# Patient Record
Sex: Female | Born: 1955 | Race: Black or African American | Hispanic: No | Marital: Married | Smoking: Never smoker
Health system: Southern US, Community
[De-identification: ages and names within clinical notes are randomized; demographics above are authoritative.]

## PROBLEM LIST (undated history)

## (undated) DIAGNOSIS — K589 Irritable bowel syndrome without diarrhea: Secondary | ICD-10-CM

## (undated) DIAGNOSIS — M1990 Unspecified osteoarthritis, unspecified site: Secondary | ICD-10-CM

## (undated) DIAGNOSIS — E785 Hyperlipidemia, unspecified: Secondary | ICD-10-CM

## (undated) DIAGNOSIS — K8020 Calculus of gallbladder without cholecystitis without obstruction: Secondary | ICD-10-CM

## (undated) DIAGNOSIS — Z9889 Other specified postprocedural states: Secondary | ICD-10-CM

## (undated) DIAGNOSIS — R519 Headache, unspecified: Secondary | ICD-10-CM

## (undated) DIAGNOSIS — M8580 Other specified disorders of bone density and structure, unspecified site: Secondary | ICD-10-CM

## (undated) DIAGNOSIS — E039 Hypothyroidism, unspecified: Secondary | ICD-10-CM

## (undated) DIAGNOSIS — K635 Polyp of colon: Secondary | ICD-10-CM

## (undated) DIAGNOSIS — F329 Major depressive disorder, single episode, unspecified: Secondary | ICD-10-CM

## (undated) DIAGNOSIS — C50919 Malignant neoplasm of unspecified site of unspecified female breast: Secondary | ICD-10-CM

## (undated) DIAGNOSIS — E669 Obesity, unspecified: Secondary | ICD-10-CM

## (undated) DIAGNOSIS — R42 Dizziness and giddiness: Secondary | ICD-10-CM

## (undated) DIAGNOSIS — K224 Dyskinesia of esophagus: Secondary | ICD-10-CM

## (undated) DIAGNOSIS — M7070 Other bursitis of hip, unspecified hip: Secondary | ICD-10-CM

## (undated) DIAGNOSIS — G629 Polyneuropathy, unspecified: Secondary | ICD-10-CM

## (undated) DIAGNOSIS — R112 Nausea with vomiting, unspecified: Secondary | ICD-10-CM

## (undated) DIAGNOSIS — D649 Anemia, unspecified: Secondary | ICD-10-CM

## (undated) DIAGNOSIS — K219 Gastro-esophageal reflux disease without esophagitis: Secondary | ICD-10-CM

## (undated) DIAGNOSIS — F419 Anxiety disorder, unspecified: Secondary | ICD-10-CM

## (undated) DIAGNOSIS — F32A Depression, unspecified: Secondary | ICD-10-CM

## (undated) DIAGNOSIS — G8929 Other chronic pain: Secondary | ICD-10-CM

## (undated) DIAGNOSIS — I10 Essential (primary) hypertension: Secondary | ICD-10-CM

## (undated) DIAGNOSIS — K602 Anal fissure, unspecified: Secondary | ICD-10-CM

## (undated) DIAGNOSIS — R51 Headache: Secondary | ICD-10-CM

## (undated) DIAGNOSIS — M797 Fibromyalgia: Secondary | ICD-10-CM

## (undated) HISTORY — DX: Other bursitis of hip, unspecified hip: M70.70

## (undated) HISTORY — DX: Anemia, unspecified: D64.9

## (undated) HISTORY — DX: Anal fissure, unspecified: K60.2

## (undated) HISTORY — DX: Dyskinesia of esophagus: K22.4

## (undated) HISTORY — DX: Headache, unspecified: R51.9

## (undated) HISTORY — DX: Depression, unspecified: F32.A

## (undated) HISTORY — DX: Major depressive disorder, single episode, unspecified: F32.9

## (undated) HISTORY — DX: Calculus of gallbladder without cholecystitis without obstruction: K80.20

## (undated) HISTORY — DX: Unspecified osteoarthritis, unspecified site: M19.90

## (undated) HISTORY — PX: GASTRIC BYPASS: SHX52

## (undated) HISTORY — DX: Irritable bowel syndrome, unspecified: K58.9

## (undated) HISTORY — DX: Anxiety disorder, unspecified: F41.9

## (undated) HISTORY — PX: CHOLECYSTECTOMY: SHX55

## (undated) HISTORY — DX: Polyneuropathy, unspecified: G62.9

## (undated) HISTORY — DX: Dizziness and giddiness: R42

## (undated) HISTORY — DX: Gastro-esophageal reflux disease without esophagitis: K21.9

## (undated) HISTORY — DX: Headache: R51

## (undated) HISTORY — PX: OTHER SURGICAL HISTORY: SHX169

## (undated) HISTORY — DX: Hyperlipidemia, unspecified: E78.5

## (undated) HISTORY — PX: TUBAL LIGATION: SHX77

## (undated) HISTORY — DX: Other chronic pain: G89.29

## (undated) HISTORY — DX: Essential (primary) hypertension: I10

## (undated) HISTORY — DX: Obesity, unspecified: E66.9

## (undated) HISTORY — DX: Malignant neoplasm of unspecified site of unspecified female breast: C50.919

## (undated) HISTORY — DX: Other specified postprocedural states: Z98.890

## (undated) HISTORY — PX: APPENDECTOMY: SHX54

## (undated) HISTORY — DX: Polyp of colon: K63.5

## (undated) HISTORY — PX: CARPAL TUNNEL RELEASE: SHX101

## (undated) HISTORY — DX: Other specified disorders of bone density and structure, unspecified site: M85.80

---

## 2005-05-02 DIAGNOSIS — Z9889 Other specified postprocedural states: Secondary | ICD-10-CM

## 2005-05-02 HISTORY — DX: Other specified postprocedural states: Z98.890

## 2007-11-27 ENCOUNTER — Encounter: Payer: Self-pay | Admitting: Gastroenterology

## 2008-10-29 ENCOUNTER — Encounter: Admission: RE | Admit: 2008-10-29 | Discharge: 2008-10-29 | Payer: Self-pay

## 2008-10-29 ENCOUNTER — Ambulatory Visit: Payer: Self-pay | Admitting: Oncology

## 2008-11-25 LAB — CBC WITH DIFFERENTIAL (CANCER CENTER ONLY)
BASO%: 0.5 % (ref 0.0–2.0)
EOS%: 2 % (ref 0.0–7.0)
HCT: 36.5 % (ref 34.8–46.6)
LYMPH#: 1.1 10*3/uL (ref 0.9–3.3)
MCHC: 34.5 g/dL (ref 32.0–36.0)
NEUT#: 2.6 10*3/uL (ref 1.5–6.5)
NEUT%: 64.5 % (ref 39.6–80.0)
Platelets: 213 10*3/uL (ref 145–400)
RDW: 11.3 % (ref 10.5–14.6)
WBC: 4 10*3/uL (ref 3.9–10.0)

## 2008-11-25 LAB — MORPHOLOGY - CHCC SATELLITE: Platelet Morphology: NORMAL

## 2008-11-27 ENCOUNTER — Ambulatory Visit: Payer: Self-pay | Admitting: Cardiology

## 2008-11-27 ENCOUNTER — Observation Stay (HOSPITAL_COMMUNITY): Admission: EM | Admit: 2008-11-27 | Discharge: 2008-11-28 | Payer: Self-pay | Admitting: Emergency Medicine

## 2008-11-27 LAB — COMPREHENSIVE METABOLIC PANEL
Alkaline Phosphatase: 83 U/L (ref 39–117)
BUN: 12 mg/dL (ref 6–23)
CO2: 24 mEq/L (ref 19–32)
Creatinine, Ser: 0.76 mg/dL (ref 0.40–1.20)
Glucose, Bld: 155 mg/dL — ABNORMAL HIGH (ref 70–99)
Total Bilirubin: 0.5 mg/dL (ref 0.3–1.2)

## 2008-11-27 LAB — VITAMIN B12: Vitamin B-12: 203 pg/mL — ABNORMAL LOW (ref 211–911)

## 2008-11-27 LAB — HEMOGLOBINOPATHY EVALUATION
Hemoglobin Other: 0 % (ref 0.0–0.0)
Hgb A2 Quant: 2.8 % (ref 2.2–3.2)
Hgb A: 97.2 % (ref 96.8–97.8)
Hgb F Quant: 0 % (ref 0.0–2.0)
Hgb S Quant: 0 % (ref 0.0–0.0)

## 2008-11-27 LAB — IRON AND TIBC
%SAT: 45 % (ref 20–55)
Iron: 144 ug/dL (ref 42–145)
TIBC: 320 ug/dL (ref 250–470)
UIBC: 176 ug/dL

## 2008-11-27 LAB — FERRITIN: Ferritin: 72 ng/mL (ref 10–291)

## 2008-11-28 ENCOUNTER — Encounter (INDEPENDENT_AMBULATORY_CARE_PROVIDER_SITE_OTHER): Payer: Self-pay | Admitting: *Deleted

## 2008-11-28 ENCOUNTER — Encounter (INDEPENDENT_AMBULATORY_CARE_PROVIDER_SITE_OTHER): Payer: Self-pay | Admitting: Internal Medicine

## 2008-11-28 ENCOUNTER — Encounter: Payer: Self-pay | Admitting: Gastroenterology

## 2008-12-11 ENCOUNTER — Ambulatory Visit: Payer: Self-pay | Admitting: Oncology

## 2008-12-16 ENCOUNTER — Encounter: Payer: Self-pay | Admitting: Family Medicine

## 2009-01-07 ENCOUNTER — Ambulatory Visit: Payer: Self-pay | Admitting: Oncology

## 2009-01-21 ENCOUNTER — Ambulatory Visit: Payer: Self-pay | Admitting: Gastroenterology

## 2009-01-21 DIAGNOSIS — E119 Type 2 diabetes mellitus without complications: Secondary | ICD-10-CM | POA: Insufficient documentation

## 2009-01-21 DIAGNOSIS — R1013 Epigastric pain: Secondary | ICD-10-CM

## 2009-01-21 DIAGNOSIS — K5900 Constipation, unspecified: Secondary | ICD-10-CM | POA: Insufficient documentation

## 2009-01-21 DIAGNOSIS — R1310 Dysphagia, unspecified: Secondary | ICD-10-CM | POA: Insufficient documentation

## 2009-01-21 DIAGNOSIS — K3189 Other diseases of stomach and duodenum: Secondary | ICD-10-CM | POA: Insufficient documentation

## 2009-01-23 ENCOUNTER — Ambulatory Visit (HOSPITAL_COMMUNITY): Admission: RE | Admit: 2009-01-23 | Discharge: 2009-01-23 | Payer: Self-pay | Admitting: Gastroenterology

## 2009-01-23 ENCOUNTER — Ambulatory Visit: Payer: Self-pay | Admitting: Gastroenterology

## 2009-01-26 ENCOUNTER — Telehealth: Payer: Self-pay | Admitting: Gastroenterology

## 2009-02-04 ENCOUNTER — Ambulatory Visit (HOSPITAL_COMMUNITY): Admission: RE | Admit: 2009-02-04 | Discharge: 2009-02-04 | Payer: Self-pay | Admitting: Gastroenterology

## 2009-02-06 ENCOUNTER — Ambulatory Visit: Payer: Self-pay | Admitting: Oncology

## 2009-02-18 ENCOUNTER — Ambulatory Visit: Payer: Self-pay | Admitting: Gastroenterology

## 2009-02-18 ENCOUNTER — Telehealth: Payer: Self-pay | Admitting: Gastroenterology

## 2009-02-23 ENCOUNTER — Telehealth: Payer: Self-pay | Admitting: Gastroenterology

## 2009-02-23 ENCOUNTER — Ambulatory Visit (HOSPITAL_COMMUNITY): Admission: RE | Admit: 2009-02-23 | Discharge: 2009-02-23 | Payer: Self-pay | Admitting: Gastroenterology

## 2009-02-24 ENCOUNTER — Encounter: Payer: Self-pay | Admitting: Gastroenterology

## 2009-02-26 ENCOUNTER — Encounter: Admission: RE | Admit: 2009-02-26 | Discharge: 2009-03-24 | Payer: Self-pay | Admitting: Orthopedic Surgery

## 2009-03-02 ENCOUNTER — Telehealth: Payer: Self-pay | Admitting: Gastroenterology

## 2009-03-06 ENCOUNTER — Ambulatory Visit: Payer: Self-pay | Admitting: Oncology

## 2009-03-17 ENCOUNTER — Other Ambulatory Visit: Admission: RE | Admit: 2009-03-17 | Discharge: 2009-03-17 | Payer: Self-pay | Admitting: Gynecology

## 2009-03-17 ENCOUNTER — Ambulatory Visit: Payer: Self-pay | Admitting: Gynecology

## 2009-03-17 ENCOUNTER — Encounter: Payer: Self-pay | Admitting: Gynecology

## 2009-04-01 ENCOUNTER — Ambulatory Visit: Payer: Self-pay | Admitting: Oncology

## 2009-04-29 ENCOUNTER — Ambulatory Visit: Payer: Self-pay | Admitting: Oncology

## 2009-05-05 ENCOUNTER — Encounter: Payer: Self-pay | Admitting: Family Medicine

## 2009-05-05 LAB — CBC WITH DIFFERENTIAL (CANCER CENTER ONLY)
EOS%: 4.3 % (ref 0.0–7.0)
Eosinophils Absolute: 0.3 10*3/uL (ref 0.0–0.5)
LYMPH#: 2.3 10*3/uL (ref 0.9–3.3)
LYMPH%: 37 % (ref 14.0–48.0)
MCH: 30.7 pg (ref 26.0–34.0)
MCHC: 32.7 g/dL (ref 32.0–36.0)
MONO%: 5 % (ref 0.0–13.0)
NEUT%: 53 % (ref 39.6–80.0)
RBC: 4.01 10*6/uL (ref 3.70–5.32)
WBC: 6.3 10*3/uL (ref 3.9–10.0)

## 2009-05-05 LAB — IRON AND TIBC
Iron: 84 ug/dL (ref 42–145)
UIBC: 218 ug/dL

## 2009-05-05 LAB — VITAMIN B12: Vitamin B-12: 334 pg/mL (ref 211–911)

## 2009-05-05 LAB — RETICULOCYTES (CHCC)
ABS Retic: 49 10*3/uL (ref 19.0–186.0)
RBC.: 4.08 MIL/uL (ref 3.87–5.11)

## 2009-05-05 LAB — FERRITIN: Ferritin: 72 ng/mL (ref 10–291)

## 2009-05-07 ENCOUNTER — Ambulatory Visit: Payer: Self-pay | Admitting: Family Medicine

## 2009-05-07 DIAGNOSIS — I10 Essential (primary) hypertension: Secondary | ICD-10-CM | POA: Insufficient documentation

## 2009-05-07 DIAGNOSIS — D6489 Other specified anemias: Secondary | ICD-10-CM | POA: Insufficient documentation

## 2009-05-07 DIAGNOSIS — F331 Major depressive disorder, recurrent, moderate: Secondary | ICD-10-CM | POA: Insufficient documentation

## 2009-05-07 DIAGNOSIS — M129 Arthropathy, unspecified: Secondary | ICD-10-CM | POA: Insufficient documentation

## 2009-05-07 DIAGNOSIS — E785 Hyperlipidemia, unspecified: Secondary | ICD-10-CM | POA: Insufficient documentation

## 2009-05-08 LAB — CONVERTED CEMR LAB
ALT: 25 units/L (ref 0–35)
BUN: 13 mg/dL (ref 6–23)
Bilirubin, Direct: 0 mg/dL (ref 0.0–0.3)
CO2: 27 meq/L (ref 19–32)
Calcium: 8.9 mg/dL (ref 8.4–10.5)
Hgb A1c MFr Bld: 6.5 % (ref 4.6–6.5)
LDL Cholesterol: 106 mg/dL — ABNORMAL HIGH (ref 0–99)
Potassium: 4 meq/L (ref 3.5–5.1)
Total CHOL/HDL Ratio: 3
Total Protein: 7.2 g/dL (ref 6.0–8.3)

## 2009-05-21 ENCOUNTER — Ambulatory Visit: Payer: Self-pay | Admitting: Psychiatry

## 2009-05-21 ENCOUNTER — Encounter: Payer: Self-pay | Admitting: Family Medicine

## 2009-05-28 ENCOUNTER — Ambulatory Visit: Payer: Self-pay | Admitting: Psychiatry

## 2009-05-29 ENCOUNTER — Ambulatory Visit: Payer: Self-pay | Admitting: Oncology

## 2009-06-03 ENCOUNTER — Ambulatory Visit: Payer: Self-pay | Admitting: Family Medicine

## 2009-06-03 DIAGNOSIS — L851 Acquired keratosis [keratoderma] palmaris et plantaris: Secondary | ICD-10-CM | POA: Insufficient documentation

## 2009-06-08 ENCOUNTER — Telehealth: Payer: Self-pay | Admitting: Family Medicine

## 2009-06-09 ENCOUNTER — Ambulatory Visit: Payer: Self-pay | Admitting: Psychiatry

## 2009-06-09 ENCOUNTER — Telehealth: Payer: Self-pay | Admitting: Family Medicine

## 2009-06-18 ENCOUNTER — Telehealth: Payer: Self-pay | Admitting: Family Medicine

## 2009-06-19 ENCOUNTER — Encounter: Payer: Self-pay | Admitting: Family Medicine

## 2009-06-23 ENCOUNTER — Ambulatory Visit: Payer: Self-pay | Admitting: Psychiatry

## 2009-06-26 ENCOUNTER — Ambulatory Visit: Payer: Self-pay | Admitting: Oncology

## 2009-06-30 ENCOUNTER — Ambulatory Visit: Payer: Self-pay | Admitting: Gynecology

## 2009-07-08 ENCOUNTER — Ambulatory Visit: Payer: Self-pay | Admitting: Family Medicine

## 2009-07-29 ENCOUNTER — Ambulatory Visit: Payer: Self-pay | Admitting: Oncology

## 2009-08-06 ENCOUNTER — Ambulatory Visit: Payer: Self-pay | Admitting: Psychiatry

## 2009-08-10 ENCOUNTER — Ambulatory Visit: Payer: Self-pay | Admitting: Family Medicine

## 2009-08-24 ENCOUNTER — Ambulatory Visit: Payer: Self-pay | Admitting: Oncology

## 2009-09-03 ENCOUNTER — Telehealth: Payer: Self-pay | Admitting: Family Medicine

## 2009-09-17 ENCOUNTER — Telehealth: Payer: Self-pay | Admitting: Family Medicine

## 2009-09-17 ENCOUNTER — Ambulatory Visit: Payer: Self-pay | Admitting: Family Medicine

## 2009-09-17 DIAGNOSIS — K5289 Other specified noninfective gastroenteritis and colitis: Secondary | ICD-10-CM | POA: Insufficient documentation

## 2009-09-25 ENCOUNTER — Ambulatory Visit: Payer: Self-pay | Admitting: Oncology

## 2009-10-08 ENCOUNTER — Telehealth: Payer: Self-pay | Admitting: Family Medicine

## 2009-10-13 ENCOUNTER — Encounter: Admission: RE | Admit: 2009-10-13 | Discharge: 2009-11-27 | Payer: Self-pay | Admitting: Orthopedic Surgery

## 2009-10-19 ENCOUNTER — Ambulatory Visit: Payer: Self-pay | Admitting: Oncology

## 2009-11-03 LAB — CBC WITH DIFFERENTIAL (CANCER CENTER ONLY)
BASO#: 0 10*3/uL (ref 0.0–0.2)
BASO%: 0.3 % (ref 0.0–2.0)
Eosinophils Absolute: 0.1 10*3/uL (ref 0.0–0.5)
HCT: 38.5 % (ref 34.8–46.6)
HGB: 12.8 g/dL (ref 11.6–15.9)
LYMPH#: 1.8 10*3/uL (ref 0.9–3.3)
LYMPH%: 30 % (ref 14.0–48.0)
MONO#: 0.3 10*3/uL (ref 0.1–0.9)
MONO%: 5.6 % (ref 0.0–13.0)
NEUT#: 3.7 10*3/uL (ref 1.5–6.5)
Platelets: 287 10*3/uL (ref 145–400)

## 2009-11-03 LAB — FERRITIN: Ferritin: 70 ng/mL (ref 10–291)

## 2009-11-03 LAB — IRON AND TIBC
Iron: 116 ug/dL (ref 42–145)
TIBC: 315 ug/dL (ref 250–470)
UIBC: 199 ug/dL

## 2009-11-03 LAB — VITAMIN B12: Vitamin B-12: 811 pg/mL (ref 211–911)

## 2009-11-26 ENCOUNTER — Ambulatory Visit: Payer: Self-pay | Admitting: Oncology

## 2009-11-30 ENCOUNTER — Telehealth: Payer: Self-pay | Admitting: Family Medicine

## 2009-11-30 ENCOUNTER — Ambulatory Visit: Payer: Self-pay | Admitting: Family Medicine

## 2009-12-02 ENCOUNTER — Telehealth: Payer: Self-pay | Admitting: Family Medicine

## 2009-12-07 ENCOUNTER — Ambulatory Visit (HOSPITAL_BASED_OUTPATIENT_CLINIC_OR_DEPARTMENT_OTHER): Admission: RE | Admit: 2009-12-07 | Discharge: 2009-12-07 | Payer: Self-pay | Admitting: Orthopedic Surgery

## 2009-12-16 ENCOUNTER — Encounter: Payer: Self-pay | Admitting: Family Medicine

## 2009-12-23 ENCOUNTER — Encounter: Admission: RE | Admit: 2009-12-23 | Discharge: 2009-12-23 | Payer: Self-pay | Admitting: Family Medicine

## 2009-12-24 ENCOUNTER — Ambulatory Visit: Payer: Self-pay | Admitting: Oncology

## 2010-01-07 ENCOUNTER — Telehealth: Payer: Self-pay | Admitting: Family Medicine

## 2010-01-07 ENCOUNTER — Encounter (INDEPENDENT_AMBULATORY_CARE_PROVIDER_SITE_OTHER): Payer: Self-pay | Admitting: *Deleted

## 2010-01-19 ENCOUNTER — Encounter: Payer: Self-pay | Admitting: Family Medicine

## 2010-01-20 ENCOUNTER — Ambulatory Visit: Payer: Self-pay | Admitting: Oncology

## 2010-02-04 ENCOUNTER — Telehealth: Payer: Self-pay | Admitting: Family Medicine

## 2010-02-18 ENCOUNTER — Ambulatory Visit: Payer: Self-pay | Admitting: Oncology

## 2010-03-02 ENCOUNTER — Ambulatory Visit: Payer: Self-pay | Admitting: Family Medicine

## 2010-03-03 ENCOUNTER — Telehealth: Payer: Self-pay | Admitting: Family Medicine

## 2010-03-03 LAB — CONVERTED CEMR LAB
BUN: 13 mg/dL (ref 6–23)
CO2: 26 meq/L (ref 19–32)
Chloride: 105 meq/L (ref 96–112)
GFR calc non Af Amer: 101.95 mL/min (ref >60)

## 2010-03-15 ENCOUNTER — Telehealth: Payer: Self-pay | Admitting: Family Medicine

## 2010-03-19 ENCOUNTER — Emergency Department (HOSPITAL_COMMUNITY): Admission: EM | Admit: 2010-03-19 | Discharge: 2010-03-19 | Payer: Self-pay | Admitting: Emergency Medicine

## 2010-03-22 ENCOUNTER — Ambulatory Visit: Payer: Self-pay | Admitting: Gynecology

## 2010-03-22 ENCOUNTER — Ambulatory Visit: Payer: Self-pay | Admitting: Oncology

## 2010-03-22 ENCOUNTER — Other Ambulatory Visit
Admission: RE | Admit: 2010-03-22 | Discharge: 2010-03-22 | Payer: Self-pay | Source: Home / Self Care | Admitting: Gynecology

## 2010-04-12 ENCOUNTER — Telehealth: Payer: Self-pay | Admitting: Family Medicine

## 2010-05-12 ENCOUNTER — Ambulatory Visit
Admission: RE | Admit: 2010-05-12 | Discharge: 2010-05-12 | Payer: Self-pay | Source: Home / Self Care | Attending: Family Medicine | Admitting: Family Medicine

## 2010-05-12 ENCOUNTER — Telehealth: Payer: Self-pay | Admitting: Family Medicine

## 2010-05-12 ENCOUNTER — Encounter (INDEPENDENT_AMBULATORY_CARE_PROVIDER_SITE_OTHER): Payer: Self-pay | Admitting: *Deleted

## 2010-05-12 DIAGNOSIS — M25519 Pain in unspecified shoulder: Secondary | ICD-10-CM | POA: Insufficient documentation

## 2010-05-14 ENCOUNTER — Ambulatory Visit: Payer: Self-pay | Admitting: Oncology

## 2010-05-14 ENCOUNTER — Telehealth: Payer: Self-pay | Admitting: Family Medicine

## 2010-05-18 ENCOUNTER — Encounter: Payer: Self-pay | Admitting: Family Medicine

## 2010-05-18 ENCOUNTER — Telehealth: Payer: Self-pay | Admitting: Family Medicine

## 2010-05-18 LAB — CBC WITH DIFFERENTIAL/PLATELET
BASO%: 0.4 % (ref 0.0–2.0)
Basophils Absolute: 0 10*3/uL (ref 0.0–0.1)
EOS%: 0.9 % (ref 0.0–7.0)
Eosinophils Absolute: 0.1 10*3/uL (ref 0.0–0.5)
HCT: 40.4 % (ref 34.8–46.6)
HGB: 13.3 g/dL (ref 11.6–15.9)
LYMPH%: 38.4 % (ref 14.0–49.7)
MCH: 31.7 pg (ref 25.1–34.0)
MCHC: 32.9 g/dL (ref 31.5–36.0)
MCV: 96.5 fL (ref 79.5–101.0)
MONO#: 0.5 10*3/uL (ref 0.1–0.9)
MONO%: 6.6 % (ref 0.0–14.0)
NEUT#: 4.1 10*3/uL (ref 1.5–6.5)
NEUT%: 53.7 % (ref 38.4–76.8)
Platelets: 260 10*3/uL (ref 145–400)
RBC: 4.18 10*6/uL (ref 3.70–5.45)
RDW: 13.4 % (ref 11.2–14.5)
WBC: 7.7 10*3/uL (ref 3.9–10.3)
lymph#: 3 10*3/uL (ref 0.9–3.3)

## 2010-05-18 LAB — VITAMIN B12: Vitamin B-12: 549 pg/mL (ref 211–911)

## 2010-05-18 LAB — IRON AND TIBC
%SAT: 44 % (ref 20–55)
Iron: 145 ug/dL (ref 42–145)
TIBC: 329 ug/dL (ref 250–470)
UIBC: 184 ug/dL

## 2010-05-18 LAB — FERRITIN: Ferritin: 39 ng/mL (ref 10–291)

## 2010-05-19 ENCOUNTER — Encounter (INDEPENDENT_AMBULATORY_CARE_PROVIDER_SITE_OTHER): Payer: Self-pay | Admitting: *Deleted

## 2010-05-19 LAB — CONVERTED CEMR LAB
Alkaline Phosphatase: 85 units/L (ref 39–117)
BUN: 11 mg/dL (ref 6–23)
CO2: 28 meq/L (ref 19–32)
Creatinine, Ser: 0.83 mg/dL (ref 0.40–1.20)
Indirect Bilirubin: 0.6 mg/dL (ref 0.0–0.9)
Triglycerides: 115 mg/dL (ref <150)

## 2010-05-26 ENCOUNTER — Telehealth: Payer: Self-pay | Admitting: Family Medicine

## 2010-06-01 NOTE — Assessment & Plan Note (Signed)
Summary: STOMACH PAIN, DIARRHEA   Vital Signs:  Patient profile:   55 year old female Height:      66 inches Weight:      228.25 pounds BMI:     36.97 Temp:     99.0 degrees F oral Pulse rate:   68 / minute Pulse rhythm:   regular BP sitting:   140 / 88  (left arm) Cuff size:   large  Vitals Entered By: Linde Gillis CMA Duncan Dull) (Sep 17, 2009 2:30 PM) CC: stomach pain, diarrhea   History of Present Illness: 55 yo with diarrhea, nausea since Monday. Started out with body aches, low grade fever. Then had some nausea, no vomiting the next morning and has had watery diarrhea since. No blood in stool. Abdominal cramping relieved with defecation, no other abdominal pain. Immodium not helping.  Not sure if she ate anything unusual, [place] the beach [place] to this starting. Diarrhea is getting a little better.  Current Medications (verified): 1)  Trazodone Hcl 100 Mg Tabs (Trazodone Hcl) .Marland Kitchen.. 1 By Mouth Once Daily 2)  Enalapril Maleate 20 Mg Tabs (Enalapril Maleate) .Marland Kitchen.. 1 By Mouth Once Daily 3)  Verapamil Hcl 80 Mg Tabs (Verapamil Hcl) .... Take One By Mouth Once Daily 4)  Furosemide 20 Mg Tabs (Furosemide) .Marland Kitchen.. 1 By Mouth Two Times A Day 5)  Klor-Con 20 Meq Pack (Potassium Chloride) .Marland Kitchen.. 1 By Mouth Once Daily 6)  Xanax 0.5 Mg Tabs (Alprazolam) .Marland Kitchen.. 1 By Mouth Two Times A Day 7)  Aspirin 81 Mg Tbec (Aspirin) .Marland Kitchen.. 1 By Mouth Once Daily 8)  Fexofenadine Hcl 180 Mg Tabs (Fexofenadine Hcl) .... Take One By Mouth Once Daily 9)  Flonase 50 Mcg/act Susp (Fluticasone Propionate) .... One Spray Per Day 10)  Omeprazole 20 Mg Cpdr (Omeprazole) .... Take One By Mouth Two Times A Day 11)  Vitamin D (Ergocalciferol) 50000 Unit Caps (Ergocalciferol) .... Take One By Mouth Every Week 12)  Cyanocobalamin 1000 Mcg/ml Soln (Cyanocobalamin) .... Inject One Ml Im Once A Month 13)  Meclizine Hcl 25 Mg Tabs (Meclizine Hcl) .... Take One By Mouth Three Times A Day As Needed 14)  Amitiza 24 Mcg Caps  (Lubiprostone) .... Take One By Mouth Two Times A Day 15)  Miralax  Powd (Polyethylene Glycol 3350) .... Take One Scoop By Mouth Two Times A Day 16)  Folic Acid 400 Mcg Tabs (Folic Acid) .... Take 3 Tabs By Mouth Once Daily 17)  Proctosol Hc 2.5 % Crea (Hydrocortisone) .... As Needed 18)  Fluocinonide 0.05 % Soln (Fluocinonide) .... Use As Needed 19)  Simvastatin 40 Mg Tabs (Simvastatin) .... Take One By Mouth At Bedtime 20)  Prempro 0.625-2.5 Mg Tabs (Conj Estrog-Medroxyprogest Ace) .... Take One By Mouth At Bedtime 21)  Fish Oil   Oil (Fish Oil) .... Take 1 Capsule By Mouth Once A Day 22)  Calcium Carbonate-Vitamin D 600-400 Mg-Unit  Tabs (Calcium Carbonate-Vitamin D) .... Take 1 Tablet By Mouth Once A Day 23)  Ibuprofen 800 Mg Tabs (Ibuprofen) .... Take 1 Tablet By Mouth Three Times A Day As Needed 24)  Cyclobenzaprine Hcl 10 Mg Tabs (Cyclobenzaprine Hcl) .... Take 1 Tablet By Mouth Three Times A Day As Needed Muscle Spasms 25)  Promethazine Hcl 25 Mg  Tabs (Promethazine Hcl) .Marland Kitchen.. 1 Tab Every 6 Hours As Needed For Nausea.  Allergies: 1)  ! Codeine 2)  ! * Anesthetic 3)  ! * Chicken 4)  ! * Cola 5)  ! *  Corn 6)  ! * Peanuts  Review of Systems      See HPI General:  Complains of chills and fever. GI:  Complains of abdominal pain, diarrhea, and nausea; denies bloody stools and vomiting.  Physical Exam  General:  Well-developed,well-nourished,in no acute distress; alert,appropriate and cooperative throughout examination VS reviewed- normotensive Mouth:  MMM Abdomen:  obese, soft, NT Psych:  normal affect,  pleasant, good eye contact.   Impression & Recommendations:  Problem # 1:  GASTROENTERITIS WITHOUT DEHYDRATION (ICD-558.9) Assessment New Supportive care.  Given phenergan 25 mg IM in office. See pt instructions for details. Her updated medication list for this problem includes:    Meclizine Hcl 25 Mg Tabs (Meclizine hcl) .Marland Kitchen... Take one by mouth three times a day as  needed  Orders: Prescription Created Electronically [phone]) Promethazine up to 50mg  (J2550) Admin of Therapeutic Inj  intramuscular or subcutaneous (60454)  Complete Medication List: 1)  Trazodone Hcl 100 Mg Tabs (Trazodone hcl) .Marland Kitchen.. 1 by mouth once daily 2)  Enalapril Maleate 20 Mg Tabs (Enalapril maleate) .Marland Kitchen.. 1 by mouth once daily 3)  Verapamil Hcl 80 Mg Tabs (Verapamil hcl) .... Take one by mouth once daily 4)  Furosemide 20 Mg Tabs (Furosemide) .Marland Kitchen.. 1 by mouth two times a day 5)  Klor-con 20 Meq Pack (Potassium chloride) .Marland Kitchen.. 1 by mouth once daily 6)  Xanax 0.5 Mg Tabs (Alprazolam) .Marland Kitchen.. 1 by mouth two times a day 7)  Aspirin 81 Mg Tbec (Aspirin) .Marland Kitchen.. 1 by mouth once daily 8)  Fexofenadine Hcl 180 Mg Tabs (Fexofenadine hcl) .... Take one by mouth once daily 9)  Flonase 50 Mcg/act Susp (Fluticasone propionate) .... One spray per day 10)  Omeprazole 20 Mg Cpdr (Omeprazole) .... Take one by mouth two times a day 11)  Vitamin D (ergocalciferol) 50000 Unit Caps (Ergocalciferol) .... Take one by mouth every week 12)  Cyanocobalamin 1000 Mcg/ml Soln (Cyanocobalamin) .... Inject one ml im once a month 13)  Meclizine Hcl 25 Mg Tabs (Meclizine hcl) .... Take one by mouth three times a day as needed 14)  Amitiza 24 Mcg Caps (Lubiprostone) .... Take one by mouth two times a day 15)  Miralax Powd (Polyethylene glycol 3350) .... Take one scoop by mouth two times a day 16)  Folic Acid 400 Mcg Tabs (Folic acid) .... Take 3 tabs by mouth once daily 17)  Proctosol Hc 2.5 % Crea (Hydrocortisone) .... As needed 18)  Fluocinonide 0.05 % Soln (Fluocinonide) .... Use as needed 19)  Simvastatin 40 Mg Tabs (Simvastatin) .... Take one by mouth at bedtime 20)  Prempro 0.625-2.5 Mg Tabs (Conj estrog-medroxyprogest ace) .... Take one by mouth at bedtime 21)  Fish Oil Oil (Fish oil) .... Take 1 capsule by mouth once a day 22)  Calcium Carbonate-vitamin D 600-400 Mg-unit Tabs (Calcium carbonate-vitamin d) ....  Take 1 tablet by mouth once a day 23)  Ibuprofen 800 Mg Tabs (Ibuprofen) .... Take 1 tablet by mouth three times a day as needed 24)  Cyclobenzaprine Hcl 10 Mg Tabs (Cyclobenzaprine hcl) .... Take 1 tablet by mouth three times a day as needed muscle spasms 25)  Promethazine Hcl 25 Mg Tabs (Promethazine hcl) .Marland Kitchen.. 1 tab every 6 hours as needed for nausea.  Patient Instructions: 1)  The main problem with gastroentereritis is dehydration. Drink plenty of fluids and take solids as you feel better. If you are unable to keep anything down and/or you show signs of dehydration( dry cracked lips, lack of tears,  not urinating, very sleepy) , call our office.  2)  Phenergan 25 mg every 6 hours as needed for nausea. Prescriptions: PROMETHAZINE HCL 25 MG  TABS (PROMETHAZINE HCL) 1 tab every 6 hours as needed for nausea.  #30 x 0   Entered and Authorized by:   [name] MD   Signed by:   [name] MD on 09/17/2009   Method used:   [facility]ron[facility] to        [facility] [address] [phone]* (retail)       [address]       [place], [place]  [address]       Ph: [phone] or [phone]       Fax: [phone]   RxID:   [id number]   Current Allergies (reviewed today): ! CODEINE ! * ANESTHETIC ! * CHICKEN ! * COLA ! * CORN ! * PEANUTS   Medication Administration  Injection # 1:    Medication: Promethazine up to 50mg     Diagnosis: GASTROENTERITIS WITHOUT DEHYDRATION (ICD-558.9)    Route: IM    Site: LUOQ gluteus    Exp Date: 04/02/2011    Lot #: [id number]    Mfr: Novaplus    Comments: 25mg  given    Patient tolerated injection without complications    Given by: Linde Gillis CMA Duncan Dull) (Sep 17, 2009 2:53 PM)  Orders Added: 1)  Prescription Created Electronically [G8553] 2)  Est. Patient Level III [44315] 3)  Promethazine up to 50mg  [J2550] 4)  Admin of Therapeutic Inj  intramuscular or subcutaneous [40086]

## 2010-06-01 NOTE — Consult Note (Signed)
Summary: [facility]  [facility]   Imported By: Lanelle Bal 12/22/2009 08:55:55  _____________________________________________________________________  External Attachment:    Type:   Image     Comment:   External Document

## 2010-06-01 NOTE — Progress Notes (Signed)
Summary: Rx Proctosol-HC 2.5% cream  Phone Note Refill Request Call back at [phone] Message from:  [facility] [place] on October 08, 2009 10:29 AM  Refills Requested: Medication #1:  PROCTOSOL HC 2.5 % CREA as needed   Last Refilled: 09/03/2009 Received E-Scriprt request please advise.   Method Requested: Electronic Initial call taken by: Linde Gillis CMA Duncan Dull),  October 08, 2009 10:30 AM    Prescriptions: PROCTOSOL HC 2.5 % CREA (HYDROCORTISONE) as needed  #28.35 gm x 0   Entered and Authorized by:   [name] MD   Signed by:   [name] MD on 10/08/2009   Method used:   Electronically to        [facility] [address] [phone]* (retail)       [address]       [place], [place]  [address]       Ph: [phone] or [phone]       Fax: [phone]   RxID:   [id number]

## 2010-06-01 NOTE — Assessment & Plan Note (Signed)
Summary: NEW PT TO EST/CLE  R/S FROM 05/05/09   Vital Signs:  Patient profile:   55 year old female Height:      66 inches Weight:      227.25 pounds BMI:     36.81 Temp:     98.6 degrees F oral Pulse rate:   76 / minute Pulse rhythm:   regular BP sitting:   142 / 84  (left arm) Cuff size:   large  Vitals Entered By: [name]) (May 07, 2009 8:12 AM) CC: New Patient to Establish   History of Present Illness: 55 yo here to establish care.  DM- on her own sliding scale of Novolog.  Takes 10 unitis if CBG > 200, 15 Units if CBG >300.  Used to be on oral medication but stopped taking it after she lost 100 pounds s/p Gastric Bypass in 2006.  Last needed to give her self insulin two nights ago.  Checks her CBGs multiple times a day, ranges from 130s-300s.  She is unsure when she last had an a1c.  HTN- On Enalapril 20 mg daily, Verapamil 80 mg daily and Lasic 20 mg two times a day (with Klor con 20 meq daily).  BP elevated today because she was anxious about this appt.  She checks it at home, ranging in 120s-130s/70s-80s (was 130/90 at last GI visit).  LE edema- takes the Lasix daily for LE edema.  Went to ER in 10/2008 for CP.  Ruled out for MI, Cardiac cath was neg, EF was 55-60%.  She reports no further w/u for her edema.  Has been on Lasix for almost two years at this dosage.  (awaiting records from [facility]).  Anemia- she is unsure of type of anemia. ? if related to Gastric Bypass/IF/B12.  Sees Dr. Tanna Furry for injections. Awaiting records.  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  Profoundly affects her life, cant walk for more than 20 minutes.  Went to ortho [employer]) who did not feel she would benefit from knee replacement surgery.  Low back pain- s/p decompression/lumbar fusion 06/2008 at [facility].  Doing much, much better.  Depression/anxiety- constantly worried about her weight.  Gets very depressed that she did not loose  more than 100 pounds.  No SI or HI.  Feels she is too concerned about food.  Can't exercise b/c of back and knee pain.  Allergies: 1)  ! Codeine 2)  ! * Anesthetic 3)  ! * Chicken 4)  ! * Cola 5)  ! * Corn 6)  ! * Peanuts  Past History:  Past Medical History: Last updated: 01/21/2009 Anxiety Disorder Diabetes GERD Hyperlipidemia Hypertension anal fissure Arthritis Chronic Headaches colon polyps Depression Gallstones Irritable Bowel Syndrome Obesity  Past Surgical History: Last updated: 01/21/2009 status post cardiac catherization Appendectomy Cholecystectomy Gastric Bypass lower back surgery  Family History: Reviewed history from 01/21/2009 and no changes required. Family History of Diabetes: mother, brother, maternal grandmother, uncles, aunts No FH of Colon Cancer:  Social History: Patient has never smoked.  Alcohol Use - no Illicit Drug Use - no Disabled, moved here from [place] to be closer to her kids, both live in [place].  Review of Systems      See HPI General:  Denies weakness. Eyes:  Denies blurring. CV:  Denies chest pain or discomfort, difficulty breathing at night, and difficulty breathing while lying down. Resp:  Denies cough. GI:  Denies abdominal pain, bloody stools, and change in bowel habits. MS:  Denies joint pain, joint redness, and joint swelling. Derm:  Denies rash. Neuro:  Denies numbness. Psych:  Complains of anxiety, depression, and easily tearful; denies easily angered, panic attacks, sense of great danger, suicidal thoughts/plans, thoughts of violence, unusual visions or sounds, and thoughts /plans of harming others. Heme:  Denies abnormal bruising, bleeding, fevers, and skin discoloration.  Physical Exam  General:  Well-developed,well-nourished,in no acute distress; alert,appropriate and cooperative throughout examination Eyes:  No corneal or conjunctival inflammation noted. EOMI. Perrla. Funduscopic exam benign, without  hemorrhages, exudates or papilledema. Vision grossly normal. Ears:  External ear exam shows no significant lesions or deformities.  Otoscopic examination reveals clear canals, tympanic membranes are intact bilaterally without bulging, retraction, inflammation or discharge. Hearing is grossly normal bilaterally. Mouth:  Oral mucosa and oropharynx without lesions or exudates.  Teeth in good repair. Lungs:  Normal respiratory effort, chest expands symmetrically. Lungs are clear to auscultation, no crackles or wheezes. Heart:  Normal rate and regular rhythm. S1 and S2 normal without gallop, murmur, click, rub or other extra sounds. Abdomen:  obese, soft, NT Msk:  bilateral knees with effusions, no redness, no warmth, limited ranged of motion, walks with cane. Skin:  Intact without suspicious lesions or rashes Psych:  tearful when talks about weight, pleasant, good eye contact.   Impression & Recommendations:  Problem # 1:  ADJ DISORDER WITH MIXED ANXIETY & DEPRESSED MOOD (ICD-309.28) Assessment New Will refer to psych as she would benefit from talking about these issues.  She is very frustrated about not loosing more weight and gets extremely anxious and depresssed.   Psychiatric Referral (Psych)  Problem # 2:  ESSENTIAL HYPERTENSION, BENIGN (ICD-401.1) Assessment: Deteriorated Most likely elevated due to anxiety about this appointment.  I do not like that she is on high doses of Lasix daily for a long period of time.  Will check a BMET and await records from [facility].  Would like to hold lasix if possible. Her updated medication list for this problem includes:    Enalapril Maleate 20 Mg Tabs (Enalapril maleate) .Marland Kitchen... 1 by mouth once daily    Verapamil Hcl 80 Mg Tabs (Verapamil hcl) .Marland Kitchen... Take one by mouth once daily    Furosemide 20 Mg Tabs (Furosemide) .Marland Kitchen... 1 by mouth two times a day  Orders: Venipuncture (16109) TLB-BMP (Basic Metabolic Panel-BMET) (80048-METABOL)  Problem # 3:  DM  (ICD-250.00) Time spent with patient 60  minutes, more than 50% of this time was spent counseling patient on diabetes. Unsure why she is on a sliding scale insulin that she uses only sporadically and not on any oral medications like Metformin which may also have added benefit of weight loss.   Will check a1c today but I would like to stop the insulin mainly because she becomes very consumed with checking her blood sugars several times a day.  Continue Enalapril for kidney protection.  Her updated medication list for this problem includes:    Novolog 100 Unit/ml Soln (Insulin aspart) ..... Sliding scale    Enalapril Maleate 20 Mg Tabs (Enalapril maleate) .Marland Kitchen... 1 by mouth once daily    Aspirin 81 Mg Tbec (Aspirin) .Marland Kitchen... 1 by mouth once daily  Orders: Venipuncture (60454) TLB-A1C / Hgb A1C (Glycohemoglobin) (83036-A1C)  Problem # 4:  HYPERLIPIDEMIA (ICD-272.4) Assessment: Unchanged Check FLP, Hepatic panel today. Her updated medication list for this problem includes:    Simvastatin 40 Mg Tabs (Simvastatin) .Marland Kitchen... Take one  by mouth at bedtime  Orders: Venipuncture (45809) TLB-Hepatic/Liver Function Pnl (80076-HEPATIC) TLB-Lipid Panel (80061-LIPID)  Problem # 5:  OTHER SPECIFIED ANEMIAS (ICD-285.8) Assessment: Unchanged Unknown type. ? pernicious anemia or other type related to gastric bypass.  Awaiting records from Dr. Park Breed.   Her updated medication list for this problem includes:    Cyanocobalamin 1000 Mcg/ml Soln (Cyanocobalamin) ..... Inject one ml im once a month    Folic Acid 400 Mcg Tabs (Folic acid) .Marland Kitchen... 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  It is really impacting her QOL at this point.  Would like for her to have knee replacement if warranted. Orders: Orthopedic Surgeon Referral (Ortho Surgeon)  Problem # 7:  Preventive Health Care (ICD-V70.0) Reviewed  preventive care protocols, scheduled due services, and updated immunizations Discussed nutrition, exercise, diet, and healthy lifestyle.  UTD on all except FLP, BMET.    Complete Medication List: 1)  Novolog 100 Unit/ml Soln (Insulin aspart) .... Sliding scale 2)  Trazodone Hcl 100 Mg Tabs (Trazodone hcl) .Marland Kitchen.. 1 by mouth once daily 3)  Enalapril Maleate 20 Mg Tabs (Enalapril maleate) .Marland Kitchen.. 1 by mouth once daily 4)  Verapamil Hcl 80 Mg Tabs (Verapamil hcl) .... Take one by mouth once daily 5)  Furosemide 20 Mg Tabs (Furosemide) .Marland Kitchen.. 1 by mouth two times a day 6)  Klor-con 20 Meq Pack (Potassium chloride) .Marland Kitchen.. 1 by mouth once daily 7)  Daily Vitamins Tabs (Multiple vitamin) .Marland Kitchen.. 1 by mouth once daily 8)  Xanax 0.5 Mg Tabs (Alprazolam) .Marland Kitchen.. 1 by mouth two times a day 9)  Aspirin 81 Mg Tbec (Aspirin) .Marland Kitchen.. 1 by mouth once daily 10)  Fexofenadine Hcl 180 Mg Tabs (Fexofenadine hcl) .... Take one by mouth once daily 11)  Flonase 50 Mcg/act Susp (Fluticasone propionate) .... One spray per day 12)  Omeprazole 20 Mg Cpdr (Omeprazole) .... Take one by mouth two times a day 13)  Mobic 15 Mg Tabs (Meloxicam) .... Take one by mouth once daily 14)  Vitamin D (ergocalciferol) 50000 Unit Caps (Ergocalciferol) .... Take one by mouth every week 15)  Cyanocobalamin 1000 Mcg/ml Soln (Cyanocobalamin) .... Inject one ml im once a month 16)  Meclizine Hcl 25 Mg Tabs (Meclizine hcl) .... Take one by mouth three times a day as needed 17)  Amitiza 24 Mcg Caps (Lubiprostone) .... Take one by mouth two times a day 18)  Miralax Powd (Polyethylene glycol 3350) .... Take one scoop by mouth two times a day 19)  Folic Acid 400 Mcg Tabs (Folic acid) .... Take 3 tabs by mouth once daily 20)  Proctosol Hc 2.5 % Crea (Hydrocortisone) .... As needed 21)  Fluocinonide 0.05 % Soln (Fluocinonide) .... Use as needed 22)  Simvastatin 40 Mg Tabs (Simvastatin) .... Take one by mouth at bedtime 23)  Prempro 0.625-2.5 Mg Tabs (Conj  estrog-medroxyprogest ace) .... Take one by mouth at bedtime  Patient Instructions: 1)  Nice to meet you, Kayla Mayo. 2)  Please stop by to see Shirlee Limerick on your way out to set up your referrals. Prescriptions: XANAX 0.5 MG TABS (ALPRAZOLAM) 1 by mouth two times a day  #60 x 0   Entered and Authorized by:   [name] MD   Signed by:   [name] MD on 05/07/2009   Method used:   Print then Give to Patient   RxID:   [id number] FEXOFENADINE HCL 180  MG TABS (FEXOFENADINE HCL) take one by mouth once daily  #30 x 0   Entered and Authorized by:   [name] MD   Signed by:   [name] MD on 05/07/2009   Method used:   Electronically to        [facility] [address] [phone]* (retail)       [address]       [place], [place]  [address]       Ph: [phone] or [phone]       Fax: [phone]   RxID:   [id number] OMEPRAZOLE 20 MG CPDR (OMEPRAZOLE) take one by mouth two times a day  #60 x 3   Entered and Authorized by:   [name] MD   Signed by:   [name] MD on 05/07/2009   Method used:   Electronically to        [facility] [address] [phone]* (retail)       [address]       [place], [place]  [address]       Ph: [phone] or [phone]       Fax: [phone]   RxID:   [id number] VITAMIN D (ERGOCALCIFEROL) 50000 UNIT CAPS (ERGOCALCIFEROL) take one by mouth every week  #4 x 2   Entered and Authorized by:   [name] MD   Signed by:   [name] MD on 05/07/2009   Method used:   Electronically to        [facility] [address] [phone]* (retail)       [address]       [place], [place]  [address]       Ph: [phone] or [phone]       Fax: [phone]   RxID:   [id number] PROCTOSOL HC 2.5 % CREA (HYDROCORTISONE) as needed  #28.35 gm x 0   Entered and Authorized by:   [name] MD   Signed by:   [name] MD on 05/07/2009   Method used:   Electronically to        [facility]  [facility] [address] [phone]* (retail)       [address]       [place], [place]  [address]       Ph: [phone] or [phone]       Fax: [phone]   RxID:   [id number] MIRALAX  POWD (POLYETHYLENE GLYCOL 3350) take one scoop by mouth two times a day  #527 gm x 0   Entered and Authorized by:   [name] MD   Signed by:   [name] MD on 05/07/2009   Method used:   Electronically to        [facility]  [facility] [address] [phone]* (retail)       [address]       [place], [place]  [address]       Ph: [phone] or [phone]       Fax: [phone]   RxID:   [id number] OMEPRAZOLE 20 MG CPDR (OMEPRAZOLE) take one by mouth two times a day  #60 x 3   Entered and Authorized by:   [name] MD   Signed by:   [name] MD on 05/07/2009   Method used:  Electronically to        [facility]  [facility] [address] [phone]* (retail)       [address]       [place], [place]  [address]       Ph: [phone] or [phone]       Fax: [phone]   RxID:   [id number] FEXOFENADINE HCL 180 MG TABS (FEXOFENADINE HCL) take one by mouth once daily  #30 x 0   Entered and Authorized by:   [name] MD   Signed by:   [name] MD on 05/07/2009   Method used:   Electronically to        [facility]  [facility] [address] [phone]* (retail)       [address]       [place], [place]  [address]       Ph: [phone] or [phone]       Fax: [phone]   RxID:   [id number] VITAMIN D (ERGOCALCIFEROL) 50000 UNIT CAPS (ERGOCALCIFEROL) take one by mouth every week  #4 x 2   Entered and Authorized by:   [name] MD   Signed by:   [name] MD on 05/07/2009   Method used:   Electronically to        [facility]  [facility] [address] [phone]* (retail)       [address]       [place], [place]  [address]       Ph: [phone] or [phone]       Fax: [phone]   RxID:   [id number]   Current Allergies  (reviewed today): ! CODEINE ! * ANESTHETIC ! * CHICKEN ! * COLA ! * CORN ! * PEANUTS  TD Result Date:  07/03/2008 TD Result:  historical TD Next Due:  10 yr Flex Sig Next Due:  Not Indicated Colonoscopy Result Date:  10/17/2007 Colonoscopy Result:  polyps Hemoccult Next Due:  Not Indicated PAP Result Date:  03/06/2008 PAP Result:  normal PAP Next Due:  3 yr Mammogram Result Date:  10/16/2008 Mammogram Result:  normal Mammogram Next Due:  1 yr pneumovax 06/2008

## 2010-06-01 NOTE — Assessment & Plan Note (Signed)
Summary: ROA 1 MTHS CYD   Vital Signs:  Patient profile:   55 year old female Height:      66 inches Weight:      228.13 pounds BMI:     36.95 Temp:     99 degrees F oral Pulse rate:   88 / minute Pulse rhythm:   regular BP sitting:   118 / 76  (left arm) Cuff size:   large  Vitals Entered By: [name]) (July 08, 2009 11:57 AM) CC: 1 month follow up   History of Present Illness: 55 yo here for follow up depression/ anxiety.  Depression/anxiety- constantly worried about her weight.  Gets very depressed that she did not loose more than 100 pounds.  Has tearful episodes, lack of energy, diffculty concentrating.  No SI or HI.  Still seeing  Dr. Elyn Aquas once a week, feels she is really helping.  We tried Wellbutrin at last office visit.  Has been taking 150 mg daily.  Feels it has not helped at all and having a difficult time sleeping even though she is taking it in the morning.  Amaryah still has symptoms predominantly of anxiety, feels her depression is much better.  Gets anxious about everything.  Gives example today that she was nervous about coming here today for no reason, was nervous when her phone rang last night.  Has previously been on Prozac, Paxil, Zoloft and Cymbalta but felt they all made her feel funny or cause weight gain.   .  Current Medications (verified): 1)  Trazodone Hcl 100 Mg Tabs (Trazodone Hcl) .Marland Kitchen.. 1 By Mouth Once Daily 2)  Enalapril Maleate 20 Mg Tabs (Enalapril Maleate) .Marland Kitchen.. 1 By Mouth Once Daily 3)  Verapamil Hcl 80 Mg Tabs (Verapamil Hcl) .... Take One By Mouth Once Daily 4)  Furosemide 20 Mg Tabs (Furosemide) .Marland Kitchen.. 1 By Mouth Two Times A Day 5)  Klor-Con 20 Meq Pack (Potassium Chloride) .Marland Kitchen.. 1 By Mouth Once Daily 6)  Daily Vitamins  Tabs (Multiple Vitamin) .Marland Kitchen.. 1 By Mouth Once Daily 7)  Xanax 0.5 Mg Tabs (Alprazolam) .Marland Kitchen.. 1 By Mouth Two Times A Day 8)  Aspirin 81 Mg Tbec (Aspirin) .Marland Kitchen.. 1 By Mouth Once Daily 9)  Fexofenadine Hcl 180 Mg  Tabs (Fexofenadine Hcl) .... Take One By Mouth Once Daily 10)  Flonase 50 Mcg/act Susp (Fluticasone Propionate) .... One Spray Per Day 11)  Omeprazole 20 Mg Cpdr (Omeprazole) .... Take One By Mouth Two Times A Day 12)  Mobic 15 Mg Tabs (Meloxicam) .... Take One By Mouth Once Daily 13)  Vitamin D (Ergocalciferol) 50000 Unit Caps (Ergocalciferol) .... Take One By Mouth Every Week 14)  Cyanocobalamin 1000 Mcg/ml Soln (Cyanocobalamin) .... Inject One Ml Im Once A Month 15)  Meclizine Hcl 25 Mg Tabs (Meclizine Hcl) .... Take One By Mouth Three Times A Day As Needed 16)  Amitiza 24 Mcg Caps (Lubiprostone) .... Take One By Mouth Two Times A Day 17)  Miralax  Powd (Polyethylene Glycol 3350) .... Take One Scoop By Mouth Two Times A Day 18)  Folic Acid 400 Mcg Tabs (Folic Acid) .... Take 3 Tabs By Mouth Once Daily 19)  Proctosol Hc 2.5 % Crea (Hydrocortisone) .... As Needed 20)  Fluocinonide 0.05 % Soln (Fluocinonide) .... Use As Needed 21)  Simvastatin 40 Mg Tabs (Simvastatin) .... Take One By Mouth At Bedtime 22)  Prempro 0.625-2.5 Mg Tabs (Conj Estrog-Medroxyprogest Ace) .... Take One By Mouth At Bedtime 23)  Paroxetine Hcl 20 Mg  Tabs (Paroxetine Hcl) .... One By Mouth Every Morning.  Allergies: 1)  ! Codeine 2)  ! * Anesthetic 3)  ! * Chicken 4)  ! * Cola 5)  ! * Corn 6)  ! * Peanuts  Review of Systems      See HPI Psych:  Complains of anxiety and panic attacks; denies depression, easily angered, easily tearful, irritability, mental problems, sense of great danger, suicidal thoughts/plans, thoughts of violence, unusual visions or sounds, and thoughts /plans of harming others.  Physical Exam  General:  Well-developed,well-nourished,in no acute distress; alert,appropriate and cooperative throughout examination VS reviewed- normotensive Psych:  normal affect,  pleasant, good eye contact.   Impression & Recommendations:  Problem # 1:  ADJ DISORDER WITH MIXED ANXIETY & DEPRESSED MOOD  (ICD-309.28) Assessment Unchanged  Time spent with patient 25 minutes, more than 50% of this time was spent counseling patient on anxiety/depression. Primarily an anxiety issue at this point.  Discussed options, continue therapy. She wants to try Paxil again after we talked about its indictions for GAD. Given up to date handout about side effects, advised NOT to stop suddenly. She will follow up with me again in one month.  Orders: Prescription Created Electronically [phone])  Complete Medication List: 1)  Trazodone Hcl 100 Mg Tabs (Trazodone hcl) .Marland Kitchen.. 1 by mouth once daily 2)  Enalapril Maleate 20 Mg Tabs (Enalapril maleate) .Marland Kitchen.. 1 by mouth once daily 3)  Verapamil Hcl 80 Mg Tabs (Verapamil hcl) .... Take one by mouth once daily 4)  Furosemide 20 Mg Tabs (Furosemide) .Marland Kitchen.. 1 by mouth two times a day 5)  Klor-con 20 Meq Pack (Potassium chloride) .Marland Kitchen.. 1 by mouth once daily 6)  Daily Vitamins Tabs (Multiple vitamin) .Marland Kitchen.. 1 by mouth once daily 7)  Xanax 0.5 Mg Tabs (Alprazolam) .Marland Kitchen.. 1 by mouth two times a day 8)  Aspirin 81 Mg Tbec (Aspirin) .Marland Kitchen.. 1 by mouth once daily 9)  Fexofenadine Hcl 180 Mg Tabs (Fexofenadine hcl) .... Take one by mouth once daily 10)  Flonase 50 Mcg/act Susp (Fluticasone propionate) .... One spray per day 11)  Omeprazole 20 Mg Cpdr (Omeprazole) .... Take one by mouth two times a day 12)  Mobic 15 Mg Tabs (Meloxicam) .... Take one by mouth once daily 13)  Vitamin D (ergocalciferol) 50000 Unit Caps (Ergocalciferol) .... Take one by mouth every week 14)  Cyanocobalamin 1000 Mcg/ml Soln (Cyanocobalamin) .... Inject one ml im once a month 15)  Meclizine Hcl 25 Mg Tabs (Meclizine hcl) .... Take one by mouth three times a day as needed 16)  Amitiza 24 Mcg Caps (Lubiprostone) .... Take one by mouth two times a day 17)  Miralax Powd (Polyethylene glycol 3350) .... Take one scoop by mouth two times a day 18)  Folic Acid 400 Mcg Tabs (Folic acid) .... Take 3 tabs by mouth once  daily 19)  Proctosol Hc 2.5 % Crea (Hydrocortisone) .... As needed 20)  Fluocinonide 0.05 % Soln (Fluocinonide) .... Use as needed 21)  Simvastatin 40 Mg Tabs (Simvastatin) .... Take one by mouth at bedtime 22)  Prempro 0.625-2.5 Mg Tabs (Conj estrog-medroxyprogest ace) .... Take one by mouth at bedtime 23)  Paroxetine Hcl 20 Mg Tabs (Paroxetine hcl) .... One by mouth every morning. Prescriptions: MIRALAX  POWD (POLYETHYLENE GLYCOL 3350) take one scoop by mouth two times a day  #527 gm x 0   Entered and Authorized by:   [name] MD   Signed by:  [name] MD on 07/08/2009   Method used:   [facility]        [facility] [address] [phone]* (retail)       [address]       [place], [place]  [address]       Ph: [phone] or [phone]       Fax: [phone]   RxID:   [id number] Prudy Feeler 0.5 MG TABS (ALPRAZOLAM) 1 by mouth two times a day  #60 x 0   Entered and Authorized by:   [name] MD   Signed by:   [name] MD on 07/08/2009   Method used:   Print then Give to Patient   RxID:   [id number] PAROXETINE HCL 20 MG  TABS (PAROXETINE HCL) one by mouth every morning.  #30 x 0   Entered and Authorized by:   [name] MD   Signed by:   [name] MD on 07/08/2009   Method used:   Electronically to        [facility] [address] [phone]* (retail)       [address]       [place], [place]  [address]       Ph: [phone] or [phone]       Fax: [phone]   RxID:   [id number]   Current Allergies (reviewed today): ! CODEINE ! * ANESTHETIC ! * CHICKEN ! * COLA ! * CORN ! * PEANUTS

## 2010-06-01 NOTE — Assessment & Plan Note (Signed)
Summary: 1 MONTH FOLLOWU P/RBH   Vital Signs:  Patient profile:   55 year old female Height:      66 inches Weight:      228.25 pounds BMI:     36.97 Temp:     98.6 degrees F oral Pulse rate:   84 / minute Pulse rhythm:   regular BP sitting:   124 / 80  (left arm) Cuff size:   large  Vitals Entered By: [name]) (August 10, 2009 8:59 AM) CC: 1 month follow up   History of Present Illness: 55 yo here for follow up depression/ anxiety.  Depression/anxiety- started Paxil 20 mg last month, still seeing therapist, Dr. Noe Gens. Weaned herself off of the paxil after taking it for only 3 weeks because she felt like it was actually worsening her energy level.  Therapy has helped more than anything. She does not want to try another SSRI.  She has tools now to deal with her stressors.  For example, she knows her daughter in law is bipolar so has learned to not let her comments affect her at all.  No longer tearful. Now getting out and walking every day.  She is happy for the first time in a long time. Still no SI or HI. Marland Kitchen  Current Medications (verified): 1)  Trazodone Hcl 100 Mg Tabs (Trazodone Hcl) .Marland Kitchen.. 1 By Mouth Once Daily 2)  Enalapril Maleate 20 Mg Tabs (Enalapril Maleate) .Marland Kitchen.. 1 By Mouth Once Daily 3)  Verapamil Hcl 80 Mg Tabs (Verapamil Hcl) .... Take One By Mouth Once Daily 4)  Furosemide 20 Mg Tabs (Furosemide) .Marland Kitchen.. 1 By Mouth Two Times A Day 5)  Klor-Con 20 Meq Pack (Potassium Chloride) .Marland Kitchen.. 1 By Mouth Once Daily 6)  Xanax 0.5 Mg Tabs (Alprazolam) .Marland Kitchen.. 1 By Mouth Two Times A Day 7)  Aspirin 81 Mg Tbec (Aspirin) .Marland Kitchen.. 1 By Mouth Once Daily 8)  Fexofenadine Hcl 180 Mg Tabs (Fexofenadine Hcl) .... Take One By Mouth Once Daily 9)  Flonase 50 Mcg/act Susp (Fluticasone Propionate) .... One Spray Per Day 10)  Omeprazole 20 Mg Cpdr (Omeprazole) .... Take One By Mouth Two Times A Day 11)  Vitamin D (Ergocalciferol) 50000 Unit Caps (Ergocalciferol) .... Take One By Mouth  Every Week 12)  Cyanocobalamin 1000 Mcg/ml Soln (Cyanocobalamin) .... Inject One Ml Im Once A Month 13)  Meclizine Hcl 25 Mg Tabs (Meclizine Hcl) .... Take One By Mouth Three Times A Day As Needed 14)  Amitiza 24 Mcg Caps (Lubiprostone) .... Take One By Mouth Two Times A Day 15)  Miralax  Powd (Polyethylene Glycol 3350) .... Take One Scoop By Mouth Two Times A Day 16)  Folic Acid 400 Mcg Tabs (Folic Acid) .... Take 3 Tabs By Mouth Once Daily 17)  Proctosol Hc 2.5 % Crea (Hydrocortisone) .... As Needed 18)  Fluocinonide 0.05 % Soln (Fluocinonide) .... Use As Needed 19)  Simvastatin 40 Mg Tabs (Simvastatin) .... Take One By Mouth At Bedtime 20)  Prempro 0.625-2.5 Mg Tabs (Conj Estrog-Medroxyprogest Ace) .... Take One By Mouth At Bedtime 21)  Fish Oil   Oil (Fish Oil) .... Take 1 Capsule By Mouth Once A Day 22)  Calcium Carbonate-Vitamin D 600-400 Mg-Unit  Tabs (Calcium Carbonate-Vitamin D) .... Take 1 Tablet By Mouth Once A Day 23)  Ibuprofen 800 Mg Tabs (Ibuprofen) .... Take 1 Tablet By Mouth Three Times A Day As Needed 24)  Cyclobenzaprine Hcl 10 Mg Tabs (Cyclobenzaprine Hcl) .... Take 1 Tablet  By Mouth Three Times A Day As Needed Muscle Spasms  Allergies: 1)  ! Codeine 2)  ! * Anesthetic 3)  ! * Chicken 4)  ! * Cola 5)  ! * Corn 6)  ! * Peanuts  Review of Systems Psych:  Denies anxiety, depression, sense of great danger, suicidal thoughts/plans, thoughts of violence, unusual visions or sounds, and thoughts /plans of harming others.  Physical Exam  General:  Well-developed,well-nourished,in no acute distress; alert,appropriate and cooperative throughout examination VS reviewed- normotensive Psych:  normal affect,  pleasant, good eye contact.   Impression & Recommendations:  Problem # 1:  ADJ DISORDER WITH MIXED ANXIETY & DEPRESSED MOOD (ICD-309.28) Assessment Improved  Much improved. Time spent with patient 25 minutes, more than 50% of this time was spent counseling patient on  anxiety and depression. She truly is doing much better, not interested in starting medications. She will call me in 2 months to let me know how she is doing, sooner if depressive or anxiety symptoms return.  Orders: Prescription Created Electronically [phone])  Complete Medication List: 1)  Trazodone Hcl 100 Mg Tabs (Trazodone hcl) .Marland Kitchen.. 1 by mouth once daily 2)  Enalapril Maleate 20 Mg Tabs (Enalapril maleate) .Marland Kitchen.. 1 by mouth once daily 3)  Verapamil Hcl 80 Mg Tabs (Verapamil hcl) .... Take one by mouth once daily 4)  Furosemide 20 Mg Tabs (Furosemide) .Marland Kitchen.. 1 by mouth two times a day 5)  Klor-con 20 Meq Pack (Potassium chloride) .Marland Kitchen.. 1 by mouth once daily 6)  Xanax 0.5 Mg Tabs (Alprazolam) .Marland Kitchen.. 1 by mouth two times a day 7)  Aspirin 81 Mg Tbec (Aspirin) .Marland Kitchen.. 1 by mouth once daily 8)  Fexofenadine Hcl 180 Mg Tabs (Fexofenadine hcl) .... Take one by mouth once daily 9)  Flonase 50 Mcg/act Susp (Fluticasone propionate) .... One spray per day 10)  Omeprazole 20 Mg Cpdr (Omeprazole) .... Take one by mouth two times a day 11)  Vitamin D (ergocalciferol) 50000 Unit Caps (Ergocalciferol) .... Take one by mouth every week 12)  Cyanocobalamin 1000 Mcg/ml Soln (Cyanocobalamin) .... Inject one ml im once a month 13)  Meclizine Hcl 25 Mg Tabs (Meclizine hcl) .... Take one by mouth three times a day as needed 14)  Amitiza 24 Mcg Caps (Lubiprostone) .... Take one by mouth two times a day 15)  Miralax Powd (Polyethylene glycol 3350) .... Take one scoop by mouth two times a day 16)  Folic Acid 400 Mcg Tabs (Folic acid) .... Take 3 tabs by mouth once daily 17)  Proctosol Hc 2.5 % Crea (Hydrocortisone) .... As needed 18)  Fluocinonide 0.05 % Soln (Fluocinonide) .... Use as needed 19)  Simvastatin 40 Mg Tabs (Simvastatin) .... Take one by mouth at bedtime 20)  Prempro 0.625-2.5 Mg Tabs (Conj estrog-medroxyprogest ace) .... Take one by mouth at bedtime 21)  Fish Oil Oil (Fish oil) .... Take 1 capsule by mouth  once a day 22)  Calcium Carbonate-vitamin D 600-400 Mg-unit Tabs (Calcium carbonate-vitamin d) .... Take 1 tablet by mouth once a day 23)  Ibuprofen 800 Mg Tabs (Ibuprofen) .... Take 1 tablet by mouth three times a day as needed 24)  Cyclobenzaprine Hcl 10 Mg Tabs (Cyclobenzaprine hcl) .... Take 1 tablet by mouth three times a day as needed muscle spasms Prescriptions: IBUPROFEN 800 MG TABS (IBUPROFEN) Take 1 tablet by mouth three times a day as needed  #90 x 3   Entered and Authorized by:   [name] MD   Signed by:  [name] MD on 08/10/2009   Method used:   Electronically to        [facility] [address] [phone]* (retail)       [address]       [place], [place]  [address]       Ph: [phone] or [phone]       Fax: [phone]   RxID:   [id number] CYCLOBENZAPRINE HCL 10 MG TABS (CYCLOBENZAPRINE HCL) Take 1 tablet by mouth three times a day as needed muscle spasms  #90 x 3   Entered and Authorized by:   [name] MD   Signed by:   [name] MD on 08/10/2009   Method used:   Electronically to        [facility] [address] [phone]* (retail)       [address]       [place], [place]  [address]       Ph: [phone] or [phone]       Fax: [phone]   RxID:   [id number]   Current Allergies (reviewed today): ! CODEINE ! * ANESTHETIC ! * CHICKEN ! * COLA ! * CORN ! * PEANUTS

## 2010-06-01 NOTE — Progress Notes (Signed)
Summary: Proctosol Cream  Phone Note Refill Request Message from:  [employer] on Sep 03, 2009 2:34 PM  Refills Requested: Medication #1:  PROCTOSOL HC 2.5 % CREA as needed [facility] [address] [phone]*   Last Fill Date:  No date sent   Pharmacy Phone:  [phone]   Method Requested: Electronic Initial call taken by: [name]),  Sep 03, 2009 2:34 PM    Prescriptions: PROCTOSOL HC 2.5 % CREA (HYDROCORTISONE) as needed  #28.35 gm x 0   Entered and Authorized by:   [name] MD   Signed by:   [name] MD on 09/03/2009   Method used:   Electronically to        [facility] [address] [phone]* (retail)       [address]       [place], [place]  [address]       Ph: [phone] or [phone]       Fax: [phone]   RxID:   [id number]

## 2010-06-01 NOTE — Progress Notes (Signed)
Summary: regarding drug interaction, also clarification on klor con  Phone Note From Pharmacy   Caller: [facility] Summary of Call: Form regarding  interaction between zocor and verapamil is on your desk.  Also, form asking for clarification on klor con is on your desk. Initial call taken by: Lowella Petties CMA,  December 02, 2009 4:06 PM  Follow-up for Phone Call        Please call pt to find out if she is taking Kclor or pot chlor powder.  Also explain that we need to decrease the dose of her cholesterol medication to reduce interaction potential (we received form from her pharmacy).  Have her follow up with me in 3 months. [name] MD  December 03, 2009 7:52 AM  Patient notified via telephone as instructed.  She is ok with the new dose of Simvastatin and she takes Klor-Con daily.  She will call back to schedule 3 month f/u appt. Follow-up by: Linde Gillis CMA Duncan Dull),  December 03, 2009 8:04 AM    New/Updated Medications: SIMVASTATIN 10 MG TABS (SIMVASTATIN) 1 by mouth at bedtime Prescriptions: SIMVASTATIN 10 MG TABS (SIMVASTATIN) 1 by mouth at bedtime  #90 x 3   Entered and Authorized by:   [name] MD   Signed by:   [name] MD on 12/03/2009   Method used:   Electronically to        [facility] [address] [phone]* (retail)       [address]       [place], [place]  [address]       Ph: [phone] or [phone]       Fax: [phone]   RxID:   [id number]

## 2010-06-01 NOTE — Assessment & Plan Note (Signed)
Summary: weight gain/dlo   Vital Signs:  Patient profile:   55 year old female Height:      66 inches Weight:      231.25 pounds BMI:     37.46 Temp:     98.6 degrees F oral Pulse rate:   68 / minute Pulse rhythm:   regular BP sitting:   120 / 82  (left arm) Cuff size:   large  Vitals Entered By: Linde Gillis CMA Duncan Dull) (November 30, 2009 4:05 PM) CC: weight gain   History of Present Illness: 55 yo with known anxiety issues concerning her weight here to discuss weight gain.  She is s/p gastric bypass and has the ultimate fear of gaining all of her weight back.  She just had arthroscopic knee surgery and next week, is having surgery on her other knee.  This is preventing her from exercising regularly and she fears she going to gain more and more weight.  Pt has gained 3 pounds since May.  Admits to snacking on pretzels and chips at night which causes severe anxiety.     No longer seeing her therapist.  Allergies: 1)  ! Codeine 2)  ! * Anesthetic 3)  ! * Chicken 4)  ! * Cola 5)  ! * Corn 6)  ! * Peanuts  Review of Systems      See HPI Psych:  Complains of anxiety and depression; denies mental problems, panic attacks, sense of great danger, suicidal thoughts/plans, thoughts of violence, unusual visions or sounds, and thoughts /plans of harming others.  Physical Exam  General:  Well-developed,well-nourished,in no acute distress; alert,appropriate and cooperative throughout  Psych:  normal affect,  pleasant, good eye contact.   Impression & Recommendations:  Problem # 1:  ADJ DISORDER WITH MIXED ANXIETY & DEPRESSED MOOD (ICD-309.28) Assessment Deteriorated Time spent with patient 25 minutes, more than 50% of this time was spent counseling patient on her symptoms.   Anxiety increased due to feelings of not being in control of her weight (due to exercise restrictions).  We discussed dietary changes and keep a food journal to help her regain sense of control.  She did ask  about diet supplements but explained that given her comorbid conditions, such as HTN and her preoccupation with weight, she is not a good candidate.  Pt agreed with plan.  She will follow up in one month.  Complete Medication List: 1)  Trazodone Hcl 100 Mg Tabs (Trazodone hcl) .Marland Kitchen.. 1 by mouth once daily 2)  Enalapril Maleate 20 Mg Tabs (Enalapril maleate) .Marland Kitchen.. 1 by mouth once daily 3)  Verapamil Hcl 80 Mg Tabs (Verapamil hcl) .... Take one by mouth once daily 4)  Furosemide 20 Mg Tabs (Furosemide) .Marland Kitchen.. 1 by mouth two times a day 5)  Klor-con 20 Meq Pack (Potassium chloride) .Marland Kitchen.. 1 by mouth once daily 6)  Xanax 0.5 Mg Tabs (Alprazolam) .Marland Kitchen.. 1 by mouth two times a day 7)  Aspirin 81 Mg Tbec (Aspirin) .Marland Kitchen.. 1 by mouth once daily 8)  Fexofenadine Hcl 180 Mg Tabs (Fexofenadine hcl) .... Take one by mouth once daily 9)  Flonase 50 Mcg/act Susp (Fluticasone propionate) .... One spray per day 10)  Omeprazole 20 Mg Cpdr (Omeprazole) .... Take one by mouth two times a day 11)  Vitamin D (ergocalciferol) 50000 Unit Caps (Ergocalciferol) .... Take one by mouth every week 12)  Cyanocobalamin 1000 Mcg/ml Soln (Cyanocobalamin) .... Inject one ml im once a month 13)  Meclizine Hcl 25 Mg  Tabs (Meclizine hcl) .... Take one by mouth three times a day as needed 14)  Amitiza 24 Mcg Caps (Lubiprostone) .... Take one by mouth two times a day 15)  Miralax Powd (Polyethylene glycol 3350) .... Take one scoop by mouth two times a day 16)  Folic Acid 400 Mcg Tabs (Folic acid) .... Take 3 tabs by mouth once daily 17)  Proctosol Hc 2.5 % Crea (Hydrocortisone) .... As needed 18)  Fluocinonide 0.05 % Soln (Fluocinonide) .... Use as needed 19)  Simvastatin 40 Mg Tabs (Simvastatin) .... Take one by mouth at bedtime 20)  Prempro 0.625-2.5 Mg Tabs (Conj estrog-medroxyprogest ace) .... Take one by mouth at bedtime 21)  Fish Oil Oil (Fish oil) .... Take 1 capsule by mouth once a day 22)  Calcium Carbonate-vitamin D 600-400 Mg-unit  Tabs (Calcium carbonate-vitamin d) .... Take 1 tablet by mouth once a day 23)  Ibuprofen 800 Mg Tabs (Ibuprofen) .... Take 1 tablet by mouth three times a day as needed 24)  Cyclobenzaprine Hcl 10 Mg Tabs (Cyclobenzaprine hcl) .... Take 1 tablet by mouth three times a day as needed muscle spasms 25)  Promethazine Hcl 25 Mg Tabs (Promethazine hcl) .Marland Kitchen.. 1 tab every 6 hours as needed for nausea. Prescriptions: XANAX 0.5 MG TABS (ALPRAZOLAM) 1 by mouth two times a day  #60 x 0   Entered and Authorized by:   [name] MD   Signed by:   [name] MD on 11/30/2009   Method used:   Telephoned to ...       [facility] [address] [phone]* (retail)       [address]       [place], [place]  [address]       Ph: [phone] or [phone]       Fax: [phone]   RxID:   [id number]   Current Allergies (reviewed today): ! CODEINE ! * ANESTHETIC ! * CHICKEN ! * COLA ! * CORN ! * PEANUTS

## 2010-06-01 NOTE — Progress Notes (Signed)
Summary: refill request for xanax  Phone Note Refill Request Call back at Home Phone [phone] Message from:  Patient  Refills Requested: Medication #1:  XANAX 0.5 MG TABS 1 by mouth two times a day Phoned request from pt, [facility]  Initial call taken by: Lowella Petties CMA,  February 04, 2010 9:34 AM  Follow-up for Phone Call        Rx called to pharmacy Follow-up by: Linde Gillis CMA Duncan Dull),  February 04, 2010 9:43 AM    Prescriptions: XANAX 0.5 MG TABS (ALPRAZOLAM) 1 by mouth two times a day  #60 x 0   Entered and Authorized by:   [name] MD   Signed by:   [name] MD on 02/04/2010   Method used:   Telephoned to ...       [facility] [address] [phone]* (retail)       [address]       [place], [place]  [address]       Ph: [phone] or [phone]       Fax: [phone]   RxID:   [id number]

## 2010-06-01 NOTE — Progress Notes (Signed)
Summary: Order for diabetic shoes  Phone Note From Other Clinic Call back at [phone]   Caller: [facility] Call For: Dr. Dayton Martes Summary of Call: Received faxed form for diabetic shoe/protective inserts.  Patient called to make sure we received form.  Wanted Dr. Dayton Martes to know that she is flat footed and has heel spurs.  Form in your IN box Initial call taken by: Linde Gillis CMA Duncan Dull),  June 18, 2009 4:35 PM  Follow-up for Phone Call        Form in my box for pick up. Follow-up by: [name] MD,  June 19, 2009 8:18 AM

## 2010-06-01 NOTE — Assessment & Plan Note (Signed)
Summary: 3 MTH FU/CLE   Vital Signs:  Patient profile:   55 year old female Height:      66 inches Weight:      230 pounds BMI:     37.26 Temp:     98.8 degrees F oral Pulse rate:   72 / minute Pulse rhythm:   regular BP sitting:   140 / 80  (left arm) Cuff size:   large  Vitals Entered By: Linde Gillis CMA Duncan Dull) (March 02, 2010 9:39 AM) CC: 3 month follow up   History of Present Illness: 55 yo here for follow up depression/ anxiety.  Depression/anxiety- now only on Trazadone and Xanax as needed. Overall, feels so much better. No longer in therapy but still using the tools she learned to deal with her stressors. Not letting her daughter in law get to her as much.    DM- now diet controlled but she is concerned that at times her CBGs are spiking.  Usually running between 90-120 but has had an occassional reading in 200s.   Denies any increased thirst or urination, no episodes of hypoglycemia.  Still checking her CBGs daily. a1c in January was 6.5.  BP- has been controlled on current medications, has known white coat HTN.  No CP, SOB or blurred vision.  Current Medications (verified): 1)  Trazodone Hcl 100 Mg Tabs (Trazodone Hcl) .Marland Kitchen.. 1 By Mouth Once Daily 2)  Enalapril Maleate 20 Mg Tabs (Enalapril Maleate) .Marland Kitchen.. 1 By Mouth Once Daily 3)  Verapamil Hcl 80 Mg Tabs (Verapamil Hcl) .... Take One By Mouth Once Daily 4)  Furosemide 20 Mg Tabs (Furosemide) .Marland Kitchen.. 1 By Mouth Two Times A Day 5)  Klor-Con 20 Meq Pack (Potassium Chloride) .Marland Kitchen.. 1 By Mouth Once Daily 6)  Xanax 0.5 Mg Tabs (Alprazolam) .Marland Kitchen.. 1 By Mouth Two Times A Day 7)  Aspirin 81 Mg Tbec (Aspirin) .Marland Kitchen.. 1 By Mouth Once Daily 8)  Fexofenadine Hcl 180 Mg Tabs (Fexofenadine Hcl) .... Take One By Mouth Once Daily 9)  Flonase 50 Mcg/act Susp (Fluticasone Propionate) .... One Spray Per Day 10)  Omeprazole 20 Mg Cpdr (Omeprazole) .... Take One By Mouth Two Times A Day 11)  Vitamin D (Ergocalciferol) 50000 Unit Caps  (Ergocalciferol) .... Take One By Mouth Every Week 12)  Cyanocobalamin 1000 Mcg/ml Soln (Cyanocobalamin) .... Inject One Ml Im Once A Month 13)  Meclizine Hcl 25 Mg Tabs (Meclizine Hcl) .... Take One By Mouth Three Times A Day As Needed 14)  Amitiza 24 Mcg Caps (Lubiprostone) .... Take One By Mouth Two Times A Day 15)  Folic Acid 400 Mcg Tabs (Folic Acid) .... Take 3 Tabs By Mouth Once Daily 16)  Proctosol Hc 2.5 % Crea (Hydrocortisone) .... As Needed 17)  Fluocinonide 0.05 % Soln (Fluocinonide) .... Use As Needed 18)  Prempro 0.625-2.5 Mg Tabs (Conj Estrog-Medroxyprogest Ace) .... Take One By Mouth At Bedtime 19)  Fish Oil   Oil (Fish Oil) .... Take 1 Capsule By Mouth Once A Day 20)  Calcium Carbonate-Vitamin D 600-400 Mg-Unit  Tabs (Calcium Carbonate-Vitamin D) .... Take 1 Tablet By Mouth Once A Day 21)  Ibuprofen 800 Mg Tabs (Ibuprofen) .... Take 1 Tablet By Mouth Three Times A Day As Needed 22)  Cyclobenzaprine Hcl 10 Mg Tabs (Cyclobenzaprine Hcl) .... Take 1 Tablet By Mouth Three Times A Day As Needed Muscle Spasms 23)  Promethazine Hcl 25 Mg  Tabs (Promethazine Hcl) .Marland Kitchen.. 1 Tab Every 6 Hours As Needed For Nausea.  24)  Simvastatin 10 Mg Tabs (Simvastatin) .Marland Kitchen.. 1 By Mouth At Bedtime 25)  Polyethylene Glycol 3350  Powd (Polyethylene Glycol 3350) .... Use One Scoop Twice A Day 26)  Meloxicam 15 Mg Tabs (Meloxicam) .... Take One Tablet By Mouth Daily 27)  Ranitidine Hcl 300 Mg Caps (Ranitidine Hcl) .... Take One Tablet By Mouth At Bedtime 28)  Bepreve 1.5 % Soln (Bepotastine Besilate) .... Use Daily 29)  Astepro 0.15 % Soln (Azelastine Hcl) .... Use Daily 30)  Allergy Injections .... Twice A Week  Allergies: 1)  ! Codeine 2)  ! * Anesthetic 3)  ! * Chicken 4)  ! * Cola 5)  ! * Corn 6)  ! * Peanuts  Past History:  Past Medical History: Last updated: 01/21/2009 Anxiety Disorder Diabetes GERD Hyperlipidemia Hypertension anal fissure Arthritis Chronic Headaches colon  polyps Depression Gallstones Irritable Bowel Syndrome Obesity  Past Surgical History: Last updated: 01/21/2009 status post cardiac catherization Appendectomy Cholecystectomy Gastric Bypass lower back surgery  Family History: Last updated: 01/21/2009 Family History of Diabetes: mother, brother, maternal grandmother, uncles, aunts No FH of Colon Cancer:  Social History: Last updated: 05/07/2009 Patient has never smoked.  Alcohol Use - no Illicit Drug Use - no Disabled, moved here from [place] to be closer to her kids, both live in [place].  Risk Factors: Smoking Status: never (01/21/2009)  Review of Systems      See HPI General:  Denies malaise. Eyes:  Denies blurring. ENT:  Denies difficulty swallowing. CV:  Denies chest pain or discomfort. Resp:  Denies shortness of breath. GI:  Denies abdominal pain and change in bowel habits. MS:  Denies joint pain, joint redness, and joint swelling. Neuro:  Denies headaches. Psych:  Denies anxiety and depression. Endo:  Denies excessive thirst and excessive urination.  Physical Exam  General:  Well-developed,well-nourished,in no acute distress; alert,appropriate and cooperative throughout  Head:  normocephalic and atraumatic.   Eyes:  vision grossly intact, pupils equal, pupils round, and pupils reactive to light.   Ears:  R ear normal and L ear normal.   Nose:  no external deformity.   Mouth:  good dentition.   Lungs:  Normal respiratory effort, chest expands symmetrically. Lungs are clear to auscultation, no crackles or wheezes. Heart:  Normal rate and regular rhythm. S1 and S2 normal without gallop, murmur, click, rub or other extra sounds. Abdomen:  obese, soft, NT Extremities:  no edema Psych:  normal affect,  pleasant, good eye contact.   Impression & Recommendations:  Problem # 1:  ADJ DISORDER WITH MIXED ANXIETY & DEPRESSED MOOD (ICD-309.28) Assessment Improved Continue Trazadone and as needed Xanax.  Problem #  2:  ESSENTIAL HYPERTENSION, BENIGN (ICD-401.1) Assessment: Deteriorated Likely due to white coat HTN.  Advised taking BP at home, keeping a log.  Recheck BMET today. Continue current meds. Her updated medication list for this problem includes:    Enalapril Maleate 20 Mg Tabs (Enalapril maleate) .Marland Kitchen... 1 by mouth once daily    Verapamil Hcl 80 Mg Tabs (Verapamil hcl) .Marland Kitchen... Take one by mouth once daily    Furosemide 20 Mg Tabs (Furosemide) .Marland Kitchen... 1 by mouth two times a day  Orders: Venipuncture (47829) TLB-BMP (Basic Metabolic Panel-BMET) (80048-METABOL) Prescription Created Electronically [phone])  Problem # 3:  DM (ICD-250.00) Assessment: Deteriorated recheck a1c today. Her updated medication list for this problem includes:    Enalapril Maleate 20 Mg Tabs (Enalapril maleate) .Marland Kitchen... 1 by mouth once daily    Aspirin 81 Mg Tbec (Aspirin) .Marland KitchenMarland KitchenMarland KitchenMarland Kitchen  1 by mouth once daily  Orders: TLB-A1C / Hgb A1C (Glycohemoglobin) (83036-A1C)  Complete Medication List: 1)  Trazodone Hcl 100 Mg Tabs (Trazodone hcl) .Marland Kitchen.. 1 by mouth once daily 2)  Enalapril Maleate 20 Mg Tabs (Enalapril maleate) .Marland Kitchen.. 1 by mouth once daily 3)  Verapamil Hcl 80 Mg Tabs (Verapamil hcl) .... Take one by mouth once daily 4)  Furosemide 20 Mg Tabs (Furosemide) .Marland Kitchen.. 1 by mouth two times a day 5)  Klor-con 20 Meq Pack (Potassium chloride) .Marland Kitchen.. 1 by mouth once daily 6)  Xanax 0.5 Mg Tabs (Alprazolam) .Marland Kitchen.. 1 by mouth two times a day 7)  Aspirin 81 Mg Tbec (Aspirin) .Marland Kitchen.. 1 by mouth once daily 8)  Fexofenadine Hcl 180 Mg Tabs (Fexofenadine hcl) .... Take one by mouth once daily 9)  Flonase 50 Mcg/act Susp (Fluticasone propionate) .... One spray per day 10)  Omeprazole 20 Mg Cpdr (Omeprazole) .... Take one by mouth two times a day 11)  Vitamin D (ergocalciferol) 50000 Unit Caps (Ergocalciferol) .... Take one by mouth every week 12)  Cyanocobalamin 1000 Mcg/ml Soln (Cyanocobalamin) .... Inject one ml im once a month 13)  Meclizine Hcl 25 Mg  Tabs (Meclizine hcl) .... Take one by mouth three times a day as needed 14)  Amitiza 24 Mcg Caps (Lubiprostone) .... Take one by mouth two times a day 15)  Folic Acid 400 Mcg Tabs (Folic acid) .... Take 3 tabs by mouth once daily 16)  Proctosol Hc 2.5 % Crea (Hydrocortisone) .... As needed 17)  Fluocinonide 0.05 % Soln (Fluocinonide) .... Use as needed 18)  Prempro 0.625-2.5 Mg Tabs (Conj estrog-medroxyprogest ace) .... Take one by mouth at bedtime 19)  Fish Oil Oil (Fish oil) .... Take 1 capsule by mouth once a day 20)  Calcium Carbonate-vitamin D 600-400 Mg-unit Tabs (Calcium carbonate-vitamin d) .... Take 1 tablet by mouth once a day 21)  Ibuprofen 800 Mg Tabs (Ibuprofen) .... Take 1 tablet by mouth three times a day as needed 22)  Cyclobenzaprine Hcl 10 Mg Tabs (Cyclobenzaprine hcl) .... Take 1 tablet by mouth three times a day as needed muscle spasms 23)  Promethazine Hcl 25 Mg Tabs (Promethazine hcl) .Marland Kitchen.. 1 tab every 6 hours as needed for nausea. 24)  Simvastatin 10 Mg Tabs (Simvastatin) .Marland Kitchen.. 1 by mouth at bedtime 25)  Polyethylene Glycol 3350 Powd (Polyethylene glycol 3350) .... Use one scoop twice a day 26)  Meloxicam 15 Mg Tabs (Meloxicam) .... Take one tablet by mouth daily 27)  Ranitidine Hcl 300 Mg Caps (Ranitidine hcl) .... Take one tablet by mouth at bedtime 28)  Bepreve 1.5 % Soln (Bepotastine besilate) .... Use daily 29)  Astepro 0.15 % Soln (Azelastine hcl) .... Use daily 30)  Allergy Injections  .... Twice a week Prescriptions: PROCTOSOL HC 2.5 % CREA (HYDROCORTISONE) as needed  #3 x 3   Entered and Authorized by:   [name] MD   Signed by:   [name] MD on 03/02/2010   Method used:   Faxed to ...       [facility] (mail-order)             , [place]         Ph: [phone]       Fax: [phone]   RxID:   [id number] MELOXICAM 15 MG TABS (MELOXICAM) take one tablet by mouth daily  #30 x 3   Entered and Authorized by:   [name] MD   Signed by:   Jovita Gamma  Dayton Martes MD on  03/02/2010   Method used:   [facility]        [facility] [address] [phone]* (retail)       [address]       [place], [place]  [address]       Ph: [phone] or [phone]       Fax: [phone]   RxID:   [id number] MECLIZINE HCL 25 MG TABS (MECLIZINE HCL) take one by mouth three times a day as needed  #90 x 3   Entered and Authorized by:   [name] MD   Signed by:   [name] MD on 03/02/2010   Method used:   [facility]        [facility] [address] [phone]* (retail)       [address]       [place], [place]  [address]       Ph: [phone] or [phone]       Fax: [phone]   RxID:   [id number]    Orders Added: 1)  Venipuncture [42706] 2)  TLB-BMP (Basic Metabolic Panel-BMET) [80048-METABOL] 3)  TLB-A1C / Hgb A1C (Glycohemoglobin) [83036-A1C] 4)  Est. Patient Level IV [23762] 5)  Prescription Created Electronically [phone]    Current Allergies (reviewed today): ! CODEINE ! * ANESTHETIC ! * CHICKEN ! * COLA ! * CORN ! * PEANUTS

## 2010-06-01 NOTE — Progress Notes (Signed)
Summary: refill request for xanax  Phone Note Refill Request Message from:  Fax from Pharmacy  Refills Requested: Medication #1:  XANAX 0.5 MG TABS 1 by mouth two times a day   Last Refilled: 08/10/2009 Faxed request from [facility], phone [phone].  Initial call taken by: Lowella Petties CMA,  October 08, 2009 10:23 AM  Follow-up for Phone Call        Rx called to pharmacy Follow-up by: Linde Gillis CMA Duncan Dull),  October 08, 2009 10:36 AM    Prescriptions: XANAX 0.5 MG TABS (ALPRAZOLAM) 1 by mouth two times a day  #60 x 0   Entered and Authorized by:   [name] MD   Signed by:   [name] MD on 10/08/2009   Method used:   Telephoned to ...       [facility] [address] [phone]* (retail)       [address]       [place], [place]  [address]       Ph: [phone] or [phone]       Fax: [phone]   RxID:   [id number]

## 2010-06-01 NOTE — Progress Notes (Signed)
Summary: Proctosol  Phone Note Refill Request Message from:  [employer] on June 08, 2009 4:28 PM  Refills Requested: Medication #1:  PROCTOSOL HC 2.5 % CREA as needed [facility] [address] [phone]*   Last Fill Date:  No date sent   Pharmacy Phone:  [phone]   Method Requested: Electronic Initial call taken by: Delilah Shan CMA (AAMA),  June 08, 2009 4:30 PM    Prescriptions: PROCTOSOL HC 2.5 % CREA (HYDROCORTISONE) as needed  #28.35 gm x 0   Entered and Authorized by:   [name] MD   Signed by:   [name] MD on 06/09/2009   Method used:   Electronically to        [facility] [address] [phone]* (retail)       [address]       [place], [place]  [address]       Ph: [phone] or [phone]       Fax: [phone]   RxID:   [id number]

## 2010-06-01 NOTE — Progress Notes (Signed)
Summary: wants different insulin  Phone Note Call from Patient Call back at Home Phone [phone]   Caller: Patient Call For: [name] MD Summary of Call: Lantus soln was sent in for pt but she says she no longer takes this.  She says the last time she used insulin  she used  novolog 70/30 flex pen and would [facility]. Initial call taken by: Lowella Petties CMA, AAMA,  March 03, 2010 12:24 PM  Follow-up for Phone Call        Advised pt that insulin has been sent to pharmacy. Follow-up by: Lowella Petties CMA, AAMA,  March 03, 2010 12:39 PM    New/Updated Medications: NOVOLOG MIX 70/30 FLEXPEN 70-30 % SUSP (INSULIN ASPART PROT & ASPART) use as directed- sliding scale. Prescriptions: NOVOLOG MIX 70/30 FLEXPEN 70-30 % SUSP (INSULIN ASPART PROT & ASPART) use as directed- sliding scale.  #1 x 3   Entered and Authorized by:   [name] MD   Signed by:   [name] MD on 03/03/2010   Method used:   [facility]ronically to        [facility] [address] [phone]* (retail)       [address]       [place], [place]  [address]       Ph: [phone] or [phone]       Fax: [phone]   RxID:   [id number]

## 2010-06-01 NOTE — Letter (Signed)
Summary: [facility]  [facility]   Imported By: Lanelle Bal 06/23/2009 09:48:38  _____________________________________________________________________  External Attachment:    Type:   Image     Comment:   External Document

## 2010-06-01 NOTE — Progress Notes (Signed)
Summary: Rx Trazodone, Miralax, & Fluticasone  Phone Note Refill Request Message from:  [facility] on November 30, 2009 11:14 AM  Refills Requested: Medication #1:  FLONASE 50 MCG/ACT SUSP one spray per day   Supply Requested: 3 months  Medication #2:  MIRALAX  POWD take one scoop by mouth two times a day   Supply Requested: 3 months  Medication #3:  TRAZODONE HCL 100 MG TABS 1 by mouth once daily   Supply Requested: 3 months Received faxed refill request please advise.   Method Requested: Fax to Mail Away Pharmacy Initial call taken by: Linde Gillis CMA Duncan Dull),  November 30, 2009 11:15 AM    Prescriptions: FLUOCINONIDE 0.05 % SOLN (FLUOCINONIDE) use as needed  #1 x 0   Entered and Authorized by:   [name] MD   Signed by:   [name] MD on 11/30/2009   Method used:   Electronically to        [facility] [address] [phone]* (retail)       [address]       [place], [place]  [address]       Ph: [phone] or [phone]       Fax: [phone]   RxID:   [id number] MIRALAX  POWD (POLYETHYLENE GLYCOL 3350) take one scoop by mouth two times a day  #527 Gram x 1   Entered and Authorized by:   [name] MD   Signed by:   [name] MD on 11/30/2009   Method used:   Electronically to        [facility] [address] [phone]* (retail)       [address]       [place], [place]  [address]       Ph: [phone] or [phone]       Fax: [phone]   RxID:   [id number] TRAZODONE HCL 100 MG TABS (TRAZODONE HCL) 1 by mouth once daily  #30 x 3   Entered and Authorized by:   [name] MD   Signed by:   [name] MD on 11/30/2009   Method used:   Electronically to        [facility] [address] [phone]* (retail)       [address]       [place], [place]  [address]       Ph: [phone] or [phone]       Fax: [phone]   RxID:   [id number]

## 2010-06-01 NOTE — Letter (Signed)
Summary: [facility]  [facility]   Imported By: Sherian Rein 05/22/2009 15:04:52  _____________________________________________________________________  External Attachment:    Type:   Image     Comment:   External Document

## 2010-06-01 NOTE — Progress Notes (Signed)
Summary: diarrhea  Phone Note Call from Patient Call back at Home Phone [phone]   Caller: Patient Call For: [name] MD Summary of Call: Pt has had watery diarrhea all day and all night for 4 days.  Very little nausea one day, no fever.   She is drinking fluids, advised her to avoid dairy products.  She tried immodium but that didnt work.  [facility]. Initial call taken by: Lowella Petties CMA,  Sep 17, 2009 8:59 AM  Follow-up for Phone Call        If no blood in stool or abdominal pain, would continue pushing fluids.  If problem persists, needs to be seen. [name] MD  Sep 17, 2009 9:16 AM  Patient said that she is having some stomach pain, scheduled her to see Dr. Dayton Martes today at 11:45.  Follow-up by: Linde Gillis CMA Duncan Dull),  Sep 17, 2009 9:26 AM

## 2010-06-01 NOTE — Miscellaneous (Signed)
Summary: med list update, miralax  Clinical Lists Changes  Medications: Added new medication of POLYETHYLENE GLYCOL 3350  POWD (POLYETHYLENE GLYCOL 3350) use one scoop twice a day Removed medication of MIRALAX  POWD (POLYETHYLENE GLYCOL 3350) take one scoop by mouth two times a day     Prior Medications: TRAZODONE HCL 100 MG TABS (TRAZODONE HCL) 1 by mouth once daily ENALAPRIL MALEATE 20 MG TABS (ENALAPRIL MALEATE) 1 by mouth once daily VERAPAMIL HCL 80 MG TABS (VERAPAMIL HCL) take one by mouth once daily FUROSEMIDE 20 MG TABS (FUROSEMIDE) 1 by mouth two times a day KLOR-CON 20 MEQ PACK (POTASSIUM CHLORIDE) 1 by mouth once daily XANAX 0.5 MG TABS (ALPRAZOLAM) 1 by mouth two times a day ASPIRIN 81 MG TBEC (ASPIRIN) 1 by mouth once daily FEXOFENADINE HCL 180 MG TABS (FEXOFENADINE HCL) take one by mouth once daily FLONASE 50 MCG/ACT SUSP (FLUTICASONE PROPIONATE) one spray per day OMEPRAZOLE 20 MG CPDR (OMEPRAZOLE) take one by mouth two times a day VITAMIN D (ERGOCALCIFEROL) 50000 UNIT CAPS (ERGOCALCIFEROL) take one by mouth every week CYANOCOBALAMIN 1000 MCG/ML SOLN (CYANOCOBALAMIN) inject one ML IM once a month MECLIZINE HCL 25 MG TABS (MECLIZINE HCL) take one by mouth three times a day as needed AMITIZA 24 MCG CAPS (LUBIPROSTONE) take one by mouth two times a day FOLIC ACID 400 MCG TABS (FOLIC ACID) take 3 tabs by mouth once daily PROCTOSOL HC 2.5 % CREA (HYDROCORTISONE) as needed FLUOCINONIDE 0.05 % SOLN (FLUOCINONIDE) use as needed PREMPRO 0.625-2.5 MG TABS (CONJ ESTROG-MEDROXYPROGEST ACE) take one by mouth at bedtime FISH OIL   OIL (FISH OIL) Take 1 capsule by mouth once a day CALCIUM CARBONATE-VITAMIN D 600-400 MG-UNIT  TABS (CALCIUM CARBONATE-VITAMIN D) Take 1 tablet by mouth once a day IBUPROFEN 800 MG TABS (IBUPROFEN) Take 1 tablet by mouth three times a day as needed CYCLOBENZAPRINE HCL 10 MG TABS (CYCLOBENZAPRINE HCL) Take 1 tablet by mouth three times a day as needed muscle  spasms PROMETHAZINE HCL 25 MG  TABS (PROMETHAZINE HCL) 1 tab every 6 hours as needed for nausea. SIMVASTATIN 10 MG TABS (SIMVASTATIN) 1 by mouth at bedtime POLYETHYLENE GLYCOL 3350  POWD (POLYETHYLENE GLYCOL 3350) use one scoop twice a day Current Allergies: ! CODEINE ! * ANESTHETIC ! * CHICKEN ! * COLA ! * CORN ! * PEANUTS

## 2010-06-01 NOTE — Consult Note (Signed)
Summary: [facility]  [facility]   Imported By: Lanelle Bal 06/05/2009 10:48:45  _____________________________________________________________________  External Attachment:    Type:   Image     Comment:   External Document

## 2010-06-01 NOTE — Letter (Signed)
Summary: [facility]  [facility]   Imported By: Lanelle Bal 07/13/2009 11:09:09  _____________________________________________________________________  External Attachment:    Type:   Image     Comment:   External Document

## 2010-06-01 NOTE — Letter (Signed)
Summary: [facility]  [facility]   Imported By: Maryln Gottron 02/17/2010 15:53:33  _____________________________________________________________________  External Attachment:    Type:   Image     Comment:   External Document

## 2010-06-01 NOTE — Letter (Signed)
Summary: [facility]  [facility]   Imported By: Sherian Rein 05/22/2009 15:05:59  _____________________________________________________________________  External Attachment:    Type:   Image     Comment:   External Document

## 2010-06-01 NOTE — Assessment & Plan Note (Signed)
Summary: DISCUSS MEDICATION/CLE   Vital Signs:  Patient profile:   55 year old female Height:      66 inches Weight:      226.50 pounds BMI:     36.69 Temp:     98.8 degrees F oral Pulse rate:   88 / minute Pulse rhythm:   regular BP sitting:   112 / 80  (left arm) Cuff size:   large  Vitals Entered By: [name]) (June 03, 2009 10:42 AM) CC: Discuss medications.     2.  Itchy feet.   History of Present Illness: 55 yo here for follow up DM, depression/ anxiety.  DM- had been on her own sliding scale of Novolog.   Used to be on oral medication but stopped taking it after she lost 100 pounds s/p Gastric Bypass in 2006. Hga1c earlier this month was 6.5.  Had stopped her insulin and started her on Glyburide.  Took one dose of Glyburide and stopped it because it made her CBG drop below 70 and she was symptomatic.  Currently not taking anything for her DM.  Checks her CBGs multiple times a day, ranges from 98-120.  HTN- On Enalapril 20 mg daily, Verapamil 80 mg daily and Lasic 20 mg two times a day (with Klor con 20 meq daily).  BP well controlled today.  No CP, SOB, or blurred vision.  Depression/anxiety- constantly worried about her weight.  Gets very depressed that she did not loose more than 100 pounds.  Has tearful episodes, lack of energy, diffculty concentrating.  No SI or HI.  Feels she is too concerned about food. Referred her to psychologist at last appointment, Dr. Elyn Aquas.  Yina is very happy with Dr. Noe Gens, she sees her once a week and feels much better after she leaves her office.  Dr. Noe Gens agrees that Thressa would benefit from medication.  Has previously been on Prozac, Paxil, Zoloft and Cymbalta but felt they all made her feel funny or cause weight gain.  Feet itching- concerned that her toes have been itching.  Stopped putting lotion on her feet because she was told diabetics should not put lotion on their feet.  No pain, no rashes, no  ulcerations.  Current Medications (verified): 1)  Trazodone Hcl 100 Mg Tabs (Trazodone Hcl) .Marland Kitchen.. 1 By Mouth Once Daily 2)  Enalapril Maleate 20 Mg Tabs (Enalapril Maleate) .Marland Kitchen.. 1 By Mouth Once Daily 3)  Verapamil Hcl 80 Mg Tabs (Verapamil Hcl) .... Take One By Mouth Once Daily 4)  Furosemide 20 Mg Tabs (Furosemide) .Marland Kitchen.. 1 By Mouth Two Times A Day 5)  Klor-Con 20 Meq Pack (Potassium Chloride) .Marland Kitchen.. 1 By Mouth Once Daily 6)  Daily Vitamins  Tabs (Multiple Vitamin) .Marland Kitchen.. 1 By Mouth Once Daily 7)  Xanax 0.5 Mg Tabs (Alprazolam) .Marland Kitchen.. 1 By Mouth Two Times A Day 8)  Aspirin 81 Mg Tbec (Aspirin) .Marland Kitchen.. 1 By Mouth Once Daily 9)  Fexofenadine Hcl 180 Mg Tabs (Fexofenadine Hcl) .... Take One By Mouth Once Daily 10)  Flonase 50 Mcg/act Susp (Fluticasone Propionate) .... One Spray Per Day 11)  Omeprazole 20 Mg Cpdr (Omeprazole) .... Take One By Mouth Two Times A Day 12)  Mobic 15 Mg Tabs (Meloxicam) .... Take One By Mouth Once Daily 13)  Vitamin D (Ergocalciferol) 50000 Unit Caps (Ergocalciferol) .... Take One By Mouth Every Week 14)  Cyanocobalamin 1000 Mcg/ml Soln (Cyanocobalamin) .... Inject One Ml Im Once A Month 15)  Meclizine Hcl 25  Mg Tabs (Meclizine Hcl) .... Take One By Mouth Three Times A Day As Needed 16)  Amitiza 24 Mcg Caps (Lubiprostone) .... Take One By Mouth Two Times A Day 17)  Miralax  Powd (Polyethylene Glycol 3350) .... Take One Scoop By Mouth Two Times A Day 18)  Folic Acid 400 Mcg Tabs (Folic Acid) .... Take 3 Tabs By Mouth Once Daily 19)  Proctosol Hc 2.5 % Crea (Hydrocortisone) .... As Needed 20)  Fluocinonide 0.05 % Soln (Fluocinonide) .... Use As Needed 21)  Simvastatin 40 Mg Tabs (Simvastatin) .... Take One By Mouth At Bedtime 22)  Prempro 0.625-2.5 Mg Tabs (Conj Estrog-Medroxyprogest Ace) .... Take One By Mouth At Bedtime 23)  Wellbutrin Xl 150 Mg Xr24h-Tab (Bupropion Hcl) .Marland Kitchen.. 1 Tab By Mouth Daily.  Allergies: 1)  ! Codeine 2)  ! * Anesthetic 3)  ! * Chicken 4)  ! *  Cola 5)  ! * Corn 6)  ! * Peanuts  Review of Systems      See HPI Eyes:  Denies blurring. CV:  Denies chest pain or discomfort, swelling of feet, swelling of hands, and weight gain. Resp:  Denies cough. GI:  Denies abdominal pain. Derm:  Denies rash. Neuro:  Denies sensation of room spinning, tingling, tremors, and visual disturbances. Psych:  Complains of depression and easily tearful; denies anxiety, easily angered, irritability, mental problems, panic attacks, sense of great danger, suicidal thoughts/plans, thoughts of violence, unusual visions or sounds, and thoughts /plans of harming others. Endo:  Denies excessive thirst and excessive urination.  Physical Exam  General:  Well-developed,well-nourished,in no acute distress; alert,appropriate and cooperative throughout examination VS reviewed- normotensive Mouth:  Oral mucosa and oropharynx without lesions or exudates.  Teeth in good repair. Lungs:  Normal respiratory effort, chest expands symmetrically. Lungs are clear to auscultation, no crackles or wheezes. Heart:  Normal rate and regular rhythm. S1 and S2 normal without gallop, murmur, click, rub or other extra sounds. Abdomen:  obese, soft, NT Msk:  Toes normal in appearance other than dry skin, no obvious rash or discoloration, normal pedal pulses bilaterally Extremities:  no edema Psych:  tearful when talks about weight, pleasant, good eye contact.   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  Continue seeing Dr. Vonita Moss.  Went over all the antidepressants she has been on in past and concerns.  I feel Wellbutrin would be best for her given that she is so anxious about weight gain and having difficulties with concentration.  Will start at 150 mg daily, follow up in one month.   May need to increase to 300 mg daily.  Problem # 2:  ESSENTIAL HYPERTENSION, BENIGN (ICD-401.1) Assessment: Unchanged Stable.  Continue current meds. Her updated medication list for this problem includes:    Enalapril Maleate 20 Mg Tabs (Enalapril maleate) .Marland Kitchen... 1 by mouth once daily    Verapamil Hcl 80 Mg Tabs (Verapamil hcl) .Marland Kitchen... Take one by mouth once daily    Furosemide 20 Mg Tabs (Furosemide) .Marland Kitchen... 1 by mouth two times a day  Problem # 3:  DM (ICD-250.00) Assessment: Improved Stable off meds, I am ok with no medication at this time and rechecking a1c in 3 months. The following medications were removed from the medication list:    Novolog 100 Unit/ml Soln (Insulin aspart) ..... Sliding scale  Glucophage Xr 500 Mg Xr24h-tab (Metformin hcl) .Marland Kitchen... 1 tab by mouth daily.    Glyburide 5 Mg Tabs (Glyburide) .Marland Kitchen... 1 tablet by mouth daily Her updated medication list for this problem includes:    Enalapril Maleate 20 Mg Tabs (Enalapril maleate) .Marland Kitchen... 1 by mouth once daily    Aspirin 81 Mg Tbec (Aspirin) .Marland Kitchen... 1 by mouth once daily  Problem # 4:  DRY SKIN (ICD-701.1) Assessment: New Likely causing her symptoms of feet itching.  Advised trying Lamisil OTC to see if that help and then using mild moisturizer on feet, just not in between toes.    Complete Medication List: 1)  Trazodone Hcl 100 Mg Tabs (Trazodone hcl) .Marland Kitchen.. 1 by mouth once daily 2)  Enalapril Maleate 20 Mg Tabs (Enalapril maleate) .Marland Kitchen.. 1 by mouth once daily 3)  Verapamil Hcl 80 Mg Tabs (Verapamil hcl) .... Take one by mouth once daily 4)  Furosemide 20 Mg Tabs (Furosemide) .Marland Kitchen.. 1 by mouth two times a day 5)  Klor-con 20 Meq Pack (Potassium chloride) .Marland Kitchen.. 1 by mouth once daily 6)  Daily Vitamins Tabs (Multiple vitamin) .Marland Kitchen.. 1 by mouth once daily 7)  Xanax 0.5 Mg Tabs (Alprazolam) .Marland Kitchen.. 1 by mouth two times a day 8)  Aspirin 81 Mg Tbec (Aspirin) .Marland Kitchen.. 1 by mouth once daily 9)  Fexofenadine Hcl 180 Mg Tabs (Fexofenadine hcl) ....  Take one by mouth once daily 10)  Flonase 50 Mcg/act Susp (Fluticasone propionate) .... One spray per day 11)  Omeprazole 20 Mg Cpdr (Omeprazole) .... Take one by mouth two times a day 12)  Mobic 15 Mg Tabs (Meloxicam) .... Take one by mouth once daily 13)  Vitamin D (ergocalciferol) 50000 Unit Caps (Ergocalciferol) .... Take one by mouth every week 14)  Cyanocobalamin 1000 Mcg/ml Soln (Cyanocobalamin) .... Inject one ml im once a month 15)  Meclizine Hcl 25 Mg Tabs (Meclizine hcl) .... Take one by mouth three times a day as needed 16)  Amitiza 24 Mcg Caps (Lubiprostone) .... Take one by mouth two times a day 17)  Miralax Powd (Polyethylene glycol 3350) .... Take one scoop by mouth two times a day 18)  Folic Acid 400 Mcg Tabs (Folic acid) .... Take 3 tabs by mouth once daily 19)  Proctosol Hc 2.5 % Crea (Hydrocortisone) .... As needed 20)  Fluocinonide 0.05 % Soln (Fluocinonide) .... Use as needed 21)  Simvastatin 40 Mg Tabs (Simvastatin) .... Take one by mouth at bedtime 22)  Prempro 0.625-2.5 Mg Tabs (Conj estrog-medroxyprogest ace) .... Take one by mouth at bedtime 23)  Wellbutrin Xl 150 Mg Xr24h-tab (Bupropion hcl) .Marland Kitchen.. 1 tab by mouth daily.  Patient Instructions: 1)  Good to see you, Ms. Kassab. 2)  Please come back to see me in one month to follow up on your Wellbutrin dosing. 3)  Try Lamisil over the counter to see if it helps with your feet itching. 4)  Have a great week. Prescriptions: WELLBUTRIN XL 150 MG XR24H-TAB (BUPROPION HCL) 1 tab by mouth daily.  #30 x 0   Entered and Authorized by:   [name] MD   Signed by:   [name] MD on 06/03/2009   Method used:   [facility]        [facility] [address] [phone]* (retail)       [address]       [place], [place]  [address]       Ph:  [phone] or [phone]       Fax: [phone]   RxID:   [id number]   Current Allergies (reviewed today): ! CODEINE ! * ANESTHETIC ! * CHICKEN ! *  COLA ! * CORN ! * PEANUTS

## 2010-06-01 NOTE — Progress Notes (Signed)
Summary: refill request for xanax  Phone Note Refill Request Message from:  Fax from Pharmacy  Refills Requested: Medication #1:  XANAX 0.5 MG TABS 1 by mouth two times a day   Last Refilled: 02/04/2010 [phone]  Initial call taken by: Lowella Petties CMA, AAMA,  March 15, 2010 12:05 PM  Follow-up for Phone Call        [facility]                Lowella Petties CMA, AAMA  March 15, 2010 3:04 PM     Prescriptions: XANAX 0.5 MG TABS (ALPRAZOLAM) 1 by mouth two times a day  #60 x 0   Entered and Authorized by:   [name] MD   Signed by:   [name] MD on 03/15/2010   Method used:   Telephoned to ...       [facility] [address] [phone]* (retail)       [address]       [place], [place]  [address]       Ph: [phone] or [phone]       Fax: [phone]   RxID:   [id number]

## 2010-06-01 NOTE — Progress Notes (Signed)
Summary: refill request for trazodone, proctosol  Phone Note Refill Request Message from:  Patient  Refills Requested: Medication #1:  TRAZODONE HCL 100 MG TABS 1 by mouth once daily  Medication #2:  PROCTOSOL HC 2.5 % CREA as needed Phoned request from pt, please send to [facility]- 90 day supply.  Initial call taken by: Lowella Petties CMA,  January 07, 2010 10:56 AM    Prescriptions: PROCTOSOL HC 2.5 % CREA (HYDROCORTISONE) as needed  #28.35 Gram x 3   Entered and Authorized by:   [name] MD   Signed by:   [name] MD on 01/07/2010   Method used:   Electronically to        [facility] [address] [phone]* (retail)       [address]       [place], [place]  [address]       Ph: [phone] or [phone]       Fax: [phone]   RxID:   [id number] TRAZODONE HCL 100 MG TABS (TRAZODONE HCL) 1 by mouth once daily  #30 x 3   Entered and Authorized by:   [name] MD   Signed by:   [name] MD on 01/07/2010   Method used:   Electronically to        [facility] [address] [phone]* (retail)       [address]       [place], [place]  [address]       Ph: [phone] or [phone]       Fax: [phone]   RxID:   [id number]

## 2010-06-01 NOTE — Progress Notes (Signed)
Summary: Miralax  Phone Note Refill Request Message from:  [employer]line on June 08, 2009 10:31 AM  Refills Requested: Medication #1:  MIRALAX  POWD take one scoop by mouth two times a day [facility] [address] [phone]*   Last Fill Date:  No date sent   Pharmacy Phone:  [phone]   Method Requested: Electronic Initial call taken by: Delilah Shan CMA (AAMA),  June 08, 2009 10:32 AM    Prescriptions: MIRALAX  POWD (POLYETHYLENE GLYCOL 3350) take one scoop by mouth two times a day  #527 gm x 0   Entered and Authorized by:   [name] MD   Signed by:   [name] MD on 06/08/2009   Method used:   [facility]ronically to        [facility] [address] [phone]* (retail)       [address]       [place], [place]  [address]       Ph: [phone] or [phone]       Fax: [phone]   RxID:   [id number]

## 2010-06-01 NOTE — Progress Notes (Signed)
Summary: ok to take both wellbutrin and xanax?  Phone Note Call from Patient Call back at Home Phone [phone]   Caller: Patient Call For: [name] MD Summary of Call: Pt states she was recently put on wellbutrin.  She also takes xanax and according to the patient information sheet for the wellbutrin she is not supposed to take both.  Pt is asking if ok for her to be taking both. Initial call taken by: Lowella Petties CMA,  June 09, 2009 12:50 PM  Follow-up for Phone Call        You can be on both of them, but do not take them at same time.  I would take the Wellbutrin in the morning and Xanax as needed in the evening.  They are both CNS depressants which is why they want you to use them with caution. Follow-up by: [name] MD,  June 09, 2009 12:55 PM  Additional Follow-up for Phone Call Additional follow up Details #1::        Patient Advised.  Additional Follow-up by: Delilah Shan CMA (AAMA),  June 09, 2009 2:11 PM

## 2010-06-03 NOTE — Progress Notes (Signed)
Summary: clarification needed on combipatch  Phone Note Refill Request Message from:  Fax from Pharmacy  Refills Requested: Medication #1:  COMBIPATCH 0.05-0.14 MG/DAY PTTW use one patch per day Faxed form from [facility], they are asking for clarification on combipatch, form is on your desk.  Initial call taken by: Lowella Petties CMA, AAMA,  May 14, 2010 2:52 PM  Follow-up for Phone Call        on my desk. [name] MD  May 14, 2010 3:01 PM  Form faxed. Follow-up by: Lowella Petties CMA, AAMA,  May 14, 2010 3:18 PM

## 2010-06-03 NOTE — Progress Notes (Signed)
Summary: needs new scripts  Phone Note Refill Request Message from:  Patient  Refills Requested: Medication #1:  PROCTOSOL HC 2.5 % CREA as needed  Medication #2:  AMITIZA 24 MCG CAPS take one by mouth two times a day  Medication #3:  TRAZODONE HCL 100 MG TABS 1 by mouth once daily  Medication #4:  clobetasol 0.05% soln [facility] is [facility] and needs all new scripts sent in.  Clobetasol is not on med list.  Also, pt is [facility] and is asking if she can can cancel  her lab appt here on 2/06.  She has appt to see you today at 12:30.  Initial call taken by: Lowella Petties CMA, AAMA,  May 12, 2010 11:04 AM  Follow-up for Phone Call        yes she can.  Ok to send in 3 months supply of her scripts except for clobetasol solution.  I am not sure what this is for and who prescribed it. [name] MD  May 12, 2010 11:20 AM   Scripts sent but pt will need order for A1C sent to the cancer center, to be drawn with her other labs on 1/17. Follow-up by: Lowella Petties CMA, AAMA,  May 13, 2010 9:34 AM  Additional Follow-up for Phone Call Additional follow up Details #1::        order was already written, i gave her a written rx for labs yesterday including a1c. [name] MD  May 13, 2010 9:55 AM

## 2010-06-03 NOTE — Miscellaneous (Signed)
Summary: med list update  Clinical Lists Changes  Medications: Changed medication from RANITIDINE HCL 300 MG CAPS (RANITIDINE HCL) take one tablet by mouth at bedtime to RANITIDINE HCL 300 MG TABS (RANITIDINE HCL) take one tablet by mouth at bedtime Changed medication from [facility] 0.05-0.14 MG/DAY PTTW (ESTRADIOL-NORETHINDRONE ACET) use one patch per day to [facility] 0.05-0.14 MG/DAY PTTW (ESTRADIOL-NORETHINDRONE ACET) use one patch every 3 days     Prior Medications: TRAZODONE HCL 100 MG TABS (TRAZODONE HCL) 1 by mouth once daily ENALAPRIL MALEATE 20 MG TABS (ENALAPRIL MALEATE) 1 by mouth once daily VERAPAMIL HCL 80 MG TABS (VERAPAMIL HCL) take one by mouth once daily FUROSEMIDE 20 MG TABS (FUROSEMIDE) 1 by mouth two times a day KLOR-CON 20 MEQ PACK (POTASSIUM CHLORIDE) 1 by mouth once daily XANAX 0.5 MG TABS (ALPRAZOLAM) 1 by mouth two times a day ASPIRIN 81 MG TBEC (ASPIRIN) 1 by mouth once daily FEXOFENADINE HCL 180 MG TABS (FEXOFENADINE HCL) take one by mouth once daily FLONASE 50 MCG/ACT SUSP (FLUTICASONE PROPIONATE) one spray per day OMEPRAZOLE 20 MG CPDR (OMEPRAZOLE) take one by mouth two times a day VITAMIN D (ERGOCALCIFEROL) 50000 UNIT CAPS (ERGOCALCIFEROL) take one by mouth every week CYANOCOBALAMIN 1000 MCG/ML SOLN (CYANOCOBALAMIN) inject one ML IM once a month MECLIZINE HCL 25 MG TABS (MECLIZINE HCL) take one by mouth three times a day as needed AMITIZA 24 MCG CAPS (LUBIPROSTONE) take one by mouth two times a day FOLIC ACID 400 MCG TABS (FOLIC ACID) take 3 tabs by mouth once daily PROCTOSOL HC 2.5 % CREA (HYDROCORTISONE) as needed FLUOCINONIDE 0.05 % SOLN (FLUOCINONIDE) use as needed COMBIPATCH 0.05-0.14 MG/DAY PTTW (ESTRADIOL-NORETHINDRONE ACET) use one patch every 3 days FISH OIL   OIL (FISH OIL) Take 1 capsule by mouth once a day CALCIUM CARBONATE-VITAMIN D 600-400 MG-UNIT  TABS (CALCIUM CARBONATE-VITAMIN D) Take 1 tablet by mouth once a day IBUPROFEN 800 MG TABS  (IBUPROFEN) Take 1 tablet by mouth three times a day as needed CYCLOBENZAPRINE HCL 10 MG TABS (CYCLOBENZAPRINE HCL) Take 1 tablet by mouth three times a day as needed muscle spasms POLYETHYLENE GLYCOL 3350  POWD (POLYETHYLENE GLYCOL 3350) use one scoop twice a day MELOXICAM 15 MG TABS (MELOXICAM) take one tablet by mouth daily RANITIDINE HCL 300 MG TABS (RANITIDINE HCL) take one tablet by mouth at bedtime ASTEPRO 0.15 % SOLN (AZELASTINE HCL) use daily ALLERGY INJECTIONS () twice a week NOVOLOG MIX 70/30 FLEXPEN 70-30 % SUSP (INSULIN ASPART PROT & ASPART) inject 10 units two times a day NOVOFINE 32G X 6 MM MISC (INSULIN PEN NEEDLE) inject insulin two times a day TRAMADOL HCL 50 MG  TABS (TRAMADOL HCL) 1 tab by mouth two times a day as needed for pain. Current Allergies: ! CODEINE ! * ANESTHETIC ! * CHICKEN ! * COLA ! * CORN ! * PEANUTS

## 2010-06-03 NOTE — Progress Notes (Signed)
Summary: prior auth needed for combipatch  Phone Note Other Incoming   Caller: [employer] Summary of Call: Prior Berkley Harvey is needed for combipatch, form is on your desk.               Lowella Petties CMA, AAMA  May 18, 2010 9:59 AM  Caller: [facility]  Follow-up for Phone Call        on my desk. [name] MD  May 18, 2010 12:11 PM  Prior Berkley Harvey was denied for combipatch, but pt told me yesterday that she only uses one three times a week.  I sent in a refill with those instructions, so hopefully her insurance will cover. Follow-up by: Lowella Petties CMA, AAMA,  May 20, 2010 8:03 AM  Additional Follow-up for Phone Call Additional follow up Details #1::        thank you. [name] MD  May 20, 2010 8:14 AM

## 2010-06-03 NOTE — Progress Notes (Signed)
Summary: refill request for tramadol  Phone Note Refill Request Call back at Home Phone [phone] Message from:  Patient  Refills Requested: Medication #1:  TRAMADOL HCL 50 MG  TABS 1 tab by mouth two times a day as needed for pain.. Pt is requesting refill.  She takes one three times a day, which she says helps,  so she will need the script changed.  U[facility] [address] in [place].  Initial call taken by: Lowella Petties CMA, AAMA,  May 26, 2010 10:54 AM    Prescriptions: TRAMADOL HCL 50 MG  TABS (TRAMADOL HCL) 1 tab by mouth two times a day as needed for pain.  #60 x 0   Entered and Authorized by:   [name] MD   Signed by:   [name] MD on 05/26/2010   Method used:   [facility]        [facility] [address] [phone]* (retail)       [address]       [place], [place]  [address]       Ph: [phone] or [phone]       Fax: [phone]   RxID:   [id number]

## 2010-06-03 NOTE — Miscellaneous (Signed)
Summary: med list update  Clinical Lists Changes  Medications: Added new medication of NOVOLOG MIX 70/30 FLEXPEN 70-30 % SUSP (INSULIN ASPART PROT & ASPART) inject 10 units two times a day Added new medication of NOVOFINE 32G X 6 MM MISC (INSULIN PEN NEEDLE) inject insulin two times a day Removed medication of NOVOLOG MIX 70/30 FLEXPEN 70-30 % SUSP (INSULIN ASPART PROT & ASPART) use as directed- sliding scale. Changed medication from PREMPRO 0.625-2.5 MG TABS (CONJ ESTROG-MEDROXYPROGEST ACE) take one by mouth at bedtime to [facility] 0.05-0.14 MG/DAY PTTW (ESTRADIOL-NORETHINDRONE ACET) use one patch per day     Prior Medications: TRAZODONE HCL 100 MG TABS (TRAZODONE HCL) 1 by mouth once daily ENALAPRIL MALEATE 20 MG TABS (ENALAPRIL MALEATE) 1 by mouth once daily VERAPAMIL HCL 80 MG TABS (VERAPAMIL HCL) take one by mouth once daily FUROSEMIDE 20 MG TABS (FUROSEMIDE) 1 by mouth two times a day KLOR-CON 20 MEQ PACK (POTASSIUM CHLORIDE) 1 by mouth once daily XANAX 0.5 MG TABS (ALPRAZOLAM) 1 by mouth two times a day ASPIRIN 81 MG TBEC (ASPIRIN) 1 by mouth once daily FEXOFENADINE HCL 180 MG TABS (FEXOFENADINE HCL) take one by mouth once daily FLONASE 50 MCG/ACT SUSP (FLUTICASONE PROPIONATE) one spray per day OMEPRAZOLE 20 MG CPDR (OMEPRAZOLE) take one by mouth two times a day VITAMIN D (ERGOCALCIFEROL) 50000 UNIT CAPS (ERGOCALCIFEROL) take one by mouth every week CYANOCOBALAMIN 1000 MCG/ML SOLN (CYANOCOBALAMIN) inject one ML IM once a month MECLIZINE HCL 25 MG TABS (MECLIZINE HCL) take one by mouth three times a day as needed AMITIZA 24 MCG CAPS (LUBIPROSTONE) take one by mouth two times a day FOLIC ACID 400 MCG TABS (FOLIC ACID) take 3 tabs by mouth once daily PROCTOSOL HC 2.5 % CREA (HYDROCORTISONE) as needed FLUOCINONIDE 0.05 % SOLN (FLUOCINONIDE) use as needed COMBIPATCH 0.05-0.14 MG/DAY PTTW (ESTRADIOL-NORETHINDRONE ACET) use one patch per day FISH OIL   OIL (FISH OIL) Take 1 capsule  by mouth once a day CALCIUM CARBONATE-VITAMIN D 600-400 MG-UNIT  TABS (CALCIUM CARBONATE-VITAMIN D) Take 1 tablet by mouth once a day IBUPROFEN 800 MG TABS (IBUPROFEN) Take 1 tablet by mouth three times a day as needed CYCLOBENZAPRINE HCL 10 MG TABS (CYCLOBENZAPRINE HCL) Take 1 tablet by mouth three times a day as needed muscle spasms PROMETHAZINE HCL 25 MG  TABS (PROMETHAZINE HCL) 1 tab every 6 hours as needed for nausea. SIMVASTATIN 10 MG TABS (SIMVASTATIN) 1 by mouth at bedtime POLYETHYLENE GLYCOL 3350  POWD (POLYETHYLENE GLYCOL 3350) use one scoop twice a day MELOXICAM 15 MG TABS (MELOXICAM) take one tablet by mouth daily RANITIDINE HCL 300 MG CAPS (RANITIDINE HCL) take one tablet by mouth at bedtime BEPREVE 1.5 % SOLN (BEPOTASTINE BESILATE) use daily ASTEPRO 0.15 % SOLN (AZELASTINE HCL) use daily ALLERGY INJECTIONS () twice a week NOVOLOG MIX 70/30 FLEXPEN 70-30 % SUSP (INSULIN ASPART PROT & ASPART) inject 10 units two times a day NOVOFINE 32G X 6 MM MISC (INSULIN PEN NEEDLE) inject insulin two times a day Current Allergies: ! CODEINE ! * ANESTHETIC ! * CHICKEN ! * COLA ! * CORN ! * PEANUTS

## 2010-06-03 NOTE — Assessment & Plan Note (Signed)
Summary: SHOULDER PAIN  X 1 MTH- CYD   Vital Signs:  Patient profile:   55 year old female Height:      66 inches Weight:      237 pounds BMI:     38.39 Temp:     98.4 degrees F oral Pulse rate:   94 / minute Pulse rhythm:   regular BP sitting:   130 / 90  (left arm) Cuff size:   large  Vitals Entered By: Linde Gillis CMA Duncan Dull) (May 12, 2010 12:18 PM) CC: right shoulder pain   History of Present Illness: 55 yo here for bilateral shoulder pain since she fell and sprained her ankle 2 months ago. Right> left.  Fell on right side, sprained her left ankle. Symptoms are getting progressively worse. Very painful to lift her arms over her head.  NO radiculopathy.  Feels like her right arm is often "frozen." Meloxicam and ipubrofen are not helping. Has old rx for oxycodone which helps but makes her sleepy.  Current Medications (verified): 1)  Trazodone Hcl 100 Mg Tabs (Trazodone Hcl) .Marland Kitchen.. 1 By Mouth Once Daily 2)  Enalapril Maleate 20 Mg Tabs (Enalapril Maleate) .Marland Kitchen.. 1 By Mouth Once Daily 3)  Verapamil Hcl 80 Mg Tabs (Verapamil Hcl) .... Take One By Mouth Once Daily 4)  Furosemide 20 Mg Tabs (Furosemide) .Marland Kitchen.. 1 By Mouth Two Times A Day 5)  Klor-Con 20 Meq Pack (Potassium Chloride) .Marland Kitchen.. 1 By Mouth Once Daily 6)  Xanax 0.5 Mg Tabs (Alprazolam) .Marland Kitchen.. 1 By Mouth Two Times A Day 7)  Aspirin 81 Mg Tbec (Aspirin) .Marland Kitchen.. 1 By Mouth Once Daily 8)  Fexofenadine Hcl 180 Mg Tabs (Fexofenadine Hcl) .... Take One By Mouth Once Daily 9)  Flonase 50 Mcg/act Susp (Fluticasone Propionate) .... One Spray Per Day 10)  Omeprazole 20 Mg Cpdr (Omeprazole) .... Take One By Mouth Two Times A Day 11)  Vitamin D (Ergocalciferol) 50000 Unit Caps (Ergocalciferol) .... Take One By Mouth Every Week 12)  Cyanocobalamin 1000 Mcg/ml Soln (Cyanocobalamin) .... Inject One Ml Im Once A Month 13)  Meclizine Hcl 25 Mg Tabs (Meclizine Hcl) .... Take One By Mouth Three Times A Day As Needed 14)  Amitiza 24 Mcg Caps  (Lubiprostone) .... Take One By Mouth Two Times A Day 15)  Folic Acid 400 Mcg Tabs (Folic Acid) .... Take 3 Tabs By Mouth Once Daily 16)  Proctosol Hc 2.5 % Crea (Hydrocortisone) .... As Needed 17)  Fluocinonide 0.05 % Soln (Fluocinonide) .... Use As Needed 18)  Combipatch 0.05-0.14 Mg/day Pttw (Estradiol-Norethindrone Acet) .... Use One Patch Per Day 19)  Fish Oil   Oil (Fish Oil) .... Take 1 Capsule By Mouth Once A Day 20)  Calcium Carbonate-Vitamin D 600-400 Mg-Unit  Tabs (Calcium Carbonate-Vitamin D) .... Take 1 Tablet By Mouth Once A Day 21)  Ibuprofen 800 Mg Tabs (Ibuprofen) .... Take 1 Tablet By Mouth Three Times A Day As Needed 22)  Cyclobenzaprine Hcl 10 Mg Tabs (Cyclobenzaprine Hcl) .... Take 1 Tablet By Mouth Three Times A Day As Needed Muscle Spasms 23)  Simvastatin 10 Mg Tabs (Simvastatin) .Marland Kitchen.. 1 By Mouth At Bedtime 24)  Polyethylene Glycol 3350  Powd (Polyethylene Glycol 3350) .... Use One Scoop Twice A Day 25)  Meloxicam 15 Mg Tabs (Meloxicam) .... Take One Tablet By Mouth Daily 26)  Ranitidine Hcl 300 Mg Caps (Ranitidine Hcl) .... Take One Tablet By Mouth At Bedtime 27)  Astepro 0.15 % Soln (Azelastine Hcl) .... Use Daily 28)  Allergy Injections .... Twice A Week 29)  Novolog Mix 70/30 Flexpen 70-30 % Susp (Insulin Aspart Prot & Aspart) .... Inject 10 Units Two Times A Day 30)  Novofine 32g X 6 Mm Misc (Insulin Pen Needle) .... Inject Insulin Two Times A Day 31)  Tramadol Hcl 50 Mg  Tabs (Tramadol Hcl) .Marland Kitchen.. 1 Tab By Mouth Two Times A Day As Needed For Pain.  Allergies: 1)  ! Codeine 2)  ! * Anesthetic 3)  ! * Chicken 4)  ! * Cola 5)  ! * Corn 6)  ! * Peanuts  Past History:  Past Medical History: Last updated: 01/21/2009 Anxiety Disorder Diabetes GERD Hyperlipidemia Hypertension anal fissure Arthritis Chronic Headaches colon polyps Depression Gallstones Irritable Bowel Syndrome Obesity  Past Surgical History: Last updated: 01/21/2009 status post cardiac  catherization Appendectomy Cholecystectomy Gastric Bypass lower back surgery  Family History: Last updated: 01/21/2009 Family History of Diabetes: mother, brother, maternal grandmother, uncles, aunts No FH of Colon Cancer:  Social History: Last updated: 05/07/2009 Patient has never smoked.  Alcohol Use - no Illicit Drug Use - no Disabled, moved here from [place] to be closer to her kids, both live in [place].  Risk Factors: Smoking Status: never (01/21/2009)  Review of Systems      See HPI MS:  Complains of joint pain and loss of strength; denies joint redness and joint swelling.  Physical Exam  General:  Well-developed,well-nourished,in no acute distress; alert,appropriate and cooperative throughout    Shoulder/Elbow Exam  Shoulder Exam:    Right:    Inspection:  Normal    Palpation:  Abnormal       Location:  right bicipital groove    Stability:  stable    Tenderness:  yes    Swelling:  no    Erythema:  no    Pos empty can, pos arch sign. Very ttp over ac joing and bicipital groove.      Left:    Inspection:  Normal    Palpation:  Normal    Stability:  unstable    non tender to palp but pos arch   Impression & Recommendations:  Problem # 1:  SHOULDER PAIN, RIGHT (ICD-719.41) Assessment New ?rotator cuff tear along with bursitis. Pt has orthopedist, dr. Turner Daniels.  I will refer back to him for injections and possible MRI. Tramadol as needed pain. Her updated medication list for this problem includes:    Aspirin 81 Mg Tbec (Aspirin) .Marland Kitchen... 1 by mouth once daily    Ibuprofen 800 Mg Tabs (Ibuprofen) .Marland Kitchen... Take 1 tablet by mouth three times a day as needed    Cyclobenzaprine Hcl 10 Mg Tabs (Cyclobenzaprine hcl) .Marland Kitchen... Take 1 tablet by mouth three times a day as needed muscle spasms    Meloxicam 15 Mg Tabs (Meloxicam) .Marland Kitchen... Take one tablet by mouth daily    Tramadol Hcl 50 Mg Tabs (Tramadol hcl) .Marland Kitchen... 1 tab by mouth two times a day as needed for pain.  Complete  Medication List: 1)  Trazodone Hcl 100 Mg Tabs (Trazodone hcl) .Marland Kitchen.. 1 by mouth once daily 2)  Enalapril Maleate 20 Mg Tabs (Enalapril maleate) .Marland Kitchen.. 1 by mouth once daily 3)  Verapamil Hcl 80 Mg Tabs (Verapamil hcl) .... Take one by mouth once daily 4)  Furosemide 20 Mg Tabs (Furosemide) .Marland Kitchen.. 1 by mouth two times a day 5)  Klor-con 20 Meq Pack (Potassium chloride) .Marland Kitchen.. 1 by mouth once daily 6)  Xanax 0.5 Mg Tabs (Alprazolam) .Marland Kitchen.. 1 by mouth  two times a day 7)  Aspirin 81 Mg Tbec (Aspirin) .Marland Kitchen.. 1 by mouth once daily 8)  Fexofenadine Hcl 180 Mg Tabs (Fexofenadine hcl) .... Take one by mouth once daily 9)  Flonase 50 Mcg/act Susp (Fluticasone propionate) .... One spray per day 10)  Omeprazole 20 Mg Cpdr (Omeprazole) .... Take one by mouth two times a day 11)  Vitamin D (ergocalciferol) 50000 Unit Caps (Ergocalciferol) .... Take one by mouth every week 12)  Cyanocobalamin 1000 Mcg/ml Soln (Cyanocobalamin) .... Inject one ml im once a month 13)  Meclizine Hcl 25 Mg Tabs (Meclizine hcl) .... Take one by mouth three times a day as needed 14)  Amitiza 24 Mcg Caps (Lubiprostone) .... Take one by mouth two times a day 15)  Folic Acid 400 Mcg Tabs (Folic acid) .... Take 3 tabs by mouth once daily 16)  Proctosol Hc 2.5 % Crea (Hydrocortisone) .... As needed 17)  Fluocinonide 0.05 % Soln (Fluocinonide) .... Use as needed 18)  Combipatch 0.05-0.14 Mg/day Pttw (Estradiol-norethindrone acet) .... Use one patch per day 19)  Fish Oil Oil (Fish oil) .... Take 1 capsule by mouth once a day 20)  Calcium Carbonate-vitamin D 600-400 Mg-unit Tabs (Calcium carbonate-vitamin d) .... Take 1 tablet by mouth once a day 21)  Ibuprofen 800 Mg Tabs (Ibuprofen) .... Take 1 tablet by mouth three times a day as needed 22)  Cyclobenzaprine Hcl 10 Mg Tabs (Cyclobenzaprine hcl) .... Take 1 tablet by mouth three times a day as needed muscle spasms 23)  Simvastatin 10 Mg Tabs (Simvastatin) .Marland Kitchen.. 1 by mouth at bedtime 24)   Polyethylene Glycol 3350 Powd (Polyethylene glycol 3350) .... Use one scoop twice a day 25)  Meloxicam 15 Mg Tabs (Meloxicam) .... Take one tablet by mouth daily 26)  Ranitidine Hcl 300 Mg Caps (Ranitidine hcl) .... Take one tablet by mouth at bedtime 27)  Astepro 0.15 % Soln (Azelastine hcl) .... Use daily 28)  Allergy Injections  .... Twice a week 29)  Novolog Mix 70/30 Flexpen 70-30 % Susp (Insulin aspart prot & aspart) .... Inject 10 units two times a day 30)  Novofine 32g X 6 Mm Misc (Insulin pen needle) .... Inject insulin two times a day 31)  Tramadol Hcl 50 Mg Tabs (Tramadol hcl) .Marland Kitchen.. 1 tab by mouth two times a day as needed for pain. Prescriptions: TRAMADOL HCL 50 MG  TABS (TRAMADOL HCL) 1 tab by mouth two times a day as needed for pain.  #60 x 0   Entered and Authorized by:   [name] MD   Signed by:   [name] MD on 05/12/2010   Method used:   Print then Give to Patient   RxID:   [id number]    Orders Added: 1)  Est. Patient Level IV [14782]    Current Allergies (reviewed today): ! CODEINE ! * ANESTHETIC ! * CHICKEN ! * COLA ! * CORN ! * PEANUTS

## 2010-06-03 NOTE — Progress Notes (Signed)
Summary: ibuprofen   Phone Note Refill Request Message from:  [facility] on April 12, 2010 3:35 PM  Refills Requested: Medication #1:  IBUPROFEN 800 MG TABS Take 1 tablet by mouth three times a day as needed   Supply Requested: [facility] [address]   Initial call taken by: Melody Comas,  April 12, 2010 3:36 PM    Prescriptions: IBUPROFEN 800 MG TABS (IBUPROFEN) Take 1 tablet by mouth three times a day as needed  #90 x 3   Entered and Authorized by:   [name] MD   Signed by:   [name] MD on 04/12/2010   Method used:   [facility]        [facility] [address] [phone]* (retail)       [address]       [place], [place]  [address]       Ph: [phone] or [phone]       Fax: [phone]   RxID:   [id number]

## 2010-06-04 ENCOUNTER — Telehealth: Payer: Self-pay | Admitting: Family Medicine

## 2010-06-07 ENCOUNTER — Other Ambulatory Visit: Payer: Self-pay

## 2010-06-09 NOTE — Progress Notes (Signed)
Summary: Rx Tramadol  Phone Note Refill Request Call back at [phone] Message from:  [facility] on June 04, 2010 10:42 AM  Refills Requested: Medication #1:  TRAMADOL HCL 50 MG  TABS 1 tab by mouth two times a day as needed for pain.Marland Kitchen Received E-script request please advise.   Method Requested: Telephone to Pharmacy Initial call taken by: Linde Gillis CMA [facility] Dull),  June 04, 2010 10:43 AM    Prescriptions: TRAMADOL HCL 50 MG  TABS (TRAMADOL HCL) 1 tab by mouth two times a day as needed for pain.  #60 x 0   Entered and Authorized by:   [name] MD   Signed by:   [name] MD on 06/04/2010   Method used:   [facility]        [facility] [address] [phone]* (retail)       [address]       [place], [place]  [address]       Ph: [phone] or [phone]       Fax: [phone]   RxID:   [id number]   Appended Document: Rx Tramadol Rx [facility].

## 2010-06-09 NOTE — Progress Notes (Signed)
Summary: Rx Alprazolam  Phone Note Refill Request Call back at [phone] Message from:  [facility] on June 04, 2010 10:39 AM  Refills Requested: Medication #1:  XANAX 0.5 MG TABS 1 by mouth two times a day Received E-script request please advise.   Method Requested: Telephone to Pharmacy Initial call taken by: Linde Gillis CMA Duncan Dull),  June 04, 2010 10:40 AM  Follow-up for Phone Call        Rx called to [facility] per patients request along with Rx for Tramadol. Follow-up by: Linde Gillis CMA [facility] Dull),  June 04, 2010 11:07 AM    Prescriptions: XANAX 0.5 MG TABS (ALPRAZOLAM) 1 by mouth two times a day  #60 x 0   Entered and Authorized by:   [name] MD   Signed by:   [name] MD on 06/04/2010   Method used:   Telephoned to ...       [facility] [address] [phone]* (retail)       [address]       [place], [place]  [address]       Ph: [phone] or [phone]       Fax: [phone]   RxID:   [id number]

## 2010-06-17 ENCOUNTER — Encounter (HOSPITAL_BASED_OUTPATIENT_CLINIC_OR_DEPARTMENT_OTHER): Payer: Medicare HMO | Admitting: Oncology

## 2010-06-17 DIAGNOSIS — E538 Deficiency of other specified B group vitamins: Secondary | ICD-10-CM

## 2010-06-22 ENCOUNTER — Ambulatory Visit (INDEPENDENT_AMBULATORY_CARE_PROVIDER_SITE_OTHER): Payer: Medicare HMO | Admitting: Gynecology

## 2010-06-22 DIAGNOSIS — N951 Menopausal and female climacteric states: Secondary | ICD-10-CM

## 2010-07-01 ENCOUNTER — Telehealth: Payer: Self-pay | Admitting: Family Medicine

## 2010-07-08 NOTE — Progress Notes (Signed)
Summary: Rx Tramadol  Phone Note Refill Request Call back at [phone] Message from:  [facility] on July 01, 2010 2:08 PM  Refills Requested: Medication #1:  TRAMADOL HCL 50 MG  TABS 1 tab by mouth two times a day as needed for pain..   Last Refilled: 06/04/2010 Received E-script refill request please advise.   Method Requested: Electronic Initial call taken by: Linde Gillis CMA Duncan Dull),  July 01, 2010 2:08 PM    Prescriptions: TRAMADOL HCL 50 MG  TABS (TRAMADOL HCL) 1 tab by mouth two times a day as needed for pain.  #60 x 0   Entered and Authorized by:   [name] MD   Signed by:   [name] MD on 07/01/2010   Method used:   [facility]        [facility] [address] [phone]* (retail)       [address]       [place], [place]  [address]       Ph: [phone] or [phone]       Fax: [phone]   RxID:   [id number]   Appended Document: Rx Tramadol Tramadol sent to the wrong pharmacy.  Cancelled script at [facility], called to [facility].

## 2010-07-15 ENCOUNTER — Encounter (HOSPITAL_BASED_OUTPATIENT_CLINIC_OR_DEPARTMENT_OTHER): Payer: Medicare HMO | Admitting: Oncology

## 2010-07-15 DIAGNOSIS — E538 Deficiency of other specified B group vitamins: Secondary | ICD-10-CM

## 2010-07-16 LAB — GLUCOSE, CAPILLARY

## 2010-07-16 LAB — BASIC METABOLIC PANEL
BUN: 13 mg/dL (ref 6–23)
CO2: 25 mEq/L (ref 19–32)
GFR calc non Af Amer: >60 mL/min (ref >60)
Glucose, Bld: 291 mg/dL — ABNORMAL HIGH (ref 70–99)
Potassium: 3.9 mEq/L (ref 3.5–5.1)
Sodium: 137 mEq/L (ref 135–145)

## 2010-07-19 ENCOUNTER — Ambulatory Visit: Payer: Medicare HMO | Admitting: Family Medicine

## 2010-07-19 ENCOUNTER — Telehealth: Payer: Self-pay | Admitting: Family Medicine

## 2010-07-20 ENCOUNTER — Other Ambulatory Visit: Payer: Self-pay | Admitting: Family Medicine

## 2010-07-20 MED ORDER — TRAZODONE HCL 100 MG PO TABS: 100.0000 mg | ORAL_TABLET | Freq: Every day | ORAL | Status: DC

## 2010-07-21 ENCOUNTER — Other Ambulatory Visit: Payer: Self-pay | Admitting: Family Medicine

## 2010-07-21 MED ORDER — HYDROCORTISONE 2.5 % RE CREA: TOPICAL_CREAM | Freq: Two times a day (BID) | RECTAL | Status: AC

## 2010-07-22 ENCOUNTER — Encounter: Payer: Self-pay | Admitting: Family Medicine

## 2010-07-22 LAB — HM SIGMOIDOSCOPY

## 2010-07-22 LAB — HM MAMMOGRAPHY

## 2010-07-22 LAB — HM COLONOSCOPY

## 2010-07-27 ENCOUNTER — Encounter: Payer: Self-pay | Admitting: Family Medicine

## 2010-07-27 ENCOUNTER — Ambulatory Visit (INDEPENDENT_AMBULATORY_CARE_PROVIDER_SITE_OTHER): Payer: Medicare HMO | Admitting: Family Medicine

## 2010-07-27 VITALS — BP 130/84 | HR 68 | Temp 98.8°F | Ht 66.0 in | Wt 243.8 lb

## 2010-07-27 DIAGNOSIS — E1143 Type 2 diabetes mellitus with diabetic autonomic (poly)neuropathy: Secondary | ICD-10-CM

## 2010-07-27 DIAGNOSIS — E1149 Type 2 diabetes mellitus with other diabetic neurological complication: Secondary | ICD-10-CM

## 2010-07-27 DIAGNOSIS — G909 Disorder of the autonomic nervous system, unspecified: Secondary | ICD-10-CM

## 2010-07-27 DIAGNOSIS — R635 Abnormal weight gain: Secondary | ICD-10-CM | POA: Insufficient documentation

## 2010-07-27 MED ORDER — FENTANYL 25 MCG/HR TD PT72: 1.0000 | MEDICATED_PATCH | TRANSDERMAL | Status: AC

## 2010-07-27 MED ORDER — METFORMIN HCL 500 MG PO TABS: 500.0000 mg | ORAL_TABLET | Freq: Two times a day (BID) | ORAL | Status: DC

## 2010-07-27 NOTE — Progress Notes (Signed)
  Subjective:    Patient ID: Kayla Mayo, female    DOB: 1956-02-24, 55 y.o.   MRN: [id number]  HPI 55 yo with DM and anxiety issues concerning her weight here to discuss weight gain.  She is s/p gastric bypass and has the ultimate fear of gaining all of her weight back.  Still attending water aerobics three times per week. Trying to cut back on calories but feels hungry all the time.  Wt Readings from Last 3 Encounters:  05/12/10 237 lb (107.502 kg)  03/02/10 230 lb (104.327 kg)  11/30/09 231 lb 4 oz (104.894 kg)   Diabetes:  Using medications without difficulties: on insulin 10 units twice daily Tried oral medications in past but she was afraid they would make them gain weight so has only been on insulin. Hypoglycemic episodes: yes- at times, drops down into the 80s which makes her eat.  She thinks this is part of why she has gained weight. Hyperglycemic episodes: no Feet problems: yes Blood Sugars averaging: 120 eye exam within last year: yes   Lab Results  Component Value Date   HGBA1C 6.7* 05/18/2010          Review of Systems  Constitutional: Positive for unexpected weight change. Negative for fever.  Gastrointestinal: Negative for nausea, diarrhea and abdominal distention.  Genitourinary: Negative for urgency.   Marland Kitchendm    Objective:   Physical Exam BP 130/84  Pulse 68  Temp(Src) 98.8 F (37.1 C) (Oral)  Ht 5\' 6"  (1.676 m)  Wt 243 lb 12.8 oz (110.587 kg)  BMI 39.35 kg/m2    eneral:  Well-developed,well-nourished,in no acute distress; alert,appropriate and cooperative throughout  Head:  normocephalic and atraumatic.   Eyes:  vision grossly intact, pupils equal, pupils round, and pupils reactive to light.   Ears:  R ear normal and L ear normal.   Nose:  no external deformity.   Mouth:  good dentition.   Lungs:  Normal respiratory effort, chest expands symmetrically. Lungs are clear to auscultation, no crackles or wheezes. Heart:  Normal rate and regular  rhythm. S1 and S2 normal without gallop, murmur, click, rub or other extra sounds. Abdomen:  obese, soft, NT Extremities:  no edema Diabetic foot exam: Normal inspection No skin breakdown No calluses  Normal DP pulses Normal sensation to light tough and monofilament Nails normal  Psych:  normal affect,  pleasant, good eye contact.    Assessment & Plan:

## 2010-07-27 NOTE — Assessment & Plan Note (Signed)
Likely due to diet and snacking due to hypoglycemic events.

## 2010-07-27 NOTE — Patient Instructions (Signed)
Stop taking your insulin. Start taking Metformin 500 mg twice daily. Keep your lab appointment for next month.    IMPORTANT: HOW TO USE THIS INFORMATION:  This is a summary and does NOT have all possible information about this product. This information does not assure that this product is safe, effective, or appropriate for you. This information is not individual medical advice and does not substitute for the advice of your health care professional. Always ask your health care professional for complete information about this product and your specific health needs.    METFORMIN - ORAL (met-FOR-min)    COMMON BRAND NAME(S): Glucophage    WARNING:  Metformin can rarely cause a serious (sometimes fatal) condition called lactic acidosis. Stop taking metformin and get medical help right away if you develop any of the following symptoms of lactic acidosis: unusual tiredness, dizziness, severe drowsiness, chills, blue/cold skin, muscle pain, fast/difficult breathing, slow/irregular heartbeat, stomach pain with nausea, vomiting, or diarrhea. Lactic acidosis is more likely to occur in patients who have certain medical conditions, including kidney or liver disease, recent surgery, a serious infection, conditions that may cause a low level of oxygen in the blood or poor circulation (such as congestive heart failure, recent heart attack, recent stroke), heavy alcohol use, a severe loss of body fluids (dehydration), or X-ray or scanning procedures that require an injectable iodinated contrast drug. Tell your doctor immediately if any of these conditions occur or if you notice a big change in your overall health. You may need to stop taking this medication temporarily. The elderly are also at higher risk, especially those older than 80 years who have not had kidney tests. (See also Side Effects and Precautions sections.)    USES:  Metformin is used with a proper diet and exercise program and possibly with other  medications to control high blood sugar. It is used in patients with type 2 diabetes (non-insulin-dependent diabetes). Controlling high blood sugar helps prevent kidney damage, blindness, nerve problems, loss of limbs, and sexual function problems. Proper control of diabetes may also lessen your risk of a heart attack or stroke. Metformin works by helping to restore your body's proper response to the insulin you naturally produce. It also decreases the amount of sugar that your liver makes and that your stomach/intestines absorb.    OTHER USES:  This section contains uses of this drug that are not listed in the approved professional labeling for the drug but that may be prescribed by your health care professional. Use this drug for a condition that is listed in this section only if it has been so prescribed by your health care professional. Metformin may be used with lifestyle changes such as diet and exercise to prevent diabetes in people who are at high risk for becoming diabetic. It is also used in women with a certain disease of the ovaries (polycystic ovarian syndrome). Metformin may make menstrual cycles more regular and increase fertility.    HOW TO USE:  Read the Patient Information Leaflet if available from your pharmacist before you start taking metformin and each time you get a refill. If you have any questions, consult your doctor or pharmacist. Take this medication by mouth as directed by your doctor, usually 1-3 times a day with meals. Drink plenty of fluids while taking this medication unless otherwise directed by your doctor. The dosage is based on your medical condition, kidney function, and response to treatment. Your doctor may direct you to take a low dose of this  medication at first, gradually increasing your dose to lower the chance of side effects such as upset stomach. Your doctor will adjust your dose based on your blood sugar levels to find the best dose for you. Follow your doctor's  directions carefully. Take this medication regularly in order to get the most benefit from it. Remember to use it at the same times each day. If you are already taking another anti-diabetic drug (such as chlorpropamide), follow your doctor's directions carefully for stopping/continuing the old drug and starting metformin. Check your blood sugar regularly as directed by your doctor. Keep track of the results, and share them with your doctor. Tell your doctor if your blood sugar measurements are too high or too low. Your dosage/treatment may need to be changed.    SIDE EFFECTS:  Nausea, vomiting, stomach upset, diarrhea, weakness, or a metallic taste in the mouth may occur. If any of these effects persist or worsen, tell your doctor or pharmacist promptly. If stomach symptoms return later (after taking the same dose for several days or weeks), tell your doctor immediately. Stomach symptoms that occur after the first days of your treatment may be signs of lactic acidosis. Remember that your doctor has prescribed this medication because he or she has judged that the benefit to you is greater than the risk of side effects. Many people using this medication do not have serious side effects. Metformin does not usually cause low blood sugar (hypoglycemia). Low blood sugar may occur if this drug is prescribed with other anti-diabetic medications. Talk with your doctor or pharmacist about whether the dose of your other diabetic medication(s) needs to be lowered. Symptoms of low blood sugar include sudden sweating, shaking, fast heartbeat, hunger, blurred vision, dizziness, or tingling hands/feet. It is a good habit to carry glucose tablets or gel to treat low blood sugar. If you don't have these reliable forms of glucose, rapidly raise your blood sugar by eating a quick source of sugar such as table sugar, honey, or candy, or drink fruit juice or non-diet soda. Tell your doctor about the reaction immediately. Low blood  sugar is more likely if you drink large amounts of alcohol, do unusually heavy exercise, or do not consume enough calories from food. To help prevent low blood sugar, eat meals on a regular schedule, and do not skip meals. Check with your doctor or pharmacist to find out what you should do if you miss a meal. Symptoms of high blood sugar (hyperglycemia) include thirst, increased urination, confusion, drowsiness, flushing, rapid breathing, and fruity breath odor. If these symptoms occur, tell your doctor immediately. Your doctor may need to adjust your diabetes medication(s). Stop taking this medication and tell your doctor right away if this very serious side effect occurs: lactic acidosis (see Warning section). A very serious allergic reaction to this drug is rare. However, get medical help right away if you notice any of the following symptoms of a serious allergic reaction: rash, itching/swelling (especially of the face/tongue/throat), severe dizziness, trouble breathing. This is not a complete list of possible side effects. If you notice other effects not listed above, contact your doctor or pharmacist. In the [place] - Call your doctor for medical advice about side effects. You may report side effects to FDA at 1-800-FDA-1088. In [place] - Call your doctor for medical advice about side effects. You may report side effects to [facility] [place] at [phone].    PRECAUTIONS:  See also Warning section. Before taking this medication, tell your doctor  or pharmacist if you are allergic to metformin; or if you have any other allergies. This product may contain inactive ingredients, which can cause allergic reactions or other problems. Talk to your pharmacist for more details. Before using this medication, tell your doctor or pharmacist your medical history, especially of: severe breathing problems (such as obstructive lung disease, severe asthma), metabolic acidosis (such as diabetic ketoacidosis), blood problems (such as  anemia, vitamin B12 deficiency), kidney disease, liver disease. Before having surgery or any X-ray/scanning procedure using injectable iodinated contrast material, tell your doctor that you are taking this medication. You will need to temporarily stop this medication before the time of your surgery/procedure. Consult your doctor for further instructions. Before having surgery, tell your doctor or dentist about all the products you use (including prescription drugs, nonprescription drugs, and herbal products). You may experience blurred vision, dizziness, or drowsiness due to extremely low or high blood sugar levels. Do not drive, use machinery, or do any activity that requires alertness or clear vision until you are sure you can perform such activities safely. Limit alcohol while using this medication because it can increase your risk of lactic acidosis and developing low blood sugar. High fever, "water pills" (diuretics such as hydrochlorothiazide), too much sweating, diarrhea, or vomiting may cause loss of too much body water (dehydration) and increase your risk of lactic acidosis. Stop taking this medication and tell your doctor right away if you have prolonged diarrhea or vomiting. Be sure to drink enough fluids to prevent dehydration unless your doctor directs you otherwise. It may be harder to control your blood sugar when your body is stressed (such as due to fever, infection, injury, or surgery). Consult your doctor because increased stress may require a change in your treatment plan, medications, or blood sugar testing. Older adults may be at greater risk for side effects such as low blood sugar or lactic acidosis. During pregnancy, this medication should be used only when clearly needed. Discuss the risks and benefits with your doctor. Your doctor may direct you to use insulin instead of this product during your pregnancy. Follow your doctor's instructions carefully. Metformin can cause changes in the  menstrual cycle (promote ovulation) and increase the risk of becoming pregnant. Consult your doctor or pharmacist about the use of reliable birth control while using this medication. Metformin passes into breast milk in small amounts. Consult your doctor before breast-feeding.    DRUG INTERACTIONS:  Drug interactions may change how your medications work or increase your risk for serious side effects. This document does not contain all possible drug interactions. Keep a list of all the products you use (including prescription/nonprescription drugs and herbal products) and share it with your doctor and pharmacist. Do not start, stop, or change the dosage of any medicines without your doctor's approval.    OVERDOSE:  If overdose is suspected, contact a poison control center or emergency room immediately. [place] residents can call the [place] National Poison Hotline at 415-784-4648. [place] residents can call a Technical sales engineer center. Overdose can cause lactic acidosis. Symptoms of overdose may include: severe drowsiness, severe nausea/vomiting/diarrhea, rapid breathing, slow/irregular heartbeat.    NOTES:  Do not share this medication with others. You should attend a diabetes education program to learn more about diabetes and all the important aspects of its treatment, including meals/diet, exercise, personal hygiene, medications, and getting regular eye/foot/medical exams. Keep all medical appointments. Laboratory and/or medical tests (such as liver/kidney function tests, blood glucose, hemoglobin A1c, complete blood counts)  should be performed periodically to check for side effects and monitor your response to treatment. Check your blood sugar levels regularly as directed.    MISSED DOSE:  If you miss a dose, take it as soon as you remember with food. If it is near the time of the next dose, skip the missed dose and resume your usual dosing schedule. Do not double the dose to catch up.    STORAGE:  Store  at room temperature away from light and moisture. Do not store in the bathroom. Keep all medications away from children and pets. Do not flush medications down the toilet or pour them into a drain unless instructed to do so. Properly discard this product when it is expired or no longer needed. Consult your pharmacist or local waste disposal company for more details about how to safely discard your product.    MEDICAL ALERT:  Your condition can cause complications in a medical emergency. For information about enrolling in [facility], call [phone] ([place]) or [phone] ([place]).    Information last revised October 2010 Copyright(c) 2010 [employer].

## 2010-07-27 NOTE — Assessment & Plan Note (Signed)
>  25 min spent with patient, at least half of which was spent on counseling DM control and her weight. Will d/c the insulin and start Metformin 500 mg bid. Due for a1c next month, will keep this appointment- see pt instructions for details.

## 2010-07-29 NOTE — Progress Notes (Signed)
Summary: refill request for amitiza  Phone Note Refill Request Message from:  Fax from Pharmacy  Refills Requested: Medication #1:  AMITIZA 24 MCG CAPS take one by mouth two times a day [facility]ed [facility] from [facility] is on your desk.    Initial call taken by: Lowella Petties CMA, AAMA,  July 19, 2010 4:00 PM  Follow-up for Phone Call        in my box. [name] MD  July 19, 2010 4:09 PM   Additional Follow-up for Phone Call Additional follow up Details #1::        Form faxed.           Lowella Petties CMA, AAMA  July 19, 2010 4:21 PM     Prescriptions: AMITIZA 24 MCG CAPS (LUBIPROSTONE) take one by mouth two times a day  #180 x 3   Entered by:   Lowella Petties CMA, AAMA   Authorized by:   [name] MD   Signed by:   Lowella Petties CMA, AAMA on 07/19/2010   Method used:   Telephoned to ...       [facility] (mail-order)             , [place]         Ph: [phone]       Fax: [phone]   RxID:   [id number]

## 2010-07-29 NOTE — Progress Notes (Signed)
Summary:  PROCTOSOL / TRAMADOL  Phone Note Refill Request Message from:  Fax from Pharmacy on July 19, 2010 9:39 AM  Refills Requested: Medication #1:  PROCTOSOL HC 2.5 % CREA as needed  Medication #2:  TRAMADOL HCL 50 MG  TABS 1 tab by mouth two times a day as needed for pain.Marland Kitchen [facility] [id number]   Initial call taken by: Melody Comas,  July 19, 2010 9:40 AM    Prescriptions: TRAMADOL HCL 50 MG  TABS (TRAMADOL HCL) 1 tab by mouth two times a day as needed for pain.  #60 x 0   Entered and Authorized by:   [name] MD   Signed by:   [name] MD on 07/19/2010   Method used:   Electronically to        [facility]  [address] [phone]* (retail)       [address]       [place], [place]  [address]       Ph: [phone] or [phone]       Fax: [phone]   RxID:   [id number] PROCTOSOL HC 2.5 % CREA (HYDROCORTISONE) as needed  #3 months x 0   Entered and Authorized by:   [name] MD   Signed by:   [name] MD on 07/19/2010   Method used:   [facility]        [facility] [address] [phone]* (retail)       [address]       [place], [place]  [address]       Ph: [phone] or [phone]       Fax: [phone]   RxID:   [id number]

## 2010-08-05 ENCOUNTER — Ambulatory Visit (INDEPENDENT_AMBULATORY_CARE_PROVIDER_SITE_OTHER): Payer: Medicare HMO | Admitting: Gynecology

## 2010-08-05 ENCOUNTER — Other Ambulatory Visit: Payer: Self-pay | Admitting: Gynecology

## 2010-08-05 DIAGNOSIS — N950 Postmenopausal bleeding: Secondary | ICD-10-CM

## 2010-08-05 DIAGNOSIS — Z79890 Hormone replacement therapy (postmenopausal): Secondary | ICD-10-CM

## 2010-08-06 LAB — GLUCOSE, CAPILLARY: Glucose-Capillary: 123 mg/dL — ABNORMAL HIGH (ref 70–99)

## 2010-08-08 LAB — POCT I-STAT, CHEM 8
Calcium, Ion: 0.97 mmol/L — ABNORMAL LOW (ref 1.12–1.32)
Creatinine, Ser: 0.7 mg/dL (ref 0.4–1.2)
Glucose, Bld: 133 mg/dL — ABNORMAL HIGH (ref 70–99)
HCT: 40 % (ref 36.0–46.0)
Hemoglobin: 13.6 g/dL (ref 12.0–15.0)
Potassium: 3.8 mEq/L (ref 3.5–5.1)
TCO2: 21 mmol/L (ref 0–100)

## 2010-08-08 LAB — CARDIAC PANEL(CRET KIN+CKTOT+MB+TROPI)
CK, MB: 0.5 ng/mL (ref 0.3–4.0)
Total CK: 94 U/L (ref 7–177)
Troponin I: 0.01 ng/mL (ref 0.00–0.06)

## 2010-08-08 LAB — DIFFERENTIAL
Basophils Absolute: 0 10*3/uL (ref 0.0–0.1)
Eosinophils Relative: 1 % (ref 0–5)
Lymphocytes Relative: 38 % (ref 12–46)
Lymphs Abs: 2 10*3/uL (ref 0.7–4.0)
Neutro Abs: 2.9 10*3/uL (ref 1.7–7.7)
Neutrophils Relative %: 55 % (ref 43–77)

## 2010-08-08 LAB — CBC
HCT: 33.1 % — ABNORMAL LOW (ref 36.0–46.0)
Hemoglobin: 11.3 g/dL — ABNORMAL LOW (ref 12.0–15.0)
MCHC: 34.1 g/dL (ref 30.0–36.0)
Platelets: 199 10*3/uL (ref 150–400)
RDW: 13.6 % (ref 11.5–15.5)
RDW: 13.7 % (ref 11.5–15.5)
WBC: 5.4 10*3/uL (ref 4.0–10.5)

## 2010-08-08 LAB — HOMOCYSTEINE: Homocysteine: 10.8 umol/L (ref 4.0–15.4)

## 2010-08-08 LAB — BASIC METABOLIC PANEL
BUN: 13 mg/dL (ref 6–23)
Calcium: 8.8 mg/dL (ref 8.4–10.5)
Creatinine, Ser: 0.71 mg/dL (ref 0.4–1.2)
GFR calc non Af Amer: >60 mL/min (ref >60)
Glucose, Bld: 141 mg/dL — ABNORMAL HIGH (ref 70–99)

## 2010-08-08 LAB — URINALYSIS, ROUTINE W REFLEX MICROSCOPIC
Glucose, UA: NEGATIVE mg/dL
Hgb urine dipstick: NEGATIVE
Ketones, ur: NEGATIVE mg/dL
pH: 5.5 (ref 5.0–8.0)

## 2010-08-08 LAB — PROTIME-INR: INR: 1 (ref 0.00–1.49)

## 2010-08-08 LAB — POCT CARDIAC MARKERS
CKMB, poc: <1 ng/mL — ABNORMAL LOW (ref 1.0–8.0)
Myoglobin, poc: 58.8 ng/mL (ref 12–200)

## 2010-08-08 LAB — CK TOTAL AND CKMB (NOT AT ARMC): CK, MB: 0.6 ng/mL (ref 0.3–4.0)

## 2010-08-08 LAB — HEMOGLOBIN A1C
Hgb A1c MFr Bld: 6.3 % — ABNORMAL HIGH (ref 4.6–6.1)
Mean Plasma Glucose: 134 mg/dL

## 2010-08-08 LAB — PHOSPHORUS: Phosphorus: 3.9 mg/dL (ref 2.3–4.6)

## 2010-08-08 LAB — MAGNESIUM: Magnesium: 2.3 mg/dL (ref 1.5–2.5)

## 2010-08-08 LAB — GLUCOSE, CAPILLARY: Glucose-Capillary: 121 mg/dL — ABNORMAL HIGH (ref 70–99)

## 2010-08-10 ENCOUNTER — Other Ambulatory Visit: Payer: Self-pay | Admitting: Family Medicine

## 2010-08-16 ENCOUNTER — Other Ambulatory Visit: Payer: Self-pay | Admitting: Family Medicine

## 2010-08-16 ENCOUNTER — Telehealth: Payer: Self-pay | Admitting: *Deleted

## 2010-08-16 DIAGNOSIS — I10 Essential (primary) hypertension: Secondary | ICD-10-CM

## 2010-08-16 DIAGNOSIS — E119 Type 2 diabetes mellitus without complications: Secondary | ICD-10-CM

## 2010-08-16 DIAGNOSIS — E785 Hyperlipidemia, unspecified: Secondary | ICD-10-CM

## 2010-08-16 NOTE — Telephone Encounter (Signed)
Rx's were pending but I called them to [facility].

## 2010-08-16 NOTE — Telephone Encounter (Signed)
[name], please follow up on refill requests from 4/10 to go to [facility].  Looks like the requests came from surescript[facility], the meds havent been sent to the pharmacy.  Pt is ok today, not out of anything yet.

## 2010-08-19 ENCOUNTER — Encounter (HOSPITAL_BASED_OUTPATIENT_CLINIC_OR_DEPARTMENT_OTHER): Payer: Medicare HMO | Admitting: Oncology

## 2010-08-19 DIAGNOSIS — E538 Deficiency of other specified B group vitamins: Secondary | ICD-10-CM

## 2010-08-20 ENCOUNTER — Other Ambulatory Visit: Payer: Self-pay | Admitting: Family Medicine

## 2010-08-20 ENCOUNTER — Other Ambulatory Visit: Payer: Self-pay | Admitting: *Deleted

## 2010-08-20 MED ORDER — TRAZODONE HCL 100 MG PO TABS: 100.0000 mg | ORAL_TABLET | Freq: Every day | ORAL | Status: DC

## 2010-08-24 ENCOUNTER — Other Ambulatory Visit: Payer: Self-pay | Admitting: *Deleted

## 2010-08-24 ENCOUNTER — Other Ambulatory Visit (INDEPENDENT_AMBULATORY_CARE_PROVIDER_SITE_OTHER): Payer: Medicare HMO | Admitting: Family Medicine

## 2010-08-24 DIAGNOSIS — E785 Hyperlipidemia, unspecified: Secondary | ICD-10-CM

## 2010-08-24 DIAGNOSIS — I10 Essential (primary) hypertension: Secondary | ICD-10-CM

## 2010-08-24 DIAGNOSIS — E119 Type 2 diabetes mellitus without complications: Secondary | ICD-10-CM

## 2010-08-24 LAB — LIPID PANEL
HDL: 73.1 mg/dL (ref >39.00)
Total CHOL/HDL Ratio: 3
Triglycerides: 61 mg/dL (ref 0.0–149.0)
VLDL: 12.2 mg/dL (ref 0.0–40.0)

## 2010-08-24 LAB — BASIC METABOLIC PANEL
BUN: 14 mg/dL (ref 6–23)
CO2: 27 mEq/L (ref 19–32)
Calcium: 9.1 mg/dL (ref 8.4–10.5)
Chloride: 106 mEq/L (ref 96–112)
Creatinine, Ser: 0.8 mg/dL (ref 0.4–1.2)

## 2010-08-24 MED ORDER — TRAZODONE HCL 100 MG PO TABS: 100.0000 mg | ORAL_TABLET | Freq: Every day | ORAL | Status: DC

## 2010-08-24 NOTE — Telephone Encounter (Signed)
Rx already called to [facility].

## 2010-08-24 NOTE — Telephone Encounter (Signed)
Rx for Alprazolam called to [facility].

## 2010-08-26 ENCOUNTER — Other Ambulatory Visit: Payer: Self-pay | Admitting: *Deleted

## 2010-08-26 MED ORDER — TRAZODONE HCL 100 MG PO TABS: 100.0000 mg | ORAL_TABLET | Freq: Every day | ORAL | Status: DC

## 2010-09-07 ENCOUNTER — Other Ambulatory Visit: Payer: Self-pay | Admitting: *Deleted

## 2010-09-07 MED ORDER — HYDROCORTISONE 2.5 % RE CREA: TOPICAL_CREAM | Freq: Two times a day (BID) | RECTAL | Status: AC

## 2010-09-07 MED ORDER — POTASSIUM CHLORIDE CRYS ER 20 MEQ PO TBCR: 20.0000 meq | EXTENDED_RELEASE_TABLET | Freq: Two times a day (BID) | ORAL | Status: DC

## 2010-09-07 MED ORDER — TRAZODONE HCL 100 MG PO TABS: 100.0000 mg | ORAL_TABLET | Freq: Every day | ORAL | Status: DC

## 2010-09-07 MED ORDER — LUBIPROSTONE 24 MCG PO CAPS: 24.0000 ug | ORAL_CAPSULE | Freq: Two times a day (BID) | ORAL | Status: AC

## 2010-09-07 NOTE — Telephone Encounter (Signed)
Patient requested refills for a 90 day supply be sent to [facility].  Rx for Klor-con sent to [facility] per patients request.

## 2010-09-14 NOTE — Cardiovascular Report (Signed)
Kayla Mayo, Kayla Mayo               ACCOUNT NO.:  [id number]   MEDICAL RECORD NO.:  [id number]          PATIENT TYPE:  OBV   LOCATION:  5502                         FACILITY:  [facility]   PHYSICIAN:  Corky Crafts, MDDATE OF BIRTH:  01/11/1956   DATE OF PROCEDURE:  11/28/2008  DATE OF DISCHARGE:                            CARDIAC CATHETERIZATION   REFERRING PHYSICIAN:  Armanda Magic, MD   PROCEDURES PERFORMED:  Left heart catheterization, left ventriculogram,  coronary angiogram, and abdominal aortogram.   INDICATIONS:  Unstable angina and diabetes.   OPERATOR:  Corky Crafts, MD   PROCEDURE:  The risks and benefits of cardiac catheterization were  explained to the patient and informed consent was obtained.  She was  brought to the cath lab.  She was prepped and draped in the usual  sterile fashion.  Her right groin was infiltrated with 1% lidocaine.  A  6-French sheath was placed into the right femoral artery using modified  Seldinger technique.  Left coronary artery angiography was performed  using a JL-4 pigtail catheter.  The catheter was advanced to the vessel  ostium under fluoroscopic guidance.  Digital angiography was performed  in multiple projections using hand injection of contrast.  Right  coronary artery angiography was performed using a JR-4.0 catheter.  The  catheter was advanced to the vessel ostium under fluoroscopic guidance.  Digital angiography was performed in multiple projections using hand  injection of contrast.  A pigtail catheter was advanced to the ascending  aorta and across the aortic valve under fluoroscopic guidance.  Power  injection of contrast was performed in the RAO projection to image the  left ventricle.  The catheter was pulled back under continuous  hemodynamic pressure monitoring.  Catheter was then withdrawn to the  abdominal aorta.  Power injection of contrast was performed in the AP  projection to image the infrarenal aorta and  renal arteries.  The sheath  was removed and Angio-Seal deployed for hemostasis.  Ancef was given  because the patient is diabetic.   FINDINGS:  The left main artery is widely patent.  Left circumflex is a large vessel with minimal irregularities.  There is  a large OM-1, which is widely patent.  The left anterior descending is a medium-sized vessel, which reaches the  apex, it is widely patent proximally.  The distal vessel was small.  The  first diagonal is a medium-sized vessel, which was widely patent.  The right coronary artery is a medium-sized vessel, which is widely  patent.  There is a dominant vessel.  Left ventriculogram showed normal left ventricular function, left  ventricular pressure 129/5 with an LVEDP of 14 mmHg.  Aortic pressure  128/71 with a mean aortic pressure of 96 mmHg.  Estimated ejection  fraction of 55-60%.  The abdominal aortogram showed no abdominal aortic aneurysm.  There are  bilateral single renal arteries both of which were widely patent.   IMPRESSION:  1. No significant coronary artery disease.  2. Normal left ventricular function.  3. No aortic valve gradient.  4. Normal infrarenal aorta.  No abdominal aortic aneurysm.  No renal      artery stenosis.   RECOMMENDATIONS:  Continue aggressive primary prevention and risk factor  modification.      Corky Crafts, MD  Electronically Signed     JSV/MEDQ  D:  11/28/2008  T:  11/29/2008  Job:  [id number]

## 2010-09-14 NOTE — Discharge Summary (Signed)
[name], Kayla Mayo               ACCOUNT NO.:  [id number]   MEDICAL RECORD NO.:  [id number]          PATIENT TYPE:  OBV   LOCATION:  5502                         FACILITY:  [facility]   PHYSICIAN:  Monte Fantasia, MD  DATE OF BIRTH:  06-03-1955   DATE OF ADMISSION:  11/27/2008  DATE OF DISCHARGE:  11/28/2008                               DISCHARGE SUMMARY   PRIMARY CARE PHYSICIAN:  Unassigned.   CARDIOLOGIST:  Corky Crafts, MD   DISCHARGE DIAGNOSES:  1. Chest pain, rule out acute coronary syndrome.  2. Anxiety disorder.  3. Status post cardiac catheterization.  4. Gastroesophageal reflux disease.  5. Hypertension.  6. Diabetes.  7. Hyperlipidemia.   DISCHARGE MEDICATIONS:  1. NovoLog sliding scale insulin for blood sugars 121-150 two units,      for 201-250 five units, for 251-300 eight units, for 301-350      eleven, and for 351-400 fifteen units.  2. Trazodone 100 mg p.o. daily.  3. Enalapril 20 mg p.o. daily.  4. Ultram 50 mg as needed.  5. Verapamil 180 mg p.o. daily.  6. Furosemide 20 mg twice daily.  7. Potassium chloride 20 mEq p.o. daily.  8. Multivitamins 1 tablet p.o. daily.  9. Xanax 0.5 mg p.o. b.i.d.  10.Aspirin 81 mg p.o. daily.  11.Lovastatin 40 mg p.o. daily.   COURSE DURING THE HOSPITAL STAY:  [name] is a 55 year old African  American lady patient was admitted on November 27, 2008, with complaints of  chest pain, stated that the pain was intermittent, retrosternal,  radiating to her neck and the back, associated with some shortness of  breath and palpitations.  The patient was admitted to telemetry bed and  cardiac enzymes were cycled x3.  The patient was also evaluated in view  of her chest pain by Cardiology and underwent a cardiac cath on November 28, 2008, for the same.  As per the cardiac cath, no significant coronary  artery disease was reported, normal LV function, and normal LV function  was reported.  The patient has been advised to  aggressive primary  prevention.  The patient at present is status post cardiac cath and  would be medically stable to be discharged in next 4 hours.   RADIOLOGICAL INVESTIGATIONS DONE DURING THE STAY IN THE HOSPITAL:  1. Chest x-ray done on November 27, 2008:  Impression:  No acute      cardiopulmonary disease.  Low lung volumes.  2. V/Q scan done on November 28, 2008:  Impression:  Normal ventilation-      perfusion lung study.  No evidence of pulmonary embolism.   LABORATORY DATA DONE DURING THE STAY IN THE HOSPITAL:  Total WBC 4.4,  hemoglobin 11.3, hematocrit 33.1, and platelets of 172.  Prothrombin  time 15.2, INR 1.2, PTT 29.  Sodium 136, potassium 3.8, chloride 108,  bicarb 24, glucose 133, BUN 15, creatinine 0.7, calcium of 8.5,  phosphorus 3.9, and magnesium 2.3.  Cardiac enzymes x2 sets negative.  TSH 6.783.  UA is negative.   PROCEDURES DONE DURING THE STAY IN THE HOSPITAL:  Cardiac  catheterization  done by Dr. Eldridge Dace on November 28, 2008, reported as  normal LV function.  No abdominal aortic aneurysm.  LAD midsize vessel.  Widely patent distal vessels, small.  D1 midsize, widely patent.  RCA  midsize, widely patent, dominant vessel.   RECOMMENDATIONS:  Advised aggressive primary prevention   DISPOSITION:  The patient is medically stable to be discharged and can  be discharged home after 4 hours.   Total time for discharge of 40 minutes.      Monte Fantasia, MD  Electronically Signed     MP/MEDQ  D:  11/28/2008  T:  11/29/2008  Job:  [id number]   cc:   Corky Crafts, MD

## 2010-09-14 NOTE — H&P (Signed)
Kayla Mayo, Kayla Mayo               ACCOUNT NO.:  [id number]   MEDICAL RECORD NO.:  [id number]          PATIENT TYPE:  OBV   LOCATION:  5502                         FACILITY:  [facility]   PHYSICIAN:  Herbie Saxon, MDDATE OF BIRTH:  [birth year]   DATE OF ADMISSION:  11/27/2008  DATE OF DISCHARGE:                              HISTORY & PHYSICAL   PRIMARY CARE PHYSICIAN:  Unassigned.   PRESENTING COMPLAINTS:  Chest pain, 1-day duration.   HISTORY OF PRESENTING COMPLAINT:  This 55 year old African American lady  who lives in [place], [place], was quite well until about 11  a.m. earlier today when she started having left substernal chest pain,  sharp, radiating to the back and left shoulder.  The patient had been  having intermittent chest pain in the last few weeks.  Severity of this  particular chest pain was about 5-6/10.  Associated with some vague  shortness of breath and palpitations.  The patient denies any syncopal  episode.  She does have a history of vertigo, but she denies any  worsening of dizziness.  The patient denies any diaphoresis.  She is  status post lumbar spine decompression surgery.  The patient does have  intermittent gastroesophageal reflux symptoms.  She denies any  hematochezia, hematemesis, or abdominal pain.  The patient was worked up  for chest pain in November 2009 in [place].  Stress test and  echocardiogram at that time was negative and the patient was diagnosed  with anxiety.  Started on trazodone.   PAST MEDICAL HISTORY:  Diabetes, hypertension, arthritis,  hypercholesterolemia, irritable bowel syndrome, and vertigo.   SURGERY:  Back surgery in March 2010.   FAMILY HISTORY:  Grandmother had myocardial infarction.  Mother had CVA.   SOCIAL HISTORY:  She is disabled, married.  She has 2 children.  She  does not smoke, drink, or use illicit drugs.   ALLERGIES:  CODEINE.   HOME MEDICATIONS:  1. NovoLog 10 units subcu p.r.n.  2.  Loratadine 1 tablet daily.  3. Trazodone 100 mg daily.  4. Lovastatin 40 mg daily.  5. Enalapril 20 mg daily.  6. Ultram 50 mg daily.  7. Verapamil 180 mg daily.  8. Furosemide 20 mg b.i.d.  9. Potassium chloride 20 mEq daily.  10.Multivitamin 1 tablet daily.   REVIEW OF SYMPTOMS:  A 14-point review of systems was done.  Pertinent  positives are palpitations, shortness of breath.  No pedal swelling.  No  dysuria.  No hematuria.  No polyuria.  No hallucinations.  No tingling  or numbness.   PHYSICAL EXAMINATION:  GENERAL:  She is a middle-aged lady, not in acute  distress.  VITAL SIGNS:  Temperature 98, pulse 64, respiratory rate 15, and blood  pressure 127/79.  HEENT:  Pupils are equal and reactive to light and accommodation.  Extraocular muscles are intact.  Mucous membranes are moist.  Oropharynx  and nasopharynx are clear.  NECK:  Supple.  There is no jugular venous distention.  No thyromegaly.  No carotid bruit.  CHEST:  Clinically clear.  No rales or rhonchi.  HEART:  Heart  sounds 1 and 2.  Regular rate and rhythm.  No murmur,  rubs, gallops, or clicks.  ABDOMEN:  Soft and nontender.  Bowel sounds present.  No organomegaly.  No ventral or inguinal hernias.  CNS:  No focal neurological deficits.  MUSCULOSKELETAL:  Normal range of movement of all joints.  SKIN:  No skin rash.  EXTREMITIES:  No clubbing.  No cyanosis.   LABORATORY DATA:  WBC 5.4, hematocrit 38, and platelet count 199.  Chemistry:  Sodium is 136, potassium 3.8, chloride 108, bicarbonate 21,  BUN 15, creatinine 0.7, and glucose is 133.  Chest x-ray shows low lung  volumes, no acute changes.  EKG, normal sinus rhythm at 65 per minute  with borderline left axis deviation.   ASSESSMENT:  Atypical chest pain, rule out acute coronary syndrome,  anxiety disorder, gastroesophageal reflux disease, and left axis  deviation.  The patient is to be admitted for 23-hour telemetry  observation and we will get serial  cardiac enzymes and EKG q.8 hours x3.  We will obtain thyroid function test, fasting lipid, homocysteine, and 2-  D echocardiogram.  We will check a D-dimer stat and also check hemoccult  x2.  Bedrest over the next 24 hours.  Diet should be low cholesterol,  1800 calorie ADA heart healthy.  Keep her n.p.o. from midnight for  possible stress test in the a.m.  IV fluid normal saline at 25 mL an  hour.  DVT prophylaxis with heparin 5000 units subcu q.8 hours and  continue Protonix 40 mg IV q.12-24 hours.  She is to be on NovoLog  sliding scale moderate scale coverage, morphine 2 mg IV q.6 h. P.r.n.,  nitro paste 0.5 inch q.6 h. removing between 6 a.m. to noon daily,  Lopressor 2.5 mg IV q.6 hour p.r.n. for irregular heart rate greater  than 119 per minute or blood pressure greater than 160/110, Xanax 0.5 mg  b.i.d., enteric-coated aspirin 325 mg daily.  Continue her previous home  medications.  The patient's illness, medication, tests, treatment, and  plan were explained to her and she verbalizes understanding.      Herbie Saxon, MD  Electronically Signed     Herbie Saxon, MD  Electronically Signed    MIO/MEDQ  D:  11/27/2008  T:  11/28/2008  Job:  [id number]

## 2010-09-14 NOTE — Consult Note (Signed)
Kayla Mayo, Kayla Mayo               ACCOUNT NO.:  [id number]   MEDICAL RECORD NO.:  [id number]          PATIENT TYPE:  OBV   LOCATION:  5502                         FACILITY:  [facility]   PHYSICIAN:  Armanda Magic, M.D.     DATE OF BIRTH:  04/05/1956   DATE OF CONSULTATION:  11/28/2008  DATE OF DISCHARGE:  11/28/2008                                 CONSULTATION   REFERRING PHYSICIAN:  Hospitalist.   CHIEF COMPLAINT:  Chest pain.   HISTORY OF PRESENT ILLNESS:  This is a very pleasant 55 year old [place]  American female with no prior cardiac history except for a normal stress  test and echo done in [place] in November 2009 due to atypical  chest pain.  He now presents to the emergency room with an episode of  substernal chest pain that occurred at 11:00 a.m. on November 27, 2008,  which was described as sharp with radiation to the left shoulder and  back.  Apparently when she woke up, she said she had some numbness and  tingling in her left arm and her left shoulder, hurt really bad and was  exacerbated by any type of movement and then she started having sharp  stabbing chest pain in her left breast, which she is continued to have  intermittently during this hospital stay.  She also has had intermittent  chest pain over the past few weeks associated with some palpitations.  She did have chest pain in 2009 which was felt to be due to anxiety and  she was placed on anti-anxiety medications, although she is continued  intermittently have the sharp chest pains.  She has had some vague  shortness of breath associated with palpitations as well.  She does have  a history of GERD.  We are now asked to consult.   PAST MEDICAL HISTORY:  1. Diabetes mellitus.  2. Hypertension.  3. Arthritis.  4. Dyslipidemia.  5. Irritable bowel syndrome.  6. Vertigo.   PAST SURGICAL HISTORY:  Back surgery in March 2010.   FAMILY HISTORY:  Her maternal grandmother had an MI.  Her mother had a  CVA.   ALLERGIES:  CODEINE.   SOCIAL HISTORY:  She is married and disabled.  She has 2 children.  She  denies any tobacco or alcohol use.  No IV drug use.   REVIEW OF SYSTEMS:  Other than stated in the HPI is negative.   MEDICATIONS:  1. NovoLog insulin 10 units subcu p.r.n.  2. Loratadine 1 tablet daily.  3. Trazodone 100 mg a day.  4. Lovastatin 40 mg a day.  5. Enalapril 20 mg a day.  6. Ultram 50 mg a day.  7. Verapamil 180 mg a day.  8. Furosemide 20 mg b.i.d.  9. KCl 20 mEq a day.   PHYSICAL EXAMINATION:  VITAL SIGNS:  Blood pressure 109/71, heart rate  61.  GENERAL:  This is a well-developed, well-nourished black female in no  distress.  HEENT:  Benign.  NECK:  Supple without lymphadenopathy.  Carotid upstrokes +2.  No  bruits.  LUNGS:  Clear to auscultation throughout.  HEART:  Regular rate and rhythm.  No murmurs, rubs, or gallops.  ABDOMEN:  Soft, nontender, nondistended.  Normoactive bowel sounds.  No  hepatosplenomegaly.  EXTREMITIES:  No edema.   LABORATORY DATA:  Sodium 136, potassium 3.8, chloride 107, bicarb 24,  BUN 13, creatinine 0.71.  White cell count 5.4, hemoglobin 13.1,  hematocrit 38.7, platelet count 199.  D-dimer 0.74.  CPKs 125 and 94,  MBs 0.6 and 0.5.  Relative index 0.5.  Troponin 0.01 x2.  Point-of-care  markers x2 are negative.   Chest x-ray, no acute disease.  EKG shows sinus rhythm with left axis  deviation.  No ST changes.   ASSESSMENT:  1. Atypical chest pain.  The patient is a diabetic with hypertension,      dyslipidemia, and a family history of coronary artery disease.      Cardiac enzymes are negative x2 and EKG is normal, but I am      concerned with her having recurrent chest pain.  Again given the      fact that she is diabetic, she may not have typical angina.  A      nuclear stress test done in November 2009 was reportedly normal,      but since she has continued to have episodic atypical chest pain      and is diabetic with a  false negative rate of 15% on nuclear stress      testing, I would recommend proceeding with cardiac catheterization.  2. Diabetes mellitus.  3. Hypertension.  4. Dyslipidemia.   PLAN:  Cardiac catheterization to evaluate coronary anatomy since she is  diabetic with hypertension and dyslipidemia and recurrent chest pain  since her nuclear stress test in November.  I have explained the risks  and benefits of heart cath including the risk of MI, CVA, death,  bleeding complications, IV contrast allergy, renal failure, and vascular  complications.  The patient understands and wishes to proceed.  We will  check a V/Q scan to rule out PE prior to undergoing her heart cath.      Armanda Magic, M.D.  Electronically Signed     Armanda Magic, M.D.  Electronically Signed    TT/MEDQ  D:  11/28/2008  T:  11/29/2008  Job:  [id number]

## 2010-09-16 ENCOUNTER — Encounter (HOSPITAL_BASED_OUTPATIENT_CLINIC_OR_DEPARTMENT_OTHER): Payer: Medicare HMO | Admitting: Oncology

## 2010-09-16 DIAGNOSIS — E538 Deficiency of other specified B group vitamins: Secondary | ICD-10-CM

## 2010-09-18 ENCOUNTER — Other Ambulatory Visit: Payer: Self-pay | Admitting: Family Medicine

## 2010-10-04 ENCOUNTER — Other Ambulatory Visit: Payer: Self-pay | Admitting: Family Medicine

## 2010-10-07 ENCOUNTER — Other Ambulatory Visit: Payer: Self-pay | Admitting: *Deleted

## 2010-10-07 MED ORDER — HYDROCORTISONE 2.5 % RE CREA: TOPICAL_CREAM | RECTAL | Status: DC

## 2010-10-07 NOTE — Telephone Encounter (Signed)
Rx faxed to [facility] at [phone].

## 2010-10-07 NOTE — Telephone Encounter (Signed)
Faxed request from [facility] is on your desk.

## 2010-10-19 ENCOUNTER — Ambulatory Visit (INDEPENDENT_AMBULATORY_CARE_PROVIDER_SITE_OTHER): Payer: Medicare HMO | Admitting: Family Medicine

## 2010-10-19 ENCOUNTER — Encounter: Payer: Self-pay | Admitting: Family Medicine

## 2010-10-19 VITALS — BP 124/82 | HR 96 | Temp 98.3°F | Ht 66.0 in | Wt 236.8 lb

## 2010-10-19 DIAGNOSIS — R809 Proteinuria, unspecified: Secondary | ICD-10-CM

## 2010-10-19 DIAGNOSIS — M549 Dorsalgia, unspecified: Secondary | ICD-10-CM

## 2010-10-19 LAB — POCT URINALYSIS DIPSTICK
Bilirubin, UA: NEGATIVE
Blood, UA: NEGATIVE
Glucose, UA: NEGATIVE
Ketones, UA: NEGATIVE
Leukocytes, UA: NEGATIVE
Nitrite, UA: NEGATIVE
Spec Grav, UA: 1.015
Urobilinogen, UA: NEGATIVE
pH, UA: 5

## 2010-10-19 MED ORDER — TRAMADOL HCL 50 MG PO TABS: ORAL_TABLET | ORAL | Status: DC

## 2010-10-19 MED ORDER — ALPRAZOLAM 0.5 MG PO TABS: ORAL_TABLET | ORAL | Status: DC

## 2010-10-19 NOTE — Progress Notes (Signed)
Subjective:    Patient ID: Kayla Mayo, female    DOB: [birth year], 55 y.o.   MRN: [id number]  HPI 55 yo here for ?protein in urine. Has always had trace protein in her urine but for past several weeks, increased left lower back pain. Muscle relaxants help with pain. Wants to make sure her kidneys aren't making more protein. No dysuria, hematuria, nausea, vomiting or fevers. Seeing OBGYN for uterine bleeding,s/p endometrial biospy (awaitining results).  Patient Active Problem List  Diagnoses  . DM  . HYPERLIPIDEMIA  . OTHER SPECIFIED ANEMIAS  . ADJ DISORDER WITH MIXED ANXIETY & DEPRESSED MOOD  . ESSENTIAL HYPERTENSION, BENIGN  . DYSPEPSIA&OTHER SPEC DISORDERS FUNCTION STOMACH  . GASTROENTERITIS WITHOUT DEHYDRATION  . CONSTIPATION  . DRY SKIN  . ARTHRITIS, KNEES, BILATERAL  . DYSPHAGIA UNSPECIFIED  . SHOULDER PAIN, RIGHT  . Diabetes mellitus with peripheral autonomic neuropathy  . Weight gain   Past Medical History  Diagnosis Date  . Anxiety   . Diabetes mellitus   . GERD (gastroesophageal reflux disease)   . Hyperlipidemia   . Hypertension   . Anal fissure   . Arthritis   . Chronic headaches   . Colon polyps   . Depression   . Gallstones   . IBS (irritable bowel syndrome)   . Obesity   . Status post cardiac catheterization    Past Surgical History  Procedure Date  . Appendectomy   . Cholecystectomy   . Gastric bypass   . Lower back surgery    History  Substance Use Topics  . Smoking status: Never Smoker   . Smokeless tobacco: Not on file  . Alcohol Use: No   Family History  Problem Relation Age of Onset  . Diabetes Mother   . Diabetes Brother   . Diabetes Maternal Aunt   . Diabetes Maternal Uncle   . Diabetes Maternal Grandmother    Allergies  Allergen Reactions  . Codeine   . Corn-Containing Products   . Peanut-Containing Drug Products    Current Outpatient Prescriptions on File Prior to Visit  Medication Sig Dispense Refill  . aspirin 81  MG tablet Take 81 mg by mouth daily.        Marland Kitchen azelastine (ASTELIN) 137 MCG/SPRAY nasal spray 1 spray by Nasal route daily. Use in each nostril as directed       . Calcium Carbonate-Vitamin D (TH CALCIUM CARBONATE-VITAMIN D) 600-400 MG-UNIT per tablet Take 1 tablet by mouth daily.        . cyanocobalamin (,VITAMIN B-12,) 1000 MCG/ML injection Inject 1,000 mcg into the muscle every 30 (thirty) days.        . cyclobenzaprine (FLEXERIL) 10 MG tablet Take 10 mg by mouth 3 (three) times daily as needed.        . enalapril (VASOTEC) 20 MG tablet Take 20 mg by mouth daily.        Marland Kitchen estradiol (VIVELLE-DOT) 0.1 MG/24HR Apply one patch every three days       . estradiol-norethindrone (COMBIPATCH) 0.05-0.14 MG/DAY Use one patch every three days       . fentaNYL (DURAGESIC) 25 MCG/HR Place 1 patch (25 mcg total) onto the skin every 3 (three) days.  5 patch  0  . fexofenadine (ALLEGRA) 180 MG tablet Take 180 mg by mouth daily.        . fish oil-omega-3 fatty acids 1000 MG capsule Take one tablet by mouth once daily       . fluocinonide (LIDEX) 0.05 %  external solution Use as needed       . fluticasone (FLONASE) 50 MCG/ACT nasal spray 1 spray by Nasal route daily.        . folic acid (FOLVITE) 400 MCG tablet Take three tablets by mouth once daily       . furosemide (LASIX) 20 MG tablet Take 20 mg by mouth 2 (two) times daily.        . hydrocortisone (ANUSOL-HC) 2.5 % rectal cream Apply as needed.  84 g  0  . ibuprofen (ADVIL,MOTRIN) 800 MG tablet Take 800 mg by mouth 3 (three) times daily as needed.        . lubiprostone (AMITIZA) 24 MCG capsule Take 24 mcg by mouth 2 (two) times daily with a meal.        . meclizine (ANTIVERT) 25 MG tablet Take 25 mg by mouth 3 (three) times daily as needed.        . meloxicam (MOBIC) 15 MG tablet Take 15 mg by mouth daily.        . metFORMIN (GLUCOPHAGE) 500 MG tablet Take 1 tablet (500 mg total) by mouth 2 (two) times daily with a meal.  60 tablet  11  . NON FORMULARY  Allergy injections: not given at our office       . omeprazole (PRILOSEC) 20 MG capsule Take 20 mg by mouth 2 (two) times daily.        Marland Kitchen POLYETHYLENE GLYCOL 3350 PO Use one scoop twice a day       . potassium chloride SA (KLOR-CON M20) 20 MEQ tablet Take 1 tablet (20 mEq total) by mouth 2 (two) times daily.  180 tablet  3  . progesterone (PROMETRIUM) 100 MG capsule Take 100 mg by mouth at bedtime.        . ranitidine (ZANTAC) 300 MG tablet Take 300 mg by mouth at bedtime.        . simvastatin (ZOCOR) 20 MG tablet Take 20 mg by mouth daily.        . verapamil (CALAN) 80 MG tablet Take 80 mg by mouth daily.        . Vitamin D, Ergocalciferol, (DRISDOL) 50000 UNITS CAPS TAKE 1 CAPSULE BY MOUTH EVERY WEEK  4 capsule  0  . DISCONTD: ALPRAZolam (XANAX) 0.5 MG tablet TAKE 1 TABLET BY MOUTH TWICE DAILY  60 tablet  0  . DISCONTD: traMADol (ULTRAM) 50 MG tablet TAKE 1 TABLET BY MOUTH TWICE DAILY AS NEEDED FOR PAIN  60 tablet  0      Review of Systems See HPI    Objective:   Physical Exam BP 124/82  Pulse 96  Temp(Src) 98.3 F (36.8 C) (Oral)  Ht 5\' 6"  (1.676 m)  Wt 236 lb 12 oz (107.389 kg)  BMI 38.21 kg/m2 Gen:  Alert, overweight appearing, NAD MSK:  No CVA tenderness, tight lumbar paraspinous muscles bilaterally Abd:  Soft, NT, pos bs Psych:  Good eye contact, not depressed or anxious appearing     Assessment & Plan:   1. Proteinuria   Stable, only trace protein in urine today. Reassurance provided. Advised to continue work up with OBGYN for post menopausal bleeding.  2. Back pain  New. Lumbar muscle spasms. Continue muscle relaxants.

## 2010-10-21 ENCOUNTER — Encounter (HOSPITAL_BASED_OUTPATIENT_CLINIC_OR_DEPARTMENT_OTHER): Payer: Medicare HMO | Admitting: Oncology

## 2010-10-21 DIAGNOSIS — E538 Deficiency of other specified B group vitamins: Secondary | ICD-10-CM

## 2010-10-21 DIAGNOSIS — D509 Iron deficiency anemia, unspecified: Secondary | ICD-10-CM

## 2010-11-11 ENCOUNTER — Other Ambulatory Visit: Payer: Self-pay | Admitting: Family Medicine

## 2010-11-12 NOTE — Telephone Encounter (Signed)
Is this ok to refill?  Is she on this medication long term?  Please advise.

## 2010-11-16 ENCOUNTER — Other Ambulatory Visit: Payer: Self-pay | Admitting: Family Medicine

## 2010-11-18 ENCOUNTER — Encounter: Payer: Medicare HMO | Admitting: Oncology

## 2010-11-18 ENCOUNTER — Encounter (HOSPITAL_BASED_OUTPATIENT_CLINIC_OR_DEPARTMENT_OTHER): Payer: Medicare HMO | Admitting: Oncology

## 2010-11-18 DIAGNOSIS — E538 Deficiency of other specified B group vitamins: Secondary | ICD-10-CM

## 2010-12-06 ENCOUNTER — Other Ambulatory Visit: Payer: Self-pay | Admitting: Family Medicine

## 2010-12-06 DIAGNOSIS — Z1231 Encounter for screening mammogram for malignant neoplasm of breast: Secondary | ICD-10-CM

## 2010-12-14 ENCOUNTER — Other Ambulatory Visit: Payer: Self-pay | Admitting: Family Medicine

## 2010-12-16 ENCOUNTER — Encounter (HOSPITAL_BASED_OUTPATIENT_CLINIC_OR_DEPARTMENT_OTHER): Payer: Medicare HMO | Admitting: Oncology

## 2010-12-16 DIAGNOSIS — E538 Deficiency of other specified B group vitamins: Secondary | ICD-10-CM

## 2010-12-27 ENCOUNTER — Ambulatory Visit
Admission: RE | Admit: 2010-12-27 | Discharge: 2010-12-27 | Disposition: A | Discharge status: Home / Self Care | Payer: Medicare HMO | Source: Ambulatory Visit | Attending: Family Medicine | Admitting: Family Medicine

## 2010-12-27 DIAGNOSIS — Z1231 Encounter for screening mammogram for malignant neoplasm of breast: Secondary | ICD-10-CM

## 2010-12-28 ENCOUNTER — Other Ambulatory Visit: Payer: Self-pay | Admitting: Family Medicine

## 2010-12-30 ENCOUNTER — Encounter: Payer: Self-pay | Admitting: *Deleted

## 2011-01-15 ENCOUNTER — Other Ambulatory Visit: Payer: Self-pay | Admitting: Family Medicine

## 2011-01-20 ENCOUNTER — Encounter (HOSPITAL_BASED_OUTPATIENT_CLINIC_OR_DEPARTMENT_OTHER): Payer: Medicare HMO | Admitting: Oncology

## 2011-01-20 DIAGNOSIS — E538 Deficiency of other specified B group vitamins: Secondary | ICD-10-CM

## 2011-02-05 ENCOUNTER — Inpatient Hospital Stay (INDEPENDENT_AMBULATORY_CARE_PROVIDER_SITE_OTHER)
Admission: RE | Admit: 2011-02-05 | Discharge: 2011-02-05 | Disposition: A | Discharge status: Home / Self Care | Payer: Medicare HMO | Source: Ambulatory Visit | Attending: Family Medicine | Admitting: Family Medicine

## 2011-02-05 DIAGNOSIS — T5491XA Toxic effect of unspecified corrosive substance, accidental (unintentional), initial encounter: Secondary | ICD-10-CM

## 2011-02-05 DIAGNOSIS — T300 Burn of unspecified body region, unspecified degree: Secondary | ICD-10-CM

## 2011-02-17 ENCOUNTER — Encounter (HOSPITAL_BASED_OUTPATIENT_CLINIC_OR_DEPARTMENT_OTHER): Payer: Medicare HMO | Admitting: Oncology

## 2011-02-17 DIAGNOSIS — E538 Deficiency of other specified B group vitamins: Secondary | ICD-10-CM

## 2011-03-03 ENCOUNTER — Other Ambulatory Visit: Payer: Self-pay | Admitting: Family Medicine

## 2011-03-04 NOTE — Telephone Encounter (Signed)
Rx for Alprazolam called to [facility]. 

## 2011-03-17 ENCOUNTER — Ambulatory Visit (HOSPITAL_BASED_OUTPATIENT_CLINIC_OR_DEPARTMENT_OTHER): Payer: Medicare HMO | Admitting: Oncology

## 2011-03-17 ENCOUNTER — Telehealth: Payer: Self-pay | Admitting: Oncology

## 2011-03-17 ENCOUNTER — Other Ambulatory Visit: Payer: Self-pay | Admitting: Oncology

## 2011-03-17 ENCOUNTER — Other Ambulatory Visit (HOSPITAL_BASED_OUTPATIENT_CLINIC_OR_DEPARTMENT_OTHER): Payer: Medicare HMO | Admitting: Lab

## 2011-03-17 ENCOUNTER — Encounter: Payer: Self-pay | Admitting: Oncology

## 2011-03-17 VITALS — BP 120/80 | HR 102 | Temp 98.3°F | Ht 65.9 in | Wt 238.1 lb

## 2011-03-17 DIAGNOSIS — D649 Anemia, unspecified: Secondary | ICD-10-CM

## 2011-03-17 DIAGNOSIS — E119 Type 2 diabetes mellitus without complications: Secondary | ICD-10-CM

## 2011-03-17 DIAGNOSIS — E538 Deficiency of other specified B group vitamins: Secondary | ICD-10-CM

## 2011-03-17 HISTORY — DX: Anemia, unspecified: D64.9

## 2011-03-17 LAB — CBC WITH DIFFERENTIAL/PLATELET
BASO%: 0.6 % (ref 0.0–2.0)
Eosinophils Absolute: 0.1 10*3/uL (ref 0.0–0.5)
LYMPH%: 33 % (ref 14.0–49.7)
MCHC: 33.1 g/dL (ref 31.5–36.0)
MCV: 94.8 fL (ref 79.5–101.0)
MONO%: 5.9 % (ref 0.0–14.0)
Platelets: 249 10*3/uL (ref 145–400)
RBC: 3.87 10*6/uL (ref 3.70–5.45)

## 2011-03-17 LAB — VITAMIN B12: Vitamin B-12: 644 pg/mL (ref 211–911)

## 2011-03-17 MED ORDER — CYANOCOBALAMIN 1000 MCG/ML IJ SOLN
1000.0000 ug | Freq: Once | INTRAMUSCULAR | Status: AC
  Administered 2011-03-17: 1000 ug via INTRAMUSCULAR

## 2011-03-17 NOTE — Telephone Encounter (Signed)
Gv pt appt for dec-may2013

## 2011-03-17 NOTE — Progress Notes (Signed)
OFFICE PROGRESS NOTE  CC Kayla Mannan, MD, MD [address] [place] [address] [place] [address]  DIAGNOSIS:55 -year-old female with iron deficiency and B12 deficiency anemia.  PRIOR THERAPY: She has been receiving B12 injections. She has also received iron in the past.  CURRENT THERAPY: B12 injections Q4 weekly.  INTERVAL HISTORY: Kayla Mayo 55 y.o. female returns for followup visit. Overall she's doing well. She does tell me that she has is more fatigued and tired. She is denying any nausea or vomiting or fevers. She has no chills. She has not had any bleeding problems. His no hematuria hematochezia melena hemoptysis hematemesis. No peripheral paresthesias or difficulty in walking.  MEDICAL HISTORY: Past Medical History  Diagnosis Date  . Anxiety   . Diabetes mellitus   . GERD (gastroesophageal reflux disease)   . Hyperlipidemia   . Hypertension   . Anal fissure   . Arthritis   . Chronic headaches   . Colon polyps   . Depression   . Gallstones   . IBS (irritable bowel syndrome)   . Obesity   . Status post cardiac catheterization   . Anemia 03/17/2011    ALLERGIES:  is allergic to chicken allergy; codeine; corn-containing products; peanut-containing drug products; sesame oil; and wheat.  MEDICATIONS:  Current Outpatient Prescriptions  Medication Sig Dispense Refill  . ALPRAZolam (XANAX) 0.5 MG tablet TAKE 1 TABLET BY MOUTH TWICE DAILY  60 tablet  0  . aspirin 81 MG tablet Take 81 mg by mouth daily.        Marland Kitchen azelastine (ASTELIN) 137 MCG/SPRAY nasal spray 1 spray by Nasal route daily. Use in each nostril as directed       . Calcium Carbonate-Vitamin D (TH CALCIUM CARBONATE-VITAMIN D) 600-400 MG-UNIT per tablet Take 1 tablet by mouth daily.        . cyanocobalamin (,VITAMIN B-12,) 1000 MCG/ML injection Inject 1,000 mcg into the muscle every 30 (thirty) days.        . cyclobenzaprine (FLEXERIL) 10 MG tablet Take 10 mg by mouth 3 (three) times daily as  needed.        . enalapril (VASOTEC) 20 MG tablet Take 20 mg by mouth daily.        Marland Kitchen estradiol (VIVELLE-DOT) 0.1 MG/24HR Apply one patch every three days       . fentaNYL (DURAGESIC) 25 MCG/HR Place 1 patch (25 mcg total) onto the skin every 3 (three) days.  5 patch  0  . fexofenadine (ALLEGRA) 180 MG tablet Take 180 mg by mouth daily.        . fluocinonide (LIDEX) 0.05 % external solution Use as needed      . fluticasone (FLONASE) 50 MCG/ACT nasal spray 1 spray by Nasal route daily.        . folic acid (FOLVITE) 400 MCG tablet Take three tablets by mouth once daily       . furosemide (LASIX) 20 MG tablet Take 20 mg by mouth 2 (two) times daily.        . hydrocortisone (ANUSOL-HC) 2.5 % rectal cream Apply as needed.  84 g  0  . lubiprostone (AMITIZA) 24 MCG capsule Take 24 mcg by mouth 2 (two) times daily with a meal.       . meclizine (ANTIVERT) 25 MG tablet Take 25 mg by mouth 3 (three) times daily as needed.        . metFORMIN (GLUCOPHAGE) 500 MG tablet Take 1 tablet (500 mg  total) by mouth 2 (two) times daily with a meal.  60 tablet  11  . NON FORMULARY Allergy injections: not given at our office      . omeprazole (PRILOSEC) 20 MG capsule Take 20 mg by mouth 2 (two) times daily.        Marland Kitchen POLYETHYLENE GLYCOL 3350 PO Use one scoop twice a day       . polyethylene glycol powder (GLYCOLAX/MIRALAX) powder USE 1 SCOOP TWICE DAILY  1581 g  0  . potassium chloride SA (KLOR-CON M20) 20 MEQ tablet Take 1 tablet (20 mEq total) by mouth 2 (two) times daily.  180 tablet  3  . progesterone (PROMETRIUM) 100 MG capsule Take 100 mg by mouth at bedtime.        . ranitidine (ZANTAC) 300 MG tablet Take 300 mg by mouth at bedtime.        . simvastatin (ZOCOR) 20 MG tablet Take 20 mg by mouth daily.        . traMADol (ULTRAM) 50 MG tablet TAKE 1 TABLET BY MOUTH TWICE DAILY AS NEEDED FOR PAIN  60 tablet  0  . verapamil (CALAN) 80 MG tablet Take 80 mg by mouth daily.        . Vitamin D, Ergocalciferol, (DRISDOL)  50000 UNITS CAPS TAKE 1 CAPSULE BY MOUTH EVERY WEEK  4 capsule  1  . estradiol-norethindrone (COMBIPATCH) 0.05-0.14 MG/DAY Use one patch every three days       . ibuprofen (ADVIL,MOTRIN) 800 MG tablet Take 800 mg by mouth 3 (three) times daily as needed.         Current Facility-Administered Medications  Medication Dose Route Frequency Provider Last Rate Last Dose  . cyanocobalamin ((VITAMIN B-12)) injection 1,000 mcg  1,000 mcg Intramuscular Once Victorino December, MD   1,000 mcg at 03/17/11 1145    SURGICAL HISTORY:  Past Surgical History  Procedure Date  . Appendectomy   . Cholecystectomy   . Gastric bypass   . Lower back surgery     REVIEW OF SYSTEMS:  Constitutional: positive for fatigue and malaise Respiratory: negative Cardiovascular: negative Gastrointestinal: negative Hematologic/lymphatic: positive for easy bruising Musculoskeletal:positive for arthralgias and muscle weakness Neurological: negative   PHYSICAL EXAMINATION: General appearance: alert, cooperative, appears older than stated age, fatigued and moderately obese Neck: no adenopathy, no carotid bruit, no JVD, supple, symmetrical, trachea midline and thyroid not enlarged, symmetric, no tenderness/mass/nodules Lymph nodes: Cervical, supraclavicular, and axillary nodes normal. Resp: diminished breath sounds bibasilar Back: symmetric, no curvature. ROM normal. No CVA tenderness. Cardio: regular rate and rhythm, S1, S2 normal, no murmur, click, rub or gallop GI: soft, non-tender; bowel sounds normal; no masses,  no organomegaly Extremities: extremities normal, atraumatic, no cyanosis or edema Neurologic: Alert and oriented X 3, normal strength and tone. Normal symmetric reflexes. Normal coordination and gait Sensory: normal Motor: grossly normal  ECOG PERFORMANCE STATUS: 1 - Symptomatic but completely ambulatory  Blood pressure 120/80, pulse 102, temperature 98.3 F (36.8 C), height 5' 5.9" (1.674 m), weight 238 lb  1.6 oz (108.001 kg).  LABORATORY DATA: Lab Results  Component Value Date   WBC 5.5 03/17/2011   HGB 12.1 03/17/2011   HCT 36.7 03/17/2011   MCV 94.8 03/17/2011   PLT 249 03/17/2011      Chemistry      Component Value Date/Time   NA 141 08/24/2010 0822   K 4.4 08/24/2010 0822   CL 106 08/24/2010 0822   CO2 27 08/24/2010 1610  BUN 14 08/24/2010 0822   CREATININE 0.8 08/24/2010 0822      Component Value Date/Time   CALCIUM 9.1 08/24/2010 0822   ALKPHOS 85 05/18/2010 1405   AST 15 05/18/2010 1405   ALT 17 05/18/2010 1405   BILITOT 0.7 05/18/2010 1405       RADIOGRAPHIC STUDIES:  No results found.  ASSESSMENT: 55 year old female with   #1 history of iron deficiency anemia.   #2 B12 deficiency  #3 I obese with peripheral neuropathy.  #4 multiple chronic diseases.   PLAN: She'll proceed with B12 injections today we will continue to monitor her iron levels. Her CBC looks pretty good with a hemoglobin of 12.1 and a hematocrit 36.7. He will continue to followup with me on every 6 month basis. She will return for her injections on a monthly basis.   All questions were answered. The patient knows to call the clinic with any problems, questions or concerns. We can certainly see the patient much sooner if necessary.  I spent 20 minutes counseling the patient face to face. The total time spent in the appointment was 30 minutes.    Drue Second, MD Medical/Oncology [facility] [phone] (beeper) [phone] (Office)  03/17/2011, 1:43 PM

## 2011-03-18 ENCOUNTER — Other Ambulatory Visit: Payer: Self-pay | Admitting: Family Medicine

## 2011-03-22 ENCOUNTER — Other Ambulatory Visit: Payer: Self-pay | Admitting: *Deleted

## 2011-03-22 MED ORDER — RANITIDINE HCL 300 MG PO TABS: 300.0000 mg | ORAL_TABLET | Freq: Every day | ORAL | Status: DC

## 2011-03-22 MED ORDER — VERAPAMIL HCL 80 MG PO TABS: 80.0000 mg | ORAL_TABLET | Freq: Every day | ORAL | Status: DC

## 2011-03-22 MED ORDER — FUROSEMIDE 20 MG PO TABS: 20.0000 mg | ORAL_TABLET | Freq: Two times a day (BID) | ORAL | Status: DC

## 2011-03-22 MED ORDER — OMEPRAZOLE 20 MG PO CPDR: 20.0000 mg | DELAYED_RELEASE_CAPSULE | Freq: Two times a day (BID) | ORAL | Status: DC

## 2011-03-22 MED ORDER — FLUTICASONE PROPIONATE 50 MCG/ACT NA SUSP: 1.0000 | Freq: Every day | NASAL | Status: DC

## 2011-03-22 NOTE — Telephone Encounter (Signed)
Ok to refill but needs to be seen so we can check labs and update her med list.

## 2011-03-22 NOTE — Telephone Encounter (Signed)
Pt is requesting refills on these meds be sent to [facility].  She has not seen you for a check up lately.  She is also requesting enalapril, I dont see that on med list.

## 2011-03-22 NOTE — Telephone Encounter (Signed)
Patient advised via telephone, appt scheduled for tomorrow morning at 8:15 with Dr. Dayton Martes.  Medications sent to [facility] pharmacy, 90 day supply.

## 2011-03-23 ENCOUNTER — Ambulatory Visit (INDEPENDENT_AMBULATORY_CARE_PROVIDER_SITE_OTHER): Payer: Medicare HMO | Admitting: Family Medicine

## 2011-03-23 ENCOUNTER — Encounter: Payer: Self-pay | Admitting: Family Medicine

## 2011-03-23 VITALS — BP 122/78 | HR 64 | Temp 98.1°F | Ht 66.0 in | Wt 239.0 lb

## 2011-03-23 DIAGNOSIS — E559 Vitamin D deficiency, unspecified: Secondary | ICD-10-CM | POA: Insufficient documentation

## 2011-03-23 DIAGNOSIS — G4700 Insomnia, unspecified: Secondary | ICD-10-CM

## 2011-03-23 DIAGNOSIS — E785 Hyperlipidemia, unspecified: Secondary | ICD-10-CM

## 2011-03-23 DIAGNOSIS — E119 Type 2 diabetes mellitus without complications: Secondary | ICD-10-CM

## 2011-03-23 DIAGNOSIS — I10 Essential (primary) hypertension: Secondary | ICD-10-CM

## 2011-03-23 LAB — LIPID PANEL
HDL: 69.6 mg/dL (ref >39.00)
LDL Cholesterol: 100 mg/dL — ABNORMAL HIGH (ref 0–99)
Total CHOL/HDL Ratio: 3
VLDL: 13.8 mg/dL (ref 0.0–40.0)

## 2011-03-23 LAB — BASIC METABOLIC PANEL
Calcium: 8.8 mg/dL (ref 8.4–10.5)
GFR: 86.89 mL/min (ref >60.00)
Potassium: 4 mEq/L (ref 3.5–5.1)
Sodium: 140 mEq/L (ref 135–145)

## 2011-03-23 MED ORDER — ZOLPIDEM TARTRATE 5 MG PO TABS: 5.0000 mg | ORAL_TABLET | Freq: Every evening | ORAL | Status: DC | PRN

## 2011-03-23 NOTE — Progress Notes (Signed)
Addended by: Alvina Chou on: 03/23/2011 11:48 AM   Modules accepted: Orders

## 2011-03-23 NOTE — Progress Notes (Signed)
Subjective:    Patient ID: Kayla Mayo, female    DOB: Sep 24, 1955, 55 y.o.   MRN: [id number]  HPI 55 yo with multiple medical issues here to for follow up DM.  Patient Active Problem List  Diagnoses  . DM  . HYPERLIPIDEMIA  . OTHER SPECIFIED ANEMIAS  . ADJ DISORDER WITH MIXED ANXIETY & DEPRESSED MOOD  . ESSENTIAL HYPERTENSION, BENIGN  . DYSPEPSIA&OTHER SPEC DISORDERS FUNCTION STOMACH  . GASTROENTERITIS WITHOUT DEHYDRATION  . CONSTIPATION  . DRY SKIN  . ARTHRITIS, KNEES, BILATERAL  . DYSPHAGIA UNSPECIFIED  . SHOULDER PAIN, RIGHT  . Diabetes mellitus with peripheral autonomic neuropathy  . Weight gain  . Proteinuria  . Back pain  . Anemia  . Vitamin D deficiency   Past Medical History  Diagnosis Date  . Anxiety   . Diabetes mellitus   . GERD (gastroesophageal reflux disease)   . Hyperlipidemia   . Hypertension   . Anal fissure   . Arthritis   . Chronic headaches   . Colon polyps   . Depression   . Gallstones   . IBS (irritable bowel syndrome)   . Obesity   . Status post cardiac catheterization   . Anemia 03/17/2011   Past Surgical History  Procedure Date  . Appendectomy   . Cholecystectomy   . Gastric bypass   . Lower back surgery    History  Substance Use Topics  . Smoking status: Never Smoker   . Smokeless tobacco: Not on file  . Alcohol Use: No   Family History  Problem Relation Age of Onset  . Diabetes Mother   . Diabetes Brother   . Diabetes Maternal Aunt   . Diabetes Maternal Uncle   . Diabetes Maternal Grandmother    Allergies  Allergen Reactions  . Chicken Allergy     Sinus drainage  . Codeine   . Corn-Containing Products   . Peanut-Containing Drug Products   . Sesame Oil     Sinus drainage  . Wheat     Sinus drainage   Current Outpatient Prescriptions on File Prior to Visit  Medication Sig Dispense Refill  . ALPRAZolam (XANAX) 0.5 MG tablet TAKE 1 TABLET BY MOUTH TWICE DAILY  60 tablet  0  . aspirin 81 MG tablet Take 81 mg  by mouth daily.        Marland Kitchen azelastine (ASTELIN) 137 MCG/SPRAY nasal spray 1 spray by Nasal route daily. Use in each nostril as directed       . cyanocobalamin (,VITAMIN B-12,) 1000 MCG/ML injection Inject 1,000 mcg into the muscle every 30 (thirty) days.        . cyclobenzaprine (FLEXERIL) 10 MG tablet Take 10 mg by mouth 3 (three) times daily as needed.        . enalapril (VASOTEC) 20 MG tablet Take 20 mg by mouth daily.        Marland Kitchen estradiol (VIVELLE-DOT) 0.1 MG/24HR Place 1 patch onto the skin daily.       Marland Kitchen estradiol-norethindrone (COMBIPATCH) 0.05-0.14 MG/DAY Use one patch every three days      . fentaNYL (DURAGESIC) 25 MCG/HR Place 1 patch (25 mcg total) onto the skin every 3 (three) days.  5 patch  0  . fexofenadine (ALLEGRA) 180 MG tablet Take 180 mg by mouth daily.        . fluocinonide (LIDEX) 0.05 % external solution Use as needed      . fluticasone (FLONASE) 50 MCG/ACT nasal spray Place 1 spray into the  nose daily.  48 g  3  . folic acid (FOLVITE) 400 MCG tablet Take three tablets by mouth once daily       . furosemide (LASIX) 20 MG tablet Take 1 tablet (20 mg total) by mouth 2 (two) times daily.  180 tablet  3  . ibuprofen (ADVIL,MOTRIN) 800 MG tablet Take 800 mg by mouth 3 (three) times daily as needed.        . lubiprostone (AMITIZA) 24 MCG capsule Take 24 mcg by mouth 2 (two) times daily with a meal.       . meclizine (ANTIVERT) 25 MG tablet Take 25 mg by mouth 3 (three) times daily as needed.        . metFORMIN (GLUCOPHAGE) 500 MG tablet Take 1 tablet (500 mg total) by mouth 2 (two) times daily with a meal.  60 tablet  11  . NON FORMULARY Allergy injections: not given at our office      . omeprazole (PRILOSEC) 20 MG capsule Take 1 capsule (20 mg total) by mouth 2 (two) times daily.  180 capsule  3  . POLYETHYLENE GLYCOL 3350 PO Use one scoop twice a day       . polyethylene glycol powder (GLYCOLAX/MIRALAX) powder USE 1 SCOOP TWICE DAILY  1581 g  0  . potassium chloride SA (KLOR-CON  M20) 20 MEQ tablet Take 1 tablet (20 mEq total) by mouth 2 (two) times daily.  180 tablet  3  . progesterone (PROMETRIUM) 100 MG capsule Take 100 mg by mouth at bedtime.        . ranitidine (ZANTAC) 300 MG tablet Take 1 tablet (300 mg total) by mouth at bedtime.  90 tablet  3  . simvastatin (ZOCOR) 20 MG tablet Take 20 mg by mouth daily.        . traMADol (ULTRAM) 50 MG tablet TAKE 1 TABLET BY MOUTH TWICE DAILY AS NEEDED FOR PAIN  60 tablet  0  . verapamil (CALAN) 80 MG tablet Take 1 tablet (80 mg total) by mouth daily.  90 tablet  3  . Vitamin D, Ergocalciferol, (DRISDOL) 50000 UNITS CAPS TAKE 1 CAPSULE BY MOUTH EVERY WEEK  4 capsule  1     Diabetes:  Using medications without difficulties: yes Tried oral medications in past but she was afraid they would make them gain weight so has only been on insulin. Hypoglycemic episodes: no, CBGs ranging 80-110 fasting. Hyperglycemic episodes: no Feet problems: yes Blood Sugars averaging: 120 eye exam within last year: yes   Lab Results  Component Value Date   HGBA1C 6.2 08/24/2010   Insomnia- Xanax not helping her stay asleep.  Would like to try another sleep aid.  Review of Systems  See HPI     Objective:   Physical Exam BP 122/78  Pulse 64  Temp(Src) 98.1 F (36.7 C) (Oral)  Ht 5\' 6"  (1.676 m)  Wt 239 lb (108.41 kg)  BMI 38.58 kg/m2   General:  Well-developed,well-nourished,in no acute distress; alert,appropriate and cooperative throughout  Head:  normocephalic and atraumatic.   Eyes:  vision grossly intact, pupils equal, pupils round, and pupils reactive to light.   Ears:  R ear normal and L ear normal.   Nose:  no external deformity.   Mouth:  good dentition.   Lungs:  Normal respiratory effort, chest expands symmetrically. Lungs are clear to auscultation, no crackles or wheezes. Heart:  Normal rate and regular rhythm. S1 and S2 normal without gallop, murmur, click, rub or  other extra sounds. Abdomen:  obese, soft,  NT Extremities:  no edema Diabetic foot exam: Normal inspection No skin breakdown No calluses  Normal DP pulses Normal sensation to light tough and monofilament Nails normal Psych:  normal affect,  pleasant, good eye contact.    Assessment & Plan:   1. DM  Stable.  Continue current dose of Metformin. Recheck a1c. HgB A1c  2. Insomnia  Deteriorated.  Will try Ambien (advised not to take with Xanax).

## 2011-03-23 NOTE — Patient Instructions (Signed)
Good to see you!    Have a wonderful holiday!

## 2011-04-03 ENCOUNTER — Other Ambulatory Visit: Payer: Self-pay | Admitting: Family Medicine

## 2011-04-04 NOTE — Telephone Encounter (Signed)
Rx for Alprazolam called to [facility].

## 2011-04-15 ENCOUNTER — Other Ambulatory Visit: Payer: Self-pay | Admitting: Oncology

## 2011-04-15 ENCOUNTER — Telehealth: Payer: Self-pay | Admitting: Internal Medicine

## 2011-04-15 ENCOUNTER — Ambulatory Visit (HOSPITAL_BASED_OUTPATIENT_CLINIC_OR_DEPARTMENT_OTHER): Payer: Medicare HMO

## 2011-04-15 ENCOUNTER — Other Ambulatory Visit (HOSPITAL_BASED_OUTPATIENT_CLINIC_OR_DEPARTMENT_OTHER): Payer: Medicare HMO

## 2011-04-15 VITALS — BP 124/85 | HR 84 | Temp 99.2°F

## 2011-04-15 DIAGNOSIS — D649 Anemia, unspecified: Secondary | ICD-10-CM

## 2011-04-15 DIAGNOSIS — E538 Deficiency of other specified B group vitamins: Secondary | ICD-10-CM

## 2011-04-15 LAB — COMPREHENSIVE METABOLIC PANEL
ALT: 10 U/L (ref 0–35)
AST: 17 U/L (ref 0–37)
Creatinine, Ser: 0.8 mg/dL (ref 0.50–1.10)
Total Bilirubin: 0.6 mg/dL (ref 0.3–1.2)

## 2011-04-15 LAB — CBC WITH DIFFERENTIAL/PLATELET
Basophils Absolute: 0.1 10*3/uL (ref 0.0–0.1)
EOS%: 1.5 % (ref 0.0–7.0)
HCT: 36.7 % (ref 34.8–46.6)
HGB: 12.1 g/dL (ref 11.6–15.9)
MCH: 30.9 pg (ref 25.1–34.0)
MCV: 93.9 fL (ref 79.5–101.0)
MONO%: 3.8 % (ref 0.0–14.0)
NEUT%: 58.3 % (ref 38.4–76.8)

## 2011-04-15 LAB — VITAMIN B12: Vitamin B-12: 607 pg/mL (ref 211–911)

## 2011-04-15 MED ORDER — CYANOCOBALAMIN 1000 MCG/ML IJ SOLN
1000.0000 ug | Freq: Once | INTRAMUSCULAR | Status: AC
  Administered 2011-04-15: 1000 ug via INTRAMUSCULAR

## 2011-04-19 ENCOUNTER — Other Ambulatory Visit: Payer: Self-pay | Admitting: Family Medicine

## 2011-04-20 ENCOUNTER — Telehealth: Payer: Self-pay | Admitting: *Deleted

## 2011-04-20 NOTE — Telephone Encounter (Signed)
Message copied by Cooper Render on Wed Apr 20, 2011  3:24 PM ------      Message from: Victorino December      Created: Tue Apr 19, 2011 11:50 PM       Call patient: b12 normal

## 2011-04-20 NOTE — Telephone Encounter (Signed)
Pt.notified

## 2011-04-21 ENCOUNTER — Other Ambulatory Visit: Payer: Self-pay | Admitting: Internal Medicine

## 2011-04-21 MED ORDER — ENALAPRIL MALEATE 20 MG PO TABS: 20.0000 mg | ORAL_TABLET | Freq: Every day | ORAL | Status: DC

## 2011-04-21 NOTE — Telephone Encounter (Signed)
Rx called in to pharmacy. 

## 2011-05-01 ENCOUNTER — Other Ambulatory Visit: Payer: Self-pay | Admitting: Family Medicine

## 2011-05-06 ENCOUNTER — Telehealth: Payer: Self-pay | Admitting: Family Medicine

## 2011-05-06 NOTE — Telephone Encounter (Signed)
Called for antibiotic with symptoms.  Please call to schedule an appt.

## 2011-05-11 NOTE — Telephone Encounter (Signed)
Opened in error by Pam Clement 

## 2011-05-12 ENCOUNTER — Other Ambulatory Visit: Payer: Self-pay | Admitting: Family Medicine

## 2011-05-13 ENCOUNTER — Ambulatory Visit (HOSPITAL_BASED_OUTPATIENT_CLINIC_OR_DEPARTMENT_OTHER): Payer: Medicare Other

## 2011-05-13 VITALS — BP 122/80 | HR 106 | Temp 99.4°F

## 2011-05-13 DIAGNOSIS — D649 Anemia, unspecified: Secondary | ICD-10-CM

## 2011-05-13 DIAGNOSIS — E538 Deficiency of other specified B group vitamins: Secondary | ICD-10-CM

## 2011-05-13 MED ORDER — CYANOCOBALAMIN 1000 MCG/ML IJ SOLN
1000.0000 ug | Freq: Once | INTRAMUSCULAR | Status: AC
  Administered 2011-05-13: 1000 ug via INTRAMUSCULAR

## 2011-05-16 ENCOUNTER — Other Ambulatory Visit: Payer: Self-pay | Admitting: *Deleted

## 2011-05-16 MED ORDER — ALPRAZOLAM 0.5 MG PO TABS: 0.5000 mg | ORAL_TABLET | Freq: Two times a day (BID) | ORAL | Status: DC

## 2011-05-16 MED ORDER — VERAPAMIL HCL 80 MG PO TABS: 80.0000 mg | ORAL_TABLET | Freq: Every day | ORAL | Status: DC

## 2011-05-16 MED ORDER — METFORMIN HCL 500 MG PO TABS: 500.0000 mg | ORAL_TABLET | Freq: Two times a day (BID) | ORAL | Status: DC

## 2011-05-16 MED ORDER — PROGESTERONE MICRONIZED 100 MG PO CAPS: 100.0000 mg | ORAL_CAPSULE | Freq: Every day | ORAL | Status: DC

## 2011-05-16 MED ORDER — ENALAPRIL MALEATE 20 MG PO TABS: 20.0000 mg | ORAL_TABLET | Freq: Every day | ORAL | Status: DC

## 2011-05-16 MED ORDER — FOLIC ACID 400 MCG PO TABS: ORAL_TABLET | ORAL | Status: DC

## 2011-05-16 MED ORDER — POTASSIUM CHLORIDE CRYS ER 20 MEQ PO TBCR: 20.0000 meq | EXTENDED_RELEASE_TABLET | Freq: Two times a day (BID) | ORAL | Status: DC

## 2011-05-16 MED ORDER — CLOBETASOL PROPIONATE 0.05 % EX SOLN: CUTANEOUS | Status: DC

## 2011-05-16 MED ORDER — FLUTICASONE PROPIONATE 50 MCG/ACT NA SUSP: 1.0000 | Freq: Every day | NASAL | Status: DC

## 2011-05-16 MED ORDER — HYDROCORTISONE 2.5 % RE CREA: 1.0000 "application " | TOPICAL_CREAM | RECTAL | Status: DC | PRN

## 2011-05-16 MED ORDER — FUROSEMIDE 20 MG PO TABS: 20.0000 mg | ORAL_TABLET | Freq: Two times a day (BID) | ORAL | Status: DC

## 2011-05-16 MED ORDER — RANITIDINE HCL 300 MG PO TABS: 300.0000 mg | ORAL_TABLET | Freq: Every day | ORAL | Status: DC

## 2011-05-16 MED ORDER — OMEPRAZOLE 20 MG PO CPDR: 20.0000 mg | DELAYED_RELEASE_CAPSULE | Freq: Two times a day (BID) | ORAL | Status: DC

## 2011-05-16 MED ORDER — POLYETHYLENE GLYCOL 3350 17 GM/SCOOP PO POWD: 17.0000 g | Freq: Two times a day (BID) | ORAL | Status: DC

## 2011-05-16 MED ORDER — LUBIPROSTONE 24 MCG PO CAPS: 24.0000 ug | ORAL_CAPSULE | Freq: Two times a day (BID) | ORAL | Status: DC

## 2011-05-16 MED ORDER — SIMVASTATIN 20 MG PO TABS: 20.0000 mg | ORAL_TABLET | Freq: Every day | ORAL | Status: DC

## 2011-05-17 ENCOUNTER — Encounter: Payer: Self-pay | Admitting: *Deleted

## 2011-05-17 ENCOUNTER — Other Ambulatory Visit: Payer: Self-pay | Admitting: *Deleted

## 2011-05-17 DIAGNOSIS — M8580 Other specified disorders of bone density and structure, unspecified site: Secondary | ICD-10-CM | POA: Insufficient documentation

## 2011-05-17 MED ORDER — ESTRADIOL 0.1 MG/24HR TD PTTW: 1.0000 | MEDICATED_PATCH | TRANSDERMAL | Status: DC

## 2011-05-18 ENCOUNTER — Other Ambulatory Visit (HOSPITAL_COMMUNITY)
Admission: RE | Admit: 2011-05-18 | Discharge: 2011-05-18 | Disposition: A | Discharge status: Home / Self Care | Payer: Medicare Other | Source: Ambulatory Visit | Attending: Gynecology | Admitting: Gynecology

## 2011-05-18 ENCOUNTER — Ambulatory Visit (INDEPENDENT_AMBULATORY_CARE_PROVIDER_SITE_OTHER): Payer: Medicaid Other | Admitting: Gynecology

## 2011-05-18 ENCOUNTER — Encounter: Payer: Self-pay | Admitting: Gynecology

## 2011-05-18 VITALS — BP 112/64 | Ht 66.5 in | Wt 242.0 lb

## 2011-05-18 DIAGNOSIS — M8580 Other specified disorders of bone density and structure, unspecified site: Secondary | ICD-10-CM

## 2011-05-18 DIAGNOSIS — R32 Unspecified urinary incontinence: Secondary | ICD-10-CM

## 2011-05-18 DIAGNOSIS — M899 Disorder of bone, unspecified: Secondary | ICD-10-CM

## 2011-05-18 DIAGNOSIS — Z124 Encounter for screening for malignant neoplasm of cervix: Secondary | ICD-10-CM

## 2011-05-18 DIAGNOSIS — Z01419 Encounter for gynecological examination (general) (routine) without abnormal findings: Secondary | ICD-10-CM

## 2011-05-18 DIAGNOSIS — M949 Disorder of cartilage, unspecified: Secondary | ICD-10-CM

## 2011-05-18 DIAGNOSIS — N898 Other specified noninflammatory disorders of vagina: Secondary | ICD-10-CM

## 2011-05-18 DIAGNOSIS — Z79890 Hormone replacement therapy (postmenopausal): Secondary | ICD-10-CM

## 2011-05-18 LAB — WET PREP FOR TRICH, YEAST, CLUE: Clue Cells Wet Prep HPF POC: NONE SEEN

## 2011-05-18 MED ORDER — FLUCONAZOLE 150 MG PO TABS: 150.0000 mg | ORAL_TABLET | Freq: Once | ORAL | Status: DC

## 2011-05-18 NOTE — Progress Notes (Signed)
Makalyn Lennox [facility] 01-01-1956 [id number]        56 y.o.  for follow up. She is on HRT of Vivelle 0.1 mg patches and Prometrium 100 mg at bedtime.  Past medical history,surgical history, medications, allergies, family history and social history were all reviewed and documented in the EPIC chart. ROS:  Was performed and pertinent positives and negatives are included in the history.  Exam: Sherri chaperone present Filed Vitals:   05/18/11 1031  BP: 112/64   General appearance  Normal Skin grossly normal Head/Neck normal with no cervical or supraclavicular adenopathy thyroid normal Lungs  clear Cardiac RR, without RMG Abdominal  soft, nontender, without masses, organomegaly or hernia Breasts  examined lying and sitting without masses, retractions, discharge or axillary adenopathy. Pelvic  Ext/BUS/vagina  normal with atrophic changes thick white discharge  Cervix  normal  Pap done  Uterus  axial, normal size, shape and contour, midline and mobile nontender   Adnexa  Without masses or tenderness    Anus and perineum  normal   Rectovaginal  normal sphincter tone without palpated masses or tenderness.    Assessment/Plan:  56 y.o. female for annual exam.    1. HRT. Patient is on Vivelle 0.1 mg and Prometrium 100 mg at bedtime. We discussed HRT again risks benefits WHI study increased risk of stroke, heart attack, DVT and breast cancer. The ACOG and NAMS statements for the lowest possible dose shortest period of time. Patient's going to try to wean this winter. I gave her samples of 0.05x1 week and 0.0375x1 week and then she'll stop after that both the patches and Prometrium. We'll see how she does. If she redevelops significant symptoms then she'll call me and we'll reinitiate HRT her choice. 2. White discharge. KOH wet prep initially reported negative and I reviewed it myself and saw yeast. We'll treat with Diflucan 150x1 dose. 3. Osteopenia. DEXA March 2011 showed -1.3 T score. FRAX showed 10  your fracture risk for major osteoporotic a 2.2% and hip fracture it 0.1%. We'll plan on repeating DEXA in another year or 2. Increase calcium vitamin D. 4. Breast health. Had mammogram in November and will continue with annual mammography.  SBE monthly reviewed. 5. Colonoscopy. Patient her colonoscopy 2010 will follow up when she is due per their recommendation. 6. Pap smear. I did a Pap smear this year as she has only have to in her chart assuming this returns normal that will make 3 and I will discuss less frequent screening intervals. 7. Urinary incontinence. Patient had been on Vesicare but stopped it due to constipation. She asked me about Myrbetriq. I have no experience with this medication but actually the articles written by Dr. Roselee Nova I asked patient to call their office to make appointment to see him she's going to do that. 8. Health maintenance. The blood work was done today is all done through her primary physician who follows her for her medical issues who she sees a regular basis.   [name] MD, 11:14 AM 05/18/2011

## 2011-05-18 NOTE — Patient Instructions (Signed)
Discontinue hormone therapy as we discussed. If without significant symptoms and will follow. If hot flashes or other symptoms redevelop call me.

## 2011-05-19 NOTE — Telephone Encounter (Signed)
Rx faxed to [facility]

## 2011-05-20 ENCOUNTER — Ambulatory Visit (INDEPENDENT_AMBULATORY_CARE_PROVIDER_SITE_OTHER): Payer: Medicare Other | Admitting: Family Medicine

## 2011-05-20 ENCOUNTER — Encounter: Payer: Self-pay | Admitting: Family Medicine

## 2011-05-20 VITALS — BP 118/80 | HR 95 | Temp 99.2°F | Wt 238.5 lb

## 2011-05-20 DIAGNOSIS — J069 Acute upper respiratory infection, unspecified: Secondary | ICD-10-CM

## 2011-05-20 MED ORDER — AMOXICILLIN-POT CLAVULANATE 875-125 MG PO TABS: 1.0000 | ORAL_TABLET | Freq: Two times a day (BID) | ORAL | Status: AC

## 2011-05-20 MED ORDER — BENZONATATE 100 MG PO CAPS: 100.0000 mg | ORAL_CAPSULE | Freq: Two times a day (BID) | ORAL | Status: AC | PRN

## 2011-05-20 NOTE — Progress Notes (Signed)
SUBJECTIVE:  Kayla Mayo is a 56 y.o. female who complains of coryza, congestion, dry cough, headache and right sinus pain for 8 days. She denies a history of anorexia and chest pain and denies a history of asthma. Patient denies smoke cigarettes.   Patient Active Problem List  Diagnoses  . DM  . HYPERLIPIDEMIA  . OTHER SPECIFIED ANEMIAS  . ADJ DISORDER WITH MIXED ANXIETY & DEPRESSED MOOD  . ESSENTIAL HYPERTENSION, BENIGN  . DYSPEPSIA&OTHER SPEC DISORDERS FUNCTION STOMACH  . GASTROENTERITIS WITHOUT DEHYDRATION  . CONSTIPATION  . DRY SKIN  . ARTHRITIS, KNEES, BILATERAL  . DYSPHAGIA UNSPECIFIED  . SHOULDER PAIN, RIGHT  . Diabetes mellitus with peripheral autonomic neuropathy  . Weight gain  . Proteinuria  . Back pain  . Anemia  . Vitamin D deficiency  . Insomnia  . Osteopenia   Past Medical History  Diagnosis Date  . Anxiety   . Diabetes mellitus   . GERD (gastroesophageal reflux disease)   . Hyperlipidemia   . Hypertension   . Anal fissure   . Arthritis   . Chronic headaches   . Colon polyps   . Depression   . Gallstones   . IBS (irritable bowel syndrome)   . Obesity   . Status post cardiac catheterization   . Anemia 03/17/2011  . Osteopenia 3/11    -1.3   Past Surgical History  Procedure Date  . Appendectomy   . Cholecystectomy   . Gastric bypass   . Lower back surgery   . Left shouler surgery   . Tubal ligation   . Bilareral knee scopes    History  Substance Use Topics  . Smoking status: Never Smoker   . Smokeless tobacco: Never Used  . Alcohol Use: No   Family History  Problem Relation Age of Onset  . Diabetes Mother   . Stroke Mother   . Heart disease Mother   . Diabetes Brother   . Diabetes Maternal Aunt   . Diabetes Maternal Uncle   . Diabetes Maternal Grandmother    Allergies  Allergen Reactions  . Chicken Allergy     Sinus drainage  . Codeine   . Corn-Containing Products   . Peanut-Containing Drug Products   . Sesame Oil    Sinus drainage  . Wheat     Sinus drainage   Current Outpatient Prescriptions on File Prior to Visit  Medication Sig Dispense Refill  . ALPRAZolam (XANAX) 0.5 MG tablet Take 1 tablet (0.5 mg total) by mouth 2 (two) times daily.  180 tablet  3  . aspirin 81 MG tablet Take 81 mg by mouth daily.        Marland Kitchen azelastine (ASTELIN) 137 MCG/SPRAY nasal spray 1 spray by Nasal route daily. Use in each nostril as directed       . clobetasol (TEMOVATE) 0.05 % external solution Use as directed  25 mL  3  . cyanocobalamin (,VITAMIN B-12,) 1000 MCG/ML injection Inject 1,000 mcg into the muscle every 30 (thirty) days.        . cyclobenzaprine (FLEXERIL) 10 MG tablet Take 10 mg by mouth 3 (three) times daily as needed.        . enalapril (VASOTEC) 20 MG tablet Take 1 tablet (20 mg total) by mouth daily.  90 tablet  3  . fentaNYL (DURAGESIC) 25 MCG/HR Place 1 patch (25 mcg total) onto the skin every 3 (three) days.  5 patch  0  . fexofenadine (ALLEGRA) 180 MG tablet Take 180  mg by mouth daily.        . fluconazole (DIFLUCAN) 150 MG tablet Take 1 tablet (150 mg total) by mouth once.  1 tablet  0  . fluocinonide (LIDEX) 0.05 % external solution Use as needed      . fluticasone (FLONASE) 50 MCG/ACT nasal spray Place 1 spray into the nose daily.  16 g  3  . folic acid (FOLVITE) 400 MCG tablet Take three tablets by mouth once daily  270 tablet  3  . furosemide (LASIX) 20 MG tablet Take 1 tablet (20 mg total) by mouth 2 (two) times daily.  180 tablet  3  . hydrocortisone (PROCTOSOL HC) 2.5 % rectal cream Place 1 application rectally as needed.  30 g  3  . ibuprofen (ADVIL,MOTRIN) 800 MG tablet Take 800 mg by mouth 3 (three) times daily as needed.        . lubiprostone (AMITIZA) 24 MCG capsule Take 1 capsule (24 mcg total) by mouth 2 (two) times daily with a meal.  180 capsule  3  . meclizine (ANTIVERT) 25 MG tablet Take 25 mg by mouth 3 (three) times daily as needed.        . metFORMIN (GLUCOPHAGE) 500 MG tablet Take 1  tablet (500 mg total) by mouth 2 (two) times daily with a meal.  180 tablet  3  . NON FORMULARY Allergy injections: not given at our office      . omeprazole (PRILOSEC) 20 MG capsule Take 1 capsule (20 mg total) by mouth 2 (two) times daily.  180 capsule  3  . POLYETHYLENE GLYCOL 3350 PO Use one scoop twice a day       . polyethylene glycol powder (GLYCOLAX/MIRALAX) powder Take 17 g by mouth 2 (two) times daily.  255 g  3  . potassium chloride SA (KLOR-CON M20) 20 MEQ tablet Take 1 tablet (20 mEq total) by mouth 2 (two) times daily.  180 tablet  3  . progesterone (PROMETRIUM) 100 MG capsule Take 1 capsule (100 mg total) by mouth at bedtime.  90 capsule  3  . ranitidine (ZANTAC) 300 MG tablet Take 1 tablet (300 mg total) by mouth at bedtime.  90 tablet  3  . simvastatin (ZOCOR) 20 MG tablet Take 1 tablet (20 mg total) by mouth daily.  90 tablet  3  . traMADol (ULTRAM) 50 MG tablet TAKE 1 TABLET BY MOUTH TWICE DAILY AS NEEDED FOR PAIN  60 tablet  0  . verapamil (CALAN) 80 MG tablet Take 1 tablet (80 mg total) by mouth daily.  90 tablet  3  . Vitamin D, Ergocalciferol, (DRISDOL) 50000 UNITS CAPS Take 50,000 Units by mouth daily.        . Vitamin D, Ergocalciferol, (DRISDOL) 50000 UNITS CAPS TAKE 1 CAPSULE BY MOUTH EVERY WEEK  4 capsule  1  . zolpidem (AMBIEN) 5 MG tablet Take 1 tablet (5 mg total) by mouth at bedtime as needed for sleep.  30 tablet  0   The PMH, PSH, Social History, Family History, Medications, and allergies have been reviewed in [facility], and have been updated if relevant.  OBJECTIVE: BP 118/80  Pulse 95  Temp(Src) 99.2 F (37.3 C) (Oral)  Wt 238 lb 8 oz (108.183 kg)  LMP 05/02/2006  She appears well, vital signs are as noted. Right TM retracted.  Throat and pharynx normal.  Neck supple. No adenopathy in the neck. Nose is congested. Sinuses tender throughout, right >left The chest is clear, without  wheezes or rales.  ASSESSMENT:  sinusitis  PLAN: Given duration and  progression of symptoms, will treat for bacterial sinusitis. Symptomatic therapy suggested: push fluids, rest and return office visit prn if symptoms persist or worsen.  Call or return to clinic prn if these symptoms worsen or fail to improve as anticipated.

## 2011-05-20 NOTE — Patient Instructions (Signed)
Good to see you. Take antibiotic as directed.  Drink lots of fluids.  Treat sympotmatically with Mucinex, nasal saline irrigation, and Tylenol/Ibuprofen.Cough suppressant as directed.  Call if not improving as expected in 5-7 days.

## 2011-05-25 ENCOUNTER — Telehealth: Payer: Self-pay | Admitting: *Deleted

## 2011-05-25 NOTE — Telephone Encounter (Signed)
Received a fax from pharmacy requesting additional information for Folic Acid Rx, fax in your IN box.

## 2011-05-26 ENCOUNTER — Other Ambulatory Visit: Payer: Self-pay | Admitting: *Deleted

## 2011-05-26 MED ORDER — FOLIC ACID 800 MCG PO TABS: ORAL_TABLET | ORAL | Status: DC

## 2011-05-26 MED ORDER — ASPIRIN 81 MG PO TABS: 81.0000 mg | ORAL_TABLET | Freq: Every day | ORAL | Status: DC

## 2011-05-26 MED ORDER — TRAMADOL HCL 50 MG PO TABS: 50.0000 mg | ORAL_TABLET | Freq: Two times a day (BID) | ORAL | Status: DC

## 2011-05-26 NOTE — Telephone Encounter (Signed)
Dosage of Folic Acid updated in Epic and Rx was sent electronically, form faxed back to [facility] delivery with the changes.

## 2011-05-26 NOTE — Telephone Encounter (Signed)
Rx for Alprazolam faxed to [facility].

## 2011-05-26 NOTE — Telephone Encounter (Signed)
In my box

## 2011-06-06 ENCOUNTER — Encounter: Payer: Self-pay | Admitting: Family Medicine

## 2011-06-10 ENCOUNTER — Ambulatory Visit (HOSPITAL_BASED_OUTPATIENT_CLINIC_OR_DEPARTMENT_OTHER): Payer: Medicare Other

## 2011-06-10 VITALS — BP 109/71 | HR 110 | Temp 98.3°F

## 2011-06-10 DIAGNOSIS — D649 Anemia, unspecified: Secondary | ICD-10-CM

## 2011-06-10 MED ORDER — CYANOCOBALAMIN 1000 MCG/ML IJ SOLN
1000.0000 ug | Freq: Once | INTRAMUSCULAR | Status: AC
  Administered 2011-06-10: 1000 ug via INTRAMUSCULAR

## 2011-06-28 ENCOUNTER — Other Ambulatory Visit: Payer: Self-pay | Admitting: Gynecology

## 2011-06-28 DIAGNOSIS — M8580 Other specified disorders of bone density and structure, unspecified site: Secondary | ICD-10-CM

## 2011-06-30 ENCOUNTER — Telehealth: Payer: Self-pay | Admitting: *Deleted

## 2011-06-30 NOTE — Telephone Encounter (Signed)
Form for diabetic shoes completed, faxed back to [employer].

## 2011-07-01 DIAGNOSIS — M8580 Other specified disorders of bone density and structure, unspecified site: Secondary | ICD-10-CM

## 2011-07-01 HISTORY — DX: Other specified disorders of bone density and structure, unspecified site: M85.80

## 2011-07-05 ENCOUNTER — Other Ambulatory Visit: Payer: Self-pay | Admitting: Family Medicine

## 2011-07-06 NOTE — Telephone Encounter (Signed)
Refill request for vitamin D, 50000 units.  Is pt still to be taking this?

## 2011-07-07 ENCOUNTER — Ambulatory Visit (INDEPENDENT_AMBULATORY_CARE_PROVIDER_SITE_OTHER): Payer: Medicare Other

## 2011-07-07 DIAGNOSIS — M899 Disorder of bone, unspecified: Secondary | ICD-10-CM

## 2011-07-07 DIAGNOSIS — M8580 Other specified disorders of bone density and structure, unspecified site: Secondary | ICD-10-CM

## 2011-07-08 ENCOUNTER — Encounter: Payer: Self-pay | Admitting: Gynecology

## 2011-07-08 ENCOUNTER — Ambulatory Visit (HOSPITAL_BASED_OUTPATIENT_CLINIC_OR_DEPARTMENT_OTHER): Payer: Medicare Other

## 2011-07-08 VITALS — BP 102/69 | HR 83 | Temp 98.2°F

## 2011-07-08 DIAGNOSIS — D649 Anemia, unspecified: Secondary | ICD-10-CM

## 2011-07-08 DIAGNOSIS — E538 Deficiency of other specified B group vitamins: Secondary | ICD-10-CM

## 2011-07-08 MED ORDER — CYANOCOBALAMIN 1000 MCG/ML IJ SOLN
1000.0000 ug | Freq: Once | INTRAMUSCULAR | Status: AC
  Administered 2011-07-08: 1000 ug via INTRAMUSCULAR

## 2011-07-12 DIAGNOSIS — E7800 Pure hypercholesterolemia, unspecified: Secondary | ICD-10-CM | POA: Insufficient documentation

## 2011-08-05 ENCOUNTER — Ambulatory Visit (HOSPITAL_BASED_OUTPATIENT_CLINIC_OR_DEPARTMENT_OTHER): Payer: Medicare Other

## 2011-08-05 VITALS — BP 117/77 | HR 113 | Temp 98.7°F

## 2011-08-05 DIAGNOSIS — E538 Deficiency of other specified B group vitamins: Secondary | ICD-10-CM

## 2011-08-05 DIAGNOSIS — D649 Anemia, unspecified: Secondary | ICD-10-CM

## 2011-08-05 MED ORDER — CYANOCOBALAMIN 1000 MCG/ML IJ SOLN
1000.0000 ug | Freq: Once | INTRAMUSCULAR | Status: AC
  Administered 2011-08-05: 1000 ug via INTRAMUSCULAR

## 2011-08-16 ENCOUNTER — Other Ambulatory Visit: Payer: Self-pay | Admitting: *Deleted

## 2011-08-16 NOTE — Telephone Encounter (Signed)
Ok to refill all.  Needs CPX for additional refills.

## 2011-08-16 NOTE — Telephone Encounter (Signed)
Patient also requesting proctosol HC cream 2.5% Patient not seen for cpx in 1 year please advise?

## 2011-08-17 ENCOUNTER — Other Ambulatory Visit: Payer: Self-pay | Admitting: Family Medicine

## 2011-08-17 MED ORDER — CLOBETASOL PROPIONATE 0.05 % EX SOLN: CUTANEOUS | Status: DC

## 2011-08-17 MED ORDER — POLYETHYLENE GLYCOL 3350 17 GM/SCOOP PO POWD: 17.0000 g | Freq: Two times a day (BID) | ORAL | Status: DC

## 2011-08-17 MED ORDER — TRAMADOL HCL 50 MG PO TABS: 50.0000 mg | ORAL_TABLET | Freq: Two times a day (BID) | ORAL | Status: DC

## 2011-08-18 ENCOUNTER — Other Ambulatory Visit: Payer: Self-pay | Admitting: *Deleted

## 2011-08-18 MED ORDER — TRAMADOL HCL 50 MG PO TABS: 50.0000 mg | ORAL_TABLET | Freq: Two times a day (BID) | ORAL | Status: DC

## 2011-08-18 MED ORDER — HYDROCORTISONE 2.5 % RE CREA: 1.0000 "application " | TOPICAL_CREAM | RECTAL | Status: DC | PRN

## 2011-08-18 MED ORDER — CLOBETASOL PROPIONATE 0.05 % EX SOLN: CUTANEOUS | Status: DC

## 2011-08-18 MED ORDER — POLYETHYLENE GLYCOL 3350 17 GM/SCOOP PO POWD: 17.0000 g | Freq: Two times a day (BID) | ORAL | Status: AC

## 2011-08-18 NOTE — Telephone Encounter (Signed)
Faxed request from [place].  Other meds were approved on 4/16 for one time with instructions that pt needed to schedule physical, sent to [facility] that day in error, cancelled at [facility], then sent to [facility].

## 2011-08-18 NOTE — Telephone Encounter (Signed)
Addended by: Eliezer Bottom on: 08/18/2011 02:25 PM   Modules accepted: Orders

## 2011-09-02 ENCOUNTER — Encounter: Payer: Self-pay | Admitting: Oncology

## 2011-09-02 ENCOUNTER — Ambulatory Visit (HOSPITAL_BASED_OUTPATIENT_CLINIC_OR_DEPARTMENT_OTHER): Payer: Medicare Other | Admitting: Oncology

## 2011-09-02 ENCOUNTER — Other Ambulatory Visit: Payer: Self-pay | Admitting: *Deleted

## 2011-09-02 ENCOUNTER — Other Ambulatory Visit: Payer: Medicare Other | Admitting: Lab

## 2011-09-02 VITALS — BP 131/84 | HR 109 | Temp 98.7°F | Ht 66.5 in | Wt 242.1 lb

## 2011-09-02 DIAGNOSIS — D649 Anemia, unspecified: Secondary | ICD-10-CM

## 2011-09-02 DIAGNOSIS — R5381 Other malaise: Secondary | ICD-10-CM

## 2011-09-02 DIAGNOSIS — D509 Iron deficiency anemia, unspecified: Secondary | ICD-10-CM

## 2011-09-02 DIAGNOSIS — E538 Deficiency of other specified B group vitamins: Secondary | ICD-10-CM

## 2011-09-02 DIAGNOSIS — D6489 Other specified anemias: Secondary | ICD-10-CM

## 2011-09-02 NOTE — Progress Notes (Signed)
OFFICE PROGRESS NOTE  CC  Kayla Mannan, MD, MD [address] [place] [address], [place] [address]  DIAGNOSIS: 56 year old female with iron deficiency and B12 deficiency associated anemia.  PRIOR THERAPY:  #1 patient has received B12 injections on a monthly basis. Her last B12 level was over 1200.  #2 she recently had iron studies performed and her ferritin is only at 35.  CURRENT THERAPY: Patient will be scheduled to receive Fara heme x1 dose of 510 mg  INTERVAL HISTORY: Kayla Mayo 56 y.o. female returns for Followup visit today. She continues to be very fatigued. She otherwise denies any fevers chills night sweats. She seems to be gaining weight. Her weight is up by 3 pounds since last week. She denies any peripheral paresthesias. No difficulty in ambulation. She has some shortness of breath on exertion but not at rest. She has not noticed swelling of her lower extremities she just recently was diagnosed with bronchitis and she has been taking oral antibiotics and this has improved her cough. Remainder of the 10 point review of systems is negative.  MEDICAL HISTORY: Past Medical History  Diagnosis Date  . Anxiety   . Diabetes mellitus   . GERD (gastroesophageal reflux disease)   . Hyperlipidemia   . Hypertension   . Anal fissure   . Arthritis   . Chronic headaches   . Colon polyps   . Depression   . Gallstones   . IBS (irritable bowel syndrome)   . Obesity   . Status post cardiac catheterization   . Anemia 03/17/2011  . Osteopenia 07/2011    t score -1.5    ALLERGIES:  is allergic to chicken allergy; codeine; corn-containing products; peanut-containing drug products; sesame oil; simvastatin; and wheat.  MEDICATIONS:  Current Outpatient Prescriptions  Medication Sig Dispense Refill  . ALPRAZolam (XANAX) 0.5 MG tablet Take 1 tablet (0.5 mg total) by mouth 2 (two) times daily.  180 tablet  3  . aspirin 81 MG tablet Take 1 tablet (81 mg total) by  mouth daily.  90 tablet  3  . azelastine (ASTELIN) 137 MCG/SPRAY nasal spray 1 spray by Nasal route daily. Use in each nostril as directed       . clobetasol (TEMOVATE) 0.05 % external solution Use as directed  25 mL  0  . cyanocobalamin (,VITAMIN B-12,) 1000 MCG/ML injection Inject 1,000 mcg into the muscle every 30 (thirty) days.        . cyclobenzaprine (FLEXERIL) 10 MG tablet Take 10 mg by mouth 3 (three) times daily as needed.        . enalapril (VASOTEC) 20 MG tablet Take 1 tablet (20 mg total) by mouth daily.  90 tablet  3  . estradiol (VIVELLE-DOT) 0.1 MG/24HR Place 1 patch onto the skin every 3 (three) days.      . fexofenadine (ALLEGRA) 180 MG tablet Take 180 mg by mouth daily.        . fluocinonide (LIDEX) 0.05 % external solution Use as needed      . fluticasone (FLONASE) 50 MCG/ACT nasal spray Place 1 spray into the nose daily.  16 g  3  . folic acid (FOLVITE) 800 MCG tablet Take one and one half tablets by mouth daily  135 tablet  3  . furosemide (LASIX) 20 MG tablet Take 1 tablet (20 mg total) by mouth 2 (two) times daily.  180 tablet  3  . hydrocortisone (PROCTOSOL HC) 2.5 % rectal cream Place 1  application rectally as needed.  30 g  0  . ibuprofen (ADVIL,MOTRIN) 800 MG tablet Take 800 mg by mouth 3 (three) times daily as needed.        . lubiprostone (AMITIZA) 24 MCG capsule Take 1 capsule (24 mcg total) by mouth 2 (two) times daily with a meal.  180 capsule  3  . meclizine (ANTIVERT) 25 MG tablet Take 25 mg by mouth 3 (three) times daily as needed.        . metFORMIN (GLUCOPHAGE) 500 MG tablet Take 1 tablet (500 mg total) by mouth 2 (two) times daily with a meal.  180 tablet  3  . NON FORMULARY Allergy injections: not given at our office      . omeprazole (PRILOSEC) 20 MG capsule Take 1 capsule (20 mg total) by mouth 2 (two) times daily.  180 capsule  3  . POLYETHYLENE GLYCOL 3350 PO Use one scoop twice a day       . polyethylene glycol powder (GLYCOLAX/MIRALAX) powder Take 17 g  by mouth 2 (two) times daily.  255 g  0  . polyethylene glycol powder (GLYCOLAX/MIRALAX) powder Take 17 g by mouth 2 (two) times daily.  255 g  0  . potassium chloride SA (KLOR-CON M20) 20 MEQ tablet Take 1 tablet (20 mEq total) by mouth 2 (two) times daily.  180 tablet  3  . progesterone (PROMETRIUM) 100 MG capsule Take 1 capsule (100 mg total) by mouth at bedtime.  90 capsule  3  . ranitidine (ZANTAC) 300 MG tablet Take 1 tablet (300 mg total) by mouth at bedtime.  90 tablet  3  . rosuvastatin (CRESTOR) 10 MG tablet Take 10 mg by mouth daily.      . traMADol (ULTRAM) 50 MG tablet Take 1 tablet (50 mg total) by mouth 2 (two) times daily.  180 tablet  0  . verapamil (CALAN) 80 MG tablet Take 1 tablet (80 mg total) by mouth daily.  90 tablet  3  . Vitamin D, Ergocalciferol, (DRISDOL) 50000 UNITS CAPS Take 50,000 Units by mouth daily.        . Vitamin D, Ergocalciferol, (DRISDOL) 50000 UNITS CAPS TAKE 1 CAPSULE BY MOUTH EVERY WEEK  4 capsule  1  . zolpidem (AMBIEN) 5 MG tablet Take 1 tablet (5 mg total) by mouth at bedtime as needed for sleep.  30 tablet  0    SURGICAL HISTORY:  Past Surgical History  Procedure Date  . Appendectomy   . Cholecystectomy   . Gastric bypass   . Lower back surgery   . Left shouler surgery   . Tubal ligation   . Bilareral knee scopes     REVIEW OF SYSTEMS:  Pertinent items are noted in HPI.   PHYSICAL EXAMINATION: General appearance: alert, cooperative and appears stated age Resp: clear to auscultation bilaterally and normal percussion bilaterally Back: symmetric, no curvature. ROM normal. No CVA tenderness. Cardio: regular rate and rhythm, S1, S2 normal, no murmur, click, rub or gallop GI: soft, non-tender; bowel sounds normal; no masses,  no organomegaly Extremities: extremities normal, atraumatic, no cyanosis or edema Neurologic: Alert and oriented X 3, normal strength and tone. Normal symmetric reflexes. Normal coordination and gait  ECOG PERFORMANCE  STATUS: 1 - Symptomatic but completely ambulatory  Blood pressure 131/84, pulse 109, temperature 98.7 F (37.1 C), temperature source Oral, height 5' 6.5" (1.689 m), weight 242 lb 1.6 oz (109.816 kg), last menstrual period 05/02/2006.  LABORATORY DATA: Lab Results  Component  Value Date   WBC 4.5 04/15/2011   HGB 12.1 04/15/2011   HCT 36.7 04/15/2011   MCV 93.9 04/15/2011   PLT 274 04/15/2011      Chemistry      Component Value Date/Time   NA 139 04/15/2011 1017   NA 139 04/15/2011 1017   K 4.2 04/15/2011 1017   K 4.2 04/15/2011 1017   CL 104 04/15/2011 1017   CL 104 04/15/2011 1017   CO2 26 04/15/2011 1017   CO2 26 04/15/2011 1017   BUN 11 04/15/2011 1017   BUN 11 04/15/2011 1017   CREATININE 0.80 04/15/2011 1017   CREATININE 0.80 04/15/2011 1017      Component Value Date/Time   CALCIUM 9.2 04/15/2011 1017   CALCIUM 9.2 04/15/2011 1017   ALKPHOS 82 04/15/2011 1017   ALKPHOS 82 04/15/2011 1017   AST 17 04/15/2011 1017   AST 17 04/15/2011 1017   ALT 10 04/15/2011 1017   ALT 10 04/15/2011 1017   BILITOT 0.6 04/15/2011 1017   BILITOT 0.6 04/15/2011 1017       RADIOGRAPHIC STUDIES:  No results found.  ASSESSMENT: 56 year old female with anemia do to B12 and iron deficiency. However her B12 levels look terrific over 1200. 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 Patient does not absorb oral iron and has not been taking oral iron. I therefore am concerned about her iron stores. I have recommended that she proceed with one dose of ferrous heme at 510 mg in the next few weeks to replenish her iron stores.   PLAN:   #1 patient will return on May 24 for 1 dose of IV feraheme  #2 she will be seen back by me in July 2013 at which point we will do iron studies again.   All questions were answered. The patient knows to call the clinic with any problems, questions or concerns. We can certainly see the patient much sooner if necessary.  I spent 25  minutes counseling the patient face to face. The total time spent in the appointment was 30 minutes.    Drue Second, MD Medical/Oncology [facility] [phone] (beeper) [phone] (Office)  09/02/2011, 10:27 AM

## 2011-09-02 NOTE — Patient Instructions (Signed)
1. IV iron infusion to try to bring your iron levels up in your body to help make more red cells on 09/23/11  2. I will see you back in July 2013 with follow up iron studies.

## 2011-09-23 ENCOUNTER — Ambulatory Visit (HOSPITAL_BASED_OUTPATIENT_CLINIC_OR_DEPARTMENT_OTHER): Payer: Medicare Other

## 2011-09-23 VITALS — BP 97/63 | HR 74 | Temp 98.6°F

## 2011-09-23 DIAGNOSIS — D649 Anemia, unspecified: Secondary | ICD-10-CM

## 2011-09-23 MED ORDER — SODIUM CHLORIDE 0.9 % IV SOLN
Freq: Once | INTRAVENOUS | Status: AC
  Administered 2011-09-23: 15:00:00 via INTRAVENOUS

## 2011-09-23 MED ORDER — FERUMOXYTOL INJECTION 510 MG/17 ML
510.0000 mg | Freq: Once | INTRAVENOUS | Status: AC
  Administered 2011-09-23: 510 mg via INTRAVENOUS
  Filled 2011-09-23: qty 17

## 2011-09-23 NOTE — Patient Instructions (Signed)
[facility] Discharge Instructions for Patients Receiving Chemotherapy  Today you received the following: feraheme    If you develop nausea and vomiting that is not controlled by your nausea medication, call the clinic. If it is after clinic hours your family physician or the after hours number for the clinic or go to the Emergency Department.   BELOW ARE SYMPTOMS THAT SHOULD BE REPORTED IMMEDIATELY:  *FEVER GREATER THAN 100.5 F  *CHILLS WITH OR WITHOUT FEVER  NAUSEA AND VOMITING THAT IS NOT CONTROLLED WITH YOUR NAUSEA MEDICATION  *UNUSUAL SHORTNESS OF BREATH  *UNUSUAL BRUISING OR BLEEDING  TENDERNESS IN MOUTH AND THROAT WITH OR WITHOUT PRESENCE OF ULCERS  *URINARY PROBLEMS  *BOWEL PROBLEMS  UNUSUAL RASH Items with * indicate a potential emergency and should be followed up as soon as possible.  One of the nurses will contact you 24 hours after your treatment. Please let the nurse know about any problems that you may have experienced. Feel free to call the clinic you have any questions or concerns. The clinic phone number is [phone].   I have been informed and understand all the instructions given to me. I know to contact the clinic, my physician, or go to the Emergency Department if any problems should occur. I do not have any questions at this time, but understand that I may call the clinic during office hours   should I have any questions or need assistance in obtaining follow up care.    __________________________________________  _____________  __________ Signature of Patient or Authorized Representative            Date                   Time    __________________________________________ Nurse's Signature

## 2011-11-28 ENCOUNTER — Telehealth: Payer: Self-pay | Admitting: *Deleted

## 2011-11-28 ENCOUNTER — Other Ambulatory Visit (HOSPITAL_BASED_OUTPATIENT_CLINIC_OR_DEPARTMENT_OTHER): Payer: Medicare Other | Admitting: Lab

## 2011-11-28 ENCOUNTER — Ambulatory Visit (HOSPITAL_BASED_OUTPATIENT_CLINIC_OR_DEPARTMENT_OTHER): Payer: Medicare Other | Admitting: Oncology

## 2011-11-28 ENCOUNTER — Telehealth: Payer: Self-pay | Admitting: Medical Oncology

## 2011-11-28 ENCOUNTER — Encounter: Payer: Self-pay | Admitting: Oncology

## 2011-11-28 VITALS — BP 136/86 | HR 88 | Temp 98.8°F | Ht 66.5 in | Wt 240.3 lb

## 2011-11-28 DIAGNOSIS — D6489 Other specified anemias: Secondary | ICD-10-CM

## 2011-11-28 DIAGNOSIS — D509 Iron deficiency anemia, unspecified: Secondary | ICD-10-CM

## 2011-11-28 DIAGNOSIS — D649 Anemia, unspecified: Secondary | ICD-10-CM

## 2011-11-28 DIAGNOSIS — D518 Other vitamin B12 deficiency anemias: Secondary | ICD-10-CM

## 2011-11-28 DIAGNOSIS — E538 Deficiency of other specified B group vitamins: Secondary | ICD-10-CM

## 2011-11-28 LAB — CBC WITH DIFFERENTIAL/PLATELET
BASO%: 1.3 % (ref 0.0–2.0)
EOS%: 3.2 % (ref 0.0–7.0)
HCT: 37.1 % (ref 34.8–46.6)
MCH: 31.6 pg (ref 25.1–34.0)
MCHC: 32.6 g/dL (ref 31.5–36.0)
MONO#: 0.3 10*3/uL (ref 0.1–0.9)
NEUT%: 59 % (ref 38.4–76.8)
RBC: 3.82 10*6/uL (ref 3.70–5.45)
WBC: 5.1 10*3/uL (ref 3.9–10.3)
lymph#: 1.5 10*3/uL (ref 0.9–3.3)

## 2011-11-28 LAB — IRON AND TIBC
TIBC: 289 ug/dL (ref 250–470)
UIBC: 210 ug/dL (ref 125–400)

## 2011-11-28 MED ORDER — CYANOCOBALAMIN 1000 MCG/ML IJ SOLN
1000.0000 ug | Freq: Once | INTRAMUSCULAR | Status: AC
  Administered 2011-11-28: 1000 ug via INTRAMUSCULAR

## 2011-11-28 NOTE — Telephone Encounter (Signed)
LMOVM, per MD, iron levels normal, please keep appointments as scheduled.  Instructed patient to call back with confirmation of message and with any questions.

## 2011-11-28 NOTE — Telephone Encounter (Signed)
Message copied by Tylene Fantasia on Mon Nov 28, 2011  3:49 PM ------      Message from: Victorino December      Created: Mon Nov 28, 2011  3:29 PM       Please call patient: iron levels normal, keep appointment as scheduled

## 2011-11-28 NOTE — Patient Instructions (Addendum)
B12 injection today and then every 4 weeks  I will see you back in 6 months

## 2011-11-28 NOTE — Progress Notes (Signed)
OFFICE PROGRESS NOTE  CC  [name], MD [address] [place] [address] [place] [address]  DIAGNOSIS: 56 year old female with iron deficiency and B12 deficiency associated anemia.  PRIOR THERAPY:  #1 patient has received B12 injections on a monthly basis. Her last B12 level was over 1200.  #2 she recently had iron studies performed and her ferritin is only at 35.  CURRENT THERAPY: B12 injections every month INTERVAL HISTORY: Kayla Mayo 56 y.o. female returns for Followup visit today. Overall she is doing well today. She does tell me that after she received the iron infusion she felt much better. Patient does experience significant hot flashes and that does keep her awake at night. She has begun doing acupuncture and she tells me that her hot flashes have decreased in number from 50 to 10 at night. She will continue doing acupuncture to see if she can get complete response and relief to her hot flashes. She denies having any bleeding problems she has no hematuria hematochezia melena hemoptysis or hematemesis. She denies having any purple paresthesias or difficult walking. She has no myalgias or arthralgias. Remainder of the 10 point review of systems is negative. MEDICAL HISTORY: Past Medical History  Diagnosis Date  . Anxiety   . Diabetes mellitus   . GERD (gastroesophageal reflux disease)   . Hyperlipidemia   . Hypertension   . Anal fissure   . Arthritis   . Chronic headaches   . Colon polyps   . Depression   . Gallstones   . IBS (irritable bowel syndrome)   . Obesity   . Status post cardiac catheterization   . Anemia 03/17/2011  . Osteopenia 07/2011    t score -1.5    ALLERGIES:  is allergic to chicken allergy; codeine; corn-containing products; peanut-containing drug products; sesame oil; simvastatin; and wheat.  MEDICATIONS:  Current Outpatient Prescriptions  Medication Sig Dispense Refill  . ALPRAZolam (XANAX) 0.5 MG tablet Take 1 tablet  (0.5 mg total) by mouth 2 (two) times daily.  180 tablet  3  . aspirin-acetaminophen-caffeine (EXCEDRIN MIGRAINE) 250-250-65 MG per tablet Take 1 tablet by mouth every 6 (six) hours as needed.      . clobetasol (TEMOVATE) 0.05 % external solution Use as directed  25 mL  0  . cyclobenzaprine (FLEXERIL) 10 MG tablet Take 10 mg by mouth 3 (three) times daily as needed.        . DULoxetine (CYMBALTA) 20 MG capsule Take 30 mg by mouth daily.      . enalapril (VASOTEC) 20 MG tablet Take 1 tablet (20 mg total) by mouth daily.  90 tablet  3  . fluticasone (FLONASE) 50 MCG/ACT nasal spray Place 1 spray into the nose daily.  16 g  3  . folic acid (FOLVITE) 800 MCG tablet Take one and one half tablets by mouth daily  135 tablet  3  . furosemide (LASIX) 20 MG tablet Take 1 tablet (20 mg total) by mouth 2 (two) times daily.  180 tablet  3  . hydrocortisone (PROCTOSOL HC) 2.5 % rectal cream Place 1 application rectally as needed.  30 g  0  . loratadine (CLARITIN) 10 MG tablet Take 10 mg by mouth daily.      Marland Kitchen lubiprostone (AMITIZA) 24 MCG capsule Take 1 capsule (24 mcg total) by mouth 2 (two) times daily with a meal.  180 capsule  3  . metFORMIN (GLUCOPHAGE) 500 MG tablet Take 1 tablet (500 mg total) by  mouth 2 (two) times daily with a meal.  180 tablet  3  . mirabegron ER (MYRBETRIQ) 25 MG TB24 Take 25 mg by mouth daily.      . nabumetone (RELAFEN) 500 MG tablet Take 500 mg by mouth 2 (two) times daily.      Marland Kitchen omeprazole (PRILOSEC) 20 MG capsule Take 1 capsule (20 mg total) by mouth 2 (two) times daily.  180 capsule  3  . POLYETHYLENE GLYCOL 3350 PO Use one scoop twice a day       . ranitidine (ZANTAC) 300 MG tablet Take 1 tablet (300 mg total) by mouth at bedtime.  90 tablet  3  . rosuvastatin (CRESTOR) 10 MG tablet Take 5 mg by mouth daily.       . verapamil (CALAN) 80 MG tablet Take 1 tablet (80 mg total) by mouth daily.  90 tablet  3  . aspirin 81 MG tablet Take 1 tablet (81 mg total) by mouth daily.  90  tablet  3  . azelastine (ASTELIN) 137 MCG/SPRAY nasal spray 1 spray by Nasal route daily. Use in each nostril as directed       . cyanocobalamin (,VITAMIN B-12,) 1000 MCG/ML injection Inject 1,000 mcg into the muscle every 30 (thirty) days.        Marland Kitchen estradiol (VIVELLE-DOT) 0.1 MG/24HR Place 1 patch onto the skin every 3 (three) days.      . fexofenadine (ALLEGRA) 180 MG tablet Take 180 mg by mouth daily.        . fluocinonide (LIDEX) 0.05 % external solution Use as needed      . ibuprofen (ADVIL,MOTRIN) 800 MG tablet Take 800 mg by mouth 3 (three) times daily as needed.        . meclizine (ANTIVERT) 25 MG tablet Take 25 mg by mouth 3 (three) times daily as needed.        . NON FORMULARY Allergy injections: not given at our office      . polyethylene glycol powder (GLYCOLAX/MIRALAX) powder Take 17 g by mouth 2 (two) times daily.  255 g  0  . polyethylene glycol powder (GLYCOLAX/MIRALAX) powder Take 17 g by mouth 2 (two) times daily.  255 g  0  . potassium chloride SA (KLOR-CON M20) 20 MEQ tablet Take 1 tablet (20 mEq total) by mouth 2 (two) times daily.  180 tablet  3  . progesterone (PROMETRIUM) 100 MG capsule Take 1 capsule (100 mg total) by mouth at bedtime.  90 capsule  3  . traMADol (ULTRAM) 50 MG tablet Take 1 tablet (50 mg total) by mouth 2 (two) times daily.  180 tablet  0  . Vitamin D, Ergocalciferol, (DRISDOL) 50000 UNITS CAPS Take 50,000 Units by mouth daily.        . Vitamin D, Ergocalciferol, (DRISDOL) 50000 UNITS CAPS TAKE 1 CAPSULE BY MOUTH EVERY WEEK  4 capsule  1  . zolpidem (AMBIEN) 5 MG tablet Take 1 tablet (5 mg total) by mouth at bedtime as needed for sleep.  30 tablet  0    SURGICAL HISTORY:  Past Surgical History  Procedure Date  . Appendectomy   . Cholecystectomy   . Gastric bypass   . Lower back surgery   . Left shouler surgery   . Tubal ligation   . Bilareral knee scopes     REVIEW OF SYSTEMS:  Pertinent items are noted in HPI.   PHYSICAL EXAMINATION: General  appearance: alert, cooperative and appears stated age Resp: clear to  auscultation bilaterally and normal percussion bilaterally Back: symmetric, no curvature. ROM normal. No CVA tenderness. Cardio: regular rate and rhythm, S1, S2 normal, no murmur, click, rub or gallop GI: soft, non-tender; bowel sounds normal; no masses,  no organomegaly Extremities: extremities normal, atraumatic, no cyanosis or edema Neurologic: Alert and oriented X 3, normal strength and tone. Normal symmetric reflexes. Normal coordination and gait  ECOG PERFORMANCE STATUS: 1 - Symptomatic but completely ambulatory  Blood pressure 136/86, pulse 88, temperature 98.8 F (37.1 C), temperature source Oral, height 5' 6.5" (1.689 m), weight 240 lb 4.8 oz (108.999 kg), last menstrual period 05/02/2006.  LABORATORY DATA: Lab Results  Component Value Date   WBC 5.1 11/28/2011   HGB 12.1 11/28/2011   HCT 37.1 11/28/2011   MCV 96.9 11/28/2011   PLT 216 11/28/2011      Chemistry      Component Value Date/Time   NA 139 04/15/2011 1017   NA 139 04/15/2011 1017   K 4.2 04/15/2011 1017   K 4.2 04/15/2011 1017   CL 104 04/15/2011 1017   CL 104 04/15/2011 1017   CO2 26 04/15/2011 1017   CO2 26 04/15/2011 1017   BUN 11 04/15/2011 1017   BUN 11 04/15/2011 1017   CREATININE 0.80 04/15/2011 1017   CREATININE 0.80 04/15/2011 1017      Component Value Date/Time   CALCIUM 9.2 04/15/2011 1017   CALCIUM 9.2 04/15/2011 1017   ALKPHOS 82 04/15/2011 1017   ALKPHOS 82 04/15/2011 1017   AST 17 04/15/2011 1017   AST 17 04/15/2011 1017   ALT 10 04/15/2011 1017   ALT 10 04/15/2011 1017   BILITOT 0.6 04/15/2011 1017   BILITOT 0.6 04/15/2011 1017       RADIOGRAPHIC STUDIES:  No results found.  ASSESSMENT: 56 year old female with anemia do to B12 and iron deficiency. She will continue to receive B12 injections on a monthly basis. We will give her parenteral iron as needed for hypoferritnemia and  anemia.   PLAN:   #1 Patient  will continue to receive B12 injections on a monthly basis here at the cancer Center.  #2 we will call her with the results of her iron studies.  #3 I will plan on seeing her back in 6 months time in followup at which time we will do iron studies as well as B12 levels.  All questions were answered. The patient knows to call the clinic with any problems, questions or concerns. We can certainly see the patient much sooner if necessary.  I spent 25 minutes counseling the patient face to face. The total time spent in the appointment was 30 minutes.    Drue Second, MD Medical/Oncology [facility] [phone] (beeper) [phone] (Office)  11/28/2011, 11:35 AM

## 2011-11-28 NOTE — Telephone Encounter (Signed)
Gave patient appointments for 06-29-2012 starting at 10:00am gave patient appointments for 12-26-2011 thru 07-09-2012 b 12 injections appointments every four weeks

## 2011-12-26 ENCOUNTER — Telehealth: Payer: Self-pay | Admitting: Oncology

## 2011-12-26 ENCOUNTER — Ambulatory Visit (HOSPITAL_BASED_OUTPATIENT_CLINIC_OR_DEPARTMENT_OTHER): Payer: Medicare Other

## 2011-12-26 VITALS — BP 150/93 | HR 94 | Temp 99.6°F | Resp 20

## 2011-12-26 DIAGNOSIS — D649 Anemia, unspecified: Secondary | ICD-10-CM

## 2011-12-26 DIAGNOSIS — E538 Deficiency of other specified B group vitamins: Secondary | ICD-10-CM

## 2011-12-26 MED ORDER — CYANOCOBALAMIN 1000 MCG/ML IJ SOLN
1000.0000 ug | Freq: Once | INTRAMUSCULAR | Status: AC
  Administered 2011-12-26: 1000 ug via INTRAMUSCULAR

## 2011-12-26 NOTE — Telephone Encounter (Signed)
Pt came in today to get schedule for September 2013 thru March 2014. mdale.

## 2011-12-30 DIAGNOSIS — J309 Allergic rhinitis, unspecified: Secondary | ICD-10-CM | POA: Insufficient documentation

## 2011-12-30 DIAGNOSIS — Z9109 Other allergy status, other than to drugs and biological substances: Secondary | ICD-10-CM | POA: Insufficient documentation

## 2012-01-23 ENCOUNTER — Ambulatory Visit (HOSPITAL_BASED_OUTPATIENT_CLINIC_OR_DEPARTMENT_OTHER): Payer: Medicare Other

## 2012-01-23 VITALS — BP 120/81 | HR 73 | Temp 98.6°F

## 2012-01-23 DIAGNOSIS — D649 Anemia, unspecified: Secondary | ICD-10-CM

## 2012-01-23 DIAGNOSIS — E538 Deficiency of other specified B group vitamins: Secondary | ICD-10-CM

## 2012-01-23 MED ORDER — CYANOCOBALAMIN 1000 MCG/ML IJ SOLN
1000.0000 ug | Freq: Once | INTRAMUSCULAR | Status: AC
  Administered 2012-01-23: 1000 ug via INTRAMUSCULAR

## 2012-01-25 ENCOUNTER — Other Ambulatory Visit: Payer: Self-pay | Admitting: Family Medicine

## 2012-01-25 DIAGNOSIS — Z1231 Encounter for screening mammogram for malignant neoplasm of breast: Secondary | ICD-10-CM

## 2012-02-01 ENCOUNTER — Ambulatory Visit
Admission: RE | Admit: 2012-02-01 | Discharge: 2012-02-01 | Disposition: A | Discharge status: Home / Self Care | Payer: Medicare Other | Source: Ambulatory Visit | Attending: Family Medicine | Admitting: Family Medicine

## 2012-02-01 ENCOUNTER — Other Ambulatory Visit: Payer: Self-pay | Admitting: Family Medicine

## 2012-02-01 DIAGNOSIS — Z1231 Encounter for screening mammogram for malignant neoplasm of breast: Secondary | ICD-10-CM

## 2012-02-20 ENCOUNTER — Ambulatory Visit (HOSPITAL_BASED_OUTPATIENT_CLINIC_OR_DEPARTMENT_OTHER): Payer: Medicare Other

## 2012-02-20 VITALS — BP 115/76 | HR 71 | Temp 98.6°F

## 2012-02-20 DIAGNOSIS — D649 Anemia, unspecified: Secondary | ICD-10-CM

## 2012-02-20 DIAGNOSIS — E538 Deficiency of other specified B group vitamins: Secondary | ICD-10-CM

## 2012-02-20 MED ORDER — CYANOCOBALAMIN 1000 MCG/ML IJ SOLN
1000.0000 ug | Freq: Once | INTRAMUSCULAR | Status: AC
Start: 2012-02-20 — End: 2012-02-20
  Administered 2012-02-20: 1000 ug via INTRAMUSCULAR

## 2012-03-19 ENCOUNTER — Ambulatory Visit (HOSPITAL_BASED_OUTPATIENT_CLINIC_OR_DEPARTMENT_OTHER): Payer: Medicare Other

## 2012-03-19 VITALS — BP 102/70 | HR 92 | Temp 98.5°F

## 2012-03-19 DIAGNOSIS — E538 Deficiency of other specified B group vitamins: Secondary | ICD-10-CM

## 2012-03-19 DIAGNOSIS — D649 Anemia, unspecified: Secondary | ICD-10-CM

## 2012-03-19 MED ORDER — CYANOCOBALAMIN 1000 MCG/ML IJ SOLN
1000.0000 ug | Freq: Once | INTRAMUSCULAR | Status: AC
  Administered 2012-03-19: 1000 ug via INTRAMUSCULAR

## 2012-04-16 ENCOUNTER — Ambulatory Visit (HOSPITAL_BASED_OUTPATIENT_CLINIC_OR_DEPARTMENT_OTHER): Payer: Medicare Other

## 2012-04-16 VITALS — BP 126/87 | HR 80 | Temp 98.6°F

## 2012-04-16 DIAGNOSIS — D649 Anemia, unspecified: Secondary | ICD-10-CM

## 2012-04-16 DIAGNOSIS — E538 Deficiency of other specified B group vitamins: Secondary | ICD-10-CM

## 2012-04-16 MED ORDER — CYANOCOBALAMIN 1000 MCG/ML IJ SOLN
1000.0000 ug | Freq: Once | INTRAMUSCULAR | Status: AC
  Administered 2012-04-16: 1000 ug via INTRAMUSCULAR

## 2012-05-14 ENCOUNTER — Ambulatory Visit: Payer: Medicare Other

## 2012-05-14 ENCOUNTER — Ambulatory Visit (HOSPITAL_BASED_OUTPATIENT_CLINIC_OR_DEPARTMENT_OTHER): Payer: Medicare Other

## 2012-05-14 VITALS — BP 140/87 | HR 82 | Temp 98.3°F

## 2012-05-14 DIAGNOSIS — E538 Deficiency of other specified B group vitamins: Secondary | ICD-10-CM

## 2012-05-14 DIAGNOSIS — D649 Anemia, unspecified: Secondary | ICD-10-CM

## 2012-05-14 MED ORDER — CYANOCOBALAMIN 1000 MCG/ML IJ SOLN
1000.0000 ug | Freq: Once | INTRAMUSCULAR | Status: AC
  Administered 2012-05-14: 1000 ug via INTRAMUSCULAR

## 2012-05-17 ENCOUNTER — Encounter: Payer: Self-pay | Admitting: Gynecology

## 2012-05-17 ENCOUNTER — Ambulatory Visit (INDEPENDENT_AMBULATORY_CARE_PROVIDER_SITE_OTHER): Payer: Medicare Other | Admitting: Gynecology

## 2012-05-17 VITALS — BP 130/80 | Ht 66.0 in | Wt 229.0 lb

## 2012-05-17 DIAGNOSIS — M899 Disorder of bone, unspecified: Secondary | ICD-10-CM

## 2012-05-17 DIAGNOSIS — N951 Menopausal and female climacteric states: Secondary | ICD-10-CM

## 2012-05-17 DIAGNOSIS — N952 Postmenopausal atrophic vaginitis: Secondary | ICD-10-CM

## 2012-05-17 DIAGNOSIS — M949 Disorder of cartilage, unspecified: Secondary | ICD-10-CM

## 2012-05-17 DIAGNOSIS — M8580 Other specified disorders of bone density and structure, unspecified site: Secondary | ICD-10-CM

## 2012-05-17 DIAGNOSIS — N3941 Urge incontinence: Secondary | ICD-10-CM

## 2012-05-17 NOTE — Patient Instructions (Signed)
Follow up in one year for annual exam 

## 2012-05-17 NOTE — Progress Notes (Signed)
Kayla Mayo [facility] 02-08-1956 [id number]        57 y.o.  G2P2 for annual follow up exam.  Several issues noted below.  Past medical history,surgical history, medications, allergies, family history and social history were all reviewed and documented in the EPIC chart. ROS:  Was performed and pertinent positives and negatives are included in the history.  Exam: [name] assistant Filed Vitals:   05/17/12 0846  BP: 130/80  Height: 5\' 6"  (1.676 m)  Weight: 229 lb (103.874 kg)   General appearance  Normal Skin grossly normal Head/Neck normal with no cervical or supraclavicular adenopathy thyroid normal Lungs  clear Cardiac RR, without RMG Abdominal  soft, nontender, without masses, organomegaly or hernia Breasts  examined lying and sitting without masses, retractions, discharge or axillary adenopathy. Pelvic  Ext/BUS/vagina  normal with atrophic changes  Cervix  normal with atrophic changes  Uterus  anteverted, normal size, shape and contour, midline and mobile nontender   Adnexa  Without masses or tenderness    Anus and perineum  normal   Rectovaginal  normal sphincter tone without palpated masses or tenderness.    Assessment/Plan:  57 y.o. G2P2 female for annual follow up exam.   1. Postmenopausal. Mild hot flushes sweats. She tried acupuncture and did well with this. Has weaned off of her HRT and is tolerating it and we'll continue to monitor. 2. Osteopenia.  DEXA 07/2011 with T score -1.5 FRAX 2%/0%. Recommend repeat in 5 years. Increase calcium vitamin D reviewed. 3. Urge incontinence. Saw Dr. Jacquelyne Balint started on Myrbetrique and is doing well and is continuing with this. 4. Pap smear 2013. No Pap smear done today. No history of abnormal Pap smears. Reviewed current screening guidelines and plan repeat at 3 year interval. 5. Mammography 01/2012. Plan repeat annually. SBE monthly reviewed. 6. Colonoscopy 2009. Follow up for repeat at their recommended interval. 7. Health maintenance. No  lab work done as it is all done through her primary physician's office who she sees on a regular basis. Follow up one year, sooner as needed.   [name] MD, 9:20 AM 05/17/2012

## 2012-06-11 ENCOUNTER — Ambulatory Visit (HOSPITAL_BASED_OUTPATIENT_CLINIC_OR_DEPARTMENT_OTHER): Payer: Medicare Other

## 2012-06-11 VITALS — BP 111/76 | HR 93 | Temp 99.2°F

## 2012-06-11 DIAGNOSIS — D649 Anemia, unspecified: Secondary | ICD-10-CM

## 2012-06-11 DIAGNOSIS — E538 Deficiency of other specified B group vitamins: Secondary | ICD-10-CM

## 2012-06-11 MED ORDER — CYANOCOBALAMIN 1000 MCG/ML IJ SOLN
1000.0000 ug | Freq: Once | INTRAMUSCULAR | Status: AC
  Administered 2012-06-11: 1000 ug via INTRAMUSCULAR

## 2012-06-29 ENCOUNTER — Encounter: Payer: Self-pay | Admitting: Oncology

## 2012-06-29 ENCOUNTER — Telehealth: Payer: Self-pay | Admitting: Oncology

## 2012-06-29 ENCOUNTER — Other Ambulatory Visit (HOSPITAL_BASED_OUTPATIENT_CLINIC_OR_DEPARTMENT_OTHER): Payer: Medicare Other | Admitting: Lab

## 2012-06-29 ENCOUNTER — Ambulatory Visit (HOSPITAL_BASED_OUTPATIENT_CLINIC_OR_DEPARTMENT_OTHER): Payer: Medicare Other | Admitting: Oncology

## 2012-06-29 VITALS — BP 129/80 | HR 91 | Temp 98.3°F | Resp 20 | Ht 66.0 in | Wt 231.1 lb

## 2012-06-29 DIAGNOSIS — D518 Other vitamin B12 deficiency anemias: Secondary | ICD-10-CM

## 2012-06-29 DIAGNOSIS — D509 Iron deficiency anemia, unspecified: Secondary | ICD-10-CM

## 2012-06-29 DIAGNOSIS — D649 Anemia, unspecified: Secondary | ICD-10-CM

## 2012-06-29 DIAGNOSIS — D6489 Other specified anemias: Secondary | ICD-10-CM

## 2012-06-29 DIAGNOSIS — E538 Deficiency of other specified B group vitamins: Secondary | ICD-10-CM

## 2012-06-29 LAB — CBC WITH DIFFERENTIAL/PLATELET
BASO%: 0.8 % (ref 0.0–2.0)
EOS%: 1.1 % (ref 0.0–7.0)
MCH: 30.6 pg (ref 25.1–34.0)
MCHC: 32.9 g/dL (ref 31.5–36.0)
MCV: 93.2 fL (ref 79.5–101.0)
MONO%: 5.2 % (ref 0.0–14.0)
NEUT%: 57 % (ref 38.4–76.8)
RDW: 14 % (ref 11.2–14.5)
lymph#: 2 10*3/uL (ref 0.9–3.3)

## 2012-06-29 LAB — IRON AND TIBC
%SAT: 33 % (ref 20–55)
Iron: 126 ug/dL (ref 42–145)

## 2012-06-29 LAB — FERRITIN: Ferritin: 39 ng/mL (ref 10–291)

## 2012-06-29 NOTE — Telephone Encounter (Signed)
gv pt appt schedule for March 2014 thru February 2015.

## 2012-06-29 NOTE — Progress Notes (Signed)
OFFICE PROGRESS NOTE  CC  Harvie Heck, MD [address] [place] [address]  DIAGNOSIS: 57 year old female with iron deficiency and B12 deficiency associated anemia.  PRIOR THERAPY:  #1 patient has received B12 injections on a monthly basis. Her last B12 level was over 1200.  #2 she recently had iron studies performed and her ferritin is only at 35.  CURRENT THERAPY: B12 injections every month INTERVAL HISTORY: Kayla Mayo 57 y.o. female returns for Followup visit today. She denies having any bleeding problems she has no hematuria hematochezia melena hemoptysis or hematemesis. She denies having any purple paresthesias or difficult walking. She has no myalgias or arthralgias. Remainder of the 10 point review of systems is negative. MEDICAL HISTORY: Past Medical History  Diagnosis Date  . Anxiety   . Diabetes mellitus   . GERD (gastroesophageal reflux disease)   . Hyperlipidemia   . Hypertension   . Anal fissure   . Arthritis   . Chronic headaches   . Colon polyps   . Depression   . Gallstones   . IBS (irritable bowel syndrome)   . Obesity   . Status post cardiac catheterization   . Anemia 03/17/2011  . Osteopenia 07/2011    t score -1.5 FRAX 2.1%/0%    ALLERGIES:  is allergic to chicken allergy; codeine; corn-containing products; other; peanut-containing drug products; sesame oil; simvastatin; and wheat.  MEDICATIONS:  Current Outpatient Prescriptions  Medication Sig Dispense Refill  . ALPRAZolam (XANAX) 0.5 MG tablet Take 1 tablet (0.5 mg total) by mouth 2 (two) times daily.  180 tablet  3  . aspirin-acetaminophen-caffeine (EXCEDRIN MIGRAINE) 250-250-65 MG per tablet Take 1 tablet by mouth every 6 (six) hours as needed.      . Azelastine-Fluticasone (DYMISTA NA) Place into the nose.      . Beclomethasone Dipropionate (QVAR IN) Inhale into the lungs.      . clobetasol (TEMOVATE) 0.05 % external solution Use as directed  25 mL  0  . cyanocobalamin (,VITAMIN  B-12,) 1000 MCG/ML injection Inject 1,000 mcg into the muscle every 30 (thirty) days.        . cyclobenzaprine (FLEXERIL) 10 MG tablet Take 10 mg by mouth 3 (three) times daily as needed.        . enalapril (VASOTEC) 20 MG tablet Take 1 tablet (20 mg total) by mouth daily.  90 tablet  3  . fexofenadine (ALLEGRA) 180 MG tablet Take 180 mg by mouth daily.        . folic acid (FOLVITE) 800 MCG tablet Take one and one half tablets by mouth daily  135 tablet  3  . furosemide (LASIX) 20 MG tablet Take 1 tablet (20 mg total) by mouth 2 (two) times daily.  180 tablet  3  . hydrocortisone (PROCTOSOL HC) 2.5 % rectal cream Place 1 application rectally as needed.  30 g  0  . Liraglutide (VICTOZA [place]) Inject into the skin.      Marland Kitchen loratadine (CLARITIN) 10 MG tablet Take 10 mg by mouth daily.      Marland Kitchen lubiprostone (AMITIZA) 24 MCG capsule Take 1 capsule (24 mcg total) by mouth 2 (two) times daily with a meal.  180 capsule  3  . meclizine (ANTIVERT) 25 MG tablet Take 25 mg by mouth 3 (three) times daily as needed.        . metFORMIN (GLUCOPHAGE) 500 MG tablet Take 1 tablet (500 mg total) by mouth 2 (two) times daily with a meal.  180 tablet  3  . mirabegron ER (MYRBETRIQ) 25 MG TB24 Take 25 mg by mouth daily.      . NON FORMULARY Allergy injections: not given at our office      . omeprazole (PRILOSEC) 20 MG capsule Take 1 capsule (20 mg total) by mouth 2 (two) times daily.  180 capsule  3  . PATADAY 0.2 % SOLN       . polyethylene glycol powder (GLYCOLAX/MIRALAX) powder Take 17 g by mouth 2 (two) times daily.  255 g  0  . rosuvastatin (CRESTOR) 10 MG tablet Take 5 mg by mouth daily.       . verapamil (CALAN) 80 MG tablet Take 1 tablet (80 mg total) by mouth daily.  90 tablet  3  . ibuprofen (ADVIL,MOTRIN) 800 MG tablet Take 800 mg by mouth 3 (three) times daily as needed.        . montelukast (SINGULAIR) 10 MG tablet       . nabumetone (RELAFEN) 500 MG tablet Take 500 mg by mouth 2 (two) times daily.      .  potassium chloride SA (KLOR-CON M20) 20 MEQ tablet Take 1 tablet (20 mEq total) by mouth 2 (two) times daily.  180 tablet  3   No current facility-administered medications for this visit.    SURGICAL HISTORY:  Past Surgical History  Procedure Laterality Date  . Appendectomy    . Cholecystectomy    . Gastric bypass    . Lower back surgery    . Left shouler surgery    . Tubal ligation    . Bilareral knee scopes    . Carpal tunnel release      REVIEW OF SYSTEMS:  Pertinent items are noted in HPI.   PHYSICAL EXAMINATION: General appearance: alert, cooperative and appears stated age Resp: clear to auscultation bilaterally and normal percussion bilaterally Back: symmetric, no curvature. ROM normal. No CVA tenderness. Cardio: regular rate and rhythm, S1, S2 normal, no murmur, click, rub or gallop GI: soft, non-tender; bowel sounds normal; no masses,  no organomegaly Extremities: extremities normal, atraumatic, no cyanosis or edema Neurologic: Alert and oriented X 3, normal strength and tone. Normal symmetric reflexes. Normal coordination and gait  ECOG PERFORMANCE STATUS: 1 - Symptomatic but completely ambulatory  Blood pressure 129/80, pulse 91, temperature 98.3 F (36.8 C), temperature source Oral, resp. rate 20, height 5\' 6"  (1.676 m), weight 231 lb 1.6 oz (104.826 kg), last menstrual period 05/02/2006.  LABORATORY DATA: Lab Results  Component Value Date   WBC 5.6 06/29/2012   HGB 12.2 06/29/2012   HCT 37.2 06/29/2012   MCV 93.2 06/29/2012   PLT 236 06/29/2012      Chemistry      Component Value Date/Time   NA 139 04/15/2011 1017   K 4.2 04/15/2011 1017   CL 104 04/15/2011 1017   CO2 26 04/15/2011 1017   BUN 11 04/15/2011 1017   CREATININE 0.80 04/15/2011 1017      Component Value Date/Time   CALCIUM 9.2 04/15/2011 1017   ALKPHOS 82 04/15/2011 1017   AST 17 04/15/2011 1017   ALT 10 04/15/2011 1017   BILITOT 0.6 04/15/2011 1017       RADIOGRAPHIC STUDIES:  No  results found.  ASSESSMENT: 57 year old female with anemia do to B12 and iron deficiency. She will continue to receive B12 injections on a monthly basis. We will give her parenteral iron as needed for hypoferritnemia and  anemia.   PLAN:   #1 Patient will  continue to receive B12 injections on a monthly basis here at the [facility].  #2 we will call her with the results of her iron studies.  #3 I will plan on seeing her back in 12 months time in followup at which time we will do iron studies as well as B12 levels.  All questions were answered. The patient knows to call the clinic with any problems, questions or concerns. We can certainly see the patient much sooner if necessary.  I spent 25 minutes counseling the patient face to face. The total time spent in the appointment was 30 minutes.    Drue Second, MD Medical/Oncology [facility] [phone] (beeper) [phone] (Office)  06/29/2012, 11:07 AM

## 2012-06-29 NOTE — Patient Instructions (Addendum)
Proceed with B12 injections monthly  I will see you back in 1 year

## 2012-07-09 ENCOUNTER — Ambulatory Visit (HOSPITAL_BASED_OUTPATIENT_CLINIC_OR_DEPARTMENT_OTHER): Payer: Medicare Other

## 2012-07-09 VITALS — BP 114/63 | HR 93 | Temp 98.4°F

## 2012-07-09 DIAGNOSIS — D649 Anemia, unspecified: Secondary | ICD-10-CM

## 2012-07-09 DIAGNOSIS — E538 Deficiency of other specified B group vitamins: Secondary | ICD-10-CM

## 2012-07-09 MED ORDER — CYANOCOBALAMIN 1000 MCG/ML IJ SOLN
1000.0000 ug | INTRAMUSCULAR | Status: DC
  Administered 2012-07-09: 1000 ug via INTRAMUSCULAR

## 2012-07-09 NOTE — Patient Instructions (Signed)
Call MD for any questions related to your labs.

## 2012-07-09 NOTE — Progress Notes (Signed)
Printed lab results for patient from last draw as requested.

## 2012-08-06 ENCOUNTER — Ambulatory Visit (HOSPITAL_BASED_OUTPATIENT_CLINIC_OR_DEPARTMENT_OTHER): Payer: Medicare Other

## 2012-08-06 VITALS — BP 118/34 | HR 93 | Temp 98.8°F

## 2012-08-06 DIAGNOSIS — E538 Deficiency of other specified B group vitamins: Secondary | ICD-10-CM

## 2012-08-06 DIAGNOSIS — D649 Anemia, unspecified: Secondary | ICD-10-CM

## 2012-08-06 MED ORDER — CYANOCOBALAMIN 1000 MCG/ML IJ SOLN
1000.0000 ug | INTRAMUSCULAR | Status: DC
Start: 2012-08-06 — End: 2012-08-06
  Administered 2012-08-06: 1000 ug via INTRAMUSCULAR

## 2012-08-06 NOTE — Patient Instructions (Addendum)

## 2012-08-20 ENCOUNTER — Ambulatory Visit (INDEPENDENT_AMBULATORY_CARE_PROVIDER_SITE_OTHER): Payer: Medicare Other | Admitting: Psychiatry

## 2012-08-20 ENCOUNTER — Encounter (HOSPITAL_COMMUNITY): Payer: Self-pay

## 2012-08-20 ENCOUNTER — Encounter (HOSPITAL_COMMUNITY): Payer: Self-pay | Admitting: Psychiatry

## 2012-08-20 VITALS — BP 120/80 | HR 95 | Ht 66.0 in | Wt 233.0 lb

## 2012-08-20 DIAGNOSIS — F331 Major depressive disorder, recurrent, moderate: Secondary | ICD-10-CM

## 2012-08-20 DIAGNOSIS — F411 Generalized anxiety disorder: Secondary | ICD-10-CM

## 2012-08-20 MED ORDER — VENLAFAXINE HCL ER 37.5 MG PO CP24: 37.5000 mg | ORAL_CAPSULE | Freq: Every day | ORAL | Status: DC
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
## 2012-08-22 DIAGNOSIS — F411 Generalized anxiety disorder: Secondary | ICD-10-CM | POA: Insufficient documentation

## 2012-09-03 ENCOUNTER — Ambulatory Visit (HOSPITAL_BASED_OUTPATIENT_CLINIC_OR_DEPARTMENT_OTHER): Payer: Medicare Other

## 2012-09-03 VITALS — BP 125/70 | HR 82 | Temp 98.5°F

## 2012-09-03 DIAGNOSIS — E538 Deficiency of other specified B group vitamins: Secondary | ICD-10-CM

## 2012-09-03 DIAGNOSIS — D649 Anemia, unspecified: Secondary | ICD-10-CM

## 2012-09-03 MED ORDER — CYANOCOBALAMIN 1000 MCG/ML IJ SOLN
1000.0000 ug | INTRAMUSCULAR | Status: DC
  Administered 2012-09-03: 1000 ug via INTRAMUSCULAR

## 2012-09-04 ENCOUNTER — Encounter (HOSPITAL_COMMUNITY): Payer: Self-pay | Admitting: Psychiatry

## 2012-09-04 ENCOUNTER — Ambulatory Visit (INDEPENDENT_AMBULATORY_CARE_PROVIDER_SITE_OTHER): Payer: Medicare Other | Admitting: Psychiatry

## 2012-09-04 VITALS — BP 125/77 | HR 76 | Ht 66.0 in | Wt 228.0 lb

## 2012-09-04 DIAGNOSIS — F331 Major depressive disorder, recurrent, moderate: Secondary | ICD-10-CM

## 2012-09-04 DIAGNOSIS — F411 Generalized anxiety disorder: Secondary | ICD-10-CM

## 2012-09-04 DIAGNOSIS — F339 Major depressive disorder, recurrent, unspecified: Secondary | ICD-10-CM

## 2012-09-04 MED ORDER — VENLAFAXINE HCL ER 75 MG PO CP24: 150.0000 mg | ORAL_CAPSULE | Freq: Every day | ORAL | Status: DC

## 2012-09-04 NOTE — Progress Notes (Signed)
[facility] [address] Progress Note   Patient Identification:  Kayla Mayo  Date of Evaluation:  09/04/2012  Chief Complaint:    Chief Complaint  Patient presents with  . Follow-up   History of Chief Complaint:  HPI Comments: Chief Complaint:  SUBJECTIVE: [name] is a 57 year old female with a diagnosis of Generalized Anxiety Disorder and Major Depressive Disorder, recurrent, moderate. The patient says she feels "kinda of in between". Ms. The patient states he is taking all his medications and denies any side effects from his medications. He denies any other complaints.   In the area of affective symptoms, patient appears . Patient denies current suicidal ideation, intent, or plan. Patient denies current homicidal ideation, intent, or plan. Patient denies auditory hallucinations. Patient denies visual hallucinations. Patient denies symptoms of paranoia. Patient states sleep is good, with approximately 4 hours of sleep per night. Appetite is fair. Energy level is fair. Patient endorses a decrease in  symptoms of anhedonia. Patient denies hopelessness, helplessness, or guilt.   Denies any recent episodes consistent with mania, particularly decreased need for sleep with increased energy, grandiosity, impulsivity, hyperverbal and pressured speech, or increased productivity. Denies any recent symptoms consistent with psychosis, particularly auditory or visual hallucinations, thought broadcasting/insertion/withdrawal, or ideas of reference. Also denies excessive worry to the point of physical symptoms as well as any panic attacks. Denies any history of trauma or symptoms consistent with PTSD such as flashbacks, nightmares, hypervigilance, feelings of numbness or inability to connect with others.   Review of Systems  Constitutional: Negative for fever, chills, activity change, appetite change and fatigue.  Respiratory: Negative for apnea, cough and wheezing.   Cardiovascular: Negative  for leg swelling.  Gastrointestinal: Positive for constipation and abdominal distention. Negative for vomiting, abdominal pain, diarrhea, blood in stool and anal bleeding.  Musculoskeletal:       Gait abnormalities due to knee pain. Muscle strength normal.  Neurological: Negative for tremors, seizures, syncope and numbness (In right hand.).   Psychiatric: Agitation: Negative Hallucination: Negative Depressed Mood: Negative Insomnia: Negative Hypersomnia: Yes Altered Concentration: No Feels Worthless: No Grandiose Ideas: Negative Belief In Special Powers: Negative New/Increased Substance Abuse: Negative Compulsions: Negative  Traumatic Brain Injury: Negative  Filed Vitals:   09/04/12 1526  BP: 125/77  Pulse: 76  Height: 5\' 6"  (1.676 m)  Weight: 228 lb (103.42 kg)   Physical Exam  Vitals reviewed. Constitutional: She appears well-developed and well-nourished. No distress.  Skin: She is not diaphoretic.   General Appearance: alert, oriented, no acute distress, obese and cachectic  Musculoskeletal: Strength & Muscle Tone: within normal limits Gait & Station: normal Patient leans: N/A  Psychiatric Specialty Examination: Objective:  Appearance: Casual and Fairly Groomed  Patent attorney::  Fair  Speech:  Clear and Coherent and Normal Rate  Volume:  Normal  Mood:  " better"  5/10  (0=Very depressed; 5=Neutral; 10=Very Happy)   Affect:  Appropriate, Congruent and Full Range  Thought Process:  Coherent, Linear and Logical  Orientation:  Full (Time, Place, and Person)  Thought Content:  WDL  Suicidal Thoughts:  No  Homicidal Thoughts:  No  Judgement:  Fair  Insight:  Fair  Psychomotor Activity:  Normal  Akathisia:  No  Handed:  Right  Memory: 3/3 immediate; 2/3 recent  AIMS (if indicated):  Not indicate  Assets:  Communication Skills Desire for Improvement Financial Resources/Insurance Housing Intimacy Leisure Time Physical Health Resilience Social  Support Talents/Skills Transportation    Laboratory/X-Ray Psychological Evaluation(s)  None  None   Past Psychiatric History: Reviewed Diagnosis: Adjustment Disorder with mixed emotions  Hospitalizations: Patient denies.  Outpatient Care: Yes-first in 1991 (first husband left her), and in 2010 (due to marital stressors)  Substance Abuse Care: Patient denies.  Self-Mutilation: Patient denies.  Suicidal Attempts: Patient denies.  Violent Behaviors: none reported  History of Electroconvulsive Shock Therapy: Negative    Past Medical History:  Reviewed Past Medical History  Diagnosis Date  . Anxiety   . Diabetes mellitus   . GERD (gastroesophageal reflux disease)   . Hyperlipidemia   . Hypertension   . Anal fissure   . Arthritis   . Chronic headaches   . Colon polyps   . Depression   . Gallstones   . IBS (irritable bowel syndrome)   . Obesity   . Status post cardiac catheterization   . Anemia 03/17/2011  . Osteopenia 07/2011    t score -1.5 FRAX 2.1%/0%  . Vertigo     Occurs 6 times a month   History of Loss of Consciousness:  No Seizure History:  Negative Cardiac History:  Yes Hyperlidiemia and Hypertension Allergies:  Reviewed Allergies  Allergen Reactions  . Chicken Allergy     Sinus drainage  . Codeine   . Corn-Containing Products   . Other     Statins  . Peanut-Containing Drug Products   . Sesame Oil     Sinus drainage  . Simvastatin     Muscle pain  . Wheat     Sinus drainage   Current Medications: Reviewed Current Outpatient Prescriptions  Medication Sig Dispense Refill  . ALPRAZolam (XANAX) 0.5 MG tablet Take 1 tablet (0.5 mg total) by mouth 2 (two) times daily.  180 tablet  3  . aspirin-acetaminophen-caffeine (EXCEDRIN MIGRAINE) 250-250-65 MG per tablet Take 1 tablet by mouth every 6 (six) hours as needed.      . Azelastine-Fluticasone (DYMISTA NA) Place into the nose.      . Beclomethasone Dipropionate (QVAR IN) Inhale into the lungs.      .  clobetasol (TEMOVATE) 0.05 % external solution Use as directed  25 mL  0  . cyanocobalamin (,VITAMIN B-12,) 1000 MCG/ML injection Inject 1,000 mcg into the muscle every 30 (thirty) days.        . cyclobenzaprine (FLEXERIL) 10 MG tablet Take 10 mg by mouth 3 (three) times daily as needed.        . enalapril (VASOTEC) 20 MG tablet Take 1 tablet (20 mg total) by mouth daily.  90 tablet  3  . EPIPEN 2-PAK 0.3 MG/0.3ML DEVI as needed.      . fexofenadine (ALLEGRA) 180 MG tablet Take 180 mg by mouth daily.        . Fluocinolone Acetonide 0.01 % OIL Place 1 drop into both ears.      . folic acid (FOLVITE) 800 MCG tablet Take one and one half tablets by mouth daily  135 tablet  3  . furosemide (LASIX) 20 MG tablet Take 1 tablet (20 mg total) by mouth 2 (two) times daily.  180 tablet  3  . hydrocortisone (PROCTOSOL HC) 2.5 % rectal cream Place 1 application rectally as needed.  30 g  0  . Liraglutide (VICTOZA [place]) Inject into the skin.      Marland Kitchen loratadine (CLARITIN) 10 MG tablet Take 10 mg by mouth daily.      Marland Kitchen lubiprostone (AMITIZA) 24 MCG capsule Take 1 capsule (24 mcg total) by mouth 2 (two) times daily  with a meal.  180 capsule  3  . meclizine (ANTIVERT) 25 MG tablet Take 25 mg by mouth 3 (three) times daily as needed.        . metFORMIN (GLUCOPHAGE) 500 MG tablet Take 1 tablet (500 mg total) by mouth 2 (two) times daily with a meal.  180 tablet  3  . mirabegron ER (MYRBETRIQ) 25 MG TB24 Take 25 mg by mouth daily.      . montelukast (SINGULAIR) 10 MG tablet       . nabumetone (RELAFEN) 500 MG tablet Take 500 mg by mouth 2 (two) times daily.      . NON FORMULARY Allergy injections: not given at our office      . omeprazole (PRILOSEC) 20 MG capsule Take 1 capsule (20 mg total) by mouth 2 (two) times daily.  180 capsule  3  . PATADAY 0.2 % SOLN       . polyethylene glycol powder (GLYCOLAX/MIRALAX) powder Take 17 g by mouth 2 (two) times daily.  255 g  0  . potassium chloride SA (KLOR-CON M20) 20 MEQ  tablet Take 1 tablet (20 mEq total) by mouth 2 (two) times daily.  180 tablet  3  . rosuvastatin (CRESTOR) 10 MG tablet Take 5 mg by mouth daily.       . SYMBICORT 80-4.5 MCG/ACT inhaler Inhale 2 puffs into the lungs.      . venlafaxine XR (EFFEXOR-XR) 37.5 MG 24 hr capsule Take 1 capsule (37.5 mg total) by mouth daily.  30 capsule  1  . verapamil (CALAN) 80 MG tablet Take 1 tablet (80 mg total) by mouth daily.  90 tablet  3   No current facility-administered medications for this visit.    Suicidal Ideation: Negative Plan Formed: Negative Patient has means to carry out plan: Negative  Homicidal Ideation: Negative Plan Formed: Negative Patient has means to carry out plan: Negative  Neurologic: Headache: Negative Seizure: Negative Paresthesias: Negative  Past Medical Family: Reviewed Family History  Problem Relation Age of Onset  . Diabetes Mother   . Stroke Mother   . Heart disease Mother   . Hypertension Mother   . Hyperlipidemia Mother   . Anxiety disorder Mother   . Diabetes Brother   . Alcohol abuse Brother   . Drug abuse Brother   . Diabetes Maternal Aunt   . Diabetes Maternal Uncle   . Alcohol abuse Maternal Uncle   . Diabetes Maternal Grandmother   . Dementia Maternal Grandmother   . Depression Maternal Grandmother   . Alcohol abuse Father   . Alcohol abuse Paternal Uncle   . Alcohol abuse Maternal Grandfather   . Alcohol abuse Paternal Grandfather   . Alcohol abuse Maternal Uncle   . Alcohol abuse Paternal Uncle   . Alcohol abuse Paternal Uncle     Social History: Reviewed Current Place of Residence: [place], [place] Place of Birth:[place], [place] Family Members: Lives with husband of 13 year Marital Status:  Married Children: 2  Sons: 1  Daughters: 1 Relationships: Daughter is her main source of emotional support and her friend of 15 years. Education:  [employer] Problems/Performance: None Religious Beliefs/Practices: Printmaker. History of  Abuse: none Occupational Experiences: Has been disabled since 2008. 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 Military History:  None. Legal History: Patient denies. Hobbies/Interests: Patient reports   Medical Decision Making (Choose Three): Established Problem, Stable/Improving (1), Review of Psycho-Social Stressors (1),  Review and summation of old records (2) and Review of Medication Regimen & Side Effects (2)  Assessment:  AXIS I 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 Anxiety   . Diabetes mellitus   . GERD (gastroesophageal reflux disease)   . Hyperlipidemia   . Hypertension   . Anal fissure   . Arthritis   . Chronic headaches   . Colon polyps   . Depression   . Gallstones   . IBS (irritable bowel syndrome)   . Obesity   . Status post cardiac catheterization   . Anemia 03/17/2011  . Osteopenia 07/2011    t score -1.5 FRAX 2.1%/0%  . Vertigo     Occurs 6 times a month     AXIS IV problems with primary support group and marital stressors.  AXIS V 51-60 moderate symptoms   Treatment Plan/Recommendations:  Plan of Care:   1. Affirm with the patient that the medications are taken as ordered. Patient  expressed understanding of how their medications were to be used.    Laboratory: No labs warranted at this time.   Psychotherapy: Therapy: brief supportive therapy provided. Continue current services.   Medications:  Start the following psychiatric medications as written prior to this appointment:  a) Increase venlafaxine 75 mg. b) Alprazolam-prescribed by PCP as needed for panic attacks only. Patient has been using this for sleep. Has two weeks left. May consider switch to clonazepam if she still needs an anxiolytic. -Risks and benefits, side effects and alternatives discussed with patient, he/she was given an opportunity  to ask questions about his/her medication, illness, and treatment. All current psychiatric medications have been reviewed and discussed with the patient and adjusted as clinically appropriate. The patient has been provided an accurate and updated list of the medications being now prescribed.   Routine PRN Medications:  Negative  Consultations: The patient was encouraged to keep all PCP and specialty clinic appointments.   Safety Concerns:   Patient told to call clinic if any problems occur. Patient advised to go to  ER  if she should develop SI/HI, side effects, or if symptoms worsen. Has crisis numbers to call if needed.    Other:   8. Patient was instructed to return to clinic in 2 weeks.  9. The patient was advised to call and cancel their mental health appointment within 24 hours of the appointment, if they are unable to keep the appointment, as well as the three no show and termination from clinic policy. 10. The patient expressed understanding of the plan and agrees with the above.   Jacqulyn Cane, MD 5/6/20143:20 PM

## 2012-10-01 ENCOUNTER — Ambulatory Visit (HOSPITAL_BASED_OUTPATIENT_CLINIC_OR_DEPARTMENT_OTHER): Payer: Medicare Other

## 2012-10-01 VITALS — BP 125/72 | HR 85 | Temp 98.9°F

## 2012-10-01 DIAGNOSIS — D649 Anemia, unspecified: Secondary | ICD-10-CM

## 2012-10-01 DIAGNOSIS — E538 Deficiency of other specified B group vitamins: Secondary | ICD-10-CM

## 2012-10-01 MED ORDER — CYANOCOBALAMIN 1000 MCG/ML IJ SOLN
1000.0000 ug | INTRAMUSCULAR | Status: DC
  Administered 2012-10-01: 1000 ug via INTRAMUSCULAR

## 2012-10-04 ENCOUNTER — Ambulatory Visit (INDEPENDENT_AMBULATORY_CARE_PROVIDER_SITE_OTHER): Payer: Medicare Other | Admitting: Psychiatry

## 2012-10-04 ENCOUNTER — Encounter (HOSPITAL_COMMUNITY): Payer: Self-pay | Admitting: Psychiatry

## 2012-10-04 ENCOUNTER — Other Ambulatory Visit (HOSPITAL_COMMUNITY): Payer: Self-pay | Admitting: Psychiatry

## 2012-10-04 VITALS — BP 103/79 | HR 93 | Ht 66.0 in | Wt 224.0 lb

## 2012-10-04 DIAGNOSIS — F331 Major depressive disorder, recurrent, moderate: Secondary | ICD-10-CM

## 2012-10-04 DIAGNOSIS — F411 Generalized anxiety disorder: Secondary | ICD-10-CM

## 2012-10-04 DIAGNOSIS — F339 Major depressive disorder, recurrent, unspecified: Secondary | ICD-10-CM

## 2012-10-04 MED ORDER — VENLAFAXINE HCL ER 75 MG PO CP24: 150.0000 mg | ORAL_CAPSULE | Freq: Every day | ORAL | Status: DC

## 2012-10-04 MED ORDER — TRAZODONE HCL 50 MG PO TABS: ORAL_TABLET | ORAL | Status: DC

## 2012-10-04 MED ORDER — VENLAFAXINE HCL ER 150 MG PO CP24: 150.0000 mg | ORAL_CAPSULE | Freq: Every day | ORAL | Status: DC

## 2012-10-04 NOTE — Progress Notes (Signed)
[facility] Follow-up Outpatient Visit    Patient Identification:  Kayla Mayo  Date of Evaluation:  10/04/2012  Chief Complaint:    Chief Complaint  Patient presents with  . Follow-up   History of Chief Complaint:  HPI Comments: Chief Complaint:  SUBJECTIVE: Ms. Kaczmarczyk is a 57 year old female with a diagnosis of Generalized Anxiety Disorder and Major Depressive Disorder, recurrent, moderate.  The patient reports that she has been doing well with her current medications.  She states her anxiety an obsessions have improved with increase of venlafaxine to 150 mg daily. The patient states she is taking all his medications and denies any side effects from his medications. She denies any other complaints.   In the area of affective symptoms, patient appears . Patient denies current suicidal ideation, intent, or plan. Patient denies current homicidal ideation, intent, or plan. Patient denies auditory hallucinations. Patient denies visual hallucinations. Patient denies symptoms of paranoia. Patient states sleep is still poor, with approximately 4 hours of sleep per night. Appetite is improving. Energy level is fair. Patient endorses a decrease in  symptoms of anhedonia. Patient denies hopelessness, helplessness, or guilt.   Denies any recent episodes consistent with mania, particularly decreased need for sleep with increased energy, grandiosity, impulsivity, hyperverbal and pressured speech, or increased productivity. Denies any recent symptoms consistent with psychosis, particularly auditory or visual hallucinations, thought broadcasting/insertion/withdrawal, or ideas of reference. Also denies excessive worry to the point of physical symptoms as well as any panic attacks. Denies any history of trauma or symptoms consistent with PTSD such as flashbacks, nightmares, hypervigilance, feelings of numbness or inability to connect with others.   Review of Systems  Constitutional:  Negative for fever, chills, activity change, appetite change and fatigue.  Respiratory: Negative for apnea, cough and wheezing.   Cardiovascular: Negative for leg swelling.  Gastrointestinal: Positive for constipation and abdominal distention. Negative for vomiting, abdominal pain, diarrhea, blood in stool and anal bleeding.  Musculoskeletal:       Gait abnormalities due to knee pain. Muscle strength normal.  Neurological: Negative for tremors, seizures, syncope and numbness (In right hand.).   Psychiatric: Agitation: Negative Hallucination: Negative Depressed Mood: Negative Insomnia: Negative Hypersomnia: Yes Altered Concentration: No Feels Worthless: No Grandiose Ideas: Negative Belief In Special Powers: Negative New/Increased Substance Abuse: Negative Compulsions: Negative  Traumatic Brain Injury: Negative  Filed Vitals:   10/04/12 1552  BP: 103/79  Pulse: 93  Height: 5\' 6"  (1.676 m)  Weight: 224 lb (101.606 kg)   Physical Exam  Vitals reviewed. Constitutional: She appears well-developed and well-nourished. No distress.  Skin: She is not diaphoretic.   General Appearance: alert, oriented, no acute distress, obese and cachectic  Musculoskeletal: Strength & Muscle Tone: within normal limits Gait & Station: normal Patient leans: N/A  Psychiatric Specialty Examination: Objective:  Appearance: Casual and Fairly Groomed  Patent attorney::  Fair  Speech:  Clear and Coherent and Normal Rate  Volume:  Normal  Mood:  " good"  5-6/10  (0=Very depressed; 5=Neutral; 10=Very Happy)   Affect:  Appropriate, Congruent and Full Range  Thought Process:  Coherent, Linear and Logical  Orientation:  Full (Time, Place, and Person)  Thought Content:  WDL  Suicidal Thoughts:  No  Homicidal Thoughts:  No  Judgement:  Fair  Insight:  Fair  Psychomotor Activity:  Normal  Akathisia:  No  Handed:  Right  Memory: 3/3 immediate; 3/3 recent  AIMS (if indicated):  Not indicate  Assets:   Communication  Skills Desire for Improvement Financial Resources/Insurance Housing Intimacy Leisure Time Physical Health Resilience Social Support Talents/Skills Transportation    Laboratory/X-Ray Psychological Evaluation(s)   None  None   Past Psychiatric History: Reviewed Diagnosis: Adjustment Disorder with mixed emotions  Hospitalizations: Patient denies.  Outpatient Care: Yes-first in 1991 (first husband left her), and in 2010 (due to marital stressors)  Substance Abuse Care: Patient denies.  Self-Mutilation: Patient denies.  Suicidal Attempts: Patient denies.  Violent Behaviors: none reported  History of Electroconvulsive Shock Therapy: Negative    Past Medical History:  Reviewed Past Medical History  Diagnosis Date  . Anxiety   . Diabetes mellitus   . GERD (gastroesophageal reflux disease)   . Hyperlipidemia   . Hypertension   . Anal fissure   . Arthritis   . Chronic headaches   . Colon polyps   . Depression   . Gallstones   . IBS (irritable bowel syndrome)   . Obesity   . Status post cardiac catheterization   . Anemia 03/17/2011  . Osteopenia 07/2011    t score -1.5 FRAX 2.1%/0%  . Vertigo     Occurs 6 times a month   History of Loss of Consciousness:  No Seizure History:  Negative Cardiac History:  Yes Hyperlidiemia and Hypertension Allergies:  Reviewed Allergies  Allergen Reactions  . Chicken Allergy     Sinus drainage  . Codeine   . Corn-Containing Products   . Other     Statins  . Peanut-Containing Drug Products   . Sesame Oil     Sinus drainage  . Simvastatin     Muscle pain  . Wheat     Sinus drainage   Current Medications: Reviewed Current Outpatient Prescriptions  Medication Sig Dispense Refill  . ALPRAZolam (XANAX) 0.5 MG tablet Take 1 tablet (0.5 mg total) by mouth 2 (two) times daily.  180 tablet  3  . Azelastine-Fluticasone (DYMISTA NA) Place into the nose.      . clobetasol (TEMOVATE) 0.05 % external solution Use as  directed  25 mL  0  . cyanocobalamin (,VITAMIN B-12,) 1000 MCG/ML injection Inject 1,000 mcg into the muscle every 30 (thirty) days.        Marland Kitchen EPIPEN 2-PAK 0.3 MG/0.3ML DEVI as needed.      . folic acid (FOLVITE) 800 MCG tablet Take one and one half tablets by mouth daily  135 tablet  3  . furosemide (LASIX) 20 MG tablet Take 1 tablet (20 mg total) by mouth 2 (two) times daily.  180 tablet  3  . hydrocortisone (PROCTOSOL HC) 2.5 % rectal cream Place 1 application rectally as needed.  30 g  0  . Liraglutide (VICTOZA ) Inject into the skin.      Marland Kitchen loratadine (CLARITIN) 10 MG tablet Take 10 mg by mouth daily.      Marland Kitchen lubiprostone (AMITIZA) 24 MCG capsule Take 1 capsule (24 mcg total) by mouth 2 (two) times daily with a meal.  180 capsule  3  . metFORMIN (GLUCOPHAGE) 500 MG tablet Take 1 tablet (500 mg total) by mouth 2 (two) times daily with a meal.  180 tablet  3  . mirabegron ER (MYRBETRIQ) 25 MG TB24 Take 25 mg by mouth daily.      . montelukast (SINGULAIR) 10 MG tablet       . nabumetone (RELAFEN) 500 MG tablet Take 500 mg by mouth 2 (two) times daily.      . NON FORMULARY Allergy injections: not given at  our office      . omeprazole (PRILOSEC) 20 MG capsule Take 1 capsule (20 mg total) by mouth 2 (two) times daily.  180 capsule  3  . PATADAY 0.2 % SOLN       . polyethylene glycol powder (GLYCOLAX/MIRALAX) powder Take 17 g by mouth 2 (two) times daily.  255 g  0  . rosuvastatin (CRESTOR) 10 MG tablet Take 5 mg by mouth daily.       . SYMBICORT 80-4.5 MCG/ACT inhaler Inhale 2 puffs into the lungs.      . venlafaxine XR (EFFEXOR-XR) 75 MG 24 hr capsule Take 2 capsules (150 mg total) by mouth daily.  30 capsule  1  . verapamil (CALAN) 80 MG tablet Take 1 tablet (80 mg total) by mouth daily.  90 tablet  3  . aspirin-acetaminophen-caffeine (EXCEDRIN MIGRAINE) 250-250-65 MG per tablet Take 1 tablet by mouth every 6 (six) hours as needed.      . enalapril (VASOTEC) 20 MG tablet Take 1 tablet (20 mg  total) by mouth daily.  90 tablet  3  . meclizine (ANTIVERT) 25 MG tablet Take 25 mg by mouth 3 (three) times daily as needed.        . potassium chloride SA (KLOR-CON M20) 20 MEQ tablet Take 1 tablet (20 mEq total) by mouth 2 (two) times daily.  180 tablet  3   No current facility-administered medications for this visit.    Suicidal Ideation: Negative Plan Formed: Negative Patient has means to carry out plan: Negative  Homicidal Ideation: Negative Plan Formed: Negative Patient has means to carry out plan: Negative  Neurologic: Headache: Negative Seizure: Negative Paresthesias: Negative  Past Medical Family: Reviewed Family History  Problem Relation Age of Onset  . Diabetes Mother   . Stroke Mother   . Heart disease Mother   . Hypertension Mother   . Hyperlipidemia Mother   . Anxiety disorder Mother   . Diabetes Brother   . Alcohol abuse Brother   . Drug abuse Brother   . Diabetes Maternal Aunt   . Diabetes Maternal Uncle   . Alcohol abuse Maternal Uncle   . Diabetes Maternal Grandmother   . Dementia Maternal Grandmother   . Depression Maternal Grandmother   . Alcohol abuse Father   . Alcohol abuse Paternal Uncle   . Alcohol abuse Maternal Grandfather   . Alcohol abuse Paternal Grandfather   . Alcohol abuse Maternal Uncle   . Alcohol abuse Paternal Uncle   . Alcohol abuse Paternal Uncle     Social History: Reviewed Current Place of Residence: [place], [place] Place of Birth:[place], [place] Family Members: Lives with husband of 13 year Marital Status:  Married Children: 2  Sons: 1  Daughters: 1 Relationships: Daughter is her main source of emotional support and her friend of 15 years. Education:  [employer] Problems/Performance: None Religious Beliefs/Practices: Printmaker. History of Abuse: none Occupational Experiences: Has been disabled since 2008. 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 Military History:   None. Legal History: Patient denies. Hobbies/Interests: Patient reports   Assessment:  AXIS I 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 Anxiety   . Diabetes mellitus   . GERD (gastroesophageal reflux disease)   . Hyperlipidemia   . Hypertension   . Anal fissure   . Arthritis   . Chronic  headaches   . Colon polyps   . Depression   . Gallstones   . IBS (irritable bowel syndrome)   . Obesity   . Status post cardiac catheterization   . Anemia 03/17/2011  . Osteopenia 07/2011    t score -1.5 FRAX 2.1%/0%  . Vertigo     Occurs 6 times a month     AXIS IV problems with primary support group and marital stressors.  AXIS V 51-60 moderate symptoms   Treatment Plan/Recommendations:  Plan of Care:   1. Affirm with the patient that the medications are taken as ordered. Patient  expressed understanding of how their medications were to be used.    Laboratory: No labs warranted at this time.   Psychotherapy: Therapy: brief supportive therapy provided. Continue current services.   Medications:  Continue the following psychiatric medications as written prior to this appointment:  a) Continue venlafaxine 150 mg.  b) Alprazolam-prescribed by PCP as needed for panic attacks only. Patient has been using this for sleep. Has two weeks left. May consider switch to clonazepam if she still needs an anxiolytic. C) Trazodone 50 mg-Take one-half to three tablets daily. -Risks and benefits, side effects and alternatives discussed with patient, he/she was given an opportunity to ask questions about his/her medication, illness, and treatment. All current psychiatric medications have been reviewed and discussed with the patient and adjusted as clinically appropriate. The patient has been provided an accurate and updated list of the medications being now prescribed.   Routine PRN Medications:   Negative  Consultations: The patient was encouraged to keep all PCP and specialty clinic appointments.   Safety Concerns:   Patient told to call clinic if any problems occur. Patient advised to go to  ER  if she should develop SI/HI, side effects, or if symptoms worsen. Has crisis numbers to call if needed.    Other:   8. Patient was instructed to return to clinic in 4 weeks.  9. The patient was advised to call and cancel their mental health appointment within 24 hours of the appointment, if they are unable to keep the appointment, as well as the three no show and termination from clinic policy. 10. The patient expressed understanding of the plan and agrees with the above.   Jacqulyn Cane, MD 6/5/20143:49 PM

## 2012-10-06 DIAGNOSIS — F339 Major depressive disorder, recurrent, unspecified: Secondary | ICD-10-CM | POA: Insufficient documentation

## 2012-10-29 ENCOUNTER — Ambulatory Visit (HOSPITAL_BASED_OUTPATIENT_CLINIC_OR_DEPARTMENT_OTHER): Payer: Medicare Other

## 2012-10-29 VITALS — BP 117/71 | HR 84 | Temp 98.8°F

## 2012-10-29 DIAGNOSIS — D649 Anemia, unspecified: Secondary | ICD-10-CM

## 2012-10-29 DIAGNOSIS — E538 Deficiency of other specified B group vitamins: Secondary | ICD-10-CM

## 2012-10-29 MED ORDER — CYANOCOBALAMIN 1000 MCG/ML IJ SOLN
1000.0000 ug | INTRAMUSCULAR | Status: DC
  Administered 2012-10-29: 1000 ug via INTRAMUSCULAR

## 2012-11-05 ENCOUNTER — Encounter (HOSPITAL_COMMUNITY): Payer: Self-pay | Admitting: Psychiatry

## 2012-11-05 ENCOUNTER — Ambulatory Visit (INDEPENDENT_AMBULATORY_CARE_PROVIDER_SITE_OTHER): Payer: Medicare Other | Admitting: Psychiatry

## 2012-11-05 VITALS — BP 123/76 | HR 90 | Ht 66.0 in | Wt 222.0 lb

## 2012-11-05 DIAGNOSIS — F331 Major depressive disorder, recurrent, moderate: Secondary | ICD-10-CM

## 2012-11-05 DIAGNOSIS — F411 Generalized anxiety disorder: Secondary | ICD-10-CM

## 2012-11-05 MED ORDER — TRAZODONE HCL 100 MG PO TABS: 100.0000 mg | ORAL_TABLET | Freq: Every day | ORAL | Status: DC

## 2012-11-05 MED ORDER — VENLAFAXINE HCL ER 150 MG PO CP24: ORAL_CAPSULE | ORAL | Status: DC

## 2012-11-05 NOTE — Progress Notes (Signed)
[facility] Follow-up Outpatient Visit    Patient Identification:  Kayla Mayo  Date of Evaluation:  11/05/2012  Chief Complaint:    Chief Complaint  Patient presents with  . Follow-up   History of Chief Complaint:  HPI Comments: Chief Complaint:   SUBJECTIVE: Kayla Mayo is a 57 year old female with a diagnosis of Generalized Anxiety Disorder and Major Depressive Disorder, recurrent, moderate.  She reports she had an aggravating day. She provides care to her grandchildren to help her son and daughter-in-law. The patient reports that her daughter-in-law wants to send her daughter to and children's home due to behavioral issues and wanting respite.  Kayla Mayo reports she is completely against sending her granddaughter away and feels it is her daughter-in-laws attempt to get the patient to give more hours to caring for her grandchild.  She reports feeling frustrated as her daughter-in-law is a stay at home mother.  Her granddaughter has developmental problems (she is ten with and IQ level at 4 years.).  She reports that she has been to [place] and did not have any panic attacks. She states that her medications have been working well. She denies any other complaints.   In the area of affective symptoms, patient appears . Patient denies current suicidal ideation, intent, or plan. Patient denies current homicidal ideation, intent, or plan. Patient denies auditory hallucinations. Patient denies visual hallucinations. Patient denies symptoms of paranoia. Patient states sleep is still poor, with approximately 6 hours of sleep per night. Appetite is improving. Energy level is getting better. Patient endorses a decrease in  symptoms of anhedonia. Patient denies hopelessness, helplessness, or guilt.   Denies any recent episodes consistent with mania, particularly decreased need for sleep with increased energy, grandiosity, impulsivity, hyperverbal and pressured speech, or increased  productivity. Denies any recent symptoms consistent with psychosis, particularly auditory or visual hallucinations, thought broadcasting/insertion/withdrawal, or ideas of reference. Also denies excessive worry to the point of physical symptoms as well as any panic attacks. Denies any history of trauma or symptoms consistent with PTSD such as flashbacks, nightmares, hypervigilance, feelings of numbness or inability to connect with others.   Review of Systems  Constitutional: Negative for fever, chills, activity change, appetite change and fatigue.  Respiratory: Negative for apnea, cough and wheezing.   Cardiovascular: Negative for leg swelling.  Gastrointestinal: Negative for vomiting, abdominal pain, diarrhea, constipation, blood in stool, abdominal distention and anal bleeding.  Musculoskeletal:       Gait abnormalities due to knee pain. Muscle strength normal.  Neurological: Negative for tremors, seizures, syncope and numbness (In right hand.).   Psychiatric: Agitation: Negative Hallucination: Negative Depressed Mood: Negative Insomnia: Negative Hypersomnia: Yes Altered Concentration: No Feels Worthless: No Grandiose Ideas: Negative Belief In Special Powers: Negative New/Increased Substance Abuse: Negative Compulsions: Negative  Traumatic Brain Injury: Negative  Filed Vitals:   11/05/12 1332  BP: 123/76  Pulse: 90  Height: 5\' 6"  (1.676 m)  Weight: 222 lb (100.699 kg)    Physical Exam  Vitals reviewed. Constitutional: She appears well-developed and well-nourished. No distress.  Skin: She is not diaphoretic.   General Appearance: alert, oriented, no acute distress, obese and cachectic Musculoskeletal: Strength & Muscle Tone: within normal limits Gait & Station: normal Patient leans: N/A  Psychiatric Specialty Examination: Objective:  Appearance: Casual and Fairly Groomed  Patent attorney::  Fair  Speech:  Clear and Coherent and Normal Rate  Volume:  Normal  Mood:  "  pretty good"  7/10  (0=Very depressed;  5=Neutral; 10=Very Happy)   Affect:  Appropriate, Congruent and Full Range  Thought Process:  Coherent, Linear and Logical  Orientation:  Full (Time, Place, and Person)  Thought Content:  WDL  Suicidal Thoughts:  No  Homicidal Thoughts:  No  Judgement:  Fair  Insight:  Fair  Psychomotor Activity:  Normal  Akathisia:  No  Handed:  Right  Memory: 3/3 immediate; 2/3 recent  AIMS (if indicated):  Not indicate  Assets:  Communication Skills Desire for Improvement Financial Resources/Insurance Housing Intimacy Leisure Time Physical Health Resilience Social Support Talents/Skills Transportation    Laboratory/X-Ray Psychological Evaluation(s)   None  None   Past Psychiatric History: Reviewed Diagnosis: Adjustment Disorder with mixed emotions  Hospitalizations: Patient denies.  Outpatient Care: Yes-first in 1991 (first husband left her), and in 2010 (due to marital stressors)  Substance Abuse Care: Patient denies.  Self-Mutilation: Patient denies.  Suicidal Attempts: Patient denies.  Violent Behaviors: none reported  History of Electroconvulsive Shock Therapy: Negative    Past Medical History:  Reviewed Past Medical History  Diagnosis Date  . Anxiety   . Diabetes mellitus   . GERD (gastroesophageal reflux disease)   . Hyperlipidemia   . Hypertension   . Anal fissure   . Arthritis   . Chronic headaches   . Colon polyps   . Depression   . Gallstones   . IBS (irritable bowel syndrome)   . Obesity   . Status post cardiac catheterization   . Anemia 03/17/2011  . Osteopenia 07/2011    t score -1.5 FRAX 2.1%/0%  . Vertigo     Occurs 6 times a month   History of Loss of Consciousness:  No Seizure History:  Negative Cardiac History:  Yes Hyperlidiemia and Hypertension Allergies:  Reviewed Allergies  Allergen Reactions  . Chicken Allergy     Sinus drainage  . Codeine   . Corn-Containing Products   . Other     Statins  .  Peanut-Containing Drug Products   . Sesame Oil     Sinus drainage  . Simvastatin     Muscle pain  . Wheat     Sinus drainage   Current Medications: Reviewed Current Outpatient Prescriptions  Medication Sig Dispense Refill  . ALPRAZolam (XANAX) 0.5 MG tablet Take 1 tablet (0.5 mg total) by mouth 2 (two) times daily.  180 tablet  3  . aspirin-acetaminophen-caffeine (EXCEDRIN MIGRAINE) 250-250-65 MG per tablet Take 1 tablet by mouth every 6 (six) hours as needed.      . Azelastine-Fluticasone (DYMISTA NA) Place into the nose.      . clobetasol (TEMOVATE) 0.05 % external solution Use as directed  25 mL  0  . cyanocobalamin (,VITAMIN B-12,) 1000 MCG/ML injection Inject 1,000 mcg into the muscle every 30 (thirty) days.        . enalapril (VASOTEC) 20 MG tablet Take 1 tablet (20 mg total) by mouth daily.  90 tablet  3  . EPIPEN 2-PAK 0.3 MG/0.3ML DEVI as needed.      . folic acid (FOLVITE) 800 MCG tablet Take one and one half tablets by mouth daily  135 tablet  3  . furosemide (LASIX) 20 MG tablet Take 1 tablet (20 mg total) by mouth 2 (two) times daily.  180 tablet  3  . hydrocortisone (PROCTOSOL HC) 2.5 % rectal cream Place 1 application rectally as needed.  30 g  0  . Liraglutide (VICTOZA [place]) Inject into the skin.      Marland Kitchen loratadine (CLARITIN)  10 MG tablet Take 10 mg by mouth daily.      Marland Kitchen lubiprostone (AMITIZA) 24 MCG capsule Take 1 capsule (24 mcg total) by mouth 2 (two) times daily with a meal.  180 capsule  3  . meclizine (ANTIVERT) 25 MG tablet Take 25 mg by mouth 3 (three) times daily as needed.        . metFORMIN (GLUCOPHAGE) 500 MG tablet Take 1 tablet (500 mg total) by mouth 2 (two) times daily with a meal.  180 tablet  3  . mirabegron ER (MYRBETRIQ) 25 MG TB24 Take 25 mg by mouth daily.      . montelukast (SINGULAIR) 10 MG tablet       . nabumetone (RELAFEN) 500 MG tablet Take 500 mg by mouth 2 (two) times daily.      . NON FORMULARY Allergy injections: not given at our office       . omeprazole (PRILOSEC) 20 MG capsule Take 1 capsule (20 mg total) by mouth 2 (two) times daily.  180 capsule  3  . PATADAY 0.2 % SOLN       . polyethylene glycol powder (GLYCOLAX/MIRALAX) powder Take 17 g by mouth 2 (two) times daily.  255 g  0  . potassium chloride SA (KLOR-CON M20) 20 MEQ tablet Take 1 tablet (20 mEq total) by mouth 2 (two) times daily.  180 tablet  3  . rosuvastatin (CRESTOR) 10 MG tablet Take 5 mg by mouth daily.       . SYMBICORT 80-4.5 MCG/ACT inhaler Inhale 2 puffs into the lungs.      . traZODone (DESYREL) 100 MG tablet Take 1 tablet (100 mg total) by mouth daily.  90 tablet  3  . traZODone (DESYREL) 50 MG tablet TAKE 1 TO 3 TABLETS BY MOUTH AT BEDTIME FOR INSOMNIA  270 tablet  1  . venlafaxine XR (EFFEXOR-XR) 150 MG 24 hr capsule TAKE ONE CAPSULE BY MOUTH DAILY  90 capsule  1  . verapamil (CALAN) 80 MG tablet Take 1 tablet (80 mg total) by mouth daily.  90 tablet  3   No current facility-administered medications for this visit.    Suicidal Ideation: Negative Plan Formed: Negative Patient has means to carry out plan: Negative  Homicidal Ideation: Negative Plan Formed: Negative Patient has means to carry out plan: Negative  Neurologic: Headache: Negative Seizure: Negative Paresthesias: Negative  Past Medical Family: Reviewed Family History  Problem Relation Age of Onset  . Diabetes Mother   . Stroke Mother   . Heart disease Mother   . Hypertension Mother   . Hyperlipidemia Mother   . Anxiety disorder Mother   . Diabetes Brother   . Alcohol abuse Brother   . Drug abuse Brother   . Diabetes Maternal Aunt   . Diabetes Maternal Uncle   . Alcohol abuse Maternal Uncle   . Diabetes Maternal Grandmother   . Dementia Maternal Grandmother   . Depression Maternal Grandmother   . Alcohol abuse Father   . Alcohol abuse Paternal Uncle   . Alcohol abuse Maternal Grandfather   . Alcohol abuse Paternal Grandfather   . Alcohol abuse Maternal Uncle   . Alcohol  abuse Paternal Uncle   . Alcohol abuse Paternal Uncle     Social History: Reviewed Current Place of Residence: [place], [place] Place of Birth:[place], [place] Family Members: Lives with husband of 13 year Marital Status:  Married Children: 2  Sons: 1  Daughters: 1 Relationships: Daughter is her main source of  emotional support and her friend of 15 years. Education:  [employer] Problems/Performance: None Religious Beliefs/Practices: Printmaker. History of Abuse: none Occupational Experiences: Has been disabled since 2008. 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 Military History:  None. Legal History: Patient denies. Hobbies/Interests: Patient reports   Assessment:  AXIS I 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 Anxiety   . Diabetes mellitus   . GERD (gastroesophageal reflux disease)   . Hyperlipidemia   . Hypertension   . Anal fissure   . Arthritis   . Chronic headaches   . Colon polyps   . Depression   . Gallstones   . IBS (irritable bowel syndrome)   . Obesity   . Status post cardiac catheterization   . Anemia 03/17/2011  . Osteopenia 07/2011    t score -1.5 FRAX 2.1%/0%  . Vertigo     Occurs 6 times a month     AXIS IV problems with primary support group and marital stressors.  AXIS V 51-60 moderate symptoms   Treatment Plan/Recommendations:  Plan of Care:   1. Affirm with the patient that the medications are taken as ordered. Patient  expressed understanding of how their medications were to be used.    Laboratory: No labs warranted at this time.   Psychotherapy: Therapy: brief supportive therapy provided. Continue current services.   Medications:  Continue the following psychiatric medications as written prior to this appointment:  a) Continue venlafaxine 150 mg.  b) Alprazolam-prescribed  by PCP as needed for panic attacks only. Patient has been using this for sleep. Has two weeks left. May consider switch to clonazepam if she still needs an anxiolytic. C) Trazodone 100 mg-Take one-half one tablet at bedtime.  -Risks and benefits, side effects and alternatives discussed with patient, he/she was given an opportunity to ask questions about his/her medication, illness, and treatment. All current psychiatric medications have been reviewed and discussed with the patient and adjusted as clinically appropriate. The patient has been provided an accurate and updated list of the medications being now prescribed.   Routine PRN Medications:  Negative  Consultations: The patient was encouraged to keep all PCP and specialty clinic appointments.   Safety Concerns:   Patient told to call clinic if any problems occur. Patient advised to go to  ER  if she should develop SI/HI, side effects, or if symptoms worsen. Has crisis numbers to call if needed.    Other:   8. Patient was instructed to return to clinic in 3 months.  9. The patient was advised to call and cancel their mental health appointment within 24 hours of the appointment, if they are unable to keep the appointment, as well as the three no show and termination from clinic policy. 10. The patient expressed understanding of the plan and agrees with the above.   Jacqulyn Cane, MD 7/7/20141:30 PM

## 2012-11-26 ENCOUNTER — Ambulatory Visit (HOSPITAL_BASED_OUTPATIENT_CLINIC_OR_DEPARTMENT_OTHER): Payer: Medicare Other

## 2012-11-26 VITALS — BP 98/59 | HR 85 | Temp 99.1°F

## 2012-11-26 DIAGNOSIS — E538 Deficiency of other specified B group vitamins: Secondary | ICD-10-CM

## 2012-11-26 DIAGNOSIS — D649 Anemia, unspecified: Secondary | ICD-10-CM

## 2012-11-26 MED ORDER — CYANOCOBALAMIN 1000 MCG/ML IJ SOLN
1000.0000 ug | INTRAMUSCULAR | Status: DC
  Administered 2012-11-26: 1000 ug via INTRAMUSCULAR

## 2012-12-24 ENCOUNTER — Ambulatory Visit: Payer: Medicare Other

## 2012-12-25 ENCOUNTER — Ambulatory Visit (HOSPITAL_BASED_OUTPATIENT_CLINIC_OR_DEPARTMENT_OTHER): Payer: Medicare Other

## 2012-12-25 VITALS — BP 132/79 | HR 83 | Temp 99.2°F

## 2012-12-25 DIAGNOSIS — D649 Anemia, unspecified: Secondary | ICD-10-CM

## 2012-12-25 DIAGNOSIS — E538 Deficiency of other specified B group vitamins: Secondary | ICD-10-CM

## 2012-12-25 MED ORDER — CYANOCOBALAMIN 1000 MCG/ML IJ SOLN
1000.0000 ug | INTRAMUSCULAR | Status: DC
  Administered 2012-12-25: 1000 ug via INTRAMUSCULAR

## 2013-01-21 ENCOUNTER — Ambulatory Visit (HOSPITAL_BASED_OUTPATIENT_CLINIC_OR_DEPARTMENT_OTHER): Payer: Medicare Other

## 2013-01-21 VITALS — BP 136/83 | HR 84 | Temp 98.9°F

## 2013-01-21 DIAGNOSIS — D649 Anemia, unspecified: Secondary | ICD-10-CM

## 2013-01-21 DIAGNOSIS — E538 Deficiency of other specified B group vitamins: Secondary | ICD-10-CM

## 2013-01-21 MED ORDER — CYANOCOBALAMIN 1000 MCG/ML IJ SOLN
1000.0000 ug | INTRAMUSCULAR | Status: DC
  Administered 2013-01-21: 1000 ug via INTRAMUSCULAR

## 2013-02-05 ENCOUNTER — Ambulatory Visit (HOSPITAL_COMMUNITY): Payer: Self-pay | Admitting: Psychiatry

## 2013-02-07 ENCOUNTER — Ambulatory Visit (INDEPENDENT_AMBULATORY_CARE_PROVIDER_SITE_OTHER): Payer: Medicare Other | Admitting: Psychiatry

## 2013-02-07 ENCOUNTER — Encounter (HOSPITAL_COMMUNITY): Payer: Self-pay | Admitting: Psychiatry

## 2013-02-07 VITALS — BP 135/85 | HR 90 | Ht 66.0 in | Wt 228.0 lb

## 2013-02-07 DIAGNOSIS — F331 Major depressive disorder, recurrent, moderate: Secondary | ICD-10-CM

## 2013-02-07 DIAGNOSIS — F411 Generalized anxiety disorder: Secondary | ICD-10-CM

## 2013-02-07 MED ORDER — VENLAFAXINE HCL ER 150 MG PO CP24: ORAL_CAPSULE | ORAL | Status: DC

## 2013-02-07 MED ORDER — VENLAFAXINE HCL ER 37.5 MG PO CP24: ORAL_CAPSULE | ORAL | Status: DC

## 2013-02-07 MED ORDER — TRAZODONE HCL 100 MG PO TABS: 150.0000 mg | ORAL_TABLET | Freq: Every day | ORAL | Status: DC

## 2013-02-07 NOTE — Progress Notes (Signed)
[facility] Follow-up Outpatient Visit  Marlyss Cissell [facility] 1955-07-28    Patient Identification:  Kayla Mayo  Date of Evaluation:  02/07/2013  Chief Complaint:    Chief Complaint  Patient presents with  . Follow-up   History of Chief Complaint:  HPI Comments: Chief Complaint:   SUBJECTIVE: Kayla Mayo is a 57 year old female with a diagnosis of Generalized Anxiety Disorder and Major Depressive Disorder, recurrent, moderate.   She reports that she has been having a very difficult time finding a therapist. She states she had several stressors over the past few months. Her niece by marriage whom she had a close relationship with died Jan 16, 2023 due to medical complications.  She states that she had several problems with her car and her daughter's car which has led to several financial issues.  The patient continues to have marital stressors which have not been address secondary to a lack of communications. She reports she is taking her medications as denies any side effects.   In the area of affective symptoms, patient appears . Patient denies current suicidal ideation, intent, or plan. Patient denies current homicidal ideation, intent, or plan. Patient denies auditory hallucinations. Patient denies visual hallucinations. Patient denies symptoms of paranoia. Patient states sleep is still poor, with approximately 6 hours of sleep per night. Appetite is improving. Energy level is poor. Patient endorsessymptoms of anhedonia. Patient denies hopelessness, helplessness, or guilt.   Denies any recent episodes consistent with mania, particularly decreased need for sleep with increased energy, grandiosity, impulsivity, hyperverbal and pressured speech, or increased productivity. Denies any recent symptoms consistent with psychosis, particularly auditory or visual hallucinations, thought broadcasting/insertion/withdrawal, or ideas of reference. Also denies excessive worry to the point of  physical symptoms as well as any panic attacks. Denies any history of trauma or symptoms consistent with PTSD such as flashbacks, nightmares, hypervigilance, feelings of numbness or inability to connect with others.   Review of Systems  Constitutional: Negative for fever, chills, activity change, appetite change and fatigue.  Respiratory: Negative for apnea, cough and wheezing.   Cardiovascular: Negative for leg swelling.  Gastrointestinal: Negative for vomiting, abdominal pain, diarrhea, constipation, blood in stool, abdominal distention and anal bleeding.  Musculoskeletal:       Gait abnormalities due to knee pain. Muscle strength normal.  Neurological: Negative for tremors, seizures, syncope and numbness (In right hand.).   Psychiatric: Agitation: Negative Hallucination: Negative Depressed Mood: Negative Insomnia: Negative Hypersomnia: Yes Altered Concentration: No Feels Worthless: No Grandiose Ideas: Negative Belief In Special Powers: Negative New/Increased Substance Abuse: Negative Compulsions: Negative  Traumatic Brain Injury: Negative  Filed Vitals:   02/07/13 1515  BP: 135/85  Pulse: 90  Height: 5\' 6"  (1.676 m)  Weight: 228 lb (103.42 kg)   Physical Exam  Vitals reviewed. Constitutional: She appears well-developed and well-nourished. No distress.  Skin: She is not diaphoretic.  Musculoskeletal: Strength & Muscle Tone: within normal limits Gait & Station: normal Patient leans: N/A  Filed Vitals:   02/07/13 1515  BP: 135/85  Pulse: 90  Height: 5\' 6"  (1.676 m)  Weight: 228 lb (103.42 kg)   General Appearance: alert, oriented, no acute distress, obese and cachectic Musculoskeletal: Strength & Muscle Tone: within normal limits Gait & Station: normal Patient leans: N/A  Psychiatric Specialty Examination: Objective:  Appearance: Casual and Fairly Groomed  Patent attorney::  Fair  Speech:  Clear and Coherent and Normal Rate  Volume:  Normal  Mood:  "awful"    Depression: 4-5/10 (0=Very  depressed; 5=Neutral; 10=Very Happy)  Anxiety- 9/10 (0=no anxiety; 5= moderate/tolerable anxiety; 10= panic attacks)   Affect:  Appropriate, Congruent and Full Range  Thought Process:  Coherent, Linear and Logical  Orientation:  Full (Time, Place, and Person)  Thought Content:  WDL  Suicidal Thoughts:  No  Homicidal Thoughts:  No  Judgement:  Fair  Insight:  Fair  Psychomotor Activity:  Normal  Akathisia:  No  Handed:  Right  Memory: 3/3 immediate; 1/3 recent  AIMS (if indicated):  Not indicate  Assets:  Communication Skills Desire for Improvement Financial Resources/Insurance Housing Intimacy Leisure Time Physical Health Resilience Social Support Talents/Skills Transportation    Laboratory/X-Ray Psychological Evaluation(s)   None  None   Past Psychiatric History: Reviewed Diagnosis: Adjustment Disorder with mixed emotions  Hospitalizations: Patient denies.  Outpatient Care: Yes-first in 1991 (first husband left her), and in 2010 (due to marital stressors)  Substance Abuse Care: Patient denies.  Self-Mutilation: Patient denies.  Suicidal Attempts: Patient denies.  Violent Behaviors: none reported  History of Electroconvulsive Shock Therapy: Negative    Past Medical History:  Reviewed Past Medical History  Diagnosis Date  . Anxiety   . Diabetes mellitus   . GERD (gastroesophageal reflux disease)   . Hyperlipidemia   . Hypertension   . Anal fissure   . Arthritis   . Chronic headaches   . Colon polyps   . Depression   . Gallstones   . IBS (irritable bowel syndrome)   . Obesity   . Status post cardiac catheterization   . Anemia 03/17/2011  . Osteopenia 07/2011    t score -1.5 FRAX 2.1%/0%  . Vertigo     Occurs 6 times a month   History of Loss of Consciousness:  No Seizure History:  Negative Cardiac History:  Yes Hyperlidiemia and Hypertension Allergies:  Reviewed Allergies  Allergen Reactions  . Chicken Allergy      Sinus drainage  . Codeine   . Corn-Containing Products   . Other     Statins  . Peanut-Containing Drug Products   . Sesame Oil     Sinus drainage  . Simvastatin     Muscle pain  . Wheat     Sinus drainage   Current Medications: Reviewed Current Outpatient Prescriptions  Medication Sig Dispense Refill  . ALPRAZolam (XANAX) 0.5 MG tablet Take 1 tablet (0.5 mg total) by mouth 2 (two) times daily.  180 tablet  3  . aspirin-acetaminophen-caffeine (EXCEDRIN MIGRAINE) 250-250-65 MG per tablet Take 1 tablet by mouth every 6 (six) hours as needed.      . Azelastine-Fluticasone (DYMISTA NA) Place into the nose.      . clobetasol (TEMOVATE) 0.05 % external solution Use as directed  25 mL  0  . cyanocobalamin (,VITAMIN B-12,) 1000 MCG/ML injection Inject 1,000 mcg into the muscle every 30 (thirty) days.        . enalapril (VASOTEC) 20 MG tablet Take 1 tablet (20 mg total) by mouth daily.  90 tablet  3  . EPIPEN 2-PAK 0.3 MG/0.3ML DEVI as needed.      . folic acid (FOLVITE) 800 MCG tablet Take one and one half tablets by mouth daily  135 tablet  3  . furosemide (LASIX) 20 MG tablet Take 1 tablet (20 mg total) by mouth 2 (two) times daily.  180 tablet  3  . hydrocortisone (PROCTOSOL HC) 2.5 % rectal cream Place 1 application rectally as needed.  30 g  0  . hydrocortisone 2.5 %  cream       . Liraglutide (VICTOZA [place]) Inject into the skin.      Marland Kitchen loratadine (CLARITIN) 10 MG tablet Take 10 mg by mouth daily.      Marland Kitchen lubiprostone (AMITIZA) 24 MCG capsule Take 1 capsule (24 mcg total) by mouth 2 (two) times daily with a meal.  180 capsule  3  . meclizine (ANTIVERT) 25 MG tablet Take 25 mg by mouth 3 (three) times daily as needed.        . metFORMIN (GLUCOPHAGE) 500 MG tablet Take 1 tablet (500 mg total) by mouth 2 (two) times daily with a meal.  180 tablet  3  . mirabegron ER (MYRBETRIQ) 25 MG TB24 Take 25 mg by mouth daily.      . montelukast (SINGULAIR) 10 MG tablet       . nabumetone (RELAFEN) 500 MG  tablet Take 500 mg by mouth 2 (two) times daily.      . NON FORMULARY Allergy injections: not given at our office      . omeprazole (PRILOSEC) 20 MG capsule Take 1 capsule (20 mg total) by mouth 2 (two) times daily.  180 capsule  3  . PATADAY 0.2 % SOLN       . polyethylene glycol powder (GLYCOLAX/MIRALAX) powder Take 17 g by mouth 2 (two) times daily.  255 g  0  . potassium chloride SA (KLOR-CON M20) 20 MEQ tablet Take 1 tablet (20 mEq total) by mouth 2 (two) times daily.  180 tablet  3  . rosuvastatin (CRESTOR) 10 MG tablet Take 5 mg by mouth daily.       . SYMBICORT 80-4.5 MCG/ACT inhaler Inhale 2 puffs into the lungs.      . traZODone (DESYREL) 100 MG tablet Take 1 tablet (100 mg total) by mouth daily. Take one to one and a half tablets at bedtime.  135 tablet  1  . venlafaxine XR (EFFEXOR-XR) 150 MG 24 hr capsule TAKE ONE CAPSULE BY MOUTH DAILY  90 capsule  1  . verapamil (CALAN) 80 MG tablet Take 1 tablet (80 mg total) by mouth daily.  90 tablet  3   No current facility-administered medications for this visit.    Suicidal Ideation: Negative Plan Formed: Negative Patient has means to carry out plan: Negative  Homicidal Ideation: Negative Plan Formed: Negative Patient has means to carry out plan: Negative  Neurologic: Headache: Negative Seizure: Negative Paresthesias: Negative  Past Medical Family: Reviewed Family History  Problem Relation Age of Onset  . Diabetes Mother   . Stroke Mother   . Heart disease Mother   . Hypertension Mother   . Hyperlipidemia Mother   . Anxiety disorder Mother   . Diabetes Brother   . Alcohol abuse Brother   . Drug abuse Brother   . Diabetes Maternal Aunt   . Diabetes Maternal Uncle   . Alcohol abuse Maternal Uncle   . Diabetes Maternal Grandmother   . Dementia Maternal Grandmother   . Depression Maternal Grandmother   . Alcohol abuse Father   . Alcohol abuse Paternal Uncle   . Alcohol abuse Maternal Grandfather   . Alcohol abuse Paternal  Grandfather   . Alcohol abuse Maternal Uncle   . Alcohol abuse Paternal Uncle   . Alcohol abuse Paternal Uncle     Social History: Reviewed Current Place of Residence: [place], [place] Place of Birth:[place], [place] Family Members: Lives with husband of 13 year Marital Status:  Married Children: 2  Sons: 1  Daughters: 1 Relationships: Daughter is her main source of emotional support and her friend of 15 years. Education:  [employer] Problems/Performance: None Religious Beliefs/Practices: Printmaker. History of Abuse: none Occupational Experiences: Has been disabled since 2008. 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 Military History:  None. Legal History: Patient denies. Hobbies/Interests: Patient reports   Assessment:  AXIS I 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 Anxiety   . Diabetes mellitus   . GERD (gastroesophageal reflux disease)   . Hyperlipidemia   . Hypertension   . Anal fissure   . Arthritis   . Chronic headaches   . Colon polyps   . Depression   . Gallstones   . IBS (irritable bowel syndrome)   . Obesity   . Status post cardiac catheterization   . Anemia 03/17/2011  . Osteopenia 07/2011    t score -1.5 FRAX 2.1%/0%  . Vertigo     Occurs 6 times a month     AXIS IV problems with primary support group and marital stressors.  AXIS V 51-60 moderate symptoms   Treatment Plan/Recommendations:  Plan of Care:   1. Affirm with the patient that the medications are taken as ordered. Patient  expressed understanding of how their medications were to be used.    Laboratory: No labs warranted at this time.   Psychotherapy: Therapy: brief supportive therapy provided. Continue current services.   Medications:  Continue the following psychiatric medications as written prior to this appointment:   a) Increase  venlafaxine 150 mg + 37.5 mg daily.  b) Alprazolam-prescribed by PCP as needed for panic attacks only. Patient has been using this for sleep. Has two weeks left. May consider switch to clonazepam if she still needs an anxiolytic. C) Trazodone 100 mg-Take one-half one tablet at bedtime.  -Risks and benefits, side effects and alternatives discussed with patient, he/she was given an opportunity to ask questions about his/her medication, illness, and treatment. All current psychiatric medications have been reviewed and discussed with the patient and adjusted as clinically appropriate. The patient has been provided an accurate and updated list of the medications being now prescribed.   Routine PRN Medications:  Negative  Consultations: The patient was encouraged to keep all PCP and specialty clinic appointments.   Safety Concerns:   Patient told to call clinic if any problems occur. Patient advised to go to  ER  if she should develop SI/HI, side effects, or if symptoms worsen. Has crisis numbers to call if needed.    Other:   8. Patient was instructed to return to clinic in 2 months.  9. The patient was advised to call and cancel their mental health appointment within 24 hours of the appointment, if they are unable to keep the appointment, as well as the three no show and termination from clinic policy. 10. The patient expressed understanding of the plan and agrees with the above.   Jacqulyn Cane, MD 10/9/20143:15 PM

## 2013-02-18 ENCOUNTER — Ambulatory Visit (HOSPITAL_BASED_OUTPATIENT_CLINIC_OR_DEPARTMENT_OTHER): Payer: Medicare Other

## 2013-02-18 VITALS — BP 135/88 | HR 81 | Temp 98.7°F

## 2013-02-18 DIAGNOSIS — E538 Deficiency of other specified B group vitamins: Secondary | ICD-10-CM

## 2013-02-18 DIAGNOSIS — D649 Anemia, unspecified: Secondary | ICD-10-CM

## 2013-02-18 MED ORDER — CYANOCOBALAMIN 1000 MCG/ML IJ SOLN
1000.0000 ug | INTRAMUSCULAR | Status: DC
  Administered 2013-02-18: 1000 ug via INTRAMUSCULAR

## 2013-03-06 ENCOUNTER — Encounter (INDEPENDENT_AMBULATORY_CARE_PROVIDER_SITE_OTHER): Payer: Self-pay

## 2013-03-06 ENCOUNTER — Ambulatory Visit (INDEPENDENT_AMBULATORY_CARE_PROVIDER_SITE_OTHER): Payer: Medicare Other | Admitting: Psychiatry

## 2013-03-06 ENCOUNTER — Encounter (HOSPITAL_COMMUNITY): Payer: Self-pay | Admitting: Psychiatry

## 2013-03-06 VITALS — BP 96/62 | HR 92 | Ht 66.0 in | Wt 217.0 lb

## 2013-03-06 DIAGNOSIS — F411 Generalized anxiety disorder: Secondary | ICD-10-CM

## 2013-03-06 DIAGNOSIS — F331 Major depressive disorder, recurrent, moderate: Secondary | ICD-10-CM

## 2013-03-06 MED ORDER — VENLAFAXINE HCL ER 150 MG PO CP24: ORAL_CAPSULE | ORAL | Status: DC

## 2013-03-06 MED ORDER — VENLAFAXINE HCL ER 37.5 MG PO CP24: ORAL_CAPSULE | ORAL | Status: DC

## 2013-03-06 NOTE — Progress Notes (Signed)
[facility] Follow-up Outpatient Visit  Elora Wolter [facility] Jan 14, 1956   Patient Identification:  Kayla Slim Servidio  Date of Evaluation:  03/06/2013  Chief Complaint:    Chief Complaint  Patient presents with  . Follow-up   History of Chief Complaint:  HPI Comments: Chief Complaint:   SUBJECTIVE: Ms. Blasco is a 57 year old female with a diagnosis of Generalized Anxiety Disorder and Major Depressive Disorder, recurrent, moderate.   The patient has been having some stress about her marriage.  She reports that her husband has been gambling in the form of buying lottery tickets.  She has been considering leaving her husband as he shows no affection to her and shows no signs of wanting to improve their relationship. She has talked to her children who are supportive of her decisions.  She reports she is taking her medications as denies any side effects.   In the area of affective symptoms, patient appears . Patient denies current suicidal ideation, intent, or plan. Patient denies current homicidal ideation, intent, or plan. Patient denies auditory hallucinations. Patient denies visual hallucinations. Patient denies symptoms of paranoia. Patient states sleep is still poor, with approximately 6-7 hours of sleep per night. Appetite is improving. Energy level is fair. Patient endorses some decrease of symptoms of anhedonia since going to the pool. Patient denies hopelessness, helplessness, or guilt.   Denies any recent episodes consistent with mania, particularly decreased need for sleep with increased energy, grandiosity, impulsivity, hyperverbal and pressured speech, or increased productivity. Denies any recent symptoms consistent with psychosis, particularly auditory or visual hallucinations, thought broadcasting/insertion/withdrawal, or ideas of reference. Also denies excessive worry to the point of physical symptoms as well as any panic attacks. Denies any history of trauma or symptoms  consistent with PTSD such as flashbacks, nightmares, hypervigilance, feelings of numbness or inability to connect with others.   Review of Systems  Constitutional: Negative for fever, chills, activity change, appetite change and fatigue.  Respiratory: Negative for apnea, cough and wheezing.   Cardiovascular: Negative for leg swelling.  Gastrointestinal: Negative for vomiting, abdominal pain, diarrhea, constipation, blood in stool, abdominal distention and anal bleeding.  Musculoskeletal:       Gait abnormalities due to knee pain. Muscle strength normal.  Neurological: Negative for tremors, seizures, syncope and numbness (In right hand.).   Psychiatric: Agitation: Negative Hallucination: Negative Depressed Mood: Negative Insomnia: Negative Hypersomnia: Yes Altered Concentration: No Feels Worthless: No Grandiose Ideas: Negative Belief In Special Powers: Negative New/Increased Substance Abuse: Negative Compulsions: Negative  Traumatic Brain Injury: Negative  Filed Vitals:   03/06/13 1007  BP: 107/65  Height: 5\' 6"  (1.676 m)  Weight: 217 lb (98.431 kg)    Physical Exam  Vitals reviewed. Constitutional: She appears well-developed and well-nourished. No distress.  Skin: She is not diaphoretic.  Musculoskeletal: Strength & Muscle Tone: within normal limits Gait & Station: normal Patient leans: N/A  General Appearance: alert, oriented, no acute distress, obese and cachectic Musculoskeletal: Strength & Muscle Tone: within normal limits Gait & Station: normal Patient leans: N/A  Psychiatric Specialty Examination: Objective:  Appearance: Casual and Fairly Groomed  Patent attorney::  Fair  Speech:  Clear and Coherent and Normal Rate  Volume:  Normal  Mood:  "so-so"   Depression: 5/10 (0=Very depressed; 5=Neutral; 10=Very Happy)  Anxiety- 5/10 (0=no anxiety; 5= moderate/tolerable anxiety; 10= panic attacks)   Affect:  Appropriate, Congruent and Full Range  Thought Process:   Coherent, Linear and Logical  Orientation:  Full (Time, Place, and Person)  Thought Content:  WDL  Suicidal Thoughts:  No  Homicidal Thoughts:  No  Judgement:  Fair  Insight:  Fair  Psychomotor Activity:  Normal  Akathisia:  No  Handed:  Right  Memory: 3/3 immediate; 2/3 recent  AIMS (if indicated):  Not indicate  Assets:  Communication Skills Desire for Improvement Financial Resources/Insurance Housing Intimacy Leisure Time Physical Health Resilience Social Support Talents/Skills Transportation    Laboratory/X-Ray Psychological Evaluation(s)   None  None   Past Psychiatric History: Reviewed Diagnosis: Adjustment Disorder with mixed emotions  Hospitalizations: Patient denies.  Outpatient Care: Yes-first in 1991 (first husband left her), and in 2010 (due to marital stressors)  Substance Abuse Care: Patient denies.  Self-Mutilation: Patient denies.  Suicidal Attempts: Patient denies.  Violent Behaviors: none reported  History of Electroconvulsive Shock Therapy: Negative    Past Medical History:  Reviewed Past Medical History  Diagnosis Date  . Anxiety   . Diabetes mellitus   . GERD (gastroesophageal reflux disease)   . Hyperlipidemia   . Hypertension   . Anal fissure   . Arthritis   . Chronic headaches   . Colon polyps   . Depression   . Gallstones   . IBS (irritable bowel syndrome)   . Obesity   . Status post cardiac catheterization   . Anemia 03/17/2011  . Osteopenia 07/2011    t score -1.5 FRAX 2.1%/0%  . Vertigo     Occurs 6 times a month  . Bursitis of hip    History of Loss of Consciousness:  No Seizure History:  Negative Cardiac History:  Yes Hyperlidiemia and Hypertension Allergies:  Reviewed  Allergies  Allergen Reactions  . Chicken Allergy     Sinus drainage  . Codeine   . Corn-Containing Products   . Other     Statins  . Peanut-Containing Drug Products   . Sesame Oil     Sinus drainage  . Simvastatin     Muscle pain  . Wheat      Sinus drainage   Current Medications: Reviewed Current Outpatient Prescriptions  Medication Sig Dispense Refill  . ALPRAZolam (XANAX) 0.5 MG tablet Take 1 tablet (0.5 mg total) by mouth 2 (two) times daily.  180 tablet  3  . aspirin-acetaminophen-caffeine (EXCEDRIN MIGRAINE) 250-250-65 MG per tablet Take 1 tablet by mouth every 6 (six) hours as needed.      . Azelastine-Fluticasone (DYMISTA NA) Place into the nose.      . clobetasol (TEMOVATE) 0.05 % external solution Use as directed  25 mL  0  . cyanocobalamin (,VITAMIN B-12,) 1000 MCG/ML injection Inject 1,000 mcg into the muscle every 30 (thirty) days.        . enalapril (VASOTEC) 20 MG tablet Take 1 tablet (20 mg total) by mouth daily.  90 tablet  3  . EPIPEN 2-PAK 0.3 MG/0.3ML DEVI as needed.      . folic acid (FOLVITE) 800 MCG tablet Take one and one half tablets by mouth daily  135 tablet  3  . furosemide (LASIX) 20 MG tablet Take 1 tablet (20 mg total) by mouth 2 (two) times daily.  180 tablet  3  . hydrocortisone (PROCTOSOL HC) 2.5 % rectal cream Place 1 application rectally as needed.  30 g  0  . Liraglutide (VICTOZA [place]) Inject into the skin.      Marland Kitchen loratadine (CLARITIN) 10 MG tablet Take 10 mg by mouth daily.      Marland Kitchen lubiprostone (AMITIZA) 24 MCG capsule Take 1  capsule (24 mcg total) by mouth 2 (two) times daily with a meal.  180 capsule  3  . meclizine (ANTIVERT) 25 MG tablet Take 25 mg by mouth 3 (three) times daily as needed.        . metFORMIN (GLUCOPHAGE) 500 MG tablet Take 1 tablet (500 mg total) by mouth 2 (two) times daily with a meal.  180 tablet  3  . mirabegron ER (MYRBETRIQ) 25 MG TB24 Take 25 mg by mouth daily.      . montelukast (SINGULAIR) 10 MG tablet       . nabumetone (RELAFEN) 500 MG tablet Take 500 mg by mouth 2 (two) times daily.      . NON FORMULARY Allergy injections: not given at our office      . omeprazole (PRILOSEC) 20 MG capsule Take 1 capsule (20 mg total) by mouth 2 (two) times daily.  180 capsule   3  . PATADAY 0.2 % SOLN       . polyethylene glycol powder (GLYCOLAX/MIRALAX) powder Take 17 g by mouth 2 (two) times daily.  255 g  0  . potassium chloride SA (KLOR-CON M20) 20 MEQ tablet Take 1 tablet (20 mEq total) by mouth 2 (two) times daily.  180 tablet  3  . rosuvastatin (CRESTOR) 10 MG tablet Take 5 mg by mouth daily.       . traZODone (DESYREL) 100 MG tablet Take 1.5 tablets (150 mg total) by mouth daily. Take one to one and a half tablets at bedtime.  45 tablet  1  . venlafaxine XR (EFFEXOR-XR) 150 MG 24 hr capsule TAKE ONE CAPSULE BY MOUTH DAILY. Take with 37.5 mg daily.  90 capsule  1  . venlafaxine XR (EFFEXOR-XR) 37.5 MG 24 hr capsule Take with 150 mg. Take one capsule for (37.5 mg) for 14 days, then increase to two capsules daily.  30 capsule  2  . verapamil (CALAN) 80 MG tablet Take 1 tablet (80 mg total) by mouth daily.  90 tablet  3   No current facility-administered medications for this visit.    Suicidal Ideation: Negative Plan Formed: Negative Patient has means to carry out plan: Negative  Homicidal Ideation: Negative Plan Formed: Negative Patient has means to carry out plan: Negative  Neurologic: Headache: Negative Seizure: Negative Paresthesias: Negative  Past Medical Family: Reviewed Family History  Problem Relation Age of Onset  . Diabetes Mother   . Stroke Mother   . Heart disease Mother   . Hypertension Mother   . Hyperlipidemia Mother   . Anxiety disorder Mother   . Diabetes Brother   . Alcohol abuse Brother   . Drug abuse Brother   . Diabetes Maternal Aunt   . Diabetes Maternal Uncle   . Alcohol abuse Maternal Uncle   . Diabetes Maternal Grandmother   . Dementia Maternal Grandmother   . Depression Maternal Grandmother   . Alcohol abuse Father   . Alcohol abuse Paternal Uncle   . Alcohol abuse Maternal Grandfather   . Alcohol abuse Paternal Grandfather   . Alcohol abuse Maternal Uncle   . Alcohol abuse Paternal Uncle   . Alcohol abuse Paternal  Uncle     Social History: Reviewed Current Place of Residence: [place], [place] Place of Birth:[place], [place] Family Members: Lives with husband of 13 year Marital Status:  Married Children: 2  Sons: 1  Daughters: 1 Relationships: Daughter is her main source of emotional support and her friend of 15 years. Education:  [employer]  Problems/Performance: None Religious Beliefs/Practices: Jehovah Witnesses. History of Abuse: none Occupational Experiences: Has been disabled since 2008. 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 Military History:  None. Legal History: Patient denies. Hobbies/Interests: Patient reports   Assessment:  AXIS I 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 Anxiety   . Diabetes mellitus   . GERD (gastroesophageal reflux disease)   . Hyperlipidemia   . Hypertension   . Anal fissure   . Arthritis   . Chronic headaches   . Colon polyps   . Depression   . Gallstones   . IBS (irritable bowel syndrome)   . Obesity   . Status post cardiac catheterization   . Anemia 03/17/2011  . Osteopenia 07/2011    t score -1.5 FRAX 2.1%/0%  . Vertigo     Occurs 6 times a month     AXIS IV problems with primary support group and marital stressors.  AXIS V 51-60 moderate symptoms   Treatment Plan/Recommendations:  Plan of Care:   1. Affirm with the patient that the medications are taken as ordered. Patient  expressed understanding of how their medications were to be used.    Laboratory: No labs warranted at this time.   Psychotherapy: Therapy: brief supportive therapy provided. Continue current services.   Medications:  Continue the following psychiatric medications as written prior to this appointment:  a) Increase  venlafaxine 150 mg + 37.5 mg with an additional 37.5 mg daily in 2 weeks for a total of 225 mg.  Patient instructed to call to report results.   b) Alprazolam-prescribed by PCP as needed for panic attacks only. Patient has been using this for sleep. Has two weeks left. May consider switch to clonazepam if she still needs an anxiolytic. C) Trazodone 100 mg-Take one-half one tablet at bedtime.  -Risks and benefits, side effects and alternatives discussed with patient, he/she was given an opportunity to ask questions about his/her medication, illness, and treatment. All current psychiatric medications have been reviewed and discussed with the patient and adjusted as clinically appropriate. The patient has been provided an accurate and updated list of the medications being now prescribed.   Routine PRN Medications:  Negative  Consultations: The patient was encouraged to keep all PCP and specialty clinic appointments.   Safety Concerns:   Patient told to call clinic if any problems occur. Patient advised to go to  ER  if she should develop SI/HI, side effects, or if symptoms worsen. Has crisis numbers to call if needed.    Other:   8. Patient was instructed to return to clinic in 1 months.  9. The patient was advised to call and cancel their mental health appointment within 24 hours of the appointment, if they are unable to keep the appointment, as well as the three no show and termination from clinic policy. 10. The patient expressed understanding of the plan and agrees with the above.   Jacqulyn Cane, MD 11/5/201410:07 AM

## 2013-03-08 ENCOUNTER — Encounter (HOSPITAL_COMMUNITY): Payer: Self-pay | Admitting: Psychiatry

## 2013-03-18 ENCOUNTER — Ambulatory Visit: Payer: Medicare Other

## 2013-03-21 ENCOUNTER — Telehealth: Payer: Self-pay | Admitting: *Deleted

## 2013-03-21 ENCOUNTER — Ambulatory Visit (HOSPITAL_BASED_OUTPATIENT_CLINIC_OR_DEPARTMENT_OTHER): Payer: Medicare Other

## 2013-03-21 VITALS — BP 115/76 | HR 94 | Temp 98.0°F | Resp 20

## 2013-03-21 DIAGNOSIS — D649 Anemia, unspecified: Secondary | ICD-10-CM

## 2013-03-21 DIAGNOSIS — E538 Deficiency of other specified B group vitamins: Secondary | ICD-10-CM

## 2013-03-21 MED ORDER — CYANOCOBALAMIN 1000 MCG/ML IJ SOLN
1000.0000 ug | INTRAMUSCULAR | Status: DC
  Administered 2013-03-21: 1000 ug via INTRAMUSCULAR

## 2013-03-21 NOTE — Telephone Encounter (Signed)
Pt called to rs her ftka inj from 11/17 til today. gv appt for 03/21/13 2:45pm...td

## 2013-04-09 ENCOUNTER — Encounter (INDEPENDENT_AMBULATORY_CARE_PROVIDER_SITE_OTHER): Payer: Self-pay

## 2013-04-09 ENCOUNTER — Telehealth (HOSPITAL_COMMUNITY): Payer: Self-pay

## 2013-04-09 ENCOUNTER — Encounter (HOSPITAL_COMMUNITY): Payer: Self-pay | Admitting: Psychiatry

## 2013-04-09 ENCOUNTER — Ambulatory Visit (INDEPENDENT_AMBULATORY_CARE_PROVIDER_SITE_OTHER): Payer: Medicare Other | Admitting: Psychiatry

## 2013-04-09 VITALS — BP 102/65 | HR 107 | Ht 66.0 in | Wt 218.0 lb

## 2013-04-09 DIAGNOSIS — F411 Generalized anxiety disorder: Secondary | ICD-10-CM

## 2013-04-09 DIAGNOSIS — F331 Major depressive disorder, recurrent, moderate: Secondary | ICD-10-CM

## 2013-04-09 MED ORDER — VENLAFAXINE HCL ER 37.5 MG PO CP24: ORAL_CAPSULE | ORAL | Status: DC

## 2013-04-09 MED ORDER — VENLAFAXINE HCL ER 150 MG PO CP24: ORAL_CAPSULE | ORAL | Status: DC

## 2013-04-09 NOTE — Progress Notes (Signed)
[facility] Follow-up Outpatient Visit  [name] 1955/06/02  Patient Identification:  Kayla Mayo  Date of Evaluation:  04/09/2013  Chief Complaint:    Chief Complaint  Patient presents with  . Follow-up   History of Chief Complaint:  HPI Comments: Chief Complaint:   SUBJECTIVE: HPI Comments: Kayla Mayo is a 57 year old female with a diagnosis of Generalized Anxiety Disorder and Major Depressive Disorder, recurrent, moderate.      . Location: The patient continues to marital stressors. . Quality: The patient is here with her husband today.  She discusses with him in the presence of the provider her issues with the lack of intimacy an affection in there relationship, and the toll it has had on their marriage.  She reports she is taking her medications as denies any side effects.   In the area of affective symptoms, patient appears anxious . Patient denies current suicidal ideation, intent, or plan. Patient denies current homicidal ideation, intent, or plan. Patient denies auditory hallucinations. Patient denies visual hallucinations. Patient denies symptoms of paranoia. Patient states sleep is still poor, with approximately 6-7 hours of sleep per night, but she does not feel rested. Appetite is improving. Energy level is fair. Patient endorses some decrease of symptoms of anhedonia since going to the pool. Patient denies hopelessness, helplessness, or guilt.   . Severity:  Depression: 4/10 (0=Very depressed; 5=Neutral; 10=Very Happy)  Anxiety- 0/10 (0=no anxiety; 5= moderate/tolerable anxiety; 10= panic attacks)  . Duration: Since the age of 43.  . Timing: no specific timing  . Context: Familial stressors. Son's health. Marital stressors  . Modifying factors: The patient reports any increase in effexor to 262.5 mg   . Associated signs and symptoms: Denies any recent episodes consistent with mania, particularly decreased need for sleep with increased  energy, grandiosity, impulsivity, hyperverbal and pressured speech, or increased productivity. Denies any recent symptoms consistent with psychosis, particularly auditory or visual hallucinations, thought broadcasting/insertion/withdrawal, or ideas of reference. Also denies excessive worry to the point of physical symptoms as well as any panic attacks. Denies any history of trauma or symptoms consistent with PTSD such as flashbacks, nightmares, hypervigilance, feelings of numbness or inability to connect with others.   Review of Systems  Constitutional: Negative for fever, chills, activity change, appetite change and fatigue.  Respiratory: Negative for apnea, cough and wheezing.   Cardiovascular: Negative for leg swelling.  Gastrointestinal: Negative for vomiting, abdominal pain, diarrhea, constipation, blood in stool, abdominal distention and anal bleeding.  Musculoskeletal:       Gait abnormalities due to knee pain. Muscle strength normal.  Neurological: Negative for tremors, seizures, syncope and numbness (In right hand.).   Psychiatric: Agitation: Negative Hallucination: Negative Depressed Mood: Negative Insomnia: Negative Hypersomnia: Yes Altered Concentration: No Feels Worthless: No Grandiose Ideas: Negative Belief In Special Powers: Negative New/Increased Substance Abuse: Negative Compulsions: Negative  Traumatic Brain Injury: Negative  Filed Vitals:   04/09/13 1340  BP: 102/65  Pulse: 107  Height: 5\' 6"  (1.676 m)  Weight: 218 lb (98.884 kg)    Physical Exam  Vitals reviewed. Constitutional: She appears well-developed and well-nourished. No distress.  Skin: She is not diaphoretic.  Musculoskeletal: Strength & Muscle Tone: within normal limits Gait & Station: normal Patient leans: N/A  General Appearance: alert, oriented, no acute distress, obese and cachectic Musculoskeletal: Strength & Muscle Tone: within normal limits Gait & Station: normal Patient leans:  N/A  Psychiatric Specialty Examination: Objective:  Appearance: Casual and Fairly Groomed  Eye Contact::  Fair  Speech:  Clear and Coherent and Normal Rate  Volume:  Normal  Mood:  "down"   Depression: 4/10 (0=Very depressed; 5=Neutral; 10=Very Happy)  Anxiety- 0/10 (0=no anxiety; 5= moderate/tolerable anxiety; 10= panic attacks)   Affect:  Appropriate, Congruent and Full Range  Thought Process:  Coherent, Linear and Logical  Orientation:  Full (Time, Place, and Person)  Thought Content:  WDL  Suicidal Thoughts:  No  Homicidal Thoughts:  No  Judgement:  Fair  Insight:  Fair  Psychomotor Activity:  Normal  Akathisia:  No  Handed:  Right  Memory: 3/3 immediate; 2/3 recent  AIMS (if indicated):  Not indicate  Assets:  Communication Skills Desire for Improvement Financial Resources/Insurance Housing Intimacy Leisure Time Physical Health Resilience Social Support Talents/Skills Transportation    Laboratory/X-Ray Psychological Evaluation(s)   None  None   Past Psychiatric History: Reviewed Diagnosis: Adjustment Disorder with mixed emotions  Hospitalizations: Patient denies.  Outpatient Care: Yes-first in 1991 (first husband left her), and in 2010 (due to marital stressors)  Substance Abuse Care: Patient denies.  Self-Mutilation: Patient denies.  Suicidal Attempts: Patient denies.  Violent Behaviors: none reported  History of Electroconvulsive Shock Therapy: Negative    Past Medical History:  Reviewed Past Medical History  Diagnosis Date  . Anxiety   . Diabetes mellitus   . GERD (gastroesophageal reflux disease)   . Hyperlipidemia   . Hypertension   . Anal fissure   . Arthritis   . Chronic headaches   . Colon polyps   . Depression   . Gallstones   . IBS (irritable bowel syndrome)   . Obesity   . Status post cardiac catheterization   . Anemia 03/17/2011  . Osteopenia 07/2011    t score -1.5 FRAX 2.1%/0%  . Vertigo     Occurs 6 times a month  . Bursitis  of hip    History of Loss of Consciousness:  No Seizure History:  Negative Cardiac History:  Yes Hyperlidiemia and Hypertension Allergies:  Reviewed  Allergies  Allergen Reactions  . Chicken Allergy     Sinus drainage  . Codeine   . Corn-Containing Products   . Other     Statins  . Peanut-Containing Drug Products   . Sesame Oil     Sinus drainage  . Simvastatin     Muscle pain  . Wheat     Sinus drainage   Current Medications: Reviewed Current Outpatient Prescriptions  Medication Sig Dispense Refill  . ALPRAZolam (XANAX) 0.5 MG tablet Take 1 tablet (0.5 mg total) by mouth 2 (two) times daily.  180 tablet  3  . amitriptyline (ELAVIL) 10 MG tablet Take 10 mg by mouth at bedtime.      Marland Kitchen aspirin-acetaminophen-caffeine (EXCEDRIN MIGRAINE) 250-250-65 MG per tablet Take 1 tablet by mouth every 6 (six) hours as needed.      . Azelastine-Fluticasone (DYMISTA NA) Place into the nose.      . clobetasol (TEMOVATE) 0.05 % external solution Use as directed  25 mL  0  . cyanocobalamin (,VITAMIN B-12,) 1000 MCG/ML injection Inject 1,000 mcg into the muscle every 30 (thirty) days.        . enalapril (VASOTEC) 20 MG tablet Take 1 tablet (20 mg total) by mouth daily.  90 tablet  3  . EPIPEN 2-PAK 0.3 MG/0.3ML DEVI as needed.      . folic acid (FOLVITE) 800 MCG tablet Take one and one half tablets by mouth daily  135 tablet  3  . furosemide (LASIX) 20 MG tablet Take 1 tablet (20 mg total) by mouth 2 (two) times daily.  180 tablet  3  . hydrocortisone (PROCTOSOL HC) 2.5 % rectal cream Place 1 application rectally as needed.  30 g  0  . Liraglutide (VICTOZA [place]) Inject into the skin.      Marland Kitchen loratadine (CLARITIN) 10 MG tablet Take 10 mg by mouth daily.      Marland Kitchen lubiprostone (AMITIZA) 24 MCG capsule Take 1 capsule (24 mcg total) by mouth 2 (two) times daily with a meal.  180 capsule  3  . meclizine (ANTIVERT) 25 MG tablet Take 25 mg by mouth 3 (three) times daily as needed.        . metFORMIN  (GLUCOPHAGE) 500 MG tablet Take 500 mg by mouth daily with breakfast.      . mirabegron ER (MYRBETRIQ) 25 MG TB24 Take 25 mg by mouth daily.       . montelukast (SINGULAIR) 10 MG tablet       . NON FORMULARY Allergy injections: not given at our office      . omeprazole (PRILOSEC) 20 MG capsule Take 1 capsule (20 mg total) by mouth 2 (two) times daily.  180 capsule  3  . PATADAY 0.2 % SOLN       . polyethylene glycol powder (GLYCOLAX/MIRALAX) powder Take 17 g by mouth 2 (two) times daily.  255 g  0  . potassium chloride SA (KLOR-CON M20) 20 MEQ tablet Take 1 tablet (20 mEq total) by mouth 2 (two) times daily.  180 tablet  3  . rosuvastatin (CRESTOR) 10 MG tablet Take 5 mg by mouth daily.       . traZODone (DESYREL) 100 MG tablet Take 1.5 tablets (150 mg total) by mouth daily. Take one to one and a half tablets at bedtime.  45 tablet  1  . venlafaxine XR (EFFEXOR-XR) 150 MG 24 hr capsule TAKE ONE CAPSULE BY MOUTH DAILY. Take with 37.5 mg daily.  90 capsule  1  . venlafaxine XR (EFFEXOR-XR) 37.5 MG 24 hr capsule Take with 150 mg. Take one capsule for (37.5 mg) for 14 days, then increase to two capsules daily.  180 capsule  1  . verapamil (CALAN) 80 MG tablet Take 1 tablet (80 mg total) by mouth daily.  90 tablet  3   No current facility-administered medications for this visit.    Suicidal Ideation: Negative Plan Formed: Negative Patient has means to carry out plan: Negative  Homicidal Ideation: Negative Plan Formed: Negative Patient has means to carry out plan: Negative  Neurologic: Headache: Negative Seizure: Negative Paresthesias: Negative  Past Medical Family: Reviewed Family History  Problem Relation Age of Onset  . Diabetes Mother   . Stroke Mother   . Heart disease Mother   . Hypertension Mother   . Hyperlipidemia Mother   . Anxiety disorder Mother   . Diabetes Brother   . Alcohol abuse Brother   . Drug abuse Brother   . Diabetes Maternal Aunt   . Diabetes Maternal Uncle    . Alcohol abuse Maternal Uncle   . Diabetes Maternal Grandmother   . Dementia Maternal Grandmother   . Depression Maternal Grandmother   . Alcohol abuse Father   . Alcohol abuse Paternal Uncle   . Alcohol abuse Maternal Grandfather   . Alcohol abuse Paternal Grandfather   . Alcohol abuse Maternal Uncle   . Alcohol abuse Paternal Uncle   . Alcohol abuse Paternal  Uncle     Social History: Reviewed Current Place of Residence: [place], [place] Place of Birth:[place], [place] Family Members: Lives with husband of 13 year Marital Status:  Married Children: 2  Sons: 1  Daughters: 1 Relationships: Daughter is her main source of emotional support and her friend of 15 years. Education:  [employer] Problems/Performance: None Religious Beliefs/Practices: Printmaker. History of Abuse: none Occupational Experiences: Has been disabled since 2008. 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 Military History:  None. Legal History: Patient denies. Hobbies/Interests: Patient reports   Assessment:  AXIS I Generalized Anxiety Disorder and Major Depressive Disorder, recurrent, moderate  Treatment Plan/Recommendations:  Plan of Care:   1. Affirm with the patient that the medications are taken as ordered. Patient  expressed understanding of how their medications were to be used.    Laboratory: No labs warranted at this time.   Psychotherapy: Therapy: brief supportive therapy provided. Discussed psychosocial stressors in detail. Focused on marital stressors. Advised marital counseling.  Medications:  Continue the following psychiatric medications as written prior to this appointment:  a) Increase  venlafaxine 150 mg + 112.5 total 262.5 mg b) Alprazolam-prescribed by PCP as needed for panic attacks only. Patient has been using this for sleep.  C) Trazodone 100 mg-Take one-half one tablet at bedtime.  -Risks and benefits, side effects and  alternatives discussed with patient, she was given an opportunity to ask questions about his/her medication, illness, and treatment. All current psychiatric medications have been reviewed and discussed with the patient and adjusted as clinically appropriate. The patient has been provided an accurate and updated list of the medications being now prescribed.   Routine PRN Medications:  None  Consultations: The patient was encouraged to keep all PCP and specialty clinic appointments.   Safety Concerns:   Patient told to call clinic if any problems occur. Patient advised to go to  ER  if she should develop SI/HI, side effects, or if symptoms worsen. Has crisis numbers to call if needed.    Other:   8. Patient was instructed to return to clinic in 1 months.  9. The patient was advised to call and cancel their mental health appointment within 24 hours of the appointment, if they are unable to keep the appointment, as well as the three no show and termination from clinic policy. 10. The patient expressed understanding of the plan and agrees with the above.  Times spent: 30 minutes Sheyna Pettibone, MD 12/9/20141:35 PM

## 2013-04-11 MED ORDER — VENLAFAXINE HCL ER 37.5 MG PO CP24: ORAL_CAPSULE | ORAL | Status: DC

## 2013-04-11 NOTE — Telephone Encounter (Signed)
Called pharmacy and patient to clarify directions.

## 2013-04-15 ENCOUNTER — Ambulatory Visit (HOSPITAL_BASED_OUTPATIENT_CLINIC_OR_DEPARTMENT_OTHER): Payer: Medicare Other

## 2013-04-15 ENCOUNTER — Encounter (HOSPITAL_COMMUNITY): Payer: Self-pay | Admitting: Psychiatry

## 2013-04-15 VITALS — BP 109/68 | HR 91 | Temp 98.8°F

## 2013-04-15 DIAGNOSIS — D649 Anemia, unspecified: Secondary | ICD-10-CM

## 2013-04-15 DIAGNOSIS — E538 Deficiency of other specified B group vitamins: Secondary | ICD-10-CM

## 2013-04-15 MED ORDER — CYANOCOBALAMIN 1000 MCG/ML IJ SOLN
1000.0000 ug | INTRAMUSCULAR | Status: DC
  Administered 2013-04-15: 1000 ug via INTRAMUSCULAR

## 2013-05-01 ENCOUNTER — Other Ambulatory Visit (HOSPITAL_COMMUNITY): Payer: Self-pay | Admitting: Psychiatry

## 2013-05-01 ENCOUNTER — Telehealth: Payer: Self-pay | Admitting: Oncology

## 2013-05-01 NOTE — Telephone Encounter (Signed)
Refilled trazodone.

## 2013-05-13 ENCOUNTER — Ambulatory Visit (HOSPITAL_BASED_OUTPATIENT_CLINIC_OR_DEPARTMENT_OTHER): Payer: Medicare HMO

## 2013-05-13 VITALS — BP 124/71 | HR 105 | Temp 98.6°F

## 2013-05-13 DIAGNOSIS — D649 Anemia, unspecified: Secondary | ICD-10-CM

## 2013-05-13 DIAGNOSIS — E538 Deficiency of other specified B group vitamins: Secondary | ICD-10-CM

## 2013-05-13 MED ORDER — CYANOCOBALAMIN 1000 MCG/ML IJ SOLN
1000.0000 ug | INTRAMUSCULAR | Status: DC
  Administered 2013-05-13: 1000 ug via INTRAMUSCULAR

## 2013-05-13 NOTE — Patient Instructions (Signed)
Vitamin B12 Injections Every person needs vitamin B12. A deficiency develops when the body does not get enough of it. One way to overcome this is by getting B12 shots (injections). A B12 shot puts the vitamin directly into muscle tissue. This avoids any problems your body might have in absorbing it from food or a pill. In some people, the body has trouble using the vitamin correctly. This can cause a B12 deficiency. Not consuming enough of the vitamin can also cause a deficiency. Getting enough vitamin B12 can be hard for elderly people. Sometimes, they do not eat a well-balanced diet. The elderly are also more likely than younger people to have medical conditions or take medications that can lead to a deficiency. WHAT DOES VITAMIN B12 DO? Vitamin B12 does many things to help the body work right:  It helps the body make healthy red blood cells.  It helps maintain nerve cells.  It is involved in the body's process of converting food into energy (metabolism).  It is needed to make the genetic material in all cells (DNA). VITAMIN B12 FOOD SOURCES Most people get plenty of vitamin B12 through the foods they eat. It is present in:  Meat, fish, poultry, and eggs.  Milk and milk products.  It also is added when certain foods are made, including some breads, cereals and yogurts. The food is then called "fortified". CAUSES The most common causes of vitamin B12 deficiency are:  Pernicious anemia. The condition develops when the body cannot make enough healthy red blood cells. This stems from a lack of a protein made in the stomach (intrinsic factor). People without this protein cannot absorb enough vitamin B12 from food.  Malabsorption. This is when the body cannot absorb the vitamin. It can be caused by:  Pernicious anemia.  Surgery to remove part or all of the stomach can lead to malabsorption. Removal of part or all of the small intestine can also cause malabsorption.  Vegetarian diet.  People who are strict about not eating foods from animals could have trouble taking in enough vitamin B12 from diet alone.  Medications. Some medicines have been linked to B12 deficiency, such as Metformin (a drug prescribed for type 2 diabetes). Long-term use of stomach acid suppressants also can keep the vitamin from being absorbed.  Intestinal problems such as inflammatory bowel disease. If there are problems in the digestive tract, vitamin B12 may not be absorbed in good enough amounts. SYMPTOMS People who do not get enough B12 can develop problems. These can include:  Anemia. This is when the body has too few red blood cells. Red blood cells carry oxygen to the rest of the body. Without a healthy supply of red blood cells, people can feel:  Tired (fatigued).  Weak.  Severe anemia can cause:  Shortness of breath.  Dizziness.  Rapid heart rate.  Paleness.  Other Vitamin B12 deficiency symptoms include:  Diarrhea.  Numbness or tingling in the hands or feet.  Loss of appetite.  Confusion.  Sores on the tongue or in the mouth. LET YOUR CAREGIVER KNOW ABOUT:  Any allergies. It is very important to know if you are allergic or sensitive to cobalt. Vitamin B12 contains cobalt.  Any history of kidney disease.  All medications you are taking. Include prescription and over-the-counter medicines, herbs and creams.  Whether you are pregnant or breast-feeding.  If you have Leber's disease, a hereditary eye condition, vitamin B12 could make it worse. RISKS AND COMPLICATIONS Reactions to an injection are   usually temporary. They might include:  Pain at the injection site.  Redness, swelling or tenderness at the site.  Headache, dizziness or weakness.  Nausea, upset stomach or diarrhea.  Numbness or tingling.  Fever.  Joint pain.  Itching or rash. If a reaction does not go away in a short while, talk with your healthcare provider. A change in the way the shots are  given, or where they are given, might need to be made. BEFORE AN INJECTION To decide whether B12 injections are right for you, your healthcare provider will probably:  Ask about your medical history.  Ask questions about your diet.  Ask about symptoms such as:  Have you felt weak?  Do you feel unusually tired?  Do you get dizzy?  Order blood tests. These may include a test to:  Check the level of red cells in your blood.  Measure B12 levels.  Check for the presence of intrinsic factor. VITAMIN B12 INJECTIONS How often you will need a vitamin B12 injection will depend on how severe your deficiency is. This also will affect how long you will need to get them. People with pernicious anemia usually get injections for their entire life. Others might get them for a shorter period. For many people, injections are given daily or weekly for several weeks. Then, once B12 levels are normal, injections are given just once a month. If the cause of the deficiency can be fixed, the injections can be stopped. Talk with your healthcare provider about what you should expect. For an injection:  The injection site will be cleaned with an alcohol swab.  Your healthcare provider will insert a needle directly into a muscle. Most any muscle can be used. Most often, an arm muscle is used. A buttocks muscle can also be used. Many people say shots in that area are less painful.  A small adhesive bandage may be put over the injection site. It usually can be taken off in an hour or less. Injections can be given by your healthcare provider. In some cases, family members give them. Sometimes, people give them to themselves. Talk with your healthcare provider about what would be best for you. If someone other than your healthcare provider will be giving the shots, the person will need to be trained to give them correctly. HOME CARE INSTRUCTIONS   You can remove the adhesive bandage within an hour of getting a  shot.  You should be able to go about your normal activities right away.  Avoid drinking large amounts of alcohol while taking vitamin B12 shots. Alcohol can interfere with the body's use of the vitamin. SEEK MEDICAL CARE IF:   Pain, redness, swelling or tenderness at the injection site does not get better or gets worse.  Headache, dizziness or weakness does not go away.  You develop a fever of more than 100.5 F (38.1 C). SEEK IMMEDIATE MEDICAL CARE IF:   You have chest pain.  You develop shortness of breath.  You have muscle weakness that gets worse.  You develop numbness, weakness or tingling on one side or one area of the body.  You have symptoms of an allergic reaction, such as:  Hives.  Difficulty breathing.  Swelling of the lips, face, tongue or throat.  You develop a fever of more than 102.0 F (38.9 C). MAKE SURE YOU:   Understand these instructions.  Will watch your condition.  Will get help right away if you are not doing well or get worse. Document   Released: 07/15/2008 Document Revised: 07/11/2011 Document Reviewed: 07/15/2008 Exit[employer] Patient Information 2014 [employer].  

## 2013-05-20 ENCOUNTER — Encounter: Payer: Medicare Other | Admitting: Gynecology

## 2013-05-20 ENCOUNTER — Encounter: Payer: Self-pay | Admitting: Gynecology

## 2013-05-20 ENCOUNTER — Ambulatory Visit (INDEPENDENT_AMBULATORY_CARE_PROVIDER_SITE_OTHER): Payer: Medicare HMO | Admitting: Gynecology

## 2013-05-20 VITALS — BP 120/70 | Ht 66.0 in | Wt 224.0 lb

## 2013-05-20 DIAGNOSIS — N952 Postmenopausal atrophic vaginitis: Secondary | ICD-10-CM

## 2013-05-20 DIAGNOSIS — M899 Disorder of bone, unspecified: Secondary | ICD-10-CM

## 2013-05-20 DIAGNOSIS — M8580 Other specified disorders of bone density and structure, unspecified site: Secondary | ICD-10-CM

## 2013-05-20 DIAGNOSIS — Z01419 Encounter for gynecological examination (general) (routine) without abnormal findings: Secondary | ICD-10-CM

## 2013-05-20 DIAGNOSIS — M949 Disorder of cartilage, unspecified: Secondary | ICD-10-CM

## 2013-05-20 NOTE — Progress Notes (Signed)
Kayla Mayo [facility] 1955-11-04 [id number]        58 y.o.  G2P2 for followup exam.  Several issues that are below.  Past medical history,surgical history, problem list, medications, allergies, family history and social history were all reviewed and documented in the EPIC chart.  ROS:  Performed and pertinent positives and negatives are included in the history, assessment and plan .  Exam: [name] assistant Filed Vitals:   05/20/13 1018  BP: 120/70  Height: 5\' 6"  (1.676 m)  Weight: 224 lb (101.606 kg)   General appearance  Normal Skin grossly normal Head/Neck normal with no cervical or supraclavicular adenopathy thyroid normal Lungs  clear Cardiac RR, without RMG Abdominal  soft, nontender, without masses, organomegaly or hernia Breasts  examined lying and sitting without masses, retractions, discharge or axillary adenopathy. Pelvic  Ext/BUS/vagina  Normal with generalized atrophic changes  Cervix  Normal with atrophic changes  Uterus  anteverted, normal size, shape and contour, midline and mobile nontender   Adnexa  Without masses or tenderness    Anus and perineum  Normal   Rectovaginal  Normal sphincter tone without palpated masses or tenderness.    Assessment/Plan:  57 y.o. G2P2 female for annual exam.   1. Postmenopausal/atrophic genital changes. Had been on HRT but discontinued and has done well without significant hot flashes, night sweats, vaginal dryness. Is not sexually active. No vaginal bleeding. Will continue to monitor. Call if any vaginal bleeding. 2. Osteopenia. DEXA 07/2011 T score -1.5 distal third forearm. Remainder of her DEXA normal. Stable from prior DEXA. FRAX 2.1%/ 0%. Plan repeat DEXA in several years. Calcium/vitamin D recommendations reviewed. 3. Pap smear 05/2011. No Pap smear done today. No history of abnormal Pap smears previously. Plan repeat Pap smear next year 3 year interval. 4. Mammography 04/2013. Continue with annual mammography. SBE monthly  reviewed. 5. Colonoscopy 2009 due this year and she knows to call and schedule. 6. Health maintenance. No blood work done and she reports this is all done through her other physician's offices. Followup one year, sooner as needed.   Note: This document was prepared with digital dictation and possible smart phrase technology. Any transcriptional errors that result from this process are unintentional.   Anastasio Auerbach MD, 10:43 AM 05/20/2013

## 2013-05-20 NOTE — Patient Instructions (Signed)
Follow up in one year, sooner as needed. 

## 2013-05-21 ENCOUNTER — Ambulatory Visit (INDEPENDENT_AMBULATORY_CARE_PROVIDER_SITE_OTHER): Payer: Medicare HMO | Admitting: Psychiatry

## 2013-05-21 ENCOUNTER — Encounter (HOSPITAL_COMMUNITY): Payer: Self-pay | Admitting: Psychiatry

## 2013-05-21 ENCOUNTER — Encounter (INDEPENDENT_AMBULATORY_CARE_PROVIDER_SITE_OTHER): Payer: Self-pay

## 2013-05-21 VITALS — BP 122/76 | HR 89 | Wt 224.0 lb

## 2013-05-21 DIAGNOSIS — F331 Major depressive disorder, recurrent, moderate: Secondary | ICD-10-CM

## 2013-05-21 DIAGNOSIS — F411 Generalized anxiety disorder: Secondary | ICD-10-CM

## 2013-05-21 LAB — URINALYSIS W MICROSCOPIC + REFLEX CULTURE
Bacteria, UA: NONE SEEN
CRYSTALS: NONE SEEN
HGB URINE DIPSTICK: NEGATIVE
Ketones, ur: NEGATIVE mg/dL
Leukocytes, UA: NEGATIVE
NITRITE: NEGATIVE
PH: 5.5 (ref 5.0–8.0)
Protein, ur: NEGATIVE mg/dL
SPECIFIC GRAVITY, URINE: 1.026 (ref 1.005–1.030)
Urobilinogen, UA: 1 mg/dL (ref 0.0–1.0)

## 2013-05-21 MED ORDER — VENLAFAXINE HCL ER 37.5 MG PO CP24: 112.5000 mg | ORAL_CAPSULE | Freq: Every day | ORAL | Status: DC

## 2013-05-21 NOTE — Progress Notes (Signed)
[place] 02-29-1956  Patient Identification:  Kayla Mayo  Date of Evaluation:  05/21/2013  Chief Complaint:    Chief Complaint  Patient presents with  . Follow-up   History of Chief Complaint:  HPI Comments: Chief Complaint:   SUBJECTIVE: HPI Comments: Kayla Mayo is a 58 year old female with a diagnosis of Generalized Anxiety Disorder and Major Depressive Disorder, recurrent, moderate.      . Location: The patient reports some improvement with her marriage.  . Quality: The patient is here with her husband today.  She reports some improvement in the level of intimacy and communication with her husband. She states that her husband is making more of an attempt to improve their relationship and this has contributed to an improvement in their relationship.  She reports she was not able to increase effexor due to some issues with her prescription. She reports she is taking her medications as denies any side effects.   In the area of affective symptoms, patient appears anxious . Patient denies current suicidal ideation, intent, or plan. Patient denies current homicidal ideation, intent, or plan. Patient denies auditory hallucinations. Patient denies visual hallucinations. Patient denies symptoms of paranoia. Patient states sleep is still poor, with approximately 7 hours of sleep per night, but she does not feel rested. Appetite is good. Energy level is fair. Patient endorses some  symptoms of anhedonia. Patient denies hopelessness, helplessness, or guilt.   . Severity:  Depression: 4/10 (0=Very depressed; 5=Neutral; 10=Very Happy)  Anxiety- 10/10 (0=no anxiety; 5= moderate/tolerable anxiety; 10= panic attacks)  . Duration: Since the age of 42.  . Timing: no specific timing  . Context: Familial stressors. Son's current job.  . Modifying factors: Improves  . Associated signs and symptoms: Denies any recent episodes  consistent with mania, particularly decreased need for sleep with increased energy, grandiosity, impulsivity, hyperverbal and pressured speech, or increased productivity. Denies any recent symptoms consistent with psychosis, particularly auditory or visual hallucinations, thought broadcasting/insertion/withdrawal, or ideas of reference. Also denies excessive worry to the point of physical symptoms as well as any panic attacks. Denies any history of trauma or symptoms consistent with PTSD such as flashbacks, nightmares, hypervigilance, feelings of numbness or inability to connect with others.   Review of Systems  Constitutional: Negative for fever, chills, activity change, appetite change and fatigue.  Eyes: Negative for photophobia.  Respiratory: Negative for apnea, cough, chest tightness and wheezing.   Cardiovascular: Negative for chest pain, palpitations and leg swelling.  Gastrointestinal: Negative for vomiting, abdominal pain, diarrhea, constipation, blood in stool, abdominal distention and anal bleeding.  Genitourinary: Negative for dysuria and dyspareunia.  Musculoskeletal: Positive for arthralgias and back pain.       Gait abnormalities due to knee pain. Muscle strength normal.  Neurological: Negative for dizziness, tremors, seizures, syncope and numbness (In right hand.).   Psychiatric: Agitation: Negative Hallucination: Negative Depressed Mood: Negative Insomnia: Negative Hypersomnia: Yes Altered Concentration: No Feels Worthless: No Grandiose Ideas: Negative Belief In Special Powers: Negative New/Increased Substance Abuse: Negative Compulsions: Negative  Traumatic Brain Injury: Negative  Filed Vitals:   05/21/13 1340  BP: 122/76  Pulse: 89  Weight: 224 lb (101.606 kg)    Physical Exam  Vitals reviewed. Constitutional: She appears well-developed and well-nourished. No distress.  Skin: She is not diaphoretic.  Musculoskeletal: Gait & Station: normal Patient leans:  N/A  General Appearance: alert, oriented, no acute distress, obese and cachectic Musculoskeletal: Strength & Muscle  Tone: within normal limits Gait & Station: normal Patient leans: N/A  Psychiatric Specialty Examination: Objective:  Appearance: Casual and Fairly Groomed  Engineer, water::  Fair  Speech:  Clear and Coherent and Normal Rate  Volume:  Normal  Mood:  "I get tired"  Affect:  Appropriate, Congruent and Full Range  Thought Process:  Coherent, Linear and Logical  Orientation:  Full (Time, Place, and Person)  Thought Content:  WDL  Suicidal Thoughts:  No  Homicidal Thoughts:  No  Judgement:  Fair  Insight:  Fair  Psychomotor Activity:  Normal  Akathisia:  No  Handed:  Right  Memory: 3/3 immediate; 3/3 recent  AIMS (if indicated):  Not indicated  Assets:  Communication Skills Desire for Improvement Financial Resources/Insurance Housing Intimacy Leisure Time Physical Health Resilience Social Support Talents/Skills Transportation    Laboratory/X-Ray Psychological Evaluation(s)   None  None   Past Psychiatric History: Reviewed Diagnosis: Adjustment Disorder with mixed emotions  Hospitalizations: Patient denies.  Outpatient Care: Yes-first in 1991 (first husband left her), and in 2010 (due to marital stressors)  Substance Abuse Care: Patient denies.  Self-Mutilation: Patient denies.  Suicidal Attempts: Patient denies.  Violent Behaviors: none reported  History of Electroconvulsive Shock Therapy: Negative    Past Medical History:  Reviewed Past Medical History  Diagnosis Date  . Anxiety   . Diabetes mellitus   . GERD (gastroesophageal reflux disease)   . Hyperlipidemia   . Hypertension   . Anal fissure   . Arthritis   . Chronic headaches   . Colon polyps   . Depression   . Gallstones   . IBS (irritable bowel syndrome)   . Obesity   . Status post cardiac catheterization   . Anemia 03/17/2011  . Osteopenia 07/2011    t score -1.5 FRAX 2.1%/0%  .  Vertigo     Occurs 6 times a month  . Bursitis of hip   . Esophageal spasm   . Neuropathy    History of Loss of Consciousness:  No Seizure History:  Negative Cardiac History:  Yes Hyperlidiemia and Hypertension Allergies:  Reviewed  Allergies  Allergen Reactions  . Chicken Allergy     Sinus drainage  . Codeine   . Corn-Containing Products   . Other     Statins  . Peanut-Containing Drug Products   . Sesame Oil     Sinus drainage  . Simvastatin     Muscle pain  . Wheat     Sinus drainage   Current Medications: Reviewed Current Outpatient Prescriptions  Medication Sig Dispense Refill  . ALPRAZolam (XANAX) 0.5 MG tablet Take 1 tablet (0.5 mg total) by mouth 2 (two) times daily.  180 tablet  3  . amitriptyline (ELAVIL) 10 MG tablet Take 10 mg by mouth at bedtime.      Marland Kitchen aspirin-acetaminophen-caffeine (EXCEDRIN MIGRAINE) 250-250-65 MG per tablet Take 1 tablet by mouth every 6 (six) hours as needed.      . Azelastine-Fluticasone (DYMISTA NA) Place into the nose.      . clobetasol (TEMOVATE) 0.05 % external solution Use as directed  25 mL  0  . cyanocobalamin (,VITAMIN B-12,) 1000 MCG/ML injection Inject 1,000 mcg into the muscle every 30 (thirty) days.        . enalapril (VASOTEC) 20 MG tablet Take 1 tablet (20 mg total) by mouth daily.  90 tablet  3  . EPIPEN 2-PAK 0.3 MG/0.3ML DEVI as needed.      . furosemide (LASIX) 20 MG tablet  Take 1 tablet (20 mg total) by mouth 2 (two) times daily.  180 tablet  3  . hydrocortisone (PROCTOSOL HC) 2.5 % rectal cream Place 1 application rectally as needed.  30 g  0  . Liraglutide (VICTOZA [place]) Inject into the skin.      Marland Kitchen loratadine (CLARITIN) 10 MG tablet Take 10 mg by mouth daily.      . meclizine (ANTIVERT) 25 MG tablet Take 25 mg by mouth 3 (three) times daily as needed.        . metFORMIN (GLUCOPHAGE) 500 MG tablet Take 500 mg by mouth daily with breakfast.      . mirabegron ER (MYRBETRIQ) 25 MG TB24 Take 25 mg by mouth daily.       .  montelukast (SINGULAIR) 10 MG tablet       . NON FORMULARY Allergy injections: not given at our office      . omeprazole (PRILOSEC) 20 MG capsule Take 1 capsule (20 mg total) by mouth 2 (two) times daily.  180 capsule  3  . pantoprazole (PROTONIX) 40 MG tablet Take 40 mg by mouth daily.      Marland Kitchen PATADAY 0.2 % SOLN       . polyethylene glycol powder (GLYCOLAX/MIRALAX) powder Take 17 g by mouth 2 (two) times daily.  255 g  0  . rosuvastatin (CRESTOR) 10 MG tablet Take 5 mg by mouth daily.       . traZODone (DESYREL) 100 MG tablet TAKE 1/2 TO 1& 1/2 TABLETS BY MOUTH AT BEDTIME  135 tablet  0  . venlafaxine XR (EFFEXOR-XR) 150 MG 24 hr capsule TAKE ONE CAPSULE BY MOUTH DAILY. Take with 37.5 mg daily.  90 capsule  1  . venlafaxine XR (EFFEXOR-XR) 37.5 MG 24 hr capsule Take with 150 mg. Take three capsules -(total 112.5 mg.)  90 capsule  1  . verapamil (CALAN) 80 MG tablet Take 1 tablet (80 mg total) by mouth daily.  90 tablet  3   No current facility-administered medications for this visit.    Suicidal Ideation: Negative Plan Formed: Negative Patient has means to carry out plan: Negative  Homicidal Ideation: Negative Plan Formed: Negative Patient has means to carry out plan: Negative  Neurologic: Headache: Negative Seizure: Negative Paresthesias: Negative  Past Medical Family: Reviewed Family History  Problem Relation Age of Onset  . Diabetes Mother   . Stroke Mother   . Heart disease Mother   . Hypertension Mother   . Hyperlipidemia Mother   . Anxiety disorder Mother   . Diabetes Brother   . Alcohol abuse Brother   . Drug abuse Brother   . Diabetes Maternal Aunt   . Diabetes Maternal Uncle   . Alcohol abuse Maternal Uncle   . Diabetes Maternal Grandmother   . Dementia Maternal Grandmother   . Depression Maternal Grandmother   . Alcohol abuse Father   . Alcohol abuse Paternal Uncle   . Alcohol abuse Maternal Grandfather   . Alcohol abuse Paternal Grandfather   . Alcohol abuse  Maternal Uncle   . Alcohol abuse Paternal Uncle   . Alcohol abuse Paternal Uncle     Social History: Reviewed Current Place of Residence: [place], [place] of Birth:[place], [place] Family Members: Lives with husband of 42 year Marital Status:  Married Children: 2  Sons: 1  Daughters: 1 Relationships: Daughter is her main source of emotional support and her friend of 32 years. Education:  [employer] Problems/Performance: None Religious Beliefs/Practices: Haematologist.  History of Abuse: none Occupational Experiences: Has been disabled since 2008. 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 Military History:  None. Legal History: Patient denies. Hobbies/Interests: Patient reports   Assessment: Generalized Anxiety Disorder-Mild improvement Major Depressive Disorder, recurrent, moderate-stable AXIS I Generalized Anxiety Disorder and Major Depressive Disorder, recurrent, moderate  Treatment Plan/Recommendations:  Plan of Care:   1. Affirm with the patient that the medications are taken as ordered. Patient  expressed understanding of how their medications were to be used.    Laboratory: No labs warranted at this time.   Psychotherapy: Therapy: brief supportive therapy provided. Discussed psychosocial stressors in detail. Focused on marital stressors. Advised marital counseling.  More than 50% of the visit was spent on individual therapy.-  Medications:  Continue the following psychiatric medications as written prior to this appointment:  a) Increase  venlafaxine 150 mg + 112.5 total 262.5 mg-patient was not able to increase at her last visit. b) Alprazolam-prescribed by PCP as needed for panic attacks only. Patient has been using this for sleep.  C) Trazodone 100 mg-Take one-half one tablet at bedtime.  -Risks and benefits, side effects and alternatives discussed with patient, she was given an opportunity to ask questions about  his/her medication, illness, and treatment. All current psychiatric medications have been reviewed and discussed with the patient and adjusted as clinically appropriate. The patient has been provided an accurate and updated list of the medications being now prescribed.   Routine PRN Medications:  None  Consultations: The patient was encouraged to keep all PCP and specialty clinic appointments.   Safety Concerns:   Patient told to call clinic if any problems occur. Patient advised to go to  ER  if she should develop SI/HI, side effects, or if symptoms worsen. Has crisis numbers to call if needed.    Other:   8. Patient was instructed to return to clinic in 1 months.  9. The patient was advised to call and cancel their mental health appointment within 24 hours of the appointment, if they are unable to keep the appointment, as well as the three no show and termination from clinic policy. 10. The patient expressed understanding of the plan and agrees with the above.  Times spent: 30 minutes Oluwatoyin Banales, Amedeo Gory, MD 1/20/20151:37 PM

## 2013-06-10 ENCOUNTER — Ambulatory Visit (HOSPITAL_BASED_OUTPATIENT_CLINIC_OR_DEPARTMENT_OTHER): Payer: Medicare HMO

## 2013-06-10 VITALS — BP 116/78 | HR 112 | Temp 99.3°F

## 2013-06-10 DIAGNOSIS — D649 Anemia, unspecified: Secondary | ICD-10-CM

## 2013-06-10 DIAGNOSIS — E538 Deficiency of other specified B group vitamins: Secondary | ICD-10-CM

## 2013-06-10 MED ORDER — CYANOCOBALAMIN 1000 MCG/ML IJ SOLN
1000.0000 ug | INTRAMUSCULAR | Status: DC
  Administered 2013-06-10: 1000 ug via INTRAMUSCULAR

## 2013-06-26 ENCOUNTER — Encounter (HOSPITAL_COMMUNITY): Payer: Self-pay | Admitting: Psychiatry

## 2013-06-26 ENCOUNTER — Ambulatory Visit (INDEPENDENT_AMBULATORY_CARE_PROVIDER_SITE_OTHER): Payer: Medicare HMO | Admitting: Psychiatry

## 2013-06-26 VITALS — BP 117/75 | HR 100 | Wt 228.0 lb

## 2013-06-26 DIAGNOSIS — F331 Major depressive disorder, recurrent, moderate: Secondary | ICD-10-CM

## 2013-06-26 DIAGNOSIS — F411 Generalized anxiety disorder: Secondary | ICD-10-CM

## 2013-06-26 MED ORDER — VENLAFAXINE HCL ER 75 MG PO CP24: 75.0000 mg | ORAL_CAPSULE | Freq: Every day | ORAL | Status: DC

## 2013-06-26 NOTE — Progress Notes (Signed)
[place] 1956-03-29  Patient Identification:  Kayla Mayo  Date of Evaluation:  06/26/2013  Chief Complaint:  Chief Complaint  Patient presents with  . Follow-up   History of Chief Complaint:  HPI Comments: Chief Complaint:   SUBJECTIVE: HPI Comments: Ms. Kayla Mayo is a 57 year old female with a diagnosis of Generalized Anxiety Disorder and Major Depressive Disorder, recurrent, moderate.      . Location: The patient reports some improvement with her marriage. She states they had some arguments recently regarding her talking about her marital issues with her daughter and her fiance's words of advice.   . Quality: The patient is here with her husband today.  She reports some continued improvement in the level of intimacy and communication with her husband. She states that her husband is making continued attempts to improve their relationship and this has contributed to an improvement in their relationship.  She reports she is taking her medications as denies any side effects.   In the area of affective symptoms, patient appears mildly irritable. Patient denies current suicidal ideation, intent, or plan. Patient denies current homicidal ideation, intent, or plan. Patient denies auditory hallucinations. Patient denies visual hallucinations. Patient denies symptoms of paranoia. Patient states sleep is still nonrestorative, despite approximately 7 hours of sleep per night, but she does not feel rested. Appetite is good. Energy level is fair. Patient endorses some  symptoms of anhedonia. Patient denies hopelessness, helplessness, or guilt.   . Severity:  Depression: 3/10 (0=Very depressed; 5=Neutral; 10=Very Happy)  Anxiety- 5/10 (0=no anxiety; 5= moderate/tolerable anxiety; 10= panic attacks)  . Duration: Since the age of 31.  . Timing: no specific timing  . Context: Familial stressors. Marital stressors.  . Modifying  factors: Improves with   . Associated signs and symptoms: Denies any recent episodes consistent with mania, particularly decreased need for sleep with increased energy, grandiosity, impulsivity, hyperverbal and pressured speech, or increased productivity. Denies any recent symptoms consistent with psychosis, particularly auditory or visual hallucinations, thought broadcasting/insertion/withdrawal, or ideas of reference. Also denies excessive worry to the point of physical symptoms as well as any panic attacks. Denies any history of trauma or symptoms consistent with PTSD such as flashbacks, nightmares, hypervigilance, feelings of numbness or inability to connect with others.   Review of Systems  Constitutional: Negative for fever, chills, activity change, appetite change and fatigue.  Eyes: Negative for photophobia.  Respiratory: Negative for apnea, cough, chest tightness and wheezing.   Cardiovascular: Negative for chest pain, palpitations and leg swelling.  Gastrointestinal: Negative for vomiting, abdominal pain, diarrhea, constipation, blood in stool, abdominal distention and anal bleeding.  Genitourinary: Negative for dysuria and dyspareunia.  Musculoskeletal: Positive for arthralgias and back pain.       Gait abnormalities due to knee pain. Muscle strength normal.  Neurological: Negative for dizziness, tremors, seizures, syncope and numbness (In right hand.).   Psychiatric: Agitation: Negative Hallucination: Negative Depressed Mood: Negative Insomnia: Negative Hypersomnia: No Altered Concentration: No Feels Worthless: No Grandiose Ideas: Negative Belief In Special Powers: Negative New/Increased Substance Abuse: Negative Compulsions: Negative Traumatic Brain Injury: Negative  Filed Vitals:   06/26/13 1100  BP: 117/75  Pulse: 100  Weight: 228 lb (103.42 kg)   Physical Exam  Vitals reviewed. Constitutional: She appears well-developed and well-nourished. No distress.  Skin:  She is not diaphoretic.  Musculoskeletal: Gait & Station: normal Patient leans: N/A   Psychiatric Specialty Examination: Objective:  Appearance: Casual and Fairly Groomed  Eye Contact::  Fair  Speech:  Clear and Coherent and Normal Rate  Volume:  Normal  Mood:  "not too good"  Affect:  Appropriate, Congruent and Full Range  Thought Process:  Coherent, Linear and Logical  Orientation:  Full (Time, Place, and Person)  Thought Content:  WDL  Suicidal Thoughts:  No  Homicidal Thoughts:  No  Judgement:  Fair  Insight:  Fair  Psychomotor Activity:  Normal  Akathisia:  No  Handed:  Right  Memory: 3/3 immediate; 3/3 recent  [place] of Knowledge good  AIMS (if indicated):  Not indicated  Assets:  Communication Skills Desire for Improvement Financial Resources/Insurance Housing Intimacy Leisure Time Physical Health Resilience Social Support Talents/Skills Transportation    Laboratory/X-Ray Psychological Evaluation(s)   None  None   Past Psychiatric History: Reviewed Diagnosis: Adjustment Disorder with mixed emotions  Hospitalizations: Patient denies.  Outpatient Care: Yes-first in 1991 (first husband left her), and in 2010 (due to marital stressors)  Substance Abuse Care: Patient denies.  Self-Mutilation: Patient denies.  Suicidal Attempts: Patient denies.  Violent Behaviors: none reported  History of Electroconvulsive Shock Therapy: Negative    Past Medical History:  Reviewed Past Medical History  Diagnosis Date  . Anxiety   . Diabetes mellitus   . GERD (gastroesophageal reflux disease)   . Hyperlipidemia   . Hypertension   . Anal fissure   . Arthritis   . Chronic headaches   . Colon polyps   . Depression   . Gallstones   . IBS (irritable bowel syndrome)   . Obesity   . Status post cardiac catheterization   . Anemia 03/17/2011  . Osteopenia 07/2011    t score -1.5 FRAX 2.1%/0%  . Vertigo     Occurs 6 times a month  . Bursitis of hip    . Esophageal spasm   . Neuropathy    History of Loss of Consciousness:  No Seizure History:  Negative Cardiac History:  Yes Hyperlidiemia and Hypertension Allergies:  Reviewed  Allergies  Allergen Reactions  . Chicken Allergy     Sinus drainage  . Codeine   . Corn-Containing Products   . Other     Statins  . Peanut-Containing Drug Products   . Sesame Oil     Sinus drainage  . Simvastatin     Muscle pain  . Wheat     Sinus drainage   Current Medications: Reviewed Current Outpatient Prescriptions  Medication Sig Dispense Refill  . ALPRAZolam (XANAX) 0.5 MG tablet Take 1 tablet (0.5 mg total) by mouth 2 (two) times daily.  180 tablet  3  . AMITIZA 24 MCG capsule Take 1 capsule by mouth 2 (two) times daily in the am and at bedtime..      . amitriptyline (ELAVIL) 10 MG tablet Take 10 mg by mouth at bedtime.      Marland Kitchen aspirin-acetaminophen-caffeine (EXCEDRIN MIGRAINE) 250-250-65 MG per tablet Take 1 tablet by mouth every 6 (six) hours as needed.      . Azelastine-Fluticasone (DYMISTA NA) Place into the nose.      . clobetasol (TEMOVATE) 0.05 % external solution Use as directed  25 mL  0  . cyanocobalamin (,VITAMIN B-12,) 1000 MCG/ML injection Inject 1,000 mcg into the muscle every 30 (thirty) days.        . enalapril (VASOTEC) 20 MG tablet Take 1 tablet (20 mg total) by mouth daily.  90 tablet  3  . EPIPEN 2-PAK 0.3 MG/0.3ML DEVI as needed.      Marland Kitchen  furosemide (LASIX) 20 MG tablet Take 1 tablet (20 mg total) by mouth 2 (two) times daily.  180 tablet  3  . hydrocortisone (PROCTOSOL HC) 2.5 % rectal cream Place 1 application rectally as needed.  30 g  0  . Liraglutide (VICTOZA [place]) Inject into the skin.      Marland Kitchen loratadine (CLARITIN) 10 MG tablet Take 10 mg by mouth daily.      . meclizine (ANTIVERT) 25 MG tablet Take 25 mg by mouth 3 (three) times daily as needed.        . metFORMIN (GLUCOPHAGE) 500 MG tablet Take 500 mg by mouth daily with breakfast.      . mirabegron ER (MYRBETRIQ) 25  MG TB24 Take 25 mg by mouth daily.       . montelukast (SINGULAIR) 10 MG tablet       . omeprazole (PRILOSEC) 20 MG capsule Take 1 capsule (20 mg total) by mouth 2 (two) times daily.  180 capsule  3  . pantoprazole (PROTONIX) 40 MG tablet Take 40 mg by mouth daily.      Marland Kitchen PATADAY 0.2 % SOLN       . polyethylene glycol powder (GLYCOLAX/MIRALAX) powder Take 17 g by mouth 2 (two) times daily.  255 g  0  . rosuvastatin (CRESTOR) 10 MG tablet Take 5 mg by mouth daily.       . traZODone (DESYREL) 100 MG tablet TAKE 1/2 TO 1& 1/2 TABLETS BY MOUTH AT BEDTIME  135 tablet  0  . venlafaxine XR (EFFEXOR-XR) 150 MG 24 hr capsule TAKE ONE CAPSULE BY MOUTH DAILY. Take with 37.5 mg daily.  90 capsule  1  . venlafaxine XR (EFFEXOR-XR) 37.5 MG 24 hr capsule Take 3 capsules (112.5 mg total) by mouth daily with breakfast. Take with 150 mg. Take three capsules -(total 112.5 mg.)  90 capsule  1  . verapamil (CALAN) 80 MG tablet Take 1 tablet (80 mg total) by mouth daily.  90 tablet  3   No current facility-administered medications for this visit.    Suicidal Ideation: Negative Plan Formed: Negative Patient has means to carry out plan: Negative  Homicidal Ideation: Negative Plan Formed: Negative Patient has means to carry out plan: Negative  Neurologic: Headache: Negative Seizure: Negative Paresthesias: Negative  Past Medical Family: Reviewed Family History  Problem Relation Age of Onset  . Diabetes Mother   . Stroke Mother   . Heart disease Mother   . Hypertension Mother   . Hyperlipidemia Mother   . Anxiety disorder Mother   . Diabetes Brother   . Alcohol abuse Brother   . Drug abuse Brother   . Diabetes Maternal Aunt   . Diabetes Maternal Uncle   . Alcohol abuse Maternal Uncle   . Diabetes Maternal Grandmother   . Dementia Maternal Grandmother   . Depression Maternal Grandmother   . Alcohol abuse Father   . Alcohol abuse Paternal Uncle   . Alcohol abuse Maternal Grandfather   . Alcohol abuse  Paternal Grandfather   . Alcohol abuse Maternal Uncle   . Alcohol abuse Paternal Uncle   . Alcohol abuse Paternal Uncle     Social History: Reviewed Current Place of Residence: [place], [place] of Birth:[place], [place] Family Members: Lives with husband of 27 year Marital Status:  Married Children: 2  Sons: 1  Daughters: 1 Relationships: Daughter is her main source of emotional support and her friend of 18 years. Education:  [employer] Problems/Performance: None Religious Beliefs/Practices: Jehovah  Witnesses. History of Abuse: none Occupational Experiences: Has been disabled since 2008. 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 Military History:  None. Legal History: Patient denies.  Assessment:  AXIS I Generalized Anxiety Disorder-improved Major Depressive Disorder, recurrent, moderate-minimal improvement  Treatment Plan/Recommendations:  Plan of Care:   1. Affirm with the patient that the medications are taken as ordered. Patient  expressed understanding of how their medications were to be used.    Laboratory: No labs warranted at this time.   Psychotherapy: Therapy: brief supportive therapy provided. Discussed psychosocial stressors in detail. Focused on marital stressors. Again advised marital counseling.  More than 50% of the visit was spent on individual therapy.-  Medications:  Continue the following psychiatric medications as written prior to this appointment:  a)Decreased  venlafaxine 150 mg + 37.5mg  -patient was not able to increase at her last visit. b) Discontinue Alprazolam.  C) Changed Trazodone 100 mg-Take one-half one tablet at bedtime.  -Risks and benefits, side effects and alternatives discussed with patient, she was given an opportunity to ask questions about his/her medication, illness, and treatment. All current psychiatric medications have been reviewed and discussed with the patient and adjusted as  clinically appropriate. The patient has been provided an accurate and updated list of the medications being now prescribed.   Routine PRN Medications:  None  Consultations: The patient was encouraged to keep all PCP and specialty clinic appointments.   Safety Concerns:   Patient told to call clinic if any problems occur. Patient advised to go to  ER  if she should develop SI/HI, side effects, or if symptoms worsen. Has crisis numbers to call if needed.    Other:   8. Patient was instructed to return to clinic in 1 months.  9. The patient was advised to call and cancel their mental health appointment within 24 hours of the appointment, if they are unable to keep the appointment, as well as the three no show and termination from clinic policy. 10. The patient expressed understanding of the plan and agrees with the above. 11. Patient informed that April 15th, 2015 would be my last day at this clinic.   Times spent: 30 minutes Oakley Orban, Amedeo Gory, MD 2/25/201510:58 AM

## 2013-06-27 ENCOUNTER — Other Ambulatory Visit: Payer: Medicare Other

## 2013-06-27 ENCOUNTER — Ambulatory Visit: Payer: Medicare Other | Admitting: Oncology

## 2013-07-03 ENCOUNTER — Ambulatory Visit (HOSPITAL_BASED_OUTPATIENT_CLINIC_OR_DEPARTMENT_OTHER): Payer: Medicare HMO | Admitting: Oncology

## 2013-07-03 ENCOUNTER — Other Ambulatory Visit (HOSPITAL_BASED_OUTPATIENT_CLINIC_OR_DEPARTMENT_OTHER): Payer: Medicare HMO

## 2013-07-03 ENCOUNTER — Telehealth: Payer: Self-pay | Admitting: Oncology

## 2013-07-03 ENCOUNTER — Encounter: Payer: Self-pay | Admitting: Oncology

## 2013-07-03 VITALS — BP 118/76 | HR 106 | Temp 99.3°F | Resp 18 | Ht 66.0 in | Wt 223.8 lb

## 2013-07-03 DIAGNOSIS — D519 Vitamin B12 deficiency anemia, unspecified: Secondary | ICD-10-CM

## 2013-07-03 DIAGNOSIS — D509 Iron deficiency anemia, unspecified: Secondary | ICD-10-CM

## 2013-07-03 DIAGNOSIS — D518 Other vitamin B12 deficiency anemias: Secondary | ICD-10-CM | POA: Diagnosis not present

## 2013-07-03 DIAGNOSIS — D6489 Other specified anemias: Secondary | ICD-10-CM

## 2013-07-03 DIAGNOSIS — D649 Anemia, unspecified: Secondary | ICD-10-CM

## 2013-07-03 LAB — CBC WITH DIFFERENTIAL/PLATELET
BASO%: 0.8 % (ref 0.0–2.0)
Basophils Absolute: 0.1 10*3/uL (ref 0.0–0.1)
EOS%: 2 % (ref 0.0–7.0)
Eosinophils Absolute: 0.1 10*3/uL (ref 0.0–0.5)
HCT: 38.6 % (ref 34.8–46.6)
HGB: 12.5 g/dL (ref 11.6–15.9)
LYMPH#: 2 10*3/uL (ref 0.9–3.3)
LYMPH%: 29.1 % (ref 14.0–49.7)
MCH: 30.9 pg (ref 25.1–34.0)
MCHC: 32.5 g/dL (ref 31.5–36.0)
MCV: 95.1 fL (ref 79.5–101.0)
MONO#: 0.5 10*3/uL (ref 0.1–0.9)
MONO%: 6.8 % (ref 0.0–14.0)
NEUT#: 4.3 10*3/uL (ref 1.5–6.5)
NEUT%: 61.3 % (ref 38.4–76.8)
Platelets: 247 10*3/uL (ref 145–400)
RBC: 4.06 10*6/uL (ref 3.70–5.45)
RDW: 13.6 % (ref 11.2–14.5)
WBC: 7 10*3/uL (ref 3.9–10.3)

## 2013-07-03 LAB — FERRITIN CHCC: FERRITIN: 22 ng/mL (ref 9–269)

## 2013-07-03 LAB — IRON AND TIBC CHCC
%SAT: 25 % (ref 21–57)
IRON: 90 ug/dL (ref 41–142)
TIBC: 359 ug/dL (ref 236–444)
UIBC: 268 ug/dL (ref 120–384)

## 2013-07-03 NOTE — Patient Instructions (Signed)

## 2013-07-07 ENCOUNTER — Encounter: Payer: Self-pay | Admitting: Oncology

## 2013-07-07 NOTE — Progress Notes (Signed)
OFFICE PROGRESS NOTE  CC  [name], MD [address] [place] [address]  DIAGNOSIS: 58 year old female with iron deficiency and B12 deficiency associated anemia.  PRIOR THERAPY:  #1 patient has received B12 injections on a monthly basis. Her last B12 level was over 1200.  #2 she recently had iron studies performed and her ferritin is only at 35.  CURRENT THERAPY: B12 injections every month INTERVAL HISTORY: Kayla Mayo 58 y.o. female returns for Followup visit today. She denies having any bleeding problems she has no hematuria hematochezia melena hemoptysis or hematemesis. She denies having any purple paresthesias or difficult walking. She has no myalgias or arthralgias. Remainder of the 10 point review of systems is negative. MEDICAL HISTORY: Past Medical History  Diagnosis Date  . Anxiety   . Diabetes mellitus   . GERD (gastroesophageal reflux disease)   . Hyperlipidemia   . Hypertension   . Anal fissure   . Arthritis   . Chronic headaches   . Colon polyps   . Depression   . Gallstones   . IBS (irritable bowel syndrome)   . Obesity   . Status post cardiac catheterization   . Anemia 03/17/2011  . Osteopenia 07/2011    t score -1.5 FRAX 2.1%/0%  . Vertigo     Occurs 6 times a month  . Bursitis of hip   . Esophageal spasm   . Neuropathy   . Breast cancer     ALLERGIES:  is allergic to chicken allergy; codeine; corn-containing products; other; peanut-containing drug products; sesame oil; simvastatin; and wheat.  MEDICATIONS:  Current Outpatient Prescriptions  Medication Sig Dispense Refill  . AMITIZA 24 MCG capsule Take 1 capsule by mouth 2 (two) times daily in the am and at bedtime..      . amitriptyline (ELAVIL) 10 MG tablet Take 50 mg by mouth at bedtime.       Marland Kitchen aspirin-acetaminophen-caffeine (EXCEDRIN MIGRAINE) 250-250-65 MG per tablet Take 1 tablet by mouth every 6 (six) hours as needed.      . clobetasol (TEMOVATE) 0.05 % external  solution Use as directed  25 mL  0  . cyanocobalamin (,VITAMIN B-12,) 1000 MCG/ML injection Inject 1,000 mcg into the muscle every 30 (thirty) days.        . enalapril (VASOTEC) 20 MG tablet Take 1 tablet (20 mg total) by mouth daily.  90 tablet  3  . EPIPEN 2-PAK 0.3 MG/0.3ML DEVI as needed.      . furosemide (LASIX) 20 MG tablet Take 1 tablet (20 mg total) by mouth 2 (two) times daily.  180 tablet  3  . hydrocortisone (PROCTOSOL HC) 2.5 % rectal cream Place 1 application rectally as needed.  30 g  0  . Liraglutide (VICTOZA [place]) Inject into the skin.      Marland Kitchen loratadine (CLARITIN) 10 MG tablet Take 10 mg by mouth daily.      . meclizine (ANTIVERT) 25 MG tablet Take 25 mg by mouth 3 (three) times daily as needed.        . metFORMIN (GLUCOPHAGE) 500 MG tablet Take 500 mg by mouth daily with breakfast.      . mirabegron ER (MYRBETRIQ) 25 MG TB24 Take 25 mg by mouth daily.       . montelukast (SINGULAIR) 10 MG tablet       . omeprazole (PRILOSEC) 20 MG capsule Take 1 capsule (20 mg total) by mouth 2 (two) times daily.  180 capsule  3  . pantoprazole (PROTONIX) 40  MG tablet Take 40 mg by mouth daily.      Marland Kitchen PATADAY 0.2 % SOLN       . polyethylene glycol powder (GLYCOLAX/MIRALAX) powder Take 17 g by mouth 2 (two) times daily.  255 g  0  . rosuvastatin (CRESTOR) 10 MG tablet Take 5 mg by mouth daily.       . traZODone (DESYREL) 100 MG tablet TAKE 1/2 TO 1& 1/2 TABLETS BY MOUTH AT BEDTIME  135 tablet  0  . venlafaxine XR (EFFEXOR-XR) 150 MG 24 hr capsule TAKE ONE CAPSULE BY MOUTH DAILY. Take with 37.5 mg daily.  90 capsule  1  . venlafaxine XR (EFFEXOR-XR) 75 MG 24 hr capsule Take 1 capsule (75 mg total) by mouth daily with breakfast. Take with 150 mg.  90 capsule  1  . verapamil (CALAN) 80 MG tablet Take 1 tablet (80 mg total) by mouth daily.  90 tablet  3   No current facility-administered medications for this visit.    SURGICAL HISTORY:  Past Surgical History  Procedure Laterality Date  .  Appendectomy    . Cholecystectomy    . Gastric bypass    . Lower back surgery    . Left shouler surgery    . Tubal ligation    . Bilareral knee scopes    . Carpal tunnel release      REVIEW OF SYSTEMS:  Pertinent items are noted in HPI.   PHYSICAL EXAMINATION: General appearance: alert, cooperative and appears stated age Resp: clear to auscultation bilaterally and normal percussion bilaterally Back: symmetric, no curvature. ROM normal. No CVA tenderness. Cardio: regular rate and rhythm, S1, S2 normal, no murmur, click, rub or gallop GI: soft, non-tender; bowel sounds normal; no masses,  no organomegaly Extremities: extremities normal, atraumatic, no cyanosis or edema Neurologic: Alert and oriented X 3, normal strength and tone. Normal symmetric reflexes. Normal coordination and gait  ECOG PERFORMANCE STATUS: 1 - Symptomatic but completely ambulatory  Blood pressure 118/76, pulse 106, temperature 99.3 F (37.4 C), temperature source Oral, resp. rate 18, height 5\' 6"  (1.676 m), weight 223 lb 12.8 oz (101.515 kg), last menstrual period 05/02/2006.  LABORATORY DATA: Lab Results  Component Value Date   WBC 7.0 07/03/2013   HGB 12.5 07/03/2013   HCT 38.6 07/03/2013   MCV 95.1 07/03/2013   PLT 247 07/03/2013      Chemistry      Component Value Date/Time   NA 139 04/15/2011 1017   K 4.2 04/15/2011 1017   CL 104 04/15/2011 1017   CO2 26 04/15/2011 1017   BUN 11 04/15/2011 1017   CREATININE 0.80 04/15/2011 1017      Component Value Date/Time   CALCIUM 9.2 04/15/2011 1017   ALKPHOS 82 04/15/2011 1017   AST 17 04/15/2011 1017   ALT 10 04/15/2011 1017   BILITOT 0.6 04/15/2011 1017       RADIOGRAPHIC STUDIES:  No results found.  ASSESSMENT: 58 year old female with anemia do to B12 and iron deficiency. She will continue to receive B12 injections on a monthly basis. We will give her parenteral iron as needed for hypoferritnemia and  anemia.   PLAN:   #1 Patient will continue to  receive B12 injections on a monthly basis here at the [facility].  #2 we will call her with the results of her iron studies.  #3 I will plan on seeing her back in 6 months time in followup at which time we will do iron studies as  well as B12 levels.  All questions were answered. The patient knows to call the clinic with any problems, questions or concerns. We can certainly see the patient much sooner if necessary.  I spent 15 minutes counseling the patient face to face. The total time spent in the appointment was 20 minutes.    Marcy Panning, MD Medical/Oncology [facility] [phone] (beeper) [phone] (Office)  07/07/2013, 2:52 PM

## 2013-07-08 ENCOUNTER — Ambulatory Visit (HOSPITAL_BASED_OUTPATIENT_CLINIC_OR_DEPARTMENT_OTHER): Payer: Medicare HMO

## 2013-07-08 VITALS — BP 126/75 | HR 92 | Temp 99.5°F

## 2013-07-08 DIAGNOSIS — D649 Anemia, unspecified: Secondary | ICD-10-CM

## 2013-07-08 DIAGNOSIS — E538 Deficiency of other specified B group vitamins: Secondary | ICD-10-CM

## 2013-07-08 MED ORDER — CYANOCOBALAMIN 1000 MCG/ML IJ SOLN
1000.0000 ug | INTRAMUSCULAR | Status: DC
  Administered 2013-07-08: 1000 ug via INTRAMUSCULAR

## 2013-07-08 NOTE — Patient Instructions (Signed)
Vitamin B12 and Folate Test This is a test used to help diagnose the cause of anemia or neuropathy (nerve damage), to evaluate nutritional status in some patients, or to monitor effectiveness of treatment for B12 or folate deficiency. These tests measure the concentration of folate and vitamin B12 in the serum (liquid portion of the blood). The amount of folate inside the red blood cell (RBC) may also be measured ; it will normally be at a higher concentration inside the cell than in the serum.  B12 and folate are both part of the B complex of vitamins and come from food. Folate is found in leafy green vegetables, citrus fruits, dry beans and peas, liver, and yeast; while B12 is found in animal products such as red meat, fish, poultry, milk, and eggs. Fortified cereals, breads, and other grain products are now also important dietary sources of both B12 and folate (identified as "folic acid" on nutritional labels), especially for those vegetarians who do not consume any animal products.  Both B12 and folate are necessary for normal RBC formation, tissue and cellular repair, and DNA synthesis. B12 is also important for nerve health, while folate is necessary for cell division such as is seen in a fetus during pregnancy. A deficiency in either B12 or folate can lead to a form of anemia characterized by the production of fewer, but larger, RBC's (called macrocytes). A deficiency in B12 can also result in varying degrees of neuropathy, nerve damage that can cause tingling and numbness in the patient's hands and feet. A deficiency in folate can cause neural tube defects such as spina bifida in a growing fetus.  PREPARATION FOR TEST No fasting is necessary. A blood sample is obtained by inserting a needle into a vein in the arm. NORMAL FINDINGS 160-950 pg/mL or 118-701 pmol/L (SI units) Ranges for normal findings may vary among different laboratories and hospitals. You should always check with your doctor after  having lab work or other tests done to discuss the meaning of your test results and whether your values are considered within normal limits. MEANING OF TEST  Your caregiver will go over the test results with you and discuss the importance and meaning of your results, as well as treatment options and the need for additional tests if necessary. Reference values are dependent on many factors, including patient age, gender, sample population, and testing method. Numeric test results have different meanings in different labs. Your lab report should include the specific reference range for your test.  If there is a decreased concentration of B12 and/or folate, then it is likely there is some degree of deficiency. The test results will indicate the presence of the deficiency, but they do not necessarily reflect the severity of the anemia or neuropathy associated with the deficiency or its underlying cause. There are a variety of causes of B12 and/or folate deficiencies. Most of these are from poor intake, not absorbing the vitamins or losing more vitamins than are taken in. OBTAINING THE TEST RESULTS It is your responsibility to obtain your test results. Ask the lab or department performing the test when and how you will get your results. Document Released: 05/13/2004 Document Revised: 07/11/2011 Document Reviewed: 04/20/2008 [facility] Patient Information 2014 [place], [place].

## 2013-08-05 ENCOUNTER — Ambulatory Visit (HOSPITAL_BASED_OUTPATIENT_CLINIC_OR_DEPARTMENT_OTHER): Payer: Medicare HMO

## 2013-08-05 VITALS — BP 119/75 | HR 93 | Temp 98.8°F

## 2013-08-05 DIAGNOSIS — E538 Deficiency of other specified B group vitamins: Secondary | ICD-10-CM

## 2013-08-05 DIAGNOSIS — D649 Anemia, unspecified: Secondary | ICD-10-CM

## 2013-08-05 MED ORDER — CYANOCOBALAMIN 1000 MCG/ML IJ SOLN
1000.0000 ug | INTRAMUSCULAR | Status: DC
  Administered 2013-08-05: 1000 ug via INTRAMUSCULAR

## 2013-08-07 ENCOUNTER — Encounter (HOSPITAL_COMMUNITY): Payer: Self-pay | Admitting: Psychiatry

## 2013-08-07 ENCOUNTER — Encounter (INDEPENDENT_AMBULATORY_CARE_PROVIDER_SITE_OTHER): Payer: Self-pay

## 2013-08-07 ENCOUNTER — Ambulatory Visit (INDEPENDENT_AMBULATORY_CARE_PROVIDER_SITE_OTHER): Payer: Medicare HMO | Admitting: Psychiatry

## 2013-08-07 VITALS — BP 109/74 | HR 95 | Wt 226.0 lb

## 2013-08-07 DIAGNOSIS — F411 Generalized anxiety disorder: Secondary | ICD-10-CM

## 2013-08-07 DIAGNOSIS — F331 Major depressive disorder, recurrent, moderate: Secondary | ICD-10-CM

## 2013-08-07 MED ORDER — TRAZODONE HCL 100 MG PO TABS: ORAL_TABLET | ORAL | Status: DC

## 2013-08-07 MED ORDER — VENLAFAXINE HCL ER 75 MG PO CP24: 75.0000 mg | ORAL_CAPSULE | Freq: Every day | ORAL | Status: DC

## 2013-08-07 MED ORDER — VENLAFAXINE HCL ER 150 MG PO CP24: ORAL_CAPSULE | ORAL | Status: DC

## 2013-08-07 NOTE — Progress Notes (Signed)
Kayla Mayo 01-12-1956  Patient Identification:  Kayla Mayo  Date of Evaluation:  08/07/2013  Chief Complaint:  Chief Complaint  Patient presents with  . Follow-up   History of Chief Complaint:  HPI Comments: Chief Complaint:   SUBJECTIVE: HPI Comments: Ms. Kayla Mayo is a 58 year old female with a diagnosis of Generalized Anxiety Disorder and Major Depressive Disorder, recurrent, moderate.      . Location: The patient reports that she had difficulty with hyperglycemia for which she went to the hospital  . Quality: She reports that her relationship with her husband is about the same.  She reports her husband continues to be a Management consultant.  She reports her husband is communicating more. She reports she is taking her medications as denies any side effects.   In the area of affective symptoms, patient appears euthymic. Patient denies current suicidal ideation, intent, or plan. Patient denies current homicidal ideation, intent, or plan. Patient denies auditory hallucinations. Patient denies visual hallucinations. Patient denies symptoms of paranoia. Patient states sleep is still nonrestorative, despite approximately 7 hours of sleep per night, but she continue to wake up.  Appetite is good. Energy level is low. Patient endorses some symptoms of anhedonia. Patient denies hopelessness, helplessness, or guilt.   . Severity:  Depression: 5/10 (0=Very depressed; 5=Neutral; 10=Very Happy)  Anxiety- 5/10 (0=no anxiety; 5= moderate/tolerable anxiety; 10= panic attacks)  . Duration: Since the age of 51.  . Timing: no specific timing  . Context: Familial stressors. Marital stressors.  . Modifying factors: Improves with going places and meeting with people.  . Associated signs and symptoms: Denies any recent episodes consistent with mania, particularly decreased need for sleep with increased energy, grandiosity, impulsivity,  hyperverbal and pressured speech, or increased productivity. Denies any recent symptoms consistent with psychosis, particularly auditory or visual hallucinations, thought broadcasting/insertion/withdrawal, or ideas of reference. Also denies excessive worry to the point of physical symptoms as well as any panic attacks. Denies any history of trauma or symptoms consistent with PTSD such as flashbacks, nightmares, hypervigilance, feelings of numbness or inability to connect with others.   Review of Systems  Constitutional: Negative for fever, chills, activity change, appetite change and fatigue.  Eyes: Negative for photophobia.  Respiratory: Negative for apnea, cough, chest tightness and wheezing.   Cardiovascular: Negative for chest pain, palpitations and leg swelling.  Gastrointestinal: Negative for vomiting, abdominal pain, diarrhea, constipation, blood in stool, abdominal distention and anal bleeding.  Genitourinary: Negative for dysuria and dyspareunia.  Musculoskeletal: Positive for arthralgias and back pain.       Gait abnormalities due to knee pain. Muscle strength normal.  Neurological: Negative for dizziness, tremors, seizures, syncope and numbness (In right hand.).   Psychiatric: Agitation: Negative Hallucination: Negative Depressed Mood: Negative Insomnia: Negative Hypersomnia: No Altered Concentration: No Feels Worthless: No Grandiose Ideas: Negative Belief In Special Powers: Negative New/Increased Substance Abuse: Negative Compulsions: Negative Traumatic Brain Injury: Negative  Filed Vitals:   08/07/13 1403  BP: 109/74  Pulse: 95  Weight: 226 lb (102.513 kg)   Physical Exam  Vitals reviewed. Constitutional: She appears well-developed and well-nourished. No distress.  Skin: She is not diaphoretic.  Musculoskeletal: Gait & Station: normal Patient leans: N/A   Psychiatric Specialty Examination: Objective:  Appearance: Casual and Fairly Groomed  Eye Contact::  Fair   Speech:  Clear and Coherent and Normal Rate  Volume:  Normal  Mood:  "pretty good"  Affect:  Appropriate, Congruent and  Full Range  Thought Process:  Coherent, Linear and Logical  Orientation:  Full (Time, Place, and Person)  Thought Content:  WDL  Suicidal Thoughts:  No  Homicidal Thoughts:  No  Judgement:  Fair  Insight:  Fair  Psychomotor Activity:  Normal  Akathisia:  No  Handed:  Right  Memory: 3/3 immediate; 3/3 recent  [place] of Knowledge good  AIMS (if indicated):  Not indicated  Assets:  Communication Skills Desire for Improvement Financial Resources/Insurance Housing Intimacy Leisure Time Physical Health Resilience Social Support Talents/Skills Transportation    Laboratory/X-Ray Psychological Evaluation(s)   None  None   Past Psychiatric History: Reviewed Diagnosis: Adjustment Disorder with mixed emotions  Hospitalizations: Patient denies.  Outpatient Care: Yes-first in 1991 (first husband left her), and in 2010 (due to marital stressors)  Substance Abuse Care: Patient denies.  Self-Mutilation: Patient denies.  Suicidal Attempts: Patient denies.  Violent Behaviors: none reported  History of Electroconvulsive Shock Therapy: Negative    Past Medical History:  Reviewed Past Medical History  Diagnosis Date  . Anxiety   . Diabetes mellitus   . GERD (gastroesophageal reflux disease)   . Hyperlipidemia   . Hypertension   . Anal fissure   . Arthritis   . Chronic headaches   . Colon polyps   . Depression   . Gallstones   . IBS (irritable bowel syndrome)   . Obesity   . Status post cardiac catheterization   . Anemia 03/17/2011  . Osteopenia 07/2011    t score -1.5 FRAX 2.1%/0%  . Vertigo     Occurs 6 times a month  . Bursitis of hip   . Esophageal spasm   . Neuropathy   . Breast cancer    History of Loss of Consciousness:  No Seizure History:  Negative Cardiac History:  Yes Hyperlidiemia and Hypertension Allergies:   Reviewed  Allergies  Allergen Reactions  . Chicken Allergy     Sinus drainage  . Codeine   . Corn-Containing Products   . Other     Statins  . Peanut-Containing Drug Products   . Sesame Oil     Sinus drainage  . Simvastatin     Muscle pain  . Wheat     Sinus drainage   Current Medications: Reviewed Current Outpatient Prescriptions  Medication Sig Dispense Refill  . AMITIZA 24 MCG capsule Take 1 capsule by mouth 2 (two) times daily in the am and at bedtime..      . amitriptyline (ELAVIL) 10 MG tablet Take 50 mg by mouth at bedtime.       Marland Kitchen aspirin-acetaminophen-caffeine (EXCEDRIN MIGRAINE) 250-250-65 MG per tablet Take 1 tablet by mouth every 6 (six) hours as needed.      . clobetasol (TEMOVATE) 0.05 % external solution Use as directed  25 mL  0  . cyanocobalamin (,VITAMIN B-12,) 1000 MCG/ML injection Inject 1,000 mcg into the muscle every 30 (thirty) days.        . enalapril (VASOTEC) 20 MG tablet Take 1 tablet (20 mg total) by mouth daily.  90 tablet  3  . EPIPEN 2-PAK 0.3 MG/0.3ML DEVI as needed.      . furosemide (LASIX) 20 MG tablet Take 1 tablet (20 mg total) by mouth 2 (two) times daily.  180 tablet  3  . hydrocortisone (PROCTOSOL HC) 2.5 % rectal cream Place 1 application rectally as needed.  30 g  0  . Liraglutide (VICTOZA [place]) Inject into the skin.      Marland Kitchen  loratadine (CLARITIN) 10 MG tablet Take 10 mg by mouth daily.      . meclizine (ANTIVERT) 25 MG tablet Take 25 mg by mouth 3 (three) times daily as needed.        . metFORMIN (GLUCOPHAGE) 500 MG tablet Take 500 mg by mouth daily with breakfast.      . mirabegron ER (MYRBETRIQ) 25 MG TB24 Take 25 mg by mouth daily.       . montelukast (SINGULAIR) 10 MG tablet       . omeprazole (PRILOSEC) 20 MG capsule Take 1 capsule (20 mg total) by mouth 2 (two) times daily.  180 capsule  3  . pantoprazole (PROTONIX) 40 MG tablet Take 40 mg by mouth daily.      Marland Kitchen PATADAY 0.2 % SOLN       . polyethylene glycol powder (GLYCOLAX/MIRALAX)  powder Take 17 g by mouth 2 (two) times daily.  255 g  0  . rosuvastatin (CRESTOR) 10 MG tablet Take 5 mg by mouth daily.       . traZODone (DESYREL) 100 MG tablet TAKE 1/2 TO 1& 1/2 TABLETS BY MOUTH AT BEDTIME  135 tablet  0  . venlafaxine XR (EFFEXOR-XR) 150 MG 24 hr capsule TAKE ONE CAPSULE BY MOUTH DAILY. Take with 37.5 mg daily.  90 capsule  1  . venlafaxine XR (EFFEXOR-XR) 75 MG 24 hr capsule Take 1 capsule (75 mg total) by mouth daily with breakfast. Take with 150 mg.  90 capsule  1  . verapamil (CALAN) 80 MG tablet Take 1 tablet (80 mg total) by mouth daily.  90 tablet  3   No current facility-administered medications for this visit.    Suicidal Ideation: Negative Plan Formed: Negative Patient has means to carry out plan: Negative  Homicidal Ideation: Negative Plan Formed: Negative Patient has means to carry out plan: Negative  Neurologic: Headache: Negative Seizure: Negative Paresthesias: Negative  Past Medical Family: Reviewed Family History  Problem Relation Age of Onset  . Diabetes Mother   . Stroke Mother   . Heart disease Mother   . Hypertension Mother   . Hyperlipidemia Mother   . Anxiety disorder Mother   . Diabetes Brother   . Alcohol abuse Brother   . Drug abuse Brother   . Diabetes Maternal Aunt   . Diabetes Maternal Uncle   . Alcohol abuse Maternal Uncle   . Diabetes Maternal Grandmother   . Dementia Maternal Grandmother   . Depression Maternal Grandmother   . Alcohol abuse Father   . Alcohol abuse Paternal Uncle   . Alcohol abuse Maternal Grandfather   . Alcohol abuse Paternal Grandfather   . Alcohol abuse Maternal Uncle   . Alcohol abuse Paternal Uncle   . Alcohol abuse Paternal Uncle     Social History: Reviewed Current Place of Residence: [place], [place] of Birth:[place], [place] Family Members: Lives with husband of 68 year Marital Status:  Married Children: 2  Sons: 1  Daughters: 1 Relationships: Daughter is her main source of emotional  support and her friend of 47 years. Education:  [employer] Problems/Performance: None Religious Beliefs/Practices: Haematologist. History of Abuse: none Occupational Experiences: Has been disabled since 2008. 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 Military History:  None. Legal History: Patient denies.  Assessment:  AXIS I Generalized Anxiety Disorder-improved Major Depressive Disorder, recurrent, moderate-minimal improvement  Treatment Plan/Recommendations:  Plan of Care:   1. Affirm with  the patient that the medications are taken as ordered. Patient  expressed understanding of how their medications were to be used.    Laboratory: No labs warranted at this time.   Psychotherapy: Therapy: brief supportive therapy provided. Discussed psychosocial stressors in detail. Focused on marital stressors. Again advised marital counseling.  More than 50% of the visit was spent on individual therapy.-  Medications:  Continue the following psychiatric medications as written prior to this appointment:  a)Continue  venlafaxine 150 mg + 37.5mg  -patient was not able to increase at her last visit. b) Changed Trazodone 100 mg-Take one-half one tablet at bedtime.  -Risks and benefits, side effects and alternatives discussed with patient, she was given an opportunity to ask questions about his/her medication, illness, and treatment. All current psychiatric medications have been reviewed and discussed with the patient and adjusted as clinically appropriate. The patient has been provided an accurate and updated list of the medications being now prescribed.   Routine PRN Medications:  None  Consultations: The patient was encouraged to keep all PCP and specialty clinic appointments.   Safety Concerns:   Patient told to call clinic if any problems occur. Patient advised to go to  ER  if she should develop SI/HI, side effects, or if symptoms worsen. Has  crisis numbers to call if needed.    Other:   8. Patient was instructed to return to clinic in 1 months.  9. The patient was advised to call and cancel their mental health appointment within 24 hours of the appointment, if they are unable to keep the appointment, as well as the three no show and termination from clinic policy. 10. The patient expressed understanding of the plan and agrees with the above. 11. Patient informed that April 15th, 2015 would be my last day at this clinic.   Times spent: 30 minutes Coralyn Helling, MD 4/8/20152:07 PM

## 2013-08-15 DIAGNOSIS — F419 Anxiety disorder, unspecified: Secondary | ICD-10-CM | POA: Insufficient documentation

## 2013-09-02 ENCOUNTER — Ambulatory Visit (HOSPITAL_BASED_OUTPATIENT_CLINIC_OR_DEPARTMENT_OTHER): Payer: Medicare HMO

## 2013-09-02 VITALS — BP 117/68 | HR 85 | Temp 98.8°F

## 2013-09-02 DIAGNOSIS — D649 Anemia, unspecified: Secondary | ICD-10-CM

## 2013-09-02 DIAGNOSIS — D518 Other vitamin B12 deficiency anemias: Secondary | ICD-10-CM

## 2013-09-02 MED ORDER — CYANOCOBALAMIN 1000 MCG/ML IJ SOLN
1000.0000 ug | INTRAMUSCULAR | Status: DC
  Administered 2013-09-02: 1000 ug via INTRAMUSCULAR

## 2013-09-23 ENCOUNTER — Other Ambulatory Visit (HOSPITAL_COMMUNITY): Payer: Self-pay | Admitting: Psychiatry

## 2013-09-30 ENCOUNTER — Ambulatory Visit: Payer: Self-pay

## 2013-10-01 ENCOUNTER — Ambulatory Visit (HOSPITAL_BASED_OUTPATIENT_CLINIC_OR_DEPARTMENT_OTHER): Payer: Medicare HMO

## 2013-10-01 VITALS — BP 135/79 | HR 93 | Temp 98.5°F

## 2013-10-01 DIAGNOSIS — D649 Anemia, unspecified: Secondary | ICD-10-CM

## 2013-10-01 DIAGNOSIS — D518 Other vitamin B12 deficiency anemias: Secondary | ICD-10-CM

## 2013-10-01 MED ORDER — CYANOCOBALAMIN 1000 MCG/ML IJ SOLN
1000.0000 ug | INTRAMUSCULAR | Status: DC
  Administered 2013-10-01: 1000 ug via INTRAMUSCULAR

## 2013-10-09 DIAGNOSIS — M1990 Unspecified osteoarthritis, unspecified site: Secondary | ICD-10-CM | POA: Insufficient documentation

## 2013-10-09 DIAGNOSIS — M25569 Pain in unspecified knee: Secondary | ICD-10-CM | POA: Insufficient documentation

## 2013-10-28 ENCOUNTER — Ambulatory Visit (HOSPITAL_BASED_OUTPATIENT_CLINIC_OR_DEPARTMENT_OTHER): Payer: Medicare HMO

## 2013-10-28 VITALS — BP 142/90 | HR 84 | Temp 98.5°F

## 2013-10-28 DIAGNOSIS — D518 Other vitamin B12 deficiency anemias: Secondary | ICD-10-CM

## 2013-10-28 DIAGNOSIS — D509 Iron deficiency anemia, unspecified: Secondary | ICD-10-CM

## 2013-10-28 MED ORDER — CYANOCOBALAMIN 1000 MCG/ML IJ SOLN
1000.0000 ug | INTRAMUSCULAR | Status: DC
  Administered 2013-10-28: 1000 ug via INTRAMUSCULAR

## 2013-11-25 ENCOUNTER — Ambulatory Visit (HOSPITAL_BASED_OUTPATIENT_CLINIC_OR_DEPARTMENT_OTHER): Payer: Medicare HMO

## 2013-11-25 VITALS — BP 120/72 | HR 87 | Temp 98.9°F

## 2013-11-25 DIAGNOSIS — D518 Other vitamin B12 deficiency anemias: Secondary | ICD-10-CM

## 2013-11-25 DIAGNOSIS — D508 Other iron deficiency anemias: Secondary | ICD-10-CM

## 2013-11-25 MED ORDER — CYANOCOBALAMIN 1000 MCG/ML IJ SOLN
1000.0000 ug | INTRAMUSCULAR | Status: DC
  Administered 2013-11-25: 1000 ug via INTRAMUSCULAR

## 2013-11-26 ENCOUNTER — Other Ambulatory Visit: Payer: Self-pay | Admitting: *Deleted

## 2013-11-26 ENCOUNTER — Telehealth: Payer: Self-pay | Admitting: *Deleted

## 2013-11-26 DIAGNOSIS — D6489 Other specified anemias: Secondary | ICD-10-CM

## 2013-11-26 DIAGNOSIS — D649 Anemia, unspecified: Secondary | ICD-10-CM

## 2013-11-26 NOTE — Telephone Encounter (Signed)
Pt called stating that she went and saw her PCP and her HEM is off and needs to be seen here.  Confirmed 11/28/13 appt w/ pt.  Gave info to [place] to put lab orders in.

## 2013-11-28 ENCOUNTER — Other Ambulatory Visit (HOSPITAL_BASED_OUTPATIENT_CLINIC_OR_DEPARTMENT_OTHER): Payer: Medicare HMO

## 2013-11-28 ENCOUNTER — Other Ambulatory Visit: Payer: Self-pay | Admitting: *Deleted

## 2013-11-28 ENCOUNTER — Ambulatory Visit (HOSPITAL_BASED_OUTPATIENT_CLINIC_OR_DEPARTMENT_OTHER): Payer: Medicare HMO | Admitting: Hematology

## 2013-11-28 ENCOUNTER — Encounter: Payer: Self-pay | Admitting: Hematology

## 2013-11-28 ENCOUNTER — Ambulatory Visit: Payer: Medicare HMO

## 2013-11-28 VITALS — BP 108/64 | HR 103 | Temp 99.2°F | Resp 18 | Ht 66.0 in | Wt 236.2 lb

## 2013-11-28 DIAGNOSIS — E119 Type 2 diabetes mellitus without complications: Secondary | ICD-10-CM

## 2013-11-28 DIAGNOSIS — D518 Other vitamin B12 deficiency anemias: Secondary | ICD-10-CM

## 2013-11-28 DIAGNOSIS — D509 Iron deficiency anemia, unspecified: Secondary | ICD-10-CM

## 2013-11-28 DIAGNOSIS — D539 Nutritional anemia, unspecified: Secondary | ICD-10-CM

## 2013-11-28 DIAGNOSIS — K5900 Constipation, unspecified: Secondary | ICD-10-CM

## 2013-11-28 DIAGNOSIS — K5289 Other specified noninfective gastroenteritis and colitis: Secondary | ICD-10-CM

## 2013-11-28 DIAGNOSIS — D649 Anemia, unspecified: Secondary | ICD-10-CM

## 2013-11-28 DIAGNOSIS — D6489 Other specified anemias: Secondary | ICD-10-CM

## 2013-11-28 DIAGNOSIS — Z9884 Bariatric surgery status: Secondary | ICD-10-CM

## 2013-11-28 LAB — COMPREHENSIVE METABOLIC PANEL (CC13)
ALBUMIN: 3.4 g/dL — AB (ref 3.5–5.0)
ALK PHOS: 103 U/L (ref 40–150)
ALT: 12 U/L (ref 0–55)
ANION GAP: 9 meq/L (ref 3–11)
AST: 17 U/L (ref 5–34)
BUN: 11 mg/dL (ref 7.0–26.0)
CALCIUM: 8.9 mg/dL (ref 8.4–10.4)
CHLORIDE: 104 meq/L (ref 98–109)
CO2: 23 meq/L (ref 22–29)
Creatinine: 1.2 mg/dL — ABNORMAL HIGH (ref 0.6–1.1)
GLUCOSE: 450 mg/dL — AB (ref 70–140)
POTASSIUM: 4.4 meq/L (ref 3.5–5.1)
Sodium: 136 mEq/L (ref 136–145)
Total Bilirubin: 0.47 mg/dL (ref 0.20–1.20)
Total Protein: 6.6 g/dL (ref 6.4–8.3)

## 2013-11-28 LAB — IRON AND TIBC CHCC
%SAT: 16 % — AB (ref 21–57)
Iron: 59 ug/dL (ref 41–142)
TIBC: 363 ug/dL (ref 236–444)
UIBC: 304 ug/dL (ref 120–384)

## 2013-11-28 LAB — VITAMIN B12: VITAMIN B 12: 1541 pg/mL — AB (ref 211–911)

## 2013-11-28 LAB — CBC WITH DIFFERENTIAL/PLATELET
BASO%: 1.2 % (ref 0.0–2.0)
BASOS ABS: 0.1 10*3/uL (ref 0.0–0.1)
EOS ABS: 0.1 10*3/uL (ref 0.0–0.5)
EOS%: 2.5 % (ref 0.0–7.0)
HCT: 34.7 % — ABNORMAL LOW (ref 34.8–46.6)
HEMOGLOBIN: 11.1 g/dL — AB (ref 11.6–15.9)
LYMPH%: 31.6 % (ref 14.0–49.7)
MCH: 30.6 pg (ref 25.1–34.0)
MCHC: 32 g/dL (ref 31.5–36.0)
MCV: 95.8 fL (ref 79.5–101.0)
MONO#: 0.2 10*3/uL (ref 0.1–0.9)
MONO%: 4.8 % (ref 0.0–14.0)
NEUT%: 59.9 % (ref 38.4–76.8)
NEUTROS ABS: 2.8 10*3/uL (ref 1.5–6.5)
Platelets: 267 10*3/uL (ref 145–400)
RBC: 3.63 10*6/uL — ABNORMAL LOW (ref 3.70–5.45)
RDW: 13.9 % (ref 11.2–14.5)
WBC: 4.7 10*3/uL (ref 3.9–10.3)
lymph#: 1.5 10*3/uL (ref 0.9–3.3)

## 2013-11-28 LAB — DRAW EXTRA CLOT TUBE

## 2013-11-28 LAB — FERRITIN CHCC: FERRITIN: 10 ng/mL (ref 9–269)

## 2013-11-28 MED ORDER — METOCLOPRAMIDE HCL 10 MG PO TABS: 10.0000 mg | ORAL_TABLET | Freq: Two times a day (BID) | ORAL | Status: DC

## 2013-11-28 NOTE — Progress Notes (Signed)
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   #2 She had iron infusions in past for a low Ferritin.  CURRENT THERAPY: B12 injections every month and iron infuion prn INTERVAL HISTORY:  Kayla Mayo 58 y.o. female returns for Followup visit today. She denies having any bleeding problems she has no hematuria hematochezia melena hemoptysis or hematemesis. She denies having any paresthesias or difficult walking. She has no myalgias or arthralgias. The patient underwent a gastric bypass surgery 9 years ago and that is the reason for her malabsorption and some nutritional deficiencies. She's been maintained on B12 shots on a monthly basis although she states it that it does not make much of a difference in how she feels. She also has chronic constipation currently she is taking 3 medications to address that including MiraLAX, emetiza and Senokot 3 times a day. Despite this she get 1 bowel movement every day. She did not receive any iron infusion in 2015. Her gastroenterologist attempted a colonoscopy 3 months ago but she had so much still impacted that he cannot complete the procedure and another one has been scheduled in August before this she also had 2 previous colonoscopies and history of colon polyps which were benign. When her primary care physician check CBC her hemoglobin was 10.6 and she was referred here. Today we also did a CBC, iron studies and some other tests for anemia. In terms of her GI workup she should get a complete colonoscopy and consideration for a capsule study. She also had a upper endoscopy not too long ago where no lesions were detected but she had an esophageal stricture/narrowing which was dilated. She does complain of feeling tired all  the time she does not consume a lot of soda. Her blood sugar today was 450 and I'm of the concern that this poorly controlled diabetes can be contributing to gastroparesis.  Remainder of the 10 point review of systems is negative.  MEDICAL HISTORY: Past Medical History  Diagnosis Date  . Anxiety   . Diabetes mellitus   . GERD (gastroesophageal reflux disease)   . Hyperlipidemia   . Hypertension   . Anal fissure   . Arthritis   . Chronic headaches   . Colon polyps   . Depression   . Gallstones   . IBS (irritable bowel syndrome)   . Obesity   . Status post cardiac catheterization   . Anemia 03/17/2011  . Osteopenia 07/2011    t score -1.5 FRAX 2.1%/0%  . Vertigo     Occurs 6 times a month  . Bursitis of hip   . Esophageal spasm   . Neuropathy   . Breast cancer     ALLERGIES:  is allergic to chicken allergy; codeine; corn-containing products; other; peanut-containing drug products; sesame oil; simvastatin; and wheat.  MEDICATIONS:  Current Outpatient Prescriptions  Medication Sig Dispense Refill  . AMITIZA 24 MCG capsule Take 1 capsule by mouth 2 (two) times daily in the am and at bedtime..      . amitriptyline (ELAVIL) 10 MG tablet Take 50 mg by mouth at bedtime.       Marland Kitchen aspirin-acetaminophen-caffeine (EXCEDRIN MIGRAINE) 250-250-65 MG per tablet Take 1 tablet by mouth every 6 (six) hours as needed.      . clobetasol (TEMOVATE) 0.05 %  external solution Use as directed  25 mL  0  . cyanocobalamin (,VITAMIN B-12,) 1000 MCG/ML injection Inject 1,000 mcg into the muscle every 30 (thirty) days.        . enalapril (VASOTEC) 20 MG tablet Take 1 tablet (20 mg total) by mouth daily.  90 tablet  3  . EPIPEN 2-PAK 0.3 MG/0.3ML DEVI as needed.      . furosemide (LASIX) 20 MG tablet Take 1 tablet (20 mg total) by mouth 2 (two) times daily.  180 tablet  3  . hydrocortisone (PROCTOSOL HC) 2.5 % rectal cream Place 1 application rectally as needed.  30 g  0  . Liraglutide (VICTOZA [place])  Inject into the skin.      Marland Kitchen loratadine (CLARITIN) 10 MG tablet Take 10 mg by mouth daily.      . meclizine (ANTIVERT) 25 MG tablet Take 25 mg by mouth 3 (three) times daily as needed.        . metFORMIN (GLUCOPHAGE) 500 MG tablet Take 500 mg by mouth 2 (two) times daily with a meal.       . mirabegron ER (MYRBETRIQ) 25 MG TB24 Take 25 mg by mouth daily.       . montelukast (SINGULAIR) 10 MG tablet       . omeprazole (PRILOSEC) 20 MG capsule Take 1 capsule (20 mg total) by mouth 2 (two) times daily.  180 capsule  3  . pantoprazole (PROTONIX) 40 MG tablet Take 40 mg by mouth daily.      Marland Kitchen PATADAY 0.2 % SOLN       . polyethylene glycol powder (GLYCOLAX/MIRALAX) powder Take 17 g by mouth 2 (two) times daily.  255 g  0  . rosuvastatin (CRESTOR) 10 MG tablet Take 5 mg by mouth daily.       . traZODone (DESYREL) 100 MG tablet 1& 1/2 TABLETS BY MOUTH AT BEDTIME  135 tablet  1  . venlafaxine XR (EFFEXOR-XR) 150 MG 24 hr capsule TAKE ONE CAPSULE BY MOUTH DAILY. Take with 37.5 mg daily.  90 capsule  1  . venlafaxine XR (EFFEXOR-XR) 75 MG 24 hr capsule Take 1 capsule (75 mg total) by mouth daily with breakfast. Take with 150 mg.  90 capsule  1  . verapamil (CALAN) 80 MG tablet Take 1 tablet (80 mg total) by mouth daily.  90 tablet  3  . metoCLOPramide (REGLAN) 10 MG tablet Take 1 tablet (10 mg total) by mouth 2 (two) times daily.  60 tablet  1   No current facility-administered medications for this visit.    SURGICAL HISTORY:  Past Surgical History  Procedure Laterality Date  . Appendectomy    . Cholecystectomy    . Gastric bypass    . Lower back surgery    . Left shouler surgery    . Tubal ligation    . Bilareral knee scopes    . Carpal tunnel release      REVIEW OF SYSTEMS:  Pertinent items are noted in HPI.   PHYSICAL EXAMINATION: General appearance: alert, cooperative and appears stated age Resp: clear to auscultation bilaterally and normal percussion bilaterally Back: symmetric, no  curvature. ROM normal. No CVA tenderness. Cardio: regular rate and rhythm, S1, S2 normal, no murmur, click, rub or gallop GI: soft, non-tender; bowel sounds normal; no masses,  no organomegaly Extremities: extremities normal, atraumatic, no cyanosis or edema Neurologic: Alert and oriented X 3, normal strength and tone. Normal symmetric reflexes. Normal coordination and gait  ECOG PERFORMANCE STATUS: 1 - Symptomatic but completely ambulatory  Blood pressure 108/64, pulse 103, temperature 99.2 F (37.3 C), temperature source Oral, resp. rate 18, height 5\' 6"  (1.676 m), weight 236 lb 3.2 oz (107.14 kg), last menstrual period 05/02/2006.  LABORATORY DATA: Lab Results  Component Value Date   WBC 4.7 11/28/2013   HGB 11.1* 11/28/2013   HCT 34.7* 11/28/2013   MCV 95.8 11/28/2013   PLT 267 11/28/2013      Chemistry      Component Value Date/Time   NA 136 11/28/2013 1015   NA 139 04/15/2011 1017   K 4.4 11/28/2013 1015   K 4.2 04/15/2011 1017   CL 104 04/15/2011 1017   CO2 23 11/28/2013 1015   CO2 26 04/15/2011 1017   BUN 11.0 11/28/2013 1015   BUN 11 04/15/2011 1017   CREATININE 1.2* 11/28/2013 1015   CREATININE 0.80 04/15/2011 1017      Component Value Date/Time   CALCIUM 8.9 11/28/2013 1015   CALCIUM 9.2 04/15/2011 1017   ALKPHOS 103 11/28/2013 1015   ALKPHOS 82 04/15/2011 1017   AST 17 11/28/2013 1015   AST 17 04/15/2011 1017   ALT 12 11/28/2013 1015   ALT 10 04/15/2011 1017   BILITOT 0.47 11/28/2013 1015   BILITOT 0.6 04/15/2011 1017              Shown above Graph for her Ferritin       RADIOGRAPHIC STUDIES:  Mammogram 01/25/12 Negative for malignancy BIRADS category 1.  ASSESSMENT:   30. 58 year old female with anemia do to B12 and iron deficiency. She will continue to receive B12 injections on a monthly basis. We will give her parenteral iron as needed for hypoferritnemia and  Anemia.  2. It is is possible that her anemia is multifactorial and she may have  associated deficiency of copper, zinc, vitamin C, folic acid. We are checking those values.  3. her iron studies from today indicated low-fat and and low iron saturation and I discussed that with the patient. We will arrange for her to receive Feraheme infusion next week on 12/03/2013.  4. regarding her chronic constipation and possibility of diabetes causing gastroparesis, I am recommending a trial of Reglan to see if it improves her gastric motility as it is a prokinetic drug.   5. she had an upper endoscopy, a colonoscopy has to be repeated in August, and she can benefit from a capsule endoscopy also known as PillCam study.   PLAN:   #1 Patient will continue to receive B12 injections on a monthly basis here at the [facility].  #2 we will call her with the results of studies b12, folate, copper, zinc and vitamin c.  #3 I will plan on seeing her back in November for f/u at which time we will do iron studies as well as B12 levels.  #4 She will get Feraheme infusion on 12/03/2013. Start taking Vitamin C to help iron absorption esp in background of Gastric bypass surgery.  #5 Use Reglan for a month to help symptoms related to gastroparesis and it may help with relieving her constipation.  All questions were answered. The patient knows to call the clinic with any problems, questions or concerns. We can certainly see the patient much sooner if necessary.  I spent 25 minutes counseling the patient face to face. The total time spent in the appointment was 35 minutes.   Bernadene Bell, MD Medical Hematologist/Oncologist [place] Pager: [phone] Office No:  [phone]  11/28/2013, 5:56 PM

## 2013-11-29 ENCOUNTER — Telehealth: Payer: Self-pay | Admitting: Oncology

## 2013-11-29 NOTE — Telephone Encounter (Signed)
s.w. pt and advised on Aug appt....pt ok and aware °

## 2013-12-03 ENCOUNTER — Ambulatory Visit (HOSPITAL_BASED_OUTPATIENT_CLINIC_OR_DEPARTMENT_OTHER): Payer: Medicare HMO

## 2013-12-03 VITALS — BP 101/65 | HR 78 | Temp 98.3°F | Resp 18

## 2013-12-03 DIAGNOSIS — D509 Iron deficiency anemia, unspecified: Secondary | ICD-10-CM

## 2013-12-03 DIAGNOSIS — D508 Other iron deficiency anemias: Secondary | ICD-10-CM

## 2013-12-03 MED ORDER — SODIUM CHLORIDE 0.9 % IV SOLN
Freq: Once | INTRAVENOUS | Status: AC
  Administered 2013-12-03: 14:00:00 via INTRAVENOUS

## 2013-12-03 MED ORDER — SODIUM CHLORIDE 0.9 % IV SOLN
1020.0000 mg | Freq: Once | INTRAVENOUS | Status: AC
  Administered 2013-12-03: 1020 mg via INTRAVENOUS
  Filled 2013-12-03: qty 34

## 2013-12-03 NOTE — Patient Instructions (Signed)

## 2013-12-04 LAB — VITAMIN C: Vitamin C: 0.6 mg/dL (ref 0.2–1.5)

## 2013-12-04 LAB — ZINC: Zinc: 74 ug/dL (ref 60–130)

## 2013-12-04 LAB — COPPER, SERUM: Copper: 120 ug/dL (ref 70–175)

## 2013-12-23 ENCOUNTER — Ambulatory Visit (HOSPITAL_BASED_OUTPATIENT_CLINIC_OR_DEPARTMENT_OTHER): Payer: Medicare HMO

## 2013-12-23 VITALS — BP 100/66 | HR 81 | Temp 99.4°F

## 2013-12-23 DIAGNOSIS — E538 Deficiency of other specified B group vitamins: Secondary | ICD-10-CM

## 2013-12-23 DIAGNOSIS — D509 Iron deficiency anemia, unspecified: Secondary | ICD-10-CM

## 2013-12-23 MED ORDER — CYANOCOBALAMIN 1000 MCG/ML IJ SOLN
1000.0000 ug | INTRAMUSCULAR | Status: DC
  Administered 2013-12-23: 1000 ug via INTRAMUSCULAR

## 2014-01-20 ENCOUNTER — Ambulatory Visit (HOSPITAL_BASED_OUTPATIENT_CLINIC_OR_DEPARTMENT_OTHER): Payer: Medicare HMO

## 2014-01-20 VITALS — BP 142/79 | HR 92 | Temp 99.3°F

## 2014-01-20 DIAGNOSIS — D511 Vitamin B12 deficiency anemia due to selective vitamin B12 malabsorption with proteinuria: Secondary | ICD-10-CM

## 2014-01-20 DIAGNOSIS — D518 Other vitamin B12 deficiency anemias: Secondary | ICD-10-CM

## 2014-01-20 MED ORDER — CYANOCOBALAMIN 1000 MCG/ML IJ SOLN
1000.0000 ug | INTRAMUSCULAR | Status: DC
  Administered 2014-01-20: 1000 ug via INTRAMUSCULAR

## 2014-02-17 ENCOUNTER — Ambulatory Visit: Payer: Medicare HMO

## 2014-02-17 MED ORDER — CYANOCOBALAMIN 1000 MCG/ML IJ SOLN: 1000.0000 ug | INTRAMUSCULAR | Status: AC

## 2014-02-19 ENCOUNTER — Ambulatory Visit (HOSPITAL_BASED_OUTPATIENT_CLINIC_OR_DEPARTMENT_OTHER): Payer: Medicare HMO

## 2014-02-19 VITALS — BP 116/78 | HR 89 | Temp 98.5°F

## 2014-02-19 DIAGNOSIS — D649 Anemia, unspecified: Secondary | ICD-10-CM

## 2014-02-19 DIAGNOSIS — E538 Deficiency of other specified B group vitamins: Secondary | ICD-10-CM

## 2014-02-19 MED ORDER — CYANOCOBALAMIN 1000 MCG/ML IJ SOLN: 1000.0000 ug | INTRAMUSCULAR | Status: DC

## 2014-02-19 MED ORDER — CYANOCOBALAMIN 1000 MCG/ML IJ SOLN
1000.0000 ug | INTRAMUSCULAR | Status: DC
Start: 2014-02-19 — End: 2014-02-19
  Administered 2014-02-19: 1000 ug via INTRAMUSCULAR

## 2014-03-03 ENCOUNTER — Encounter: Payer: Self-pay | Admitting: Hematology

## 2014-03-06 ENCOUNTER — Telehealth: Payer: Self-pay | Admitting: Hematology and Oncology

## 2014-03-06 NOTE — Telephone Encounter (Signed)
S/w pt to advise of appt time chg on 11/16 from 9.30 with [facility]G to 11am with [facility] due to [facility] meeting. Pt verbalized understanding.

## 2014-03-14 ENCOUNTER — Other Ambulatory Visit: Payer: Self-pay

## 2014-03-14 DIAGNOSIS — D6489 Other specified anemias: Secondary | ICD-10-CM

## 2014-03-17 ENCOUNTER — Ambulatory Visit (HOSPITAL_BASED_OUTPATIENT_CLINIC_OR_DEPARTMENT_OTHER): Payer: Medicare HMO | Admitting: Hematology

## 2014-03-17 ENCOUNTER — Other Ambulatory Visit (HOSPITAL_BASED_OUTPATIENT_CLINIC_OR_DEPARTMENT_OTHER): Payer: Medicare HMO

## 2014-03-17 ENCOUNTER — Ambulatory Visit (HOSPITAL_BASED_OUTPATIENT_CLINIC_OR_DEPARTMENT_OTHER): Payer: Medicare HMO

## 2014-03-17 ENCOUNTER — Ambulatory Visit: Payer: Self-pay | Admitting: Oncology

## 2014-03-17 ENCOUNTER — Telehealth: Payer: Self-pay | Admitting: Hematology

## 2014-03-17 VITALS — BP 145/85 | HR 95 | Temp 98.5°F | Resp 20 | Ht 66.0 in | Wt 239.8 lb

## 2014-03-17 DIAGNOSIS — D649 Anemia, unspecified: Secondary | ICD-10-CM

## 2014-03-17 DIAGNOSIS — D509 Iron deficiency anemia, unspecified: Secondary | ICD-10-CM

## 2014-03-17 DIAGNOSIS — D538 Other specified nutritional anemias: Secondary | ICD-10-CM

## 2014-03-17 DIAGNOSIS — E1165 Type 2 diabetes mellitus with hyperglycemia: Secondary | ICD-10-CM

## 2014-03-17 DIAGNOSIS — D6489 Other specified anemias: Secondary | ICD-10-CM

## 2014-03-17 LAB — CBC WITH DIFFERENTIAL/PLATELET
BASO%: 0.8 % (ref 0.0–2.0)
BASOS ABS: 0 10*3/uL (ref 0.0–0.1)
EOS ABS: 0.1 10*3/uL (ref 0.0–0.5)
EOS%: 1.7 % (ref 0.0–7.0)
HCT: 36.6 % (ref 34.8–46.6)
HGB: 11.6 g/dL (ref 11.6–15.9)
LYMPH%: 25.3 % (ref 14.0–49.7)
MCH: 30.5 pg (ref 25.1–34.0)
MCHC: 31.8 g/dL (ref 31.5–36.0)
MCV: 96.1 fL (ref 79.5–101.0)
MONO#: 0.3 10*3/uL (ref 0.1–0.9)
MONO%: 7.2 % (ref 0.0–14.0)
NEUT%: 65 % (ref 38.4–76.8)
NEUTROS ABS: 3 10*3/uL (ref 1.5–6.5)
Platelets: 235 10*3/uL (ref 145–400)
RBC: 3.81 10*6/uL (ref 3.70–5.45)
RDW: 14 % (ref 11.2–14.5)
WBC: 4.6 10*3/uL (ref 3.9–10.3)
lymph#: 1.2 10*3/uL (ref 0.9–3.3)

## 2014-03-17 LAB — COMPREHENSIVE METABOLIC PANEL (CC13)
ALK PHOS: 82 U/L (ref 40–150)
ALT: 20 U/L (ref 0–55)
AST: 18 U/L (ref 5–34)
Albumin: 3.9 g/dL (ref 3.5–5.0)
Anion Gap: 7 mEq/L (ref 3–11)
BILIRUBIN TOTAL: 0.38 mg/dL (ref 0.20–1.20)
BUN: 11.7 mg/dL (ref 7.0–26.0)
CO2: 25 mEq/L (ref 22–29)
Calcium: 9.5 mg/dL (ref 8.4–10.4)
Chloride: 106 mEq/L (ref 98–109)
Creatinine: 0.9 mg/dL (ref 0.6–1.1)
Glucose: 168 mg/dl — ABNORMAL HIGH (ref 70–140)
Potassium: 4.2 mEq/L (ref 3.5–5.1)
SODIUM: 139 meq/L (ref 136–145)
Total Protein: 6.7 g/dL (ref 6.4–8.3)

## 2014-03-17 LAB — IRON AND TIBC CHCC
%SAT: 35 % (ref 21–57)
Iron: 92 ug/dL (ref 41–142)
TIBC: 263 ug/dL (ref 236–444)
UIBC: 171 ug/dL (ref 120–384)

## 2014-03-17 LAB — FERRITIN CHCC: Ferritin: 167 ng/ml (ref 9–269)

## 2014-03-17 LAB — VITAMIN B12: VITAMIN B 12: 632 pg/mL (ref 211–911)

## 2014-03-17 MED ORDER — CYANOCOBALAMIN 1000 MCG/ML IJ SOLN
1000.0000 ug | Freq: Once | INTRAMUSCULAR | Status: AC
  Administered 2014-03-17: 1000 ug via INTRAMUSCULAR

## 2014-03-17 NOTE — Progress Notes (Signed)
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   #2 She had iron infusions in past for a low Ferritin, last Feraheme infusion was 12/03/2013.  CURRENT THERAPY: B12 injections every month and iron infuion prn  INTERVAL HISTORY:  Gennette Pac 58 y.o. female returns for followup visit today. She denies having any bleeding problems she has no hematuria hematochezia melena hemoptysis or hematemesis. She denies having any paresthesias or difficult walking. She has no myalgias or arthralgias. The patient underwent a gastric bypass surgery 9 years ago and that is the reason for her malabsorption and some nutritional deficiencies. She's been maintained on B12 shots on a monthly basis although she states it that it does not make much of a difference in how she feels. She also has chronic constipation currently she is taking 3 medications to address that including MiraLAX, emetiza and Senokot 3 times a day. Despite this she get 1 bowel movement every day.  She did receive iron infusion on December 03 2013. Her gastroenterologist attempted a colonoscopy 3 months ago but she had so much still impacted that he cannot complete the procedure and another one has been scheduled in August before this she also had 2 previous colonoscopies and history of colon polyps which were benign. When her primary care physician check CBC her hemoglobin was 10.6 and she was referred here. Today we also did a CBC, iron studies and some other tests for anemia. In terms of her GI workup she should get a complete colonoscopy and consideration for a capsule study. She also had a upper endoscopy not too long ago where no lesions were detected but  she had an esophageal stricture/narrowing which was dilated. She does complain of feeling tired all the time she does not consume a lot of soda. Her blood sugar today was 450 and I'm of the concern that this poorly controlled diabetes can be contributing to gastroparesis.  She had a right knee arthroscopy done 2 months ago. She will continue the b12 injections monthly and we are waiting for her iron studies.  Remainder of the 10 point review of systems is negative.  MEDICAL HISTORY: Past Medical History  Diagnosis Date  . Anxiety   . Diabetes mellitus   . GERD (gastroesophageal reflux disease)   . Hyperlipidemia   . Hypertension   . Anal fissure   . Arthritis   . Chronic headaches   . Colon polyps   . Depression   . Gallstones   . IBS (irritable bowel syndrome)   . Obesity   . Status post cardiac catheterization   . Anemia 03/17/2011  . Osteopenia 07/2011    t score -1.5 FRAX 2.1%/0%  . Vertigo     Occurs 6 times a month  . Bursitis of hip   . Esophageal spasm   . Neuropathy   . Breast cancer     ALLERGIES:  is allergic to chicken allergy; morphine and related; codeine; corn-containing products; other; peanut-containing drug products; sesame oil; simvastatin; and wheat.  MEDICATIONS:  Current Outpatient Prescriptions  Medication Sig Dispense Refill  . ALPRAZolam (XANAX) 0.5 MG tablet Take 1 tablet by mouth 2 (two) times daily as needed.  2  . AMITIZA 24 MCG capsule Take 1 capsule  by mouth 2 (two) times daily in the am and at bedtime..    . amitriptyline (ELAVIL) 10 MG tablet Take 50 mg by mouth at bedtime.     Marland Kitchen aspirin-acetaminophen-caffeine (EXCEDRIN MIGRAINE) 250-250-65 MG per tablet Take 1 tablet by mouth every 6 (six) hours as needed.    . clobetasol (TEMOVATE) 0.05 % external solution Use as directed 25 mL 0  . clobetasol ointment (TEMOVATE) 0.05 % 2 (two) times daily. On scalp bid  2  . cyanocobalamin (,VITAMIN B-12,) 1000 MCG/ML injection Inject 1,000 mcg into the  muscle every 30 (thirty) days.      . cyclobenzaprine (FLEXERIL) 10 MG tablet Take 10 mg by mouth 3 (three) times daily as needed.    . enalapril (VASOTEC) 20 MG tablet Take 1 tablet (20 mg total) by mouth daily. 90 tablet 3  . EPIPEN 2-PAK 0.3 MG/0.3ML DEVI as needed.    . fluticasone (FLONASE) 50 MCG/ACT nasal spray Place 2 sprays into both nostrils 2 (two) times daily.    . furosemide (LASIX) 20 MG tablet Take 1 tablet (20 mg total) by mouth 2 (two) times daily. 180 tablet 3  . hydrocortisone (PROCTOSOL HC) 2.5 % rectal cream Place 1 application rectally as needed. 30 g 0  . Liraglutide (VICTOZA [place]) Inject 1.2 mg into the skin daily.     . meclizine (ANTIVERT) 25 MG tablet Take 25 mg by mouth 3 (three) times daily as needed.      . meloxicam (MOBIC) 15 MG tablet Take 1 tablet by mouth daily.  1  . metFORMIN (GLUCOPHAGE) 500 MG tablet Take 500 mg by mouth 2 (two) times daily with a meal.     . polyethylene glycol powder (GLYCOLAX/MIRALAX) powder Take 17 g by mouth 2 (two) times daily. 255 g 0  . rosuvastatin (CRESTOR) 10 MG tablet Take 5 mg by mouth daily.     . traMADol (ULTRAM) 50 MG tablet Take 50 mg by mouth 3 (three) times daily.    . traZODone (DESYREL) 100 MG tablet 1& 1/2 TABLETS BY MOUTH AT BEDTIME 135 tablet 1  . verapamil (CALAN) 80 MG tablet Take 1 tablet (80 mg total) by mouth daily. 90 tablet 3   No current facility-administered medications for this visit.   Facility-Administered Medications Ordered in Other Visits  Medication Dose Route Frequency Provider Last Rate Last Dose  . cyanocobalamin ((VITAMIN B-12)) injection 1,000 mcg  1,000 mcg Intramuscular Q28 days Deatra Robinson, MD        SURGICAL HISTORY:  Past Surgical History  Procedure Laterality Date  . Appendectomy    . Cholecystectomy    . Gastric bypass    . Lower back surgery    . Left shouler surgery    . Tubal ligation    . Bilareral knee scopes    . Carpal tunnel release      REVIEW OF SYSTEMS:   Pertinent items are noted in HPI.   PHYSICAL EXAMINATION: General appearance: alert, cooperative and appears stated age Resp: clear to auscultation bilaterally and normal percussion bilaterally Back: symmetric, no curvature. ROM normal. No CVA tenderness. Cardio: regular rate and rhythm, S1, S2 normal, no murmur, click, rub or gallop GI: soft, non-tender; bowel sounds normal; no masses,  no organomegaly Extremities: extremities normal, atraumatic, no cyanosis or edema Neurologic: Alert and oriented X 3, normal strength and tone. Normal symmetric reflexes. Normal coordination and gait  ECOG PERFORMANCE STATUS: 1 - Symptomatic but completely ambulatory  Blood pressure 145/85, pulse  95, temperature 98.5 F (36.9 C), temperature source Oral, resp. rate 20, height 5\' 6"  (1.676 m), weight 239 lb 12.8 oz (108.773 kg), last menstrual period 05/02/2006, SpO2 100 %.  LABORATORY DATA: Lab Results  Component Value Date   WBC 4.6 03/17/2014   HGB 11.6 03/17/2014   HCT 36.6 03/17/2014   MCV 96.1 03/17/2014   PLT 235 03/17/2014      Chemistry      Component Value Date/Time   NA 139 03/17/2014 1119   NA 139 04/15/2011 1017   K 4.2 03/17/2014 1119   K 4.2 04/15/2011 1017   CL 104 04/15/2011 1017   CO2 25 03/17/2014 1119   CO2 26 04/15/2011 1017   BUN 11.7 03/17/2014 1119   BUN 11 04/15/2011 1017   CREATININE 0.9 03/17/2014 1119   CREATININE 0.80 04/15/2011 1017      Component Value Date/Time   CALCIUM 9.5 03/17/2014 1119   CALCIUM 9.2 04/15/2011 1017   ALKPHOS 82 03/17/2014 1119   ALKPHOS 82 04/15/2011 1017   AST 18 03/17/2014 1119   AST 17 04/15/2011 1017   ALT 20 03/17/2014 1119   ALT 10 04/15/2011 1017   BILITOT 0.38 03/17/2014 1119   BILITOT 0.6 04/15/2011 1017              Shown above Graph for her H/H       RADIOGRAPHIC STUDIES:  Mammogram 01/25/12 Negative for malignancy BIRADS category 1.  ASSESSMENT:   1. 58 year old female with anemia do to B12 and  iron deficiency. She will continue to receive B12 injections on a monthly basis. We will give her parenteral iron as needed for hypoferritnemia and  Anemia.  2. It is is possible that her anemia is multifactorial and we checked copper, zinc, vitamin C values which were normal.   3. her iron studies from today indicated low-fat and and low iron saturation, we will arrange for her to receive Feraheme infusion.  4. she had an upper endoscopy, a colonoscopy done, and she can benefit from a capsule endoscopy also known as PillCam study if she shows persistent anemia and iron deficiency profile.   PLAN:   #1 Patient will continue to receive B12 injections on a monthly basis here at the [facility].  #2 We will call her with the results of studies b12, folate, copper, zinc and vitamin C which were normal.  #3 I will plan on seeing her back in November for f/u at which time we will do iron studies as well as B12 levels.  #4 She got Feraheme infusion on 12/03/2013. Start taking Vitamin C to help iron absorption esp in background of Gastric bypass surgery.  #5 Follow up in 3 months with Dr Burr Medico and labs CBC, CMP, iron studies and ferritin.  All questions were answered. The patient knows to call the clinic with any problems, questions or concerns. We can certainly see the patient much sooner if necessary.  I spent 10 minutes counseling the patient face to face. The total time spent in the appointment was 15 minutes.   Bernadene Bell, MD Medical Hematologist/Oncologist [facility] Pager: [phone] Office No: [phone]  03/17/2014, 12:25 PM

## 2014-03-17 NOTE — Patient Instructions (Signed)

## 2014-03-17 NOTE — Telephone Encounter (Signed)
Pt confirmed labs/ov per 11/16 POF, gave pt AVS.... KJ °

## 2014-04-14 ENCOUNTER — Ambulatory Visit (HOSPITAL_BASED_OUTPATIENT_CLINIC_OR_DEPARTMENT_OTHER): Payer: Medicare HMO

## 2014-04-14 DIAGNOSIS — E538 Deficiency of other specified B group vitamins: Secondary | ICD-10-CM

## 2014-04-14 DIAGNOSIS — D510 Vitamin B12 deficiency anemia due to intrinsic factor deficiency: Secondary | ICD-10-CM

## 2014-04-14 MED ORDER — CYANOCOBALAMIN 1000 MCG/ML IJ SOLN
1000.0000 ug | Freq: Once | INTRAMUSCULAR | Status: AC
  Administered 2014-04-14: 1000 ug via INTRAMUSCULAR

## 2014-04-14 NOTE — Patient Instructions (Signed)

## 2014-05-12 ENCOUNTER — Ambulatory Visit (HOSPITAL_BASED_OUTPATIENT_CLINIC_OR_DEPARTMENT_OTHER): Payer: Medicare HMO

## 2014-05-12 DIAGNOSIS — D649 Anemia, unspecified: Secondary | ICD-10-CM

## 2014-05-12 DIAGNOSIS — D510 Vitamin B12 deficiency anemia due to intrinsic factor deficiency: Secondary | ICD-10-CM

## 2014-05-12 DIAGNOSIS — E538 Deficiency of other specified B group vitamins: Secondary | ICD-10-CM

## 2014-05-12 MED ORDER — CYANOCOBALAMIN 1000 MCG/ML IJ SOLN
1000.0000 ug | Freq: Once | INTRAMUSCULAR | Status: AC
  Administered 2014-05-12: 1000 ug via INTRAMUSCULAR

## 2014-06-09 ENCOUNTER — Ambulatory Visit (HOSPITAL_BASED_OUTPATIENT_CLINIC_OR_DEPARTMENT_OTHER): Payer: Medicare HMO | Admitting: Hematology

## 2014-06-09 ENCOUNTER — Ambulatory Visit: Payer: Self-pay

## 2014-06-09 ENCOUNTER — Other Ambulatory Visit: Payer: Self-pay

## 2014-06-09 ENCOUNTER — Ambulatory Visit (HOSPITAL_BASED_OUTPATIENT_CLINIC_OR_DEPARTMENT_OTHER): Payer: Medicare HMO

## 2014-06-09 ENCOUNTER — Telehealth: Payer: Self-pay | Admitting: Hematology

## 2014-06-09 ENCOUNTER — Encounter: Payer: Self-pay | Admitting: Hematology

## 2014-06-09 ENCOUNTER — Other Ambulatory Visit (HOSPITAL_BASED_OUTPATIENT_CLINIC_OR_DEPARTMENT_OTHER): Payer: Medicare HMO

## 2014-06-09 VITALS — BP 112/80 | HR 88 | Temp 98.6°F | Resp 18 | Ht 66.0 in | Wt 233.7 lb

## 2014-06-09 DIAGNOSIS — D519 Vitamin B12 deficiency anemia, unspecified: Secondary | ICD-10-CM

## 2014-06-09 DIAGNOSIS — D510 Vitamin B12 deficiency anemia due to intrinsic factor deficiency: Secondary | ICD-10-CM

## 2014-06-09 DIAGNOSIS — D6489 Other specified anemias: Secondary | ICD-10-CM

## 2014-06-09 DIAGNOSIS — D509 Iron deficiency anemia, unspecified: Secondary | ICD-10-CM

## 2014-06-09 DIAGNOSIS — Z9884 Bariatric surgery status: Secondary | ICD-10-CM

## 2014-06-09 LAB — CBC & DIFF AND RETIC
BASO%: 0.3 % (ref 0.0–2.0)
Basophils Absolute: 0 10*3/uL (ref 0.0–0.1)
EOS ABS: 0.1 10*3/uL (ref 0.0–0.5)
EOS%: 1.4 % (ref 0.0–7.0)
HCT: 36.6 % (ref 34.8–46.6)
HGB: 11.8 g/dL (ref 11.6–15.9)
Immature Retic Fract: 2.6 % (ref 1.60–10.00)
LYMPH#: 2 10*3/uL (ref 0.9–3.3)
LYMPH%: 32.2 % (ref 14.0–49.7)
MCH: 30.9 pg (ref 25.1–34.0)
MCHC: 32.2 g/dL (ref 31.5–36.0)
MCV: 95.8 fL (ref 79.5–101.0)
MONO#: 0.4 10*3/uL (ref 0.1–0.9)
MONO%: 6.4 % (ref 0.0–14.0)
NEUT%: 59.7 % (ref 38.4–76.8)
NEUTROS ABS: 3.7 10*3/uL (ref 1.5–6.5)
NRBC: 0 % (ref 0–0)
PLATELETS: 220 10*3/uL (ref 145–400)
RBC: 3.82 10*6/uL (ref 3.70–5.45)
RDW: 13.2 % (ref 11.2–14.5)
RETIC CT ABS: 53.86 10*3/uL (ref 33.70–90.70)
Retic %: 1.41 % (ref 0.70–2.10)
WBC: 6.2 10*3/uL (ref 3.9–10.3)

## 2014-06-09 LAB — COMPREHENSIVE METABOLIC PANEL (CC13)
ALT: 14 U/L (ref 0–55)
ANION GAP: 10 meq/L (ref 3–11)
AST: 19 U/L (ref 5–34)
Albumin: 3.8 g/dL (ref 3.5–5.0)
Alkaline Phosphatase: 86 U/L (ref 40–150)
BUN: 8.6 mg/dL (ref 7.0–26.0)
CO2: 26 meq/L (ref 22–29)
Calcium: 8.9 mg/dL (ref 8.4–10.4)
Chloride: 104 mEq/L (ref 98–109)
Creatinine: 0.9 mg/dL (ref 0.6–1.1)
EGFR: 86 mL/min/{1.73_m2} — ABNORMAL LOW (ref >90)
GLUCOSE: 104 mg/dL (ref 70–140)
POTASSIUM: 3.9 meq/L (ref 3.5–5.1)
SODIUM: 141 meq/L (ref 136–145)
Total Bilirubin: 0.33 mg/dL (ref 0.20–1.20)
Total Protein: 6.6 g/dL (ref 6.4–8.3)

## 2014-06-09 LAB — IRON AND TIBC CHCC
%SAT: 29 % (ref 21–57)
Iron: 74 ug/dL (ref 41–142)
TIBC: 256 ug/dL (ref 236–444)
UIBC: 182 ug/dL (ref 120–384)

## 2014-06-09 LAB — FERRITIN CHCC: FERRITIN: 133 ng/mL (ref 9–269)

## 2014-06-09 MED ORDER — CYANOCOBALAMIN 1000 MCG/ML IJ SOLN
1000.0000 ug | Freq: Once | INTRAMUSCULAR | Status: AC
  Administered 2014-06-09: 1000 ug via INTRAMUSCULAR

## 2014-06-09 NOTE — Progress Notes (Signed)
[facility]AT[facility] OFFICE PROGRESS NOTE DATE OF SERVICE: 03/17/2014   [name], MD [address] [place] [address]  DIAGNOSIS: 59 year old female with iron deficiency and B12 deficiency associated anemia (s/p gastric bypass surgery)  PRIOR THERAPY:  #1 Patient has received B12 injections on a monthly basis.   #2 She had iron infusions in past for a low Ferritin, last Feraheme infusion was 12/03/2013.  CURRENT THERAPY: B12 injections every month and iron infuion prn  INTERVAL HISTORY:  Kayla Mayo 59 y.o. female returns for followup visit today. She was last seen by Dr. Lona Kettle 3 months ago, who has left the practice. She is doing well overall. She is getting monthly B12 injection. She denies any significant pain, neuropathy, or other complaints. She denies any signs of bleeding. She has moderate energy, eats well. Weight is stable.  MEDICAL HISTORY: Past Medical History  Diagnosis Date  . Anxiety   . Diabetes mellitus   . GERD (gastroesophageal reflux disease)   . Hyperlipidemia   . Hypertension   . Anal fissure   . Arthritis   . Chronic headaches   . Colon polyps   . Depression   . Gallstones   . IBS (irritable bowel syndrome)   . Obesity   . Status post cardiac catheterization   . Anemia 03/17/2011  . Osteopenia 07/2011    t score -1.5 FRAX 2.1%/0%  . Vertigo     Occurs 6 times a month  . Bursitis of hip   . Esophageal spasm   . Neuropathy   . Breast cancer     ALLERGIES:  is allergic to chicken allergy; morphine and related; codeine; corn-containing products; other; peanut-containing drug products; sesame oil; simvastatin; and wheat.  MEDICATIONS:  Current Outpatient Prescriptions  Medication Sig Dispense Refill  . ALPRAZolam (XANAX) 0.5 MG tablet Take 1 tablet by mouth 2 (two) times daily as needed.  2  . AMITIZA 24 MCG capsule Take 1 capsule by mouth 2 (two) times daily in the am and at bedtime..    . amitriptyline (ELAVIL)  10 MG tablet Take 50 mg by mouth at bedtime.     Marland Kitchen aspirin-acetaminophen-caffeine (EXCEDRIN MIGRAINE) 250-250-65 MG per tablet Take 1 tablet by mouth every 6 (six) hours as needed.    . clobetasol (TEMOVATE) 0.05 % external solution Use as directed 25 mL 0  . clobetasol ointment (TEMOVATE) 0.05 % 2 (two) times daily. On scalp bid  2  . cyanocobalamin (,VITAMIN B-12,) 1000 MCG/ML injection Inject 1,000 mcg into the muscle every 30 (thirty) days.      . cyclobenzaprine (FLEXERIL) 10 MG tablet Take 10 mg by mouth 3 (three) times daily as needed.    . enalapril (VASOTEC) 20 MG tablet Take 1 tablet (20 mg total) by mouth daily. 90 tablet 3  . EPIPEN 2-PAK 0.3 MG/0.3ML DEVI as needed.    . fluticasone (FLONASE) 50 MCG/ACT nasal spray Place 2 sprays into both nostrils 2 (two) times daily.    . furosemide (LASIX) 20 MG tablet Take 1 tablet (20 mg total) by mouth 2 (two) times daily. 180 tablet 3  . hydrocortisone (PROCTOSOL HC) 2.5 % rectal cream Place 1 application rectally as needed. 30 g 0  . Liraglutide (VICTOZA [place]) Inject 1.2 mg into the skin daily.     . meclizine (ANTIVERT) 25 MG tablet Take 25 mg by mouth 3 (three) times daily as needed.      . meloxicam (MOBIC) 15 MG tablet Take 1 tablet  by mouth daily.  1  . metFORMIN (GLUCOPHAGE) 500 MG tablet Take 500 mg by mouth 2 (two) times daily with a meal.     . polyethylene glycol powder (GLYCOLAX/MIRALAX) powder Take 17 g by mouth 2 (two) times daily. 255 g 0  . rosuvastatin (CRESTOR) 10 MG tablet Take 5 mg by mouth daily.     . traMADol (ULTRAM) 50 MG tablet Take 50 mg by mouth 3 (three) times daily.    . traZODone (DESYREL) 100 MG tablet 1& 1/2 TABLETS BY MOUTH AT BEDTIME 135 tablet 1  . verapamil (CALAN) 80 MG tablet Take 1 tablet (80 mg total) by mouth daily. 90 tablet 3   No current facility-administered medications for this visit.   Facility-Administered Medications Ordered in Other Visits  Medication Dose Route Frequency Provider Last Rate  Last Dose  . cyanocobalamin ((VITAMIN B-12)) injection 1,000 mcg  1,000 mcg Intramuscular Q28 days Deatra Robinson, MD        SURGICAL HISTORY:  Past Surgical History  Procedure Laterality Date  . Appendectomy    . Cholecystectomy    . Gastric bypass    . Lower back surgery    . Left shouler surgery    . Tubal ligation    . Bilareral knee scopes    . Carpal tunnel release      REVIEW OF SYSTEMS:  Pertinent items are noted in HPI.   PHYSICAL EXAMINATION: General appearance: alert, cooperative and appears stated age Resp: clear to auscultation bilaterally and normal percussion bilaterally Back: symmetric, no curvature. ROM normal. No CVA tenderness. Cardio: regular rate and rhythm, S1, S2 normal, no murmur, click, rub or gallop GI: soft, non-tender; bowel sounds normal; no masses,  no organomegaly Extremities: extremities normal, atraumatic, no cyanosis or edema Neurologic: Alert and oriented X 3, normal strength and tone. Normal symmetric reflexes. Normal coordination and gait  ECOG PERFORMANCE STATUS: 1 - Symptomatic but completely ambulatory  Last menstrual period 05/02/2006.  LABORATORY DATA: CBC Latest Ref Rng 06/09/2014 03/17/2014 11/28/2013  WBC 3.9 - 10.3 10e3/uL 6.2 4.6 4.7  Hemoglobin 11.6 - 15.9 g/dL 11.8 11.6 11.1(L)  Hematocrit 34.8 - 46.6 % 36.6 36.6 34.7(L)  Platelets 145 - 400 10e3/uL 220 235 267    CMP Latest Ref Rng 06/09/2014 03/17/2014 11/28/2013  Glucose 70 - 140 mg/dl 104 168(H) 450(H)  BUN 7.0 - 26.0 mg/dL 8.6 11.7 11.0  Creatinine 0.6 - 1.1 mg/dL 0.9 0.9 1.2(H)  Sodium 136 - 145 mEq/L 141 139 136  Potassium 3.5 - 5.1 mEq/L 3.9 4.2 4.4  Chloride 96 - 112 mEq/L - - -  CO2 22 - 29 mEq/L 26 25 23   Calcium 8.4 - 10.4 mg/dL 8.9 9.5 8.9  Total Protein 6.4 - 8.3 g/dL 6.6 6.7 6.6  Total Bilirubin 0.20 - 1.20 mg/dL 0.33 0.38 0.47  Alkaline Phos 40 - 150 U/L 86 82 103  AST 5 - 34 U/L 19 18 17   ALT 0 - 55 U/L 14 20 12       RADIOGRAPHIC STUDIES:  Mammogram  01/25/12 Negative for malignancy BIRADS category 1.  ASSESSMENT:   52. 59 year old female s/p gastric bypass surgery  1. Anemia secondary to B12 deficiency and iron deficiency, secondary to gastric bypass surgery -Continue monthly B12 injection, her last B12 level was normal at 330-2 months ago. -Continue IV Arron as needed if her ferritin level Fels below 100. Her ferritin level was 167 2 months ago. Today's level still pending. -Her hemoglobin has been very stable,  11.8 today. No need for blood transfusion.  2. Cancer screening -Her last mammogram was in October 2013. I encouraged her to have screening mammogram once a year.  3. She will continue follow-up with her primary care physician for other medical problems.  Follow-up: 4 months with repeat his lab. I'll schedule her monthly B12 injection 4 times.  Kayla Mayo  06/09/2014, 11:50 AM

## 2014-06-09 NOTE — Telephone Encounter (Signed)
gv and printed appt sched and avs for pt for March thru May °

## 2014-07-07 ENCOUNTER — Ambulatory Visit (HOSPITAL_BASED_OUTPATIENT_CLINIC_OR_DEPARTMENT_OTHER): Payer: Medicare HMO

## 2014-07-07 VITALS — BP 119/64 | HR 83 | Temp 99.1°F

## 2014-07-07 DIAGNOSIS — D519 Vitamin B12 deficiency anemia, unspecified: Secondary | ICD-10-CM

## 2014-07-07 DIAGNOSIS — D6489 Other specified anemias: Secondary | ICD-10-CM

## 2014-07-07 DIAGNOSIS — D649 Anemia, unspecified: Secondary | ICD-10-CM

## 2014-07-07 DIAGNOSIS — D518 Other vitamin B12 deficiency anemias: Secondary | ICD-10-CM

## 2014-07-07 DIAGNOSIS — D510 Vitamin B12 deficiency anemia due to intrinsic factor deficiency: Secondary | ICD-10-CM

## 2014-07-07 MED ORDER — CYANOCOBALAMIN 1000 MCG/ML IJ SOLN
1000.0000 ug | Freq: Once | INTRAMUSCULAR | Status: AC
  Administered 2014-07-07: 1000 ug via INTRAMUSCULAR

## 2014-08-04 ENCOUNTER — Ambulatory Visit (HOSPITAL_BASED_OUTPATIENT_CLINIC_OR_DEPARTMENT_OTHER): Payer: Medicare HMO

## 2014-08-04 DIAGNOSIS — D519 Vitamin B12 deficiency anemia, unspecified: Secondary | ICD-10-CM | POA: Diagnosis not present

## 2014-08-04 DIAGNOSIS — D510 Vitamin B12 deficiency anemia due to intrinsic factor deficiency: Secondary | ICD-10-CM

## 2014-08-04 DIAGNOSIS — D509 Iron deficiency anemia, unspecified: Secondary | ICD-10-CM

## 2014-08-04 MED ORDER — CYANOCOBALAMIN 1000 MCG/ML IJ SOLN
1000.0000 ug | Freq: Once | INTRAMUSCULAR | Status: AC
  Administered 2014-08-04: 1000 ug via INTRAMUSCULAR

## 2014-09-01 ENCOUNTER — Ambulatory Visit (HOSPITAL_BASED_OUTPATIENT_CLINIC_OR_DEPARTMENT_OTHER): Payer: Medicare HMO

## 2014-09-01 VITALS — BP 139/82 | HR 83 | Temp 100.2°F | Resp 18

## 2014-09-01 DIAGNOSIS — D510 Vitamin B12 deficiency anemia due to intrinsic factor deficiency: Secondary | ICD-10-CM

## 2014-09-01 MED ORDER — CYANOCOBALAMIN 1000 MCG/ML IJ SOLN
1000.0000 ug | Freq: Once | INTRAMUSCULAR | Status: AC
  Administered 2014-09-01: 1000 ug via INTRAMUSCULAR

## 2014-09-01 NOTE — Patient Instructions (Signed)

## 2014-09-30 ENCOUNTER — Ambulatory Visit (HOSPITAL_BASED_OUTPATIENT_CLINIC_OR_DEPARTMENT_OTHER): Payer: Medicare HMO

## 2014-09-30 ENCOUNTER — Telehealth: Payer: Self-pay | Admitting: Hematology

## 2014-09-30 ENCOUNTER — Ambulatory Visit (HOSPITAL_BASED_OUTPATIENT_CLINIC_OR_DEPARTMENT_OTHER): Payer: Medicare HMO | Admitting: Hematology

## 2014-09-30 ENCOUNTER — Other Ambulatory Visit (HOSPITAL_BASED_OUTPATIENT_CLINIC_OR_DEPARTMENT_OTHER): Payer: Medicare HMO

## 2014-09-30 ENCOUNTER — Encounter: Payer: Self-pay | Admitting: Hematology

## 2014-09-30 VITALS — BP 120/77 | HR 92 | Temp 98.5°F | Resp 18 | Ht 66.0 in | Wt 226.5 lb

## 2014-09-30 DIAGNOSIS — D500 Iron deficiency anemia secondary to blood loss (chronic): Secondary | ICD-10-CM | POA: Diagnosis not present

## 2014-09-30 DIAGNOSIS — D519 Vitamin B12 deficiency anemia, unspecified: Secondary | ICD-10-CM

## 2014-09-30 DIAGNOSIS — D6489 Other specified anemias: Secondary | ICD-10-CM

## 2014-09-30 DIAGNOSIS — D518 Other vitamin B12 deficiency anemias: Secondary | ICD-10-CM

## 2014-09-30 DIAGNOSIS — Z9884 Bariatric surgery status: Secondary | ICD-10-CM

## 2014-09-30 DIAGNOSIS — D510 Vitamin B12 deficiency anemia due to intrinsic factor deficiency: Secondary | ICD-10-CM

## 2014-09-30 DIAGNOSIS — D509 Iron deficiency anemia, unspecified: Secondary | ICD-10-CM

## 2014-09-30 LAB — CBC & DIFF AND RETIC
BASO%: 0.5 % (ref 0.0–2.0)
BASOS ABS: 0 10*3/uL (ref 0.0–0.1)
EOS%: 1.1 % (ref 0.0–7.0)
Eosinophils Absolute: 0.1 10*3/uL (ref 0.0–0.5)
HEMATOCRIT: 36.7 % (ref 34.8–46.6)
HEMOGLOBIN: 11.8 g/dL (ref 11.6–15.9)
IMMATURE RETIC FRACT: 7.4 % (ref 1.60–10.00)
LYMPH#: 1.4 10*3/uL (ref 0.9–3.3)
LYMPH%: 24.4 % (ref 14.0–49.7)
MCH: 30.9 pg (ref 25.1–34.0)
MCHC: 32.2 g/dL (ref 31.5–36.0)
MCV: 96.1 fL (ref 79.5–101.0)
MONO#: 0.3 10*3/uL (ref 0.1–0.9)
MONO%: 5.3 % (ref 0.0–14.0)
NEUT#: 3.9 10*3/uL (ref 1.5–6.5)
NEUT%: 68.7 % (ref 38.4–76.8)
Platelets: 247 10*3/uL (ref 145–400)
RBC: 3.82 10*6/uL (ref 3.70–5.45)
RDW: 13.6 % (ref 11.2–14.5)
RETIC CT ABS: 68.38 10*3/uL (ref 33.70–90.70)
Retic %: 1.79 % (ref 0.70–2.10)
WBC: 5.6 10*3/uL (ref 3.9–10.3)
nRBC: 0 % (ref 0–0)

## 2014-09-30 LAB — COMPREHENSIVE METABOLIC PANEL (CC13)
ALBUMIN: 3.6 g/dL (ref 3.5–5.0)
ALT: 17 U/L (ref 0–55)
AST: 18 U/L (ref 5–34)
Alkaline Phosphatase: 93 U/L (ref 40–150)
Anion Gap: 9 mEq/L (ref 3–11)
BUN: 11.2 mg/dL (ref 7.0–26.0)
CHLORIDE: 105 meq/L (ref 98–109)
CO2: 26 mEq/L (ref 22–29)
Calcium: 9 mg/dL (ref 8.4–10.4)
Creatinine: 0.9 mg/dL (ref 0.6–1.1)
EGFR: 77 mL/min/{1.73_m2} — AB (ref >90)
GLUCOSE: 251 mg/dL — AB (ref 70–140)
POTASSIUM: 4.7 meq/L (ref 3.5–5.1)
Sodium: 140 mEq/L (ref 136–145)
TOTAL PROTEIN: 6.6 g/dL (ref 6.4–8.3)
Total Bilirubin: 0.52 mg/dL (ref 0.20–1.20)

## 2014-09-30 LAB — IRON AND TIBC CHCC
%SAT: 35 % (ref 21–57)
Iron: 90 ug/dL (ref 41–142)
TIBC: 260 ug/dL (ref 236–444)
UIBC: 170 ug/dL (ref 120–384)

## 2014-09-30 LAB — FERRITIN CHCC: FERRITIN: 119 ng/mL (ref 9–269)

## 2014-09-30 MED ORDER — CYANOCOBALAMIN 1000 MCG/ML IJ SOLN
1000.0000 ug | Freq: Once | INTRAMUSCULAR | Status: AC
  Administered 2014-09-30: 1000 ug via INTRAMUSCULAR

## 2014-09-30 NOTE — Telephone Encounter (Signed)
Pt confirmed labs/ov/inj per 05/31 POF, gave pt AVS and Calendar..... KJ

## 2014-09-30 NOTE — Progress Notes (Signed)
[facility] PROGRESS NOTE DATE OF SERVICE: 03/17/2014   [name], MD [address] [place] [address]  DIAGNOSIS: 59 year old female with iron deficiency and B12 deficiency associated anemia (s/p gastric bypass surgery)  PRIOR THERAPY:  #1 Patient has received B12 injections on a monthly basis.   #2 She had iron infusions in past for a low Ferritin, last Feraheme infusion was 12/03/2013.  CURRENT THERAPY: B12 injections every month and iron infuion prn  INTERVAL HISTORY:  Gennette Pac 59 y.o. female returns for followup visit today. She was last seen by me 3 month ago.  She is overall doing well, has mild to moderate fatigue which is unchanged, no chest pain, dyspnea, or other new complaints. She was started on Synthroid 3 months ago for hypothyroidism, has not noticed any change since she started. She is getting B-12 injection monthly, no other new events since her last visit.  MEDICAL HISTORY: Past Medical History  Diagnosis Date  . Anxiety   . Diabetes mellitus   . GERD (gastroesophageal reflux disease)   . Hyperlipidemia   . Hypertension   . Anal fissure   . Arthritis   . Chronic headaches   . Colon polyps   . Depression   . Gallstones   . IBS (irritable bowel syndrome)   . Obesity   . Status post cardiac catheterization   . Anemia 03/17/2011  . Osteopenia 07/2011    t score -1.5 FRAX 2.1%/0%  . Vertigo     Occurs 6 times a month  . Bursitis of hip   . Esophageal spasm   . Neuropathy   . Breast cancer     ALLERGIES:  is allergic to chicken allergy; morphine and related; codeine; corn-containing products; other; peanut-containing drug products; sesame oil; simvastatin; and wheat.  MEDICATIONS:  Current Outpatient Prescriptions  Medication Sig Dispense Refill  . ALPRAZolam (XANAX) 0.5 MG tablet Take 1 tablet by mouth 2 (two) times daily as needed.  2  . AMITIZA 24 MCG capsule Take 1 capsule by mouth 3 (three) times  daily.     Marland Kitchen amitriptyline (ELAVIL) 10 MG tablet Take 50 mg by mouth at bedtime.     Marland Kitchen aspirin-acetaminophen-caffeine (EXCEDRIN MIGRAINE) 250-250-65 MG per tablet Take 1 tablet by mouth every 6 (six) hours as needed.    . clobetasol ointment (TEMOVATE) 0.05 % 2 (two) times daily. On scalp bid  2  . cyanocobalamin (,VITAMIN B-12,) 1000 MCG/ML injection Inject 1,000 mcg into the muscle every 30 (thirty) days.      . cyclobenzaprine (FLEXERIL) 10 MG tablet Take 10 mg by mouth 3 (three) times daily as needed.    . enalapril (VASOTEC) 20 MG tablet Take 1 tablet (20 mg total) by mouth daily. 90 tablet 3  . fluticasone (FLONASE) 50 MCG/ACT nasal spray Place 2 sprays into both nostrils 2 (two) times daily.    . furosemide (LASIX) 20 MG tablet Take 1 tablet (20 mg total) by mouth 2 (two) times daily. 180 tablet 3  . hydrocortisone (PROCTOSOL HC) 2.5 % rectal cream Place 1 application rectally as needed. 30 g 0  . levothyroxine (SYNTHROID, LEVOTHROID) 50 MCG tablet Take 50 mcg by mouth daily before breakfast.    . Liraglutide (VICTOZA [place]) Inject 1.2 mg into the skin daily.     . meclizine (ANTIVERT) 25 MG tablet Take 25 mg by mouth 3 (three) times daily as needed.      . meloxicam (MOBIC) 15 MG tablet Take 1  tablet by mouth daily.  1  . metFORMIN (GLUCOPHAGE) 500 MG tablet Take 500 mg by mouth 2 (two) times daily with a meal.     . pantoprazole (PROTONIX) 20 MG tablet Take 20 mg by mouth daily.    . polyethylene glycol powder (GLYCOLAX/MIRALAX) powder Take 17 g by mouth 2 (two) times daily. 255 g 0  . rosuvastatin (CRESTOR) 10 MG tablet Take 5 mg by mouth daily.     . traMADol (ULTRAM) 50 MG tablet Take 50 mg by mouth 3 (three) times daily.    . traZODone (DESYREL) 100 MG tablet 1& 1/2 TABLETS BY MOUTH AT BEDTIME 135 tablet 1  . verapamil (CALAN) 80 MG tablet Take 1 tablet (80 mg total) by mouth daily. 90 tablet 3  . EPIPEN 2-PAK 0.3 MG/0.3ML DEVI as needed.     No current facility-administered  medications for this visit.   Facility-Administered Medications Ordered in Other Visits  Medication Dose Route Frequency Provider Last Rate Last Dose  . cyanocobalamin ((VITAMIN B-12)) injection 1,000 mcg  1,000 mcg Intramuscular Q28 days Consuela Mimes, MD        SURGICAL HISTORY:  Past Surgical History  Procedure Laterality Date  . Appendectomy    . Cholecystectomy    . Gastric bypass    . Lower back surgery    . Left shouler surgery    . Tubal ligation    . Bilareral knee scopes    . Carpal tunnel release      REVIEW OF SYSTEMS:  Pertinent items are noted in HPI.   PHYSICAL EXAMINATION: General appearance: alert, cooperative and appears stated age Resp: clear to auscultation bilaterally and normal percussion bilaterally Back: symmetric, no curvature. ROM normal. No CVA tenderness. Cardio: regular rate and rhythm, S1, S2 normal, no murmur, click, rub or gallop GI: soft, non-tender; bowel sounds normal; no masses,  no organomegaly Extremities: extremities normal, atraumatic, no cyanosis or edema Neurologic: Alert and oriented X 3, normal strength and tone. Normal symmetric reflexes. Normal coordination and gait  ECOG PERFORMANCE STATUS: 1 - Symptomatic but completely ambulatory  Blood pressure 120/77, pulse 92, temperature 98.5 F (36.9 C), temperature source Oral, resp. rate 18, height 5\' 6"  (1.676 m), weight 226 lb 8 oz (102.74 kg), last menstrual period 05/02/2006, SpO2 99 %.  LABORATORY DATA: CBC Latest Ref Rng 09/30/2014 06/09/2014 03/17/2014  WBC 3.9 - 10.3 10e3/uL 5.6 6.2 4.6  Hemoglobin 11.6 - 15.9 g/dL 11.8 11.8 11.6  Hematocrit 34.8 - 46.6 % 36.7 36.6 36.6  Platelets 145 - 400 10e3/uL 247 220 235    CMP Latest Ref Rng 09/30/2014 06/09/2014 03/17/2014  Glucose 70 - 140 mg/dl 251(H) 104 168(H)  BUN 7.0 - 26.0 mg/dL 11.2 8.6 11.7  Creatinine 0.6 - 1.1 mg/dL 0.9 0.9 0.9  Sodium 136 - 145 mEq/L 140 141 139  Potassium 3.5 - 5.1 mEq/L 4.7 3.9 4.2  Chloride 96 - 112 mEq/L  - - -  CO2 22 - 29 mEq/L 26 26 25   Calcium 8.4 - 10.4 mg/dL 9.0 8.9 9.5  Total Protein 6.4 - 8.3 g/dL 6.6 6.6 6.7  Total Bilirubin 0.20 - 1.20 mg/dL 0.52 0.33 0.38  Alkaline Phos 40 - 150 U/L 93 86 82  AST 5 - 34 U/L 18 19 18   ALT 0 - 55 U/L 17 14 20     Ferritin  Status: Finalresult Visible to patient:  MyChart Nextappt: Today at 11:00 AM in Oncology ([place]) Dx:  Anemia due to other cause  Ref Range 36mo ago  11mo ago  54mo ago     Ferritin 9 - 269 ng/ml 133 167 10        Iron and TIBC [facility]  Status: Finalresult Visible to patient:  MyChart Nextappt: Today at 11:00 AM in Oncology [facility] In[facility]) Dx:  Anemia due to other cause           Ref Range 54mo ago  58mo ago  1mo ago     Iron 41 - 142 ug/dL 74 92 59    TIBC 236 - 444 ug/dL 256 263 363    UIBC 120 - 384 ug/dL 182 171 304    %SAT 21 - 57 % 29 35 16 (L)          RADIOGRAPHIC STUDIES:  Mammogram 01/25/12 Negative for malignancy BIRADS category 1.  ASSESSMENT:    59 year old female s/p gastric bypass surgery  1. Anemia secondary to B12 deficiency and iron deficiency, secondary to gastric bypass surgery -Continue monthly B12 injection, her last B12 level was normal at 632 on 03/17/2014. -Continue IV Feraheme as needed if her ferritin level Fels below 100. Her ferritin level was 132 3 months ago. Today's level still pending. -Her hemoglobin has been very stable, 11.8 today. No need for blood transfusion.  2. Cancer screening -Her last mammogram was in October 2013. I encouraged her to have screening mammogram once a year.  3. She will continue follow-up with her primary care physician for other medical problems.  Follow-up: lab in 3 month and see me back in 6 months with repeat his lab. I'll schedule her monthly B12 injection 6 times.  Truitt Merle  09/30/2014, 11:11 AM

## 2014-10-30 ENCOUNTER — Ambulatory Visit (HOSPITAL_BASED_OUTPATIENT_CLINIC_OR_DEPARTMENT_OTHER): Payer: Medicare HMO

## 2014-10-30 VITALS — BP 119/76 | HR 80 | Temp 98.8°F

## 2014-10-30 DIAGNOSIS — D518 Other vitamin B12 deficiency anemias: Secondary | ICD-10-CM

## 2014-10-30 DIAGNOSIS — D510 Vitamin B12 deficiency anemia due to intrinsic factor deficiency: Secondary | ICD-10-CM

## 2014-10-30 DIAGNOSIS — D509 Iron deficiency anemia, unspecified: Secondary | ICD-10-CM

## 2014-10-30 MED ORDER — CYANOCOBALAMIN 1000 MCG/ML IJ SOLN
1000.0000 ug | Freq: Once | INTRAMUSCULAR | Status: AC
  Administered 2014-10-30: 1000 ug via INTRAMUSCULAR

## 2014-11-28 ENCOUNTER — Ambulatory Visit: Payer: Self-pay

## 2014-12-01 ENCOUNTER — Ambulatory Visit (HOSPITAL_BASED_OUTPATIENT_CLINIC_OR_DEPARTMENT_OTHER): Payer: Medicare HMO

## 2014-12-01 VITALS — BP 113/72 | HR 79 | Temp 99.1°F

## 2014-12-01 DIAGNOSIS — D509 Iron deficiency anemia, unspecified: Secondary | ICD-10-CM

## 2014-12-01 DIAGNOSIS — D518 Other vitamin B12 deficiency anemias: Secondary | ICD-10-CM

## 2014-12-01 DIAGNOSIS — D510 Vitamin B12 deficiency anemia due to intrinsic factor deficiency: Secondary | ICD-10-CM

## 2014-12-01 MED ORDER — CYANOCOBALAMIN 1000 MCG/ML IJ SOLN
1000.0000 ug | Freq: Once | INTRAMUSCULAR | Status: AC
  Administered 2014-12-01: 1000 ug via INTRAMUSCULAR

## 2014-12-29 ENCOUNTER — Ambulatory Visit (HOSPITAL_BASED_OUTPATIENT_CLINIC_OR_DEPARTMENT_OTHER): Payer: Medicare HMO

## 2014-12-29 VITALS — BP 134/80 | HR 78 | Temp 99.4°F

## 2014-12-29 DIAGNOSIS — D519 Vitamin B12 deficiency anemia, unspecified: Secondary | ICD-10-CM

## 2014-12-29 DIAGNOSIS — D510 Vitamin B12 deficiency anemia due to intrinsic factor deficiency: Secondary | ICD-10-CM

## 2014-12-29 DIAGNOSIS — D509 Iron deficiency anemia, unspecified: Secondary | ICD-10-CM

## 2014-12-29 MED ORDER — CYANOCOBALAMIN 1000 MCG/ML IJ SOLN
1000.0000 ug | Freq: Once | INTRAMUSCULAR | Status: AC
  Administered 2014-12-29: 1000 ug via INTRAMUSCULAR

## 2015-01-29 ENCOUNTER — Ambulatory Visit (HOSPITAL_BASED_OUTPATIENT_CLINIC_OR_DEPARTMENT_OTHER): Payer: Medicare HMO

## 2015-01-29 VITALS — BP 133/83 | HR 89 | Temp 99.2°F

## 2015-01-29 DIAGNOSIS — D519 Vitamin B12 deficiency anemia, unspecified: Secondary | ICD-10-CM | POA: Diagnosis not present

## 2015-01-29 DIAGNOSIS — D510 Vitamin B12 deficiency anemia due to intrinsic factor deficiency: Secondary | ICD-10-CM

## 2015-01-29 DIAGNOSIS — D509 Iron deficiency anemia, unspecified: Secondary | ICD-10-CM

## 2015-01-29 MED ORDER — CYANOCOBALAMIN 1000 MCG/ML IJ SOLN
1000.0000 ug | Freq: Once | INTRAMUSCULAR | Status: AC
  Administered 2015-01-29: 1000 ug via INTRAMUSCULAR

## 2015-02-27 ENCOUNTER — Ambulatory Visit (HOSPITAL_BASED_OUTPATIENT_CLINIC_OR_DEPARTMENT_OTHER): Payer: Medicare HMO

## 2015-02-27 VITALS — BP 124/82 | HR 81 | Temp 99.5°F

## 2015-02-27 DIAGNOSIS — D519 Vitamin B12 deficiency anemia, unspecified: Secondary | ICD-10-CM

## 2015-02-27 DIAGNOSIS — D6489 Other specified anemias: Secondary | ICD-10-CM

## 2015-02-27 DIAGNOSIS — D510 Vitamin B12 deficiency anemia due to intrinsic factor deficiency: Secondary | ICD-10-CM

## 2015-02-27 MED ORDER — CYANOCOBALAMIN 1000 MCG/ML IJ SOLN
1000.0000 ug | Freq: Once | INTRAMUSCULAR | Status: AC
  Administered 2015-02-27: 1000 ug via INTRAMUSCULAR

## 2015-03-30 ENCOUNTER — Other Ambulatory Visit (HOSPITAL_BASED_OUTPATIENT_CLINIC_OR_DEPARTMENT_OTHER): Payer: Medicare HMO

## 2015-03-30 ENCOUNTER — Ambulatory Visit (HOSPITAL_BASED_OUTPATIENT_CLINIC_OR_DEPARTMENT_OTHER): Payer: Medicare HMO

## 2015-03-30 VITALS — BP 130/84 | HR 79 | Temp 98.6°F

## 2015-03-30 DIAGNOSIS — D519 Vitamin B12 deficiency anemia, unspecified: Secondary | ICD-10-CM

## 2015-03-30 DIAGNOSIS — D6489 Other specified anemias: Secondary | ICD-10-CM

## 2015-03-30 DIAGNOSIS — D510 Vitamin B12 deficiency anemia due to intrinsic factor deficiency: Secondary | ICD-10-CM

## 2015-03-30 DIAGNOSIS — D509 Iron deficiency anemia, unspecified: Secondary | ICD-10-CM

## 2015-03-30 LAB — CBC & DIFF AND RETIC
BASO%: 0.5 % (ref 0.0–2.0)
Basophils Absolute: 0 10*3/uL (ref 0.0–0.1)
EOS%: 2.4 % (ref 0.0–7.0)
Eosinophils Absolute: 0.1 10*3/uL (ref 0.0–0.5)
HCT: 34.4 % — ABNORMAL LOW (ref 34.8–46.6)
HGB: 11.1 g/dL — ABNORMAL LOW (ref 11.6–15.9)
Immature Retic Fract: 6.3 % (ref 1.60–10.00)
LYMPH%: 29.2 % (ref 14.0–49.7)
MCH: 30.7 pg (ref 25.1–34.0)
MCHC: 32.3 g/dL (ref 31.5–36.0)
MCV: 95.3 fL (ref 79.5–101.0)
MONO#: 0.2 10*3/uL (ref 0.1–0.9)
MONO%: 5.5 % (ref 0.0–14.0)
NEUT%: 62.4 % (ref 38.4–76.8)
NEUTROS ABS: 2.6 10*3/uL (ref 1.5–6.5)
Platelets: 222 10*3/uL (ref 145–400)
RBC: 3.61 10*6/uL — ABNORMAL LOW (ref 3.70–5.45)
RDW: 13.7 % (ref 11.2–14.5)
RETIC %: 2.36 % — AB (ref 0.70–2.10)
Retic Ct Abs: 85.2 10*3/uL (ref 33.70–90.70)
WBC: 4.2 10*3/uL (ref 3.9–10.3)
lymph#: 1.2 10*3/uL (ref 0.9–3.3)

## 2015-03-30 LAB — COMPREHENSIVE METABOLIC PANEL (CC13)
ALT: 13 U/L (ref 0–55)
AST: 19 U/L (ref 5–34)
Albumin: 3.6 g/dL (ref 3.5–5.0)
Alkaline Phosphatase: 87 U/L (ref 40–150)
Anion Gap: 8 mEq/L (ref 3–11)
BUN: 13.3 mg/dL (ref 7.0–26.0)
CALCIUM: 9 mg/dL (ref 8.4–10.4)
CO2: 26 mEq/L (ref 22–29)
Chloride: 105 mEq/L (ref 98–109)
Creatinine: 0.9 mg/dL (ref 0.6–1.1)
EGFR: 79 mL/min/{1.73_m2} — ABNORMAL LOW (ref >90)
Glucose: 200 mg/dl — ABNORMAL HIGH (ref 70–140)
POTASSIUM: 3.9 meq/L (ref 3.5–5.1)
SODIUM: 139 meq/L (ref 136–145)
Total Bilirubin: 0.45 mg/dL (ref 0.20–1.20)
Total Protein: 6.7 g/dL (ref 6.4–8.3)

## 2015-03-30 LAB — FERRITIN CHCC: FERRITIN: 87 ng/mL (ref 9–269)

## 2015-03-30 LAB — IRON AND TIBC CHCC
%SAT: 30 % (ref 21–57)
IRON: 75 ug/dL (ref 41–142)
TIBC: 249 ug/dL (ref 236–444)
UIBC: 174 ug/dL (ref 120–384)

## 2015-03-30 MED ORDER — CYANOCOBALAMIN 1000 MCG/ML IJ SOLN
1000.0000 ug | Freq: Once | INTRAMUSCULAR | Status: AC
  Administered 2015-03-30: 1000 ug via INTRAMUSCULAR

## 2015-04-06 ENCOUNTER — Ambulatory Visit (HOSPITAL_BASED_OUTPATIENT_CLINIC_OR_DEPARTMENT_OTHER): Payer: Medicare HMO

## 2015-04-06 ENCOUNTER — Encounter: Payer: Self-pay | Admitting: Hematology

## 2015-04-06 ENCOUNTER — Ambulatory Visit (HOSPITAL_BASED_OUTPATIENT_CLINIC_OR_DEPARTMENT_OTHER): Payer: Medicare HMO | Admitting: Hematology

## 2015-04-06 VITALS — BP 145/90 | HR 101 | Temp 99.0°F | Resp 19 | Ht 66.0 in | Wt 230.0 lb

## 2015-04-06 VITALS — BP 112/61 | HR 92 | Resp 16

## 2015-04-06 DIAGNOSIS — D509 Iron deficiency anemia, unspecified: Secondary | ICD-10-CM | POA: Diagnosis not present

## 2015-04-06 DIAGNOSIS — D518 Other vitamin B12 deficiency anemias: Secondary | ICD-10-CM

## 2015-04-06 DIAGNOSIS — D519 Vitamin B12 deficiency anemia, unspecified: Secondary | ICD-10-CM | POA: Diagnosis not present

## 2015-04-06 DIAGNOSIS — Z9884 Bariatric surgery status: Secondary | ICD-10-CM

## 2015-04-06 DIAGNOSIS — D6489 Other specified anemias: Secondary | ICD-10-CM

## 2015-04-06 DIAGNOSIS — E2839 Other primary ovarian failure: Secondary | ICD-10-CM

## 2015-04-06 DIAGNOSIS — D508 Other iron deficiency anemias: Secondary | ICD-10-CM

## 2015-04-06 DIAGNOSIS — R42 Dizziness and giddiness: Secondary | ICD-10-CM | POA: Diagnosis not present

## 2015-04-06 DIAGNOSIS — D531 Other megaloblastic anemias, not elsewhere classified: Secondary | ICD-10-CM

## 2015-04-06 MED ORDER — SODIUM CHLORIDE 0.9 % IV SOLN
510.0000 mg | Freq: Once | INTRAVENOUS | Status: AC
  Administered 2015-04-06: 510 mg via INTRAVENOUS
  Filled 2015-04-06: qty 17

## 2015-04-06 MED ORDER — SODIUM CHLORIDE 0.9 % IV SOLN: Freq: Once | INTRAVENOUS | Status: DC

## 2015-04-06 NOTE — Patient Instructions (Signed)

## 2015-04-06 NOTE — Progress Notes (Signed)
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   #2 She had iron infusions in past for a low Ferritin, last Feraheme infusion was 12/03/2013.  CURRENT THERAPY: B12 injections every month and iron infuion prn  INTERVAL HISTORY:  Kayla Mayo 59 y.o. female returns for followup visit today. She was last seen by me 6 month ago.  She is doing well overall. She complains intermittent dizziness and lightheadedness in the past few months, she takes Milnor with some improvement. She has been fatigue, stable, functions well at home. She otherwise denies any significant pain, dyspnea, GI or other symptoms. She has been coming here for B12 and injection monthly.  MEDICAL HISTORY: Past Medical History  Diagnosis Date  . Anxiety   . Diabetes mellitus   . GERD (gastroesophageal reflux disease)   . Hyperlipidemia   . Hypertension   . Anal fissure   . Arthritis   . Chronic headaches   . Colon polyps   . Depression   . Gallstones   . IBS (irritable bowel syndrome)   . Obesity   . Status post cardiac catheterization   . Anemia 03/17/2011  . Osteopenia 07/2011    t score -1.5 FRAX 2.1%/0%  . Vertigo     Occurs 6 times a month  . Bursitis of hip   . Esophageal spasm   . Neuropathy ([place])   . Breast cancer (HCC)     ALLERGIES:  is allergic to chicken allergy; morphine and related; codeine; corn-containing products; other; peanut-containing drug products; sesame oil; simvastatin; and wheat.  MEDICATIONS:  Current Outpatient Prescriptions  Medication Sig Dispense Refill  . ACCU-CHEK FASTCLIX LANCETS MISC Use daily as directed for Dx: 250.00    . ALPRAZolam (XANAX) 0.5 MG tablet Take 1 tablet  by mouth 2 (two) times daily as needed.  2  . amitriptyline (ELAVIL) 10 MG tablet Take 50 mg by mouth at bedtime.     Marland Kitchen aspirin-acetaminophen-caffeine (EXCEDRIN MIGRAINE) 250-250-65 MG per tablet Take 1 tablet by mouth every 6 (six) hours as needed.    . clobetasol ointment (TEMOVATE) 0.05 % 2 (two) times daily. On scalp bid  2  . cyanocobalamin (,VITAMIN B-12,) 1000 MCG/ML injection Inject 1,000 mcg into the muscle every 30 (thirty) days.      . cyclobenzaprine (FLEXERIL) 10 MG tablet Take 10 mg by mouth 3 (three) times daily as needed.    . enalapril (VASOTEC) 20 MG tablet Take 1 tablet (20 mg total) by mouth daily. 90 tablet 3  . fluticasone (FLONASE) 50 MCG/ACT nasal spray Place 2 sprays into both nostrils 2 (two) times daily.    . furosemide (LASIX) 20 MG tablet Take 1 tablet (20 mg total) by mouth 2 (two) times daily. 180 tablet 3  . glucose blood test strip Use as directed    . hydrocortisone (PROCTOSOL HC) 2.5 % rectal cream Place 1 application rectally as needed. 30 g 0  . levothyroxine (SYNTHROID, LEVOTHROID) 25 MCG tablet Take 25 mcg by mouth.    . Linaclotide (LINZESS) 290 MCG CAPS capsule Take 1 capsule by mouth daily.    . Liraglutide (VICTOZA [place]) Inject 1.2 mg into the skin daily.     Marland Kitchen lovastatin (MEVACOR) 40 MG tablet Take  40 mg by mouth.    . meclizine (ANTIVERT) 25 MG tablet Take 25 mg by mouth 3 (three) times daily as needed.      . meloxicam (MOBIC) 15 MG tablet Take 1 tablet by mouth daily.  1  . metFORMIN (GLUCOPHAGE) 500 MG tablet Take 500 mg by mouth 2 (two) times daily with a meal.     . pantoprazole (PROTONIX) 20 MG tablet Take 20 mg by mouth daily.    . polyethylene glycol powder (GLYCOLAX/MIRALAX) powder Take 17 g by mouth 2 (two) times daily. 255 g 0  . rosuvastatin (CRESTOR) 10 MG tablet Take 5 mg by mouth daily.     . traMADol (ULTRAM) 50 MG tablet Take 50 mg by mouth 3 (three) times daily.    . traZODone (DESYREL) 100 MG tablet 1& 1/2 TABLETS BY MOUTH AT BEDTIME  135 tablet 1  . verapamil (CALAN) 80 MG tablet Take 1 tablet (80 mg total) by mouth daily. 90 tablet 3  . EPIPEN 2-PAK 0.3 MG/0.3ML DEVI as needed.     No current facility-administered medications for this visit.   Facility-Administered Medications Ordered in Other Visits  Medication Dose Route Frequency Provider Last Rate Last Dose  . cyanocobalamin ((VITAMIN B-12)) injection 1,000 mcg  1,000 mcg Intramuscular Q28 days Consuela Mimes, MD        SURGICAL HISTORY:  Past Surgical History  Procedure Laterality Date  . Appendectomy    . Cholecystectomy    . Gastric bypass    . Lower back surgery    . Left shouler surgery    . Tubal ligation    . Bilareral knee scopes    . Carpal tunnel release      REVIEW OF SYSTEMS:  Pertinent items are noted in HPI.   PHYSICAL EXAMINATION: BP 145/90 mmHg  Pulse 101  Temp(Src) 99 F (37.2 C) (Oral)  Resp 19  Ht 5\' 6"  (1.676 m)  Wt 230 lb (104.327 kg)  BMI 37.14 kg/m2  SpO2 100%  LMP 05/02/2006  General appearance: alert, cooperative and appears stated age Resp: clear to auscultation bilaterally and normal percussion bilaterally Back: symmetric, no curvature. ROM normal. No CVA tenderness. Cardio: regular rate and rhythm, S1, S2 normal, no murmur, click, rub or gallop GI: soft, non-tender; bowel sounds normal; no masses,  no organomegaly Extremities: extremities normal, atraumatic, no cyanosis or edema Neurologic: Alert and oriented X 3, normal strength and tone. Normal symmetric reflexes. Normal coordination and gait  ECOG PERFORMANCE STATUS: 1 - Symptomatic but completely ambulatory  Blood pressure 145/90, pulse 101, temperature 99 F (37.2 C), temperature source Oral, resp. rate 19, height 5\' 6"  (1.676 m), weight 230 lb (104.327 kg), last menstrual period 05/02/2006, SpO2 100 %.  LABORATORY DATA: CBC Latest Ref Rng 03/30/2015 09/30/2014 06/09/2014  WBC 3.9 - 10.3 10e3/uL 4.2 5.6 6.2  Hemoglobin 11.6 - 15.9 g/dL 11.1(L) 11.8 11.8   Hematocrit 34.8 - 46.6 % 34.4(L) 36.7 36.6  Platelets 145 - 400 10e3/uL 222 247 220    CMP Latest Ref Rng 03/30/2015 09/30/2014 06/09/2014  Glucose 70 - 140 mg/dl 200(H) 251(H) 104  BUN 7.0 - 26.0 mg/dL 13.3 11.2 8.6  Creatinine 0.6 - 1.1 mg/dL 0.9 0.9 0.9  Sodium 136 - 145 mEq/L 139 140 141  Potassium 3.5 - 5.1 mEq/L 3.9 4.7 3.9  Chloride 96 - 112 mEq/L - - -  CO2 22 - 29 mEq/L 26 26 26   Calcium 8.4 - 10.4 mg/dL 9.0 9.0 8.9  Total Protein  6.4 - 8.3 g/dL 6.7 6.6 6.6  Total Bilirubin 0.20 - 1.20 mg/dL 0.45 0.52 0.33  Alkaline Phos 40 - 150 U/L 87 93 86  AST 5 - 34 U/L 19 18 19   ALT 0 - 55 U/L 13 17 14    Results for LARAYA, ROBERTS (MRN [id number]) as of 04/06/2015 08:01  Ref. Range 09/30/2014 10:26 03/30/2015 13:04  Iron Latest Ref Range: 41-142 ug/dL 90 75  UIBC Latest Ref Range: 120-384 ug/dL 170 174  TIBC Latest Ref Range: 236-444 ug/dL 260 249  %SAT Latest Ref Range: 21-57 % 35 30  Ferritin Latest Ref Range: 9-269 ng/ml 119 87    RADIOGRAPHIC STUDIES:  Mammogram 01/25/12 Negative for malignancy BIRADS category 1.  ASSESSMENT:    59 year old female s/p gastric bypass surgery  1. Anemia secondary to B12 deficiency and iron deficiency, secondary to gastric bypass surgery -Continue monthly B12 injection, her last B12 level was normal at 632 on 03/17/2014. -Her ferritin level was 87 last week, giving her slightly worse anemia, and dizziness, I recommend IV Feraheme today and next week. -Her hemoglobin slightly worse lately 11.1 last week.  No need for blood transfusion. -We'll continue to monitor her CBC and iron studies every 3 months  2. Cancer screening -Her last mammogram was in October 2013. I encouraged her to have screening mammogram once a year. I ordered one for her today, she would like to have it after the holiday.   3. Dizziness -I checked her orthostatic blood pressure, BP dropped from 150/91 at sitting to 119/92 on standing, and heart rate increased from 86-102.  She is orthostatic. -I encouraged her to call her primary care physician Dr. Glennon Mac to see if she needs to adjust her blood pressure medication. I'll also call him a note to Dr. Glennon Mac [facility] see if her dizziness improved after IV Feraheme.  3. She will continue follow-up with her primary care physician for other medical problems.  Plan -IV Feraheme 510 mg infusion today and next week -Lab every 3 months, I'll see her back in 6 months, IV Feraheme if ferritin less than 100 -Follow up with Dr. Glennon Mac for her orthostatic hypotension  Truitt Merle  04/06/2015, 2:14 PM

## 2015-04-13 ENCOUNTER — Telehealth: Payer: Self-pay | Admitting: Hematology

## 2015-04-13 ENCOUNTER — Telehealth: Payer: Self-pay | Admitting: *Deleted

## 2015-04-13 NOTE — Telephone Encounter (Signed)
per pof to sch pt appt-gave pt copy of sch-sent MW enail to sch pt trmt-will call pt w/fera appt after reply

## 2015-04-13 NOTE — Telephone Encounter (Signed)
Per staff message and POF I have scheduled appts. Advised scheduler of appts. JMW  

## 2015-04-22 ENCOUNTER — Ambulatory Visit (HOSPITAL_BASED_OUTPATIENT_CLINIC_OR_DEPARTMENT_OTHER): Payer: Medicare HMO

## 2015-04-22 VITALS — BP 112/72 | HR 81 | Temp 98.5°F | Resp 16

## 2015-04-22 DIAGNOSIS — Z9884 Bariatric surgery status: Secondary | ICD-10-CM

## 2015-04-22 DIAGNOSIS — D518 Other vitamin B12 deficiency anemias: Secondary | ICD-10-CM

## 2015-04-22 DIAGNOSIS — D509 Iron deficiency anemia, unspecified: Secondary | ICD-10-CM | POA: Diagnosis not present

## 2015-04-22 DIAGNOSIS — D508 Other iron deficiency anemias: Secondary | ICD-10-CM

## 2015-04-22 MED ORDER — SODIUM CHLORIDE 0.9 % IV SOLN
Freq: Once | INTRAVENOUS | Status: AC
  Administered 2015-04-22: 10:00:00 via INTRAVENOUS

## 2015-04-22 MED ORDER — FERUMOXYTOL INJECTION 510 MG/17 ML
510.0000 mg | Freq: Once | INTRAVENOUS | Status: AC
  Administered 2015-04-22: 510 mg via INTRAVENOUS
  Filled 2015-04-22: qty 17

## 2015-04-22 NOTE — Patient Instructions (Signed)

## 2015-04-28 ENCOUNTER — Ambulatory Visit (HOSPITAL_BASED_OUTPATIENT_CLINIC_OR_DEPARTMENT_OTHER): Payer: Medicare HMO

## 2015-04-28 VITALS — BP 123/77 | HR 90 | Temp 99.9°F

## 2015-04-28 DIAGNOSIS — D510 Vitamin B12 deficiency anemia due to intrinsic factor deficiency: Secondary | ICD-10-CM

## 2015-04-28 DIAGNOSIS — D519 Vitamin B12 deficiency anemia, unspecified: Secondary | ICD-10-CM

## 2015-04-28 DIAGNOSIS — D509 Iron deficiency anemia, unspecified: Secondary | ICD-10-CM

## 2015-04-28 MED ORDER — CYANOCOBALAMIN 1000 MCG/ML IJ SOLN
1000.0000 ug | Freq: Once | INTRAMUSCULAR | Status: AC
  Administered 2015-04-28: 1000 ug via INTRAMUSCULAR

## 2015-04-29 ENCOUNTER — Ambulatory Visit: Payer: Self-pay

## 2015-05-25 ENCOUNTER — Ambulatory Visit (HOSPITAL_BASED_OUTPATIENT_CLINIC_OR_DEPARTMENT_OTHER): Payer: BLUE CROSS/BLUE SHIELD

## 2015-05-25 VITALS — BP 106/68 | HR 85 | Temp 98.6°F

## 2015-05-25 DIAGNOSIS — D500 Iron deficiency anemia secondary to blood loss (chronic): Secondary | ICD-10-CM

## 2015-05-25 DIAGNOSIS — D519 Vitamin B12 deficiency anemia, unspecified: Secondary | ICD-10-CM | POA: Diagnosis not present

## 2015-05-25 DIAGNOSIS — D510 Vitamin B12 deficiency anemia due to intrinsic factor deficiency: Secondary | ICD-10-CM

## 2015-05-25 MED ORDER — CYANOCOBALAMIN 1000 MCG/ML IJ SOLN
1000.0000 ug | Freq: Once | INTRAMUSCULAR | Status: AC
  Administered 2015-05-25: 1000 ug via INTRAMUSCULAR

## 2015-06-22 ENCOUNTER — Ambulatory Visit (HOSPITAL_BASED_OUTPATIENT_CLINIC_OR_DEPARTMENT_OTHER): Payer: BLUE CROSS/BLUE SHIELD

## 2015-06-22 VITALS — BP 131/83 | HR 90 | Temp 98.9°F

## 2015-06-22 DIAGNOSIS — D518 Other vitamin B12 deficiency anemias: Secondary | ICD-10-CM

## 2015-06-22 DIAGNOSIS — D510 Vitamin B12 deficiency anemia due to intrinsic factor deficiency: Secondary | ICD-10-CM

## 2015-06-22 MED ORDER — CYANOCOBALAMIN 1000 MCG/ML IJ SOLN
1000.0000 ug | Freq: Once | INTRAMUSCULAR | Status: AC
  Administered 2015-06-22: 1000 ug via INTRAMUSCULAR

## 2015-07-20 ENCOUNTER — Other Ambulatory Visit (HOSPITAL_BASED_OUTPATIENT_CLINIC_OR_DEPARTMENT_OTHER): Payer: Medicare HMO

## 2015-07-20 ENCOUNTER — Other Ambulatory Visit: Payer: Self-pay | Admitting: Hematology

## 2015-07-20 ENCOUNTER — Ambulatory Visit (HOSPITAL_BASED_OUTPATIENT_CLINIC_OR_DEPARTMENT_OTHER): Payer: BLUE CROSS/BLUE SHIELD

## 2015-07-20 VITALS — BP 122/78 | HR 79 | Temp 99.3°F

## 2015-07-20 DIAGNOSIS — D509 Iron deficiency anemia, unspecified: Secondary | ICD-10-CM

## 2015-07-20 DIAGNOSIS — D510 Vitamin B12 deficiency anemia due to intrinsic factor deficiency: Secondary | ICD-10-CM

## 2015-07-20 DIAGNOSIS — D531 Other megaloblastic anemias, not elsewhere classified: Secondary | ICD-10-CM

## 2015-07-20 DIAGNOSIS — D6489 Other specified anemias: Secondary | ICD-10-CM

## 2015-07-20 LAB — CBC & DIFF AND RETIC
BASO%: 0.5 % (ref 0.0–2.0)
Basophils Absolute: 0 10*3/uL (ref 0.0–0.1)
EOS%: 1.8 % (ref 0.0–7.0)
Eosinophils Absolute: 0.1 10*3/uL (ref 0.0–0.5)
HEMATOCRIT: 35.9 % (ref 34.8–46.6)
HGB: 11.7 g/dL (ref 11.6–15.9)
Immature Retic Fract: 2.8 % (ref 1.60–10.00)
LYMPH%: 33.9 % (ref 14.0–49.7)
MCH: 30.9 pg (ref 25.1–34.0)
MCHC: 32.6 g/dL (ref 31.5–36.0)
MCV: 94.7 fL (ref 79.5–101.0)
MONO#: 0.3 10*3/uL (ref 0.1–0.9)
MONO%: 5.7 % (ref 0.0–14.0)
NEUT%: 58.1 % (ref 38.4–76.8)
NEUTROS ABS: 2.6 10*3/uL (ref 1.5–6.5)
Platelets: 239 10*3/uL (ref 145–400)
RBC: 3.79 10*6/uL (ref 3.70–5.45)
RDW: 13.4 % (ref 11.2–14.5)
RETIC %: 1.69 % (ref 0.70–2.10)
Retic Ct Abs: 64.05 10*3/uL (ref 33.70–90.70)
WBC: 4.4 10*3/uL (ref 3.9–10.3)
lymph#: 1.5 10*3/uL (ref 0.9–3.3)

## 2015-07-20 LAB — COMPREHENSIVE METABOLIC PANEL
ALK PHOS: 95 U/L (ref 40–150)
ALT: 19 U/L (ref 0–55)
AST: 19 U/L (ref 5–34)
Albumin: 3.9 g/dL (ref 3.5–5.0)
Anion Gap: 9 mEq/L (ref 3–11)
BILIRUBIN TOTAL: 0.52 mg/dL (ref 0.20–1.20)
BUN: 8.3 mg/dL (ref 7.0–26.0)
CALCIUM: 9.2 mg/dL (ref 8.4–10.4)
CO2: 29 mEq/L (ref 22–29)
Chloride: 103 mEq/L (ref 98–109)
Creatinine: 0.9 mg/dL (ref 0.6–1.1)
EGFR: 87 mL/min/{1.73_m2} — ABNORMAL LOW (ref >90)
GLUCOSE: 158 mg/dL — AB (ref 70–140)
Potassium: 3.8 mEq/L (ref 3.5–5.1)
SODIUM: 141 meq/L (ref 136–145)
Total Protein: 7 g/dL (ref 6.4–8.3)

## 2015-07-20 LAB — IRON AND TIBC
%SAT: 37 % (ref 21–57)
Iron: 83 ug/dL (ref 41–142)
TIBC: 226 ug/dL — ABNORMAL LOW (ref 236–444)
UIBC: 144 ug/dL (ref 120–384)

## 2015-07-20 LAB — FERRITIN: FERRITIN: 319 ng/mL — AB (ref 9–269)

## 2015-07-20 MED ORDER — CYANOCOBALAMIN 1000 MCG/ML IJ SOLN
1000.0000 ug | Freq: Once | INTRAMUSCULAR | Status: AC
  Administered 2015-07-20: 1000 ug via INTRAMUSCULAR

## 2015-07-21 LAB — VITAMIN B12: VITAMIN B 12: 874 pg/mL (ref 211–946)

## 2015-08-17 ENCOUNTER — Ambulatory Visit (HOSPITAL_BASED_OUTPATIENT_CLINIC_OR_DEPARTMENT_OTHER): Payer: BLUE CROSS/BLUE SHIELD

## 2015-08-17 VITALS — BP 122/82 | HR 92 | Temp 99.0°F | Resp 22

## 2015-08-17 DIAGNOSIS — D510 Vitamin B12 deficiency anemia due to intrinsic factor deficiency: Secondary | ICD-10-CM

## 2015-08-17 MED ORDER — CYANOCOBALAMIN 1000 MCG/ML IJ SOLN
1000.0000 ug | Freq: Once | INTRAMUSCULAR | Status: AC
  Administered 2015-08-17: 1000 ug via INTRAMUSCULAR

## 2015-08-17 NOTE — Patient Instructions (Signed)

## 2015-09-14 ENCOUNTER — Ambulatory Visit (HOSPITAL_BASED_OUTPATIENT_CLINIC_OR_DEPARTMENT_OTHER): Payer: BLUE CROSS/BLUE SHIELD

## 2015-09-14 VITALS — BP 135/74 | HR 98 | Temp 99.0°F

## 2015-09-14 DIAGNOSIS — D510 Vitamin B12 deficiency anemia due to intrinsic factor deficiency: Secondary | ICD-10-CM | POA: Diagnosis not present

## 2015-09-14 DIAGNOSIS — D518 Other vitamin B12 deficiency anemias: Secondary | ICD-10-CM

## 2015-09-14 MED ORDER — CYANOCOBALAMIN 1000 MCG/ML IJ SOLN
1000.0000 ug | Freq: Once | INTRAMUSCULAR | Status: AC
  Administered 2015-09-14: 1000 ug via INTRAMUSCULAR

## 2015-09-16 ENCOUNTER — Encounter: Payer: Medicare HMO | Admitting: Gynecology

## 2015-10-12 ENCOUNTER — Encounter: Payer: Self-pay | Admitting: Hematology

## 2015-10-12 ENCOUNTER — Other Ambulatory Visit (HOSPITAL_BASED_OUTPATIENT_CLINIC_OR_DEPARTMENT_OTHER): Payer: BLUE CROSS/BLUE SHIELD

## 2015-10-12 ENCOUNTER — Ambulatory Visit: Payer: Self-pay

## 2015-10-12 ENCOUNTER — Ambulatory Visit (HOSPITAL_BASED_OUTPATIENT_CLINIC_OR_DEPARTMENT_OTHER): Payer: BLUE CROSS/BLUE SHIELD

## 2015-10-12 ENCOUNTER — Ambulatory Visit (HOSPITAL_BASED_OUTPATIENT_CLINIC_OR_DEPARTMENT_OTHER): Payer: BLUE CROSS/BLUE SHIELD | Admitting: Hematology

## 2015-10-12 ENCOUNTER — Telehealth: Payer: Self-pay | Admitting: Hematology

## 2015-10-12 VITALS — BP 140/82 | HR 92 | Temp 98.7°F | Resp 18 | Ht 66.0 in | Wt 224.6 lb

## 2015-10-12 DIAGNOSIS — G4700 Insomnia, unspecified: Secondary | ICD-10-CM | POA: Diagnosis not present

## 2015-10-12 DIAGNOSIS — R5383 Other fatigue: Secondary | ICD-10-CM | POA: Diagnosis not present

## 2015-10-12 DIAGNOSIS — D510 Vitamin B12 deficiency anemia due to intrinsic factor deficiency: Secondary | ICD-10-CM

## 2015-10-12 DIAGNOSIS — D518 Other vitamin B12 deficiency anemias: Secondary | ICD-10-CM | POA: Insufficient documentation

## 2015-10-12 DIAGNOSIS — D508 Other iron deficiency anemias: Secondary | ICD-10-CM

## 2015-10-12 DIAGNOSIS — D6489 Other specified anemias: Secondary | ICD-10-CM

## 2015-10-12 DIAGNOSIS — D509 Iron deficiency anemia, unspecified: Secondary | ICD-10-CM

## 2015-10-12 DIAGNOSIS — M1990 Unspecified osteoarthritis, unspecified site: Secondary | ICD-10-CM

## 2015-10-12 DIAGNOSIS — Z9884 Bariatric surgery status: Secondary | ICD-10-CM

## 2015-10-12 LAB — COMPREHENSIVE METABOLIC PANEL
ALBUMIN: 3.8 g/dL (ref 3.5–5.0)
ALK PHOS: 86 U/L (ref 40–150)
ALT: 19 U/L (ref 0–55)
AST: 19 U/L (ref 5–34)
Anion Gap: 10 mEq/L (ref 3–11)
BUN: 8.6 mg/dL (ref 7.0–26.0)
CHLORIDE: 106 meq/L (ref 98–109)
CO2: 24 meq/L (ref 22–29)
Calcium: 9.3 mg/dL (ref 8.4–10.4)
Creatinine: 0.8 mg/dL (ref 0.6–1.1)
GLUCOSE: 112 mg/dL (ref 70–140)
POTASSIUM: 4 meq/L (ref 3.5–5.1)
SODIUM: 140 meq/L (ref 136–145)
Total Bilirubin: 0.38 mg/dL (ref 0.20–1.20)
Total Protein: 7.2 g/dL (ref 6.4–8.3)

## 2015-10-12 LAB — CBC & DIFF AND RETIC
BASO%: 0.6 % (ref 0.0–2.0)
Basophils Absolute: 0 10*3/uL (ref 0.0–0.1)
EOS%: 4 % (ref 0.0–7.0)
Eosinophils Absolute: 0.2 10*3/uL (ref 0.0–0.5)
HCT: 37.4 % (ref 34.8–46.6)
HGB: 12.3 g/dL (ref 11.6–15.9)
Immature Retic Fract: 3.5 % (ref 1.60–10.00)
LYMPH%: 31.3 % (ref 14.0–49.7)
MCH: 31.1 pg (ref 25.1–34.0)
MCHC: 32.9 g/dL (ref 31.5–36.0)
MCV: 94.4 fL (ref 79.5–101.0)
MONO#: 0.3 10*3/uL (ref 0.1–0.9)
MONO%: 5.9 % (ref 0.0–14.0)
NEUT%: 58.2 % (ref 38.4–76.8)
NEUTROS ABS: 2.9 10*3/uL (ref 1.5–6.5)
Platelets: 194 10*3/uL (ref 145–400)
RBC: 3.96 10*6/uL (ref 3.70–5.45)
RDW: 13.5 % (ref 11.2–14.5)
Retic %: 1.79 % (ref 0.70–2.10)
Retic Ct Abs: 70.88 10*3/uL (ref 33.70–90.70)
WBC: 5.1 10*3/uL (ref 3.9–10.3)
lymph#: 1.6 10*3/uL (ref 0.9–3.3)

## 2015-10-12 LAB — FERRITIN: Ferritin: 271 ng/ml — ABNORMAL HIGH (ref 9–269)

## 2015-10-12 LAB — IRON AND TIBC
%SAT: 40 % (ref 21–57)
Iron: 101 ug/dL (ref 41–142)
TIBC: 254 ug/dL (ref 236–444)
UIBC: 153 ug/dL (ref 120–384)

## 2015-10-12 MED ORDER — CYANOCOBALAMIN 1000 MCG/ML IJ SOLN
1000.0000 ug | Freq: Once | INTRAMUSCULAR | Status: AC
  Administered 2015-10-12: 1000 ug via INTRAMUSCULAR

## 2015-10-12 NOTE — Progress Notes (Signed)
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   #2 She had iron infusions in past for a low Ferritin, last Feraheme infusion was 12/03/2013.  CURRENT THERAPY: B12 injections every month and iron infuion prn  INTERVAL HISTORY:  Kayla Mayo 59 y.o. female returns for followup visit today. She is doing well overall. She still has chronic back and shoulder pain, which is stable. She has moderate fatigue, able tolerate routine activities. She takes trazodone for sleep and is in excess needed for anxiety. She tolerates B12 injection very well, and is very compliant. She denies any hematochezia, melena or other signs of bleeding.   MEDICAL HISTORY: Past Medical History  Diagnosis Date  . Anxiety   . Diabetes mellitus   . GERD (gastroesophageal reflux disease)   . Hyperlipidemia   . Hypertension   . Anal fissure   . Arthritis   . Chronic headaches   . Colon polyps   . Depression   . Gallstones   . IBS (irritable bowel syndrome)   . Obesity   . Status post cardiac catheterization   . Anemia 03/17/2011  . Osteopenia 07/2011    t score -1.5 FRAX 2.1%/0%  . Vertigo     Occurs 6 times a month  . Bursitis of hip   . Esophageal spasm   . Neuropathy ([place])   . Breast cancer (HCC)     ALLERGIES:  is allergic to chicken allergy; morphine and related; codeine; corn-containing products; other; peanut-containing drug products; sesame oil; simvastatin; and wheat.  MEDICATIONS:  Current Outpatient Prescriptions  Medication Sig Dispense Refill  . ACCU-CHEK FASTCLIX LANCETS MISC Use daily as directed for Dx: 250.00    . ALPRAZolam (XANAX) 0.5 MG tablet Take 1 tablet by mouth 2 (two) times daily  as needed.  2  . amitriptyline (ELAVIL) 10 MG tablet Take 50 mg by mouth at bedtime.     Marland Kitchen aspirin-acetaminophen-caffeine (EXCEDRIN MIGRAINE) 250-250-65 MG per tablet Take 1 tablet by mouth every 6 (six) hours as needed.    . clobetasol ointment (TEMOVATE) 0.05 % 2 (two) times daily. On scalp bid  2  . cyanocobalamin (,VITAMIN B-12,) 1000 MCG/ML injection Inject 1,000 mcg into the muscle every 30 (thirty) days.      . cyclobenzaprine (FLEXERIL) 10 MG tablet Take 10 mg by mouth 3 (three) times daily as needed.    . enalapril (VASOTEC) 20 MG tablet Take 1 tablet (20 mg total) by mouth daily. 90 tablet 3  . EPIPEN 2-PAK 0.3 MG/0.3ML DEVI as needed.    . fluticasone (FLONASE) 50 MCG/ACT nasal spray Place 2 sprays into both nostrils 2 (two) times daily.    . furosemide (LASIX) 20 MG tablet Take 1 tablet (20 mg total) by mouth 2 (two) times daily. 180 tablet 3  . glucose blood test strip Use as directed    . hydrocortisone (PROCTOSOL HC) 2.5 % rectal cream Place 1 application rectally as needed. 30 g 0  . levothyroxine (SYNTHROID, LEVOTHROID) 25 MCG tablet Take 25 mcg by mouth.    . Linaclotide (LINZESS) 290 MCG CAPS capsule Take 1 capsule by mouth daily.    . Liraglutide (VICTOZA [place]) Inject 1.2 mg into the skin daily.     Marland Kitchen  lovastatin (MEVACOR) 40 MG tablet Take 40 mg by mouth.    . meclizine (ANTIVERT) 25 MG tablet Take 25 mg by mouth 3 (three) times daily as needed.      . meloxicam (MOBIC) 15 MG tablet Take 1 tablet by mouth daily.  1  . metFORMIN (GLUCOPHAGE) 500 MG tablet Take 500 mg by mouth 2 (two) times daily with a meal.     . pantoprazole (PROTONIX) 20 MG tablet Take 20 mg by mouth daily.    . polyethylene glycol powder (GLYCOLAX/MIRALAX) powder Take 17 g by mouth 2 (two) times daily. 255 g 0  . rosuvastatin (CRESTOR) 10 MG tablet Take 5 mg by mouth daily.     . traMADol (ULTRAM) 50 MG tablet Take 50 mg by mouth 3 (three) times daily.    . traZODone (DESYREL) 100 MG tablet 1& 1/2 TABLETS  BY MOUTH AT BEDTIME 135 tablet 1  . verapamil (CALAN) 80 MG tablet Take 1 tablet (80 mg total) by mouth daily. 90 tablet 3   No current facility-administered medications for this visit.   Facility-Administered Medications Ordered in Other Visits  Medication Dose Route Frequency Provider Last Rate Last Dose  . cyanocobalamin ((VITAMIN B-12)) injection 1,000 mcg  1,000 mcg Intramuscular Q28 days Consuela Mimes, MD        SURGICAL HISTORY:  Past Surgical History  Procedure Laterality Date  . Appendectomy    . Cholecystectomy    . Gastric bypass    . Lower back surgery    . Left shouler surgery    . Tubal ligation    . Bilareral knee scopes    . Carpal tunnel release      REVIEW OF SYSTEMS:  Pertinent items are noted in HPI.   PHYSICAL EXAMINATION: LMP 05/02/2006  General appearance: alert, cooperative and appears stated age Resp: clear to auscultation bilaterally and normal percussion bilaterally Back: symmetric, no curvature. ROM normal. No CVA tenderness. Cardio: regular rate and rhythm, S1, S2 normal, no murmur, click, rub or gallop GI: soft, non-tender; bowel sounds normal; no masses,  no organomegaly Extremities: extremities normal, atraumatic, no cyanosis or edema Neurologic: Alert and oriented X 3, normal strength and tone. Normal symmetric reflexes. Normal coordination and gait  ECOG PERFORMANCE STATUS: 1 - Symptomatic but completely ambulatory  Last menstrual period 05/02/2006.  LABORATORY DATA: CBC Latest Ref Rng 10/12/2015 07/20/2015 03/30/2015  WBC 3.9 - 10.3 10e3/uL 5.1 4.4 4.2  Hemoglobin 11.6 - 15.9 g/dL 12.3 11.7 11.1(L)  Hematocrit 34.8 - 46.6 % 37.4 35.9 34.4(L)  Platelets 145 - 400 10e3/uL 194 239 222    CMP Latest Ref Rng 10/12/2015 07/20/2015 03/30/2015  Glucose 70 - 140 mg/dl 112 158(H) 200(H)  BUN 7.0 - 26.0 mg/dL 8.6 8.3 13.3  Creatinine 0.6 - 1.1 mg/dL 0.8 0.9 0.9  Sodium 136 - 145 mEq/L 140 141 139  Potassium 3.5 - 5.1 mEq/L 4.0 3.8 3.9  CO2 22 -  29 mEq/L 24 29 26   Calcium 8.4 - 10.4 mg/dL 9.3 9.2 9.0  Total Protein 6.4 - 8.3 g/dL 7.2 7.0 6.7  Total Bilirubin 0.20 - 1.20 mg/dL 0.38 0.52 0.45  Alkaline Phos 40 - 150 U/L 86 95 87  AST 5 - 34 U/L 19 19 19   ALT 0 - 55 U/L 19 19 13    Results for Kayla Mayo, Kayla Mayo (MRN [id number]) as of 10/13/2015 08:10  Ref. Range 03/30/2015 13:04 07/20/2015 10:48 10/12/2015 12:15  Iron Latest Ref Range: 41-142 ug/dL 75 83 101  UIBC  Latest Ref Range: 120-384 ug/dL 174 144 153  TIBC Latest Ref Range: 236-444 ug/dL 249 226 (L) 254  %SAT Latest Ref Range: 21-57 % 30 37 40  Ferritin Latest Ref Range: 9-269 ng/ml 87 319 (H) 271 (H)   Vitamin B12  Status: Finalresult Visible to patient:  MyChart Nextappt: 11/04/2015 at 11:00 AM in Gynecology Anastasio Auerbach, MD) Dx:  Vitamin B12 deficient megaloblastic a...           Ref Range 55mo ago (07/20/15) 66yr ago (03/17/14) 84yr ago (11/28/13) 61yr ago (06/29/12)    Vitamin B12 211 - 946 pg/mL 874 [id number] 1541 (H)R 517R          RADIOGRAPHIC STUDIES:  Mammogram 01/25/12 Negative for malignancy BIRADS category 1.  ASSESSMENT:    60 year old female s/p gastric bypass surgery  1. Anemia secondary to B12 deficiency and iron deficiency, secondary to gastric bypass surgery -Continue monthly B12 injection, her last B12 level has been normal  -Her ferritin level was 271 today, with normal serum iron and transferrin saturation, no need IV Feraheme.  -She is not on oral iron supplement -Her hemoglobin is normal at 12.3 today -We'll continue to monitor her CBC and iron studies every 3 months  2. Arthritis, fatigue, insomnia -She'll continue follow-up with her primary care physician  3. She will continue follow-up with her primary care physician for other medical problems.  Plan -Lab every 3 months, I'll see her back in 6 months, IV Feraheme if ferritin less than 100   Truitt Merle  10/12/2015,

## 2015-10-12 NOTE — Patient Instructions (Signed)

## 2015-10-12 NOTE — Telephone Encounter (Signed)
Gave pt apt & avs °

## 2015-11-04 ENCOUNTER — Ambulatory Visit (INDEPENDENT_AMBULATORY_CARE_PROVIDER_SITE_OTHER): Payer: Medicare HMO | Admitting: Gynecology

## 2015-11-04 ENCOUNTER — Encounter: Payer: Self-pay | Admitting: Gynecology

## 2015-11-04 VITALS — BP 130/80 | Ht 66.0 in | Wt 221.0 lb

## 2015-11-04 DIAGNOSIS — R21 Rash and other nonspecific skin eruption: Secondary | ICD-10-CM

## 2015-11-04 DIAGNOSIS — Z01419 Encounter for gynecological examination (general) (routine) without abnormal findings: Secondary | ICD-10-CM

## 2015-11-04 DIAGNOSIS — B373 Candidiasis of vulva and vagina: Secondary | ICD-10-CM | POA: Diagnosis not present

## 2015-11-04 DIAGNOSIS — N952 Postmenopausal atrophic vaginitis: Secondary | ICD-10-CM | POA: Diagnosis not present

## 2015-11-04 DIAGNOSIS — M8580 Other specified disorders of bone density and structure, unspecified site: Secondary | ICD-10-CM | POA: Diagnosis not present

## 2015-11-04 DIAGNOSIS — B3731 Acute candidiasis of vulva and vagina: Secondary | ICD-10-CM

## 2015-11-04 MED ORDER — FLUCONAZOLE 150 MG PO TABS: 150.0000 mg | ORAL_TABLET | Freq: Once | ORAL | Status: DC

## 2015-11-04 MED ORDER — NYSTATIN 100000 UNIT/GM EX CREA: 1.0000 "application " | TOPICAL_CREAM | Freq: Two times a day (BID) | CUTANEOUS | Status: DC

## 2015-11-04 NOTE — Patient Instructions (Signed)
Take the one Diflucan pill to help with the vaginal irritation.  Apply the nystatin skin cream to irritated areas under the breast and in the groin as needed.  Schedule your mammogram.  Follow up in one year for annual exam, sooner if any issues.

## 2015-11-04 NOTE — Progress Notes (Signed)
    Rikiyah Quackenbush [facility] 04-Jul-1955 [id number]        60 y.o.  G2P2  for breast and pelvic exam. Several issues noted below.  Past medical history,surgical history, problem list, medications, allergies, family history and social history were all reviewed and documented as reviewed in the EPIC chart.  ROS:  Performed with pertinent positives and negatives included in the history, assessment and plan.   Additional significant findings :   vaginitis and skin rash as discussed below   ExamKim Alexis assistant Filed Vitals:   11/04/15 1053  BP: 130/80  Height: 5\' 6"  (1.676 m)  Weight: 221 lb (100.245 kg)   General appearance:  Normal affect, orientation and appearance. Skin: Grossly normal HEENT: Without gross lesions.  No cervical or supraclavicular adenopathy. Thyroid normal.  Lungs:  Clear without wheezing, rales or rhonchi Cardiac: RR, without RMG Abdominal:  Soft, nontender, without masses, guarding, rebound, organomegaly or hernia Breasts:  Examined lying and sitting without masses, retractions, discharge or axillary adenopathy. Pelvic:  Ext/BUS/vagi with atrophic changes. Slight white discharge noted.  Cerv with atrophic changes. Pap smear done  Uterus anteverted, normal size, shape and contour, midline and mobile nontender   Adnexa without masses or tenderness    Anus and perineum normal   Rectovaginal normal sphincter tone without palpated masses or tenderness.    Assessment/Plan:  60 y.o. G2P2 female for breast and pelvic exam.   1. Postmenopausal/atrophic genital changes. Doing well without significant hot flushes, night sweats, vaginal dryness or any vaginal bleeding. Continue to monitor and report any issues or vaginal bleeding. 2. Osteopenia. DEXA 07/2011 T score -1.5 distal third of the forearm. Normal at other measured sites.  Plan repeat DEXA next year at 5 year interval. 3. Mammography overdue. I reminded the patient to schedule now she agrees to do so. SBE monthly  reviewed. 4. Pap smear 2013. Pap smear done today. No history of abnormal Pap smears previously. 5. Colonoscopy 2012. Repeat at their recommended interval. 6. Skin rash, intermittent. Patient notes intermittent skin rash under her breasts and in her groin consistent with fungal. Exam today is normal. Uses OTC cream. Nystatin cream prescribed today to apply twice daily as needed. Will follow up if this does not help. 7. Vaginitis. Patient notes after antibiotic flare of vaginal discharge. Very itchy. Used OTC antifungal suppository with improvement. No urinary symptoms such as frequency dysuria or urgency. No discharge reported now that does have slight white discharge on exam. Diflucan 150 mg tablet 1 now to completely eradicate symptoms. Follow up if symptoms persist, worsen or recur. 8. Health maintenance. No routine lab work done as patient reports is done elsewhere. Follow up 1 year, sooner as needed.  Greater than 10 minutes of my time in excess of her breast and pelvic exam was spent in direct face to face counseling and coordination of care in regards to her problems of vaginitis and skin rash.  Anastasio Auerbach MD, 11:24 AM 11/04/2015

## 2015-11-04 NOTE — Addendum Note (Signed)
Addended by: Burnett Kanaris on: 11/04/2015 11:36 AM   Modules accepted: Orders

## 2015-11-05 ENCOUNTER — Encounter: Payer: Self-pay | Admitting: Gynecology

## 2015-11-05 LAB — PAP IG W/ RFLX HPV ASCU

## 2015-11-09 ENCOUNTER — Ambulatory Visit: Payer: Self-pay

## 2015-11-09 NOTE — Progress Notes (Signed)
NO SHOW for injection appointment

## 2015-11-17 ENCOUNTER — Ambulatory Visit (HOSPITAL_COMMUNITY)
Admission: EM | Admit: 2015-11-17 | Discharge: 2015-11-17 | Disposition: A | Discharge status: Home / Self Care | Payer: Medicare HMO | Attending: Family Medicine | Admitting: Family Medicine

## 2015-11-17 ENCOUNTER — Telehealth: Payer: Self-pay | Admitting: Hematology

## 2015-11-17 ENCOUNTER — Ambulatory Visit (INDEPENDENT_AMBULATORY_CARE_PROVIDER_SITE_OTHER): Payer: Medicare HMO

## 2015-11-17 ENCOUNTER — Encounter (HOSPITAL_COMMUNITY): Payer: Self-pay | Admitting: *Deleted

## 2015-11-17 ENCOUNTER — Ambulatory Visit (HOSPITAL_BASED_OUTPATIENT_CLINIC_OR_DEPARTMENT_OTHER): Payer: Medicare HMO

## 2015-11-17 VITALS — BP 125/77 | HR 99 | Temp 98.9°F | Resp 20

## 2015-11-17 DIAGNOSIS — D518 Other vitamin B12 deficiency anemias: Secondary | ICD-10-CM

## 2015-11-17 DIAGNOSIS — S92911A Unspecified fracture of right toe(s), initial encounter for closed fracture: Secondary | ICD-10-CM | POA: Diagnosis not present

## 2015-11-17 DIAGNOSIS — D510 Vitamin B12 deficiency anemia due to intrinsic factor deficiency: Secondary | ICD-10-CM

## 2015-11-17 MED ORDER — NAPROXEN 500 MG PO TABS: 500.0000 mg | ORAL_TABLET | Freq: Two times a day (BID) | ORAL | Status: DC

## 2015-11-17 MED ORDER — CYANOCOBALAMIN 1000 MCG/ML IJ SOLN
1000.0000 ug | Freq: Once | INTRAMUSCULAR | Status: AC
  Administered 2015-11-17: 1000 ug via INTRAMUSCULAR

## 2015-11-17 NOTE — Telephone Encounter (Signed)
Patient need to move inj out a week because was out of town, gave pt updated sched

## 2015-11-17 NOTE — Discharge Instructions (Signed)
°Cast or Splint Care  ° ° °Casts and splints support injured limbs and keep bones from moving while they heal. It is important to care for your cast or splint at home.  °HOME CARE INSTRUCTIONS  °Keep the cast or splint uncovered during the drying period. It can take 24 to 48 hours to dry if it is made of plaster. A fiberglass cast will dry in less than 1 hour.  °Do not rest the cast on anything harder than a pillow for the first 24 hours.  °Do not put weight on your injured limb or apply pressure to the cast until your health care provider gives you permission.  °Keep the cast or splint dry. Wet casts or splints can lose their shape and may not support the limb as well. A wet cast that has lost its shape can also create harmful pressure on your skin when it dries. Also, wet skin can become infected.  °Cover the cast or splint with a plastic bag when bathing or when out in the rain or snow. If the cast is on the trunk of the body, take sponge baths until the cast is removed.  °If your cast does become wet, dry it with a towel or a blow dryer on the cool setting only. °Keep your cast or splint clean. Soiled casts may be wiped with a moistened cloth.  °Do not place any hard or soft foreign objects under your cast or splint, such as cotton, toilet paper, lotion, or powder.  °Do not try to scratch the skin under the cast with any object. The object could get stuck inside the cast. Also, scratching could lead to an infection. If itching is a problem, use a blow dryer on a cool setting to relieve discomfort.  °Do not trim or cut your cast or remove padding from inside of it.  °Exercise all joints next to the injury that are not immobilized by the cast or splint. For example, if you have a long leg cast, exercise the hip joint and toes. If you have an arm cast or splint, exercise the shoulder, elbow, thumb, and fingers.  °Elevate your injured arm or leg on 1 or 2 pillows for the first 1 to 3 days to decrease swelling and  pain. It is best if you can comfortably elevate your cast so it is higher than your heart. °SEEK MEDICAL CARE IF:  °Your cast or splint cracks.  °Your cast or splint is too tight or too loose.  °You have unbearable itching inside the cast.  °Your cast becomes wet or develops a soft spot or area.  °You have a bad smell coming from inside your cast.  °You get an object stuck under your cast.  °Your skin around the cast becomes red or raw.  °You have new pain or worsening pain after the cast has been applied. °SEEK IMMEDIATE MEDICAL CARE IF:  °You have fluid leaking through the cast.  °You are unable to move your fingers or toes.  °You have discolored (blue or white), cool, painful, or very swollen fingers or toes beyond the cast.  °You have tingling or numbness around the injured area.  °You have severe pain or pressure under the cast.  °You have any difficulty with your breathing or have shortness of breath.  °You have chest pain. °This information is not intended to replace advice given to you by your health care provider. Make sure you discuss any questions you have with your health care provider.  °  Document Released: 04/15/2000 Document Revised: 02/06/2013 Document Reviewed: 10/25/2012  °Elsevier Interactive Patient Education ©2016 [employer]  ° °

## 2015-11-17 NOTE — ED Provider Notes (Signed)
CSN: [id number]     Arrival date & time 11/17/15  1357 History   First MD Initiated Contact with Patient 11/17/15 1506     Chief Complaint  Patient presents with  . Toe Injury   (Consider location/radiation/quality/duration/timing/severity/associated sxs/prior Treatment) Patient is a 60 y.o. female presenting with toe pain. The history is provided by the patient.  Toe Pain This is a new problem. The current episode started 1 to 2 hours ago. The problem occurs constantly. The problem has not changed since onset.Nothing aggravates the symptoms. Nothing relieves the symptoms. She has tried nothing for the symptoms.    Past Medical History  Diagnosis Date  . Anxiety   . Diabetes mellitus   . GERD (gastroesophageal reflux disease)   . Hyperlipidemia   . Hypertension   . Anal fissure   . Arthritis   . Chronic headaches   . Colon polyps   . Depression   . Gallstones   . IBS (irritable bowel syndrome)   . Obesity   . Status post cardiac catheterization   . Anemia 03/17/2011  . Osteopenia 07/2011    t score -1.5 FRAX 2.1%/0%  . Vertigo     Occurs 6 times a month  . Bursitis of hip   . Esophageal spasm   . Neuropathy ([place])   . Breast cancer [facility])    Past Surgical History  Procedure Laterality Date  . Appendectomy    . Cholecystectomy    . Gastric bypass    . Lower back surgery    . Left shouler surgery    . Tubal ligation    . Bilareral knee scopes    . Carpal tunnel release     Family History  Problem Relation Age of Onset  . Diabetes Mother   . Stroke Mother   . Heart disease Mother   . Hypertension Mother   . Hyperlipidemia Mother   . Anxiety disorder Mother   . Diabetes Brother   . Alcohol abuse Brother   . Drug abuse Brother   . Diabetes Maternal Aunt   . Diabetes Maternal Uncle   . Alcohol abuse Maternal Uncle   . Diabetes Maternal Grandmother   . Dementia Maternal Grandmother   . Depression Maternal Grandmother   . Alcohol abuse Father   . Cancer  Father     Stomach  . Alcohol abuse Paternal Uncle   . Cancer Paternal Uncle     Throat  . Alcohol abuse Maternal Grandfather   . Alcohol abuse Paternal Grandfather   . Alcohol abuse Maternal Uncle   . Alcohol abuse Paternal Uncle   . Alcohol abuse Paternal Uncle   . Cancer Paternal Aunt     Leukemia   Social History  Substance Use Topics  . Smoking status: Never Smoker   . Smokeless tobacco: Never Used  . Alcohol Use: No   OB History    Gravida Para Term Preterm AB TAB SAB Ectopic Multiple Living   2 2        2      Review of Systems  Constitutional: Negative.   HENT: Negative.   Eyes: Negative.   Respiratory: Negative.   Cardiovascular: Negative.   Gastrointestinal: Negative.   Endocrine: Negative.   Genitourinary: Negative.   Musculoskeletal: Positive for arthralgias.  Skin: Negative.   Allergic/Immunologic: Negative.   Neurological: Negative.   Hematological: Negative.   Psychiatric/Behavioral: Negative.     Allergies  Chicken allergy; Morphine and related; Codeine; Corn-containing products; Other; Peanut-containing drug products;  Sesame oil; Simvastatin; and Wheat  Home Medications   Prior to Admission medications   Medication Sig Start Date End Date Taking? Authorizing Provider  ACCU-CHEK FASTCLIX LANCETS MISC Use daily as directed for Dx: 250.00 02/10/15   Historical Provider, MD  ALPRAZolam Duanne Moron) 0.5 MG tablet Take 1 tablet by mouth 2 (two) times daily as needed. 01/21/14   Historical Provider, MD  amitriptyline (ELAVIL) 10 MG tablet Take 50 mg by mouth at bedtime.  02/21/13   Historical Provider, MD  aspirin-acetaminophen-caffeine (EXCEDRIN MIGRAINE) [phone] MG per tablet Take 1 tablet by mouth every 6 (six) hours as needed.    Historical Provider, MD  clobetasol ointment (TEMOVATE) 0.05 % 2 (two) times daily. On scalp bid 01/21/14   Historical Provider, MD  cyanocobalamin (,VITAMIN B-12,) 1000 MCG/ML injection Inject 1,000 mcg into the muscle every 30  (thirty) days.      Historical Provider, MD  cyclobenzaprine (FLEXERIL) 10 MG tablet Take 10 mg by mouth 3 (three) times daily as needed. 12/20/13   Historical Provider, MD  enalapril (VASOTEC) 20 MG tablet Take 1 tablet (20 mg total) by mouth daily. 05/16/11   Lucille Passy, MD  EPIPEN 2-PAK 0.3 MG/0.3ML DEVI as needed. Reported on 10/12/2015 08/15/12   Historical Provider, MD  fluconazole (DIFLUCAN) 150 MG tablet Take 1 tablet (150 mg total) by mouth once. 11/04/15   Anastasio Auerbach, MD  fluticasone (FLONASE) 50 MCG/ACT nasal spray Place 2 sprays into both nostrils 2 (two) times daily. 03/03/14   Historical Provider, MD  furosemide (LASIX) 20 MG tablet Take 1 tablet (20 mg total) by mouth 2 (two) times daily. 05/16/11   Lucille Passy, MD  glucose blood test strip Use as directed 02/19/15 02/19/16  Historical Provider, MD  hydrocortisone (PROCTOSOL HC) 2.5 % rectal cream Place 1 application rectally as needed. 08/18/11   Lucille Passy, MD  levothyroxine (SYNTHROID, LEVOTHROID) 25 MCG tablet Take 25 mcg by mouth. 07/22/14   Historical Provider, MD  Linaclotide (LINZESS) 290 MCG CAPS capsule Take 1 capsule by mouth daily.    Historical Provider, MD  meclizine (ANTIVERT) 25 MG tablet Take 25 mg by mouth 3 (three) times daily as needed.      Historical Provider, MD  metFORMIN (GLUCOPHAGE) 850 MG tablet Take 850 mg by mouth 2 (two) times daily with a meal.    Historical Provider, MD  nystatin cream (MYCOSTATIN) Apply 1 application topically 2 (two) times daily. 11/04/15   Anastasio Auerbach, MD  pantoprazole (PROTONIX) 20 MG tablet Take 20 mg by mouth daily.    Historical Provider, MD  polyethylene glycol powder (GLYCOLAX/MIRALAX) powder Take 17 g by mouth 2 (two) times daily. 08/18/11   Lucille Passy, MD  rosuvastatin (CRESTOR) 10 MG tablet Take 10 mg by mouth daily.     Historical Provider, MD  traMADol (ULTRAM) 50 MG tablet Take 50 mg by mouth 3 (three) times daily. 02/19/14   Historical Provider, MD  traZODone  (DESYREL) 100 MG tablet 1& 1/2 TABLETS BY MOUTH AT BEDTIME 08/07/13   Waldon Merl, MD  verapamil (CALAN) 80 MG tablet Take 1 tablet (80 mg total) by mouth daily. 05/16/11   Lucille Passy, MD   Meds Ordered and Administered this Visit  Medications - No data to display  BP 122/70 mmHg  Pulse 78  Temp(Src) 98.6 F (37 C) (Oral)  Resp 18  SpO2 98%  LMP 05/02/2006 No data found.   Physical Exam  Constitutional: She is  oriented to person, place, and time. She appears well-developed and well-nourished.  HENT:  Head: Normocephalic.  Eyes: Conjunctivae are normal. Pupils are equal, round, and reactive to light.  Cardiovascular: Normal rate, regular rhythm and normal heart sounds.   Pulmonary/Chest: Effort normal and breath sounds normal.  Abdominal: Soft. Bowel sounds are normal.  Musculoskeletal: She exhibits tenderness.  TTP right 4th and 5th toe w/o swelling or deformity  Neurological: She is alert and oriented to person, place, and time. She has normal reflexes.    ED Course  Procedures (including critical care time)  Labs Review Labs Reviewed - No data to display  Imaging Review No results found.   Visual Acuity Review  Right Eye Distance:   Left Eye Distance:   Bilateral Distance:    Right Eye Near:   Left Eye Near:    Bilateral Near:         MDM   Right distal proximal phalanx fracture - buddy taped and post op boot.  Naprosyn 500mg  one po bid x 10 days #20.  Follow up with orthopedics.       Lysbeth Penner, FNP 11/17/15 [id number]

## 2015-11-17 NOTE — Patient Instructions (Signed)

## 2015-11-17 NOTE — ED Notes (Signed)
Pt  Reports  She   Injured  Her     r  Little  Toe last  Pm   When  She  Bumped  It  On  An  Object   She  Has  Pain  And  Swelling of the  Affected  r  Small  Toe

## 2015-12-07 ENCOUNTER — Ambulatory Visit: Payer: Self-pay

## 2015-12-11 ENCOUNTER — Ambulatory Visit (INDEPENDENT_AMBULATORY_CARE_PROVIDER_SITE_OTHER): Payer: Medicare HMO | Admitting: Psychiatry

## 2015-12-11 ENCOUNTER — Encounter (HOSPITAL_COMMUNITY): Payer: Self-pay | Admitting: Psychiatry

## 2015-12-11 VITALS — BP 115/85 | Ht 65.0 in | Wt 220.0 lb

## 2015-12-11 DIAGNOSIS — G4700 Insomnia, unspecified: Secondary | ICD-10-CM

## 2015-12-11 DIAGNOSIS — F0630 Mood disorder due to known physiological condition, unspecified: Secondary | ICD-10-CM | POA: Diagnosis not present

## 2015-12-11 DIAGNOSIS — F411 Generalized anxiety disorder: Secondary | ICD-10-CM

## 2015-12-11 DIAGNOSIS — G894 Chronic pain syndrome: Secondary | ICD-10-CM | POA: Diagnosis not present

## 2015-12-11 DIAGNOSIS — F331 Major depressive disorder, recurrent, moderate: Secondary | ICD-10-CM

## 2015-12-11 MED ORDER — ESCITALOPRAM OXALATE 10 MG PO TABS: 10.0000 mg | ORAL_TABLET | Freq: Every day | ORAL | 0 refills | Status: DC

## 2015-12-11 NOTE — Progress Notes (Signed)
Psychiatric Initial Adult Assessment   Patient Identification: Kayla Mayo MRN:  [id number] Date of Evaluation:  12/11/2015 Referral Source: [name] and herself  Chief Complaint:   Visit Diagnosis:    ICD-9-CM ICD-10-CM   1. Mood disorder in conditions classified elsewhere 293.83 F06.30   2. Chronic pain syndrome 338.4 G89.4   3. Major depressive disorder, recurrent episode, moderate (HCC) 296.32 F33.1   4. GAD (generalized anxiety disorder) 300.02 F41.1   5. Insomnia 780.52 G47.00     History of Present Illness:  60 years old currently married African-American female referred for management of depression and anxiety she suffers from chronic medical illnesses including fibromyalgia, chronic back pain and multiple surgeries history of gastric bypass. She has seen dr. Rose Fillers before in this clinic 2015. At that time she was taking antidepressants and also trazodone apparently she stopped taking the antidepressant when the doctor left  She currently takes Xanax when necessary and also trazodone at night. She is referred because she is suffering from depression, tearfulness withdrawn feeling a motivated her medical condition keep her limited to walk. She feels down and depressed not hopeless or suicidal Husband is not helpful remains distant her pain is not adequately controlled which adds to her burden of distress. She is planning to have knee replacement surgery She worries, excessive. She is worried about her physical health her limitations she has typically sleeping takes trazodone at night and also amitriptyline Her depression and anxiety is getting worse and she is trying to make an appointment with pain management so her pain can be addressed.  Aggravating factor: distant husband. Multiple medical problems ( see list) limitations and pain Modifying factor: her will power. Her kids Severity of depression: 5/10. 10 being no depression   Associated Signs/Symptoms: Depression  Symptoms:  anhedonia, difficulty concentrating, anxiety, loss of energy/fatigue, disturbed sleep, (Hypo) Manic Symptoms:  Distractibility, Anxiety Symptoms:  Excessive Worry, Psychotic Symptoms:  denies PTSD Symptoms: NA  Past Psychiatric History: Outpatient treatement for depression in the remote past and her first husband was also getting separated and he was busy with a slight playing golf No past psychiatric admission or suicide attempts  Previous Psychotropic Medications: Yes  Most SSRI, wellbutrin  Substance Abuse History in the last 12 months:  No.  Consequences of Substance Abuse: NA  Past Medical History:  Past Medical History:  Diagnosis Date  . Anal fissure   . Anemia 03/17/2011  . Anxiety   . Arthritis   . Breast cancer ([place])   . Bursitis of hip   . Chronic headaches   . Colon polyps   . Depression   . Diabetes mellitus   . Esophageal spasm   . Gallstones   . GERD (gastroesophageal reflux disease)   . Hyperlipidemia   . Hypertension   . IBS (irritable bowel syndrome)   . Neuropathy ([place])   . Obesity   . Osteopenia 07/2011   t score -1.5 FRAX 2.1%/0%  . Status post cardiac catheterization   . Vertigo    Occurs 6 times a month    Past Surgical History:  Procedure Laterality Date  . APPENDECTOMY    . bilareral knee scopes    . CARPAL TUNNEL RELEASE    . CHOLECYSTECTOMY    . GASTRIC BYPASS    . left shouler surgery    . lower back surgery    . TUBAL LIGATION      Family Psychiatric History: not aware or denies  Family History:  Family History  Problem Relation Age of Onset  . Diabetes Mother   . Stroke Mother   . Heart disease Mother   . Hypertension Mother   . Hyperlipidemia Mother   . Anxiety disorder Mother   . Diabetes Brother   . Alcohol abuse Brother   . Drug abuse Brother   . Alcohol abuse Father   . Cancer Father     Stomach  . Diabetes Maternal Aunt   . Diabetes Maternal Uncle   . Alcohol abuse Maternal Uncle   .  Diabetes Maternal Grandmother   . Dementia Maternal Grandmother   . Depression Maternal Grandmother   . Alcohol abuse Paternal Uncle   . Cancer Paternal Uncle     Throat  . Alcohol abuse Maternal Grandfather   . Alcohol abuse Paternal Grandfather   . Alcohol abuse Maternal Uncle   . Alcohol abuse Paternal Uncle   . Alcohol abuse Paternal Uncle   . Cancer Paternal Aunt     Leukemia    Social History:   Social History   Social History  . Marital status: Married    Spouse name: N/A  . Number of children: N/A  . Years of education: N/A   Occupational History  . Disabled Unemployed   Social History Main Topics  . Smoking status: Never Smoker  . Smokeless tobacco: Never Used  . Alcohol use No  . Drug use: No  . Sexual activity: Not Currently    Partners: Male    Birth control/ protection: Post-menopausal, Surgical     Comment: Tubal lig   Other Topics Concern  . None   Social History Narrative  . None    Additional Social History: She grew up with her mom and brother to stop throwing up. She did finish high school. 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 Chicken Allergy     Sinus drainage  . Morphine And Related Itching  . Codeine   . Corn-Containing Products   . Other     Statins  . Peanut-Containing Drug Products   . Sesame Oil     Sinus drainage  . Simvastatin     Muscle pain  . Wheat     Sinus drainage    Metabolic Disorder Labs: Lab Results  Component Value Date   HGBA1C 6.6 (H) 03/23/2011   MPG 134 11/27/2008   No results found for: PROLACTIN Lab Results  Component Value Date   CHOL 183 03/23/2011   TRIG 69.0 03/23/2011   HDL  69.60 03/23/2011   CHOLHDL 3 03/23/2011   VLDL 13.8 03/23/2011   LDLCALC 100 (H) 03/23/2011   LDLCALC 113 (H) 08/24/2010     Current Medications: Current Outpatient Prescriptions  Medication Sig Dispense Refill  . ACCU-CHEK FASTCLIX LANCETS MISC Use daily as directed for Dx: 250.00    . ALPRAZolam (XANAX) 0.5 MG tablet Take 1 tablet by mouth 2 (two) times daily as needed.  2  . amitriptyline (ELAVIL) 10 MG tablet Take 50 mg by mouth at bedtime.     Marland Kitchen aspirin-acetaminophen-caffeine (EXCEDRIN MIGRAINE) 250-250-65 MG per tablet Take 1 tablet by mouth every 6 (six) hours as needed.    Marland Kitchen  clobetasol ointment (TEMOVATE) 0.05 % 2 (two) times daily. On scalp bid  2  . cyanocobalamin (,VITAMIN B-12,) 1000 MCG/ML injection Inject 1,000 mcg into the muscle every 30 (thirty) days.      . cyclobenzaprine (FLEXERIL) 10 MG tablet Take 10 mg by mouth 3 (three) times daily as needed.    . enalapril (VASOTEC) 20 MG tablet Take 1 tablet (20 mg total) by mouth daily. 90 tablet 3  . EPIPEN 2-PAK 0.3 MG/0.3ML DEVI as needed. Reported on 10/12/2015    . escitalopram (LEXAPRO) 10 MG tablet Take 1 tablet (10 mg total) by mouth daily. Start half tablet a day for first 1 week and then start one a day. 30 tablet 0  . fluconazole (DIFLUCAN) 150 MG tablet Take 1 tablet (150 mg total) by mouth once. 1 tablet 0  . fluticasone (FLONASE) 50 MCG/ACT nasal spray Place 2 sprays into both nostrils 2 (two) times daily.    . furosemide (LASIX) 20 MG tablet Take 1 tablet (20 mg total) by mouth 2 (two) times daily. 180 tablet 3  . glucose blood test strip Use as directed    . hydrocortisone (PROCTOSOL HC) 2.5 % rectal cream Place 1 application rectally as needed. 30 g 0  . levothyroxine (SYNTHROID, LEVOTHROID) 25 MCG tablet Take 25 mcg by mouth.    . Linaclotide (LINZESS) 290 MCG CAPS capsule Take 1 capsule by mouth daily.    . meclizine (ANTIVERT) 25 MG tablet Take 25 mg by mouth 3 (three) times daily as needed.      . metFORMIN  (GLUCOPHAGE) 850 MG tablet Take 850 mg by mouth 2 (two) times daily with a meal.    . naproxen (NAPROSYN) 500 MG tablet Take 1 tablet (500 mg total) by mouth 2 (two) times daily with a meal. 20 tablet 0  . nystatin cream (MYCOSTATIN) Apply 1 application topically 2 (two) times daily. 30 g 2  . pantoprazole (PROTONIX) 20 MG tablet Take 20 mg by mouth daily.    . polyethylene glycol powder (GLYCOLAX/MIRALAX) powder Take 17 g by mouth 2 (two) times daily. 255 g 0  . rosuvastatin (CRESTOR) 10 MG tablet Take 10 mg by mouth daily.     . traMADol (ULTRAM) 50 MG tablet Take 50 mg by mouth 3 (three) times daily.    . traZODone (DESYREL) 100 MG tablet 1& 1/2 TABLETS BY MOUTH AT BEDTIME 135 tablet 1  . verapamil (CALAN) 80 MG tablet Take 1 tablet (80 mg total) by mouth daily. 90 tablet 3   No current facility-administered medications for this visit.    Facility-Administered Medications Ordered in Other Visits  Medication Dose Route Frequency Provider Last Rate Last Dose  . cyanocobalamin ((VITAMIN B-12)) injection 1,000 mcg  1,000 mcg Intramuscular Q28 days Consuela Mimes, MD        Neurologic: Headache: No Seizure: No Paresthesias:No  Musculoskeletal: Strength & Muscle Tone: normal Gait & Station: unsteady Patient leans: Front  Psychiatric Specialty Exam: Review of Systems  Constitutional: Negative for fever.  Cardiovascular: Negative for chest pain.  Gastrointestinal: Negative for nausea.  Musculoskeletal: Positive for back pain, joint pain and myalgias.  Skin: Negative for rash.  Psychiatric/Behavioral: Positive for depression. Negative for substance abuse and suicidal ideas. The patient is nervous/anxious and has insomnia.     Blood pressure 115/85, height 5\' 5"  (1.651 m), weight 220 lb (99.8 kg), last menstrual period 05/02/2006.Body mass index is 36.61 kg/m.  General Appearance: Casual  Eye Contact:  Fair  Speech:  Slow  Volume:  Decreased  Mood:  Depressed and Dysphoric  Affect:   Constricted and Depressed  Thought Process:  Goal Directed  Orientation:  Full (Time, Place, and Person)  Thought Content:  Rumination  Suicidal Thoughts:  No  Homicidal Thoughts:  No  Memory:  Immediate;   Poor Recent;   Fair  Judgement:  Fair  Insight:  Shallow  Psychomotor Activity:  Decreased  Concentration:  Concentration: Fair and Attention Span: Fair  Recall:  [employer] of Knowledge:Fair  Language: Fair  Akathisia:  No  Handed:  Right  AIMS (if indicated):    Assets:  Desire for Improvement Resilience  ADL's:  Intact  Cognition: WNL  Sleep:  fair    Treatment Plan Summary: Medication management and Plan as follows  Mood disorder NOS: may be related with multiple medical concerns, pain , psychosocial issues.  Depression major, recurrent: will start low dose lexapro 5mg  increase to 10 mg in one week GAD: lexapro as above. Also taking xanax 0,5mg  qd prn InsomniaL: reviewed sleep hygiene. Also pending sleep study. Continue trazadone by primary care Avoid or cut down ellavil for concern of weight gain Pain condition: has appointment with pain management.   Also strongly recommend psychotherapy because of her distant relationship with her husband also dealing with the multiple medical issues and limitations in her mobility More then  50% of time spent in counseling and coordination which included patient education and review of side effects and complications concerns Patient call 911 or report to the emergency room for any urgent concerns or suicidal thoughts  Merian Capron, MD 8/11/20179:32 AM

## 2015-12-11 NOTE — Patient Instructions (Addendum)
Multiple medical concerns follow up with providers  Pain management needed and evaluate for sleep apnea Will start low dose lexapro for depression.  Consider to lower ellavil or amitryptilline for concern of weight gain and if not helping sleep or pain Will schedule for therapy for underlying stressors

## 2015-12-15 ENCOUNTER — Ambulatory Visit (HOSPITAL_BASED_OUTPATIENT_CLINIC_OR_DEPARTMENT_OTHER): Payer: Medicare HMO

## 2015-12-15 VITALS — BP 123/78 | HR 93 | Temp 99.2°F | Resp 20

## 2015-12-15 DIAGNOSIS — D518 Other vitamin B12 deficiency anemias: Secondary | ICD-10-CM

## 2015-12-15 DIAGNOSIS — D510 Vitamin B12 deficiency anemia due to intrinsic factor deficiency: Secondary | ICD-10-CM

## 2015-12-15 MED ORDER — CYANOCOBALAMIN 1000 MCG/ML IJ SOLN
1000.0000 ug | Freq: Once | INTRAMUSCULAR | Status: AC
  Administered 2015-12-15: 1000 ug via INTRAMUSCULAR

## 2015-12-15 NOTE — Patient Instructions (Signed)

## 2015-12-28 ENCOUNTER — Ambulatory Visit (INDEPENDENT_AMBULATORY_CARE_PROVIDER_SITE_OTHER): Payer: Medicare HMO | Admitting: Licensed Clinical Social Worker

## 2015-12-28 DIAGNOSIS — F411 Generalized anxiety disorder: Secondary | ICD-10-CM

## 2015-12-28 DIAGNOSIS — F331 Major depressive disorder, recurrent, moderate: Secondary | ICD-10-CM | POA: Diagnosis not present

## 2015-12-28 DIAGNOSIS — G894 Chronic pain syndrome: Secondary | ICD-10-CM

## 2015-12-28 DIAGNOSIS — Z630 Problems in relationship with spouse or partner: Secondary | ICD-10-CM

## 2015-12-30 ENCOUNTER — Encounter (HOSPITAL_COMMUNITY): Payer: Self-pay | Admitting: Licensed Clinical Social Worker

## 2015-12-30 DIAGNOSIS — G894 Chronic pain syndrome: Secondary | ICD-10-CM | POA: Insufficient documentation

## 2015-12-30 DIAGNOSIS — Z630 Problems in relationship with spouse or partner: Secondary | ICD-10-CM | POA: Insufficient documentation
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
## 2016-01-05 ENCOUNTER — Other Ambulatory Visit: Payer: Self-pay

## 2016-01-05 ENCOUNTER — Ambulatory Visit: Payer: Self-pay

## 2016-01-08 ENCOUNTER — Ambulatory Visit (INDEPENDENT_AMBULATORY_CARE_PROVIDER_SITE_OTHER): Payer: Medicare HMO | Admitting: Psychiatry

## 2016-01-08 ENCOUNTER — Encounter (HOSPITAL_COMMUNITY): Payer: Self-pay | Admitting: Psychiatry

## 2016-01-08 VITALS — BP 116/68 | HR 73 | Ht 65.0 in | Wt 221.0 lb

## 2016-01-08 DIAGNOSIS — Z630 Problems in relationship with spouse or partner: Secondary | ICD-10-CM

## 2016-01-08 DIAGNOSIS — G4700 Insomnia, unspecified: Secondary | ICD-10-CM

## 2016-01-08 DIAGNOSIS — F331 Major depressive disorder, recurrent, moderate: Secondary | ICD-10-CM

## 2016-01-08 DIAGNOSIS — F411 Generalized anxiety disorder: Secondary | ICD-10-CM | POA: Diagnosis not present

## 2016-01-08 DIAGNOSIS — F0630 Mood disorder due to known physiological condition, unspecified: Secondary | ICD-10-CM

## 2016-01-08 DIAGNOSIS — G894 Chronic pain syndrome: Secondary | ICD-10-CM

## 2016-01-08 MED ORDER — ESCITALOPRAM OXALATE 10 MG PO TABS: 10.0000 mg | ORAL_TABLET | Freq: Every day | ORAL | 1 refills | Status: DC

## 2016-01-08 NOTE — Progress Notes (Signed)
[facility] Outpatient Follow up visit  Patient Identification: Kayla Mayo MRN:  [id number] Date of Evaluation:  01/08/2016 Referral Source: [name] and herself  Chief Complaint:   Chief Complaint    Follow-up     Visit Diagnosis:    ICD-9-CM ICD-10-CM   1. Major depressive disorder, recurrent episode, moderate (HCC) 296.32 F33.1   2. GAD (generalized anxiety disorder) 300.02 F41.1   3. Chronic pain syndrome 338.4 G89.4   4. Relationship problem between partners V61.10 Z63.0   5. Mood disorder in conditions classified elsewhere 293.83 F06.30   6. Insomnia 780.52 G47.00     History of Present Illness:  60 years old currently married African-American female initially referred for management of depression and anxiety she suffers from chronic medical illnesses including fibromyalgia, chronic back pain and multiple surgeries history of gastric bypass.   She has seen dr. Rose Fillers before in this clinic 2015. At that time she was taking antidepressants and also trazodone apparently she stopped taking the antidepressant when the doctor left  She is suffering from depression and anxiety crying spells related to her pain and back condition. Her husband was distant. Apparently she is improved on Lexapro she feels better improved energy level is improved but her husband still remains distant but she is not having significant mood swings and is tolerating medication  Aggravating factor: distant husband. Multiple medical problems ( see list) limitations and pain Modifying factor: her will power. Her kids Severity of depression: 7/10. 10 being no depression   Associated Signs/Symptoms: Depression Symptoms:  Improved. Less sad  (Hypo) Manic Symptoms:  Distractibility, Anxiety Symptoms:  Excessive Worry, (improved) Psychotic Symptoms:  denies PTSD Symptoms: NA   Substance Abuse History in the last 12 months:  No.  Consequences of Substance Abuse: NA  Past Medical History:  Past  Medical History:  Diagnosis Date  . Anal fissure   . Anemia 03/17/2011  . Anxiety   . Arthritis   . Breast cancer ([place])   . Bursitis of hip   . Chronic headaches   . Colon polyps   . Depression   . Diabetes mellitus   . Esophageal spasm   . Gallstones   . GERD (gastroesophageal reflux disease)   . Hyperlipidemia   . Hypertension   . IBS (irritable bowel syndrome)   . Neuropathy ([place])   . Obesity   . Osteopenia 07/2011   t score -1.5 FRAX 2.1%/0%  . Status post cardiac catheterization   . Vertigo    Occurs 6 times a month    Past Surgical History:  Procedure Laterality Date  . APPENDECTOMY    . bilareral knee scopes    . CARPAL TUNNEL RELEASE    . CHOLECYSTECTOMY    . GASTRIC BYPASS    . left shouler surgery    . lower back surgery    . TUBAL LIGATION      Family Psychiatric History: not aware or denies  Family History:  Family History  Problem Relation Age of Onset  . Diabetes Mother   . Stroke Mother   . Heart disease Mother   . Hypertension Mother   . Hyperlipidemia Mother   . Anxiety disorder Mother   . Diabetes Brother   . Alcohol abuse Brother   . Drug abuse Brother   . Alcohol abuse Father   . Cancer Father     Stomach  . Diabetes Maternal Aunt   . Diabetes Maternal Uncle   . Alcohol abuse Maternal Uncle   .  Diabetes Maternal Grandmother   . Dementia Maternal Grandmother   . Depression Maternal Grandmother   . Alcohol abuse Paternal Uncle   . Cancer Paternal Uncle     Throat  . Alcohol abuse Maternal Grandfather   . Alcohol abuse Paternal Grandfather   . Alcohol abuse Maternal Uncle   . Alcohol abuse Paternal Uncle   . Alcohol abuse Paternal Uncle   . Cancer Paternal Aunt     Leukemia    Social History:   Social History   Social History  . Marital status: Married    Spouse name: N/A  . Number of children: N/A  . Years of education: N/A   Occupational History  . Disabled Unemployed   Social History Main Topics  . Smoking  status: Never Smoker  . Smokeless tobacco: Never Used  . Alcohol use No  . Drug use: No  . Sexual activity: Not Currently    Partners: Male    Birth control/ protection: Post-menopausal, Surgical     Comment: Tubal lig   Other Topics Concern  . None   Social History Narrative  . None     Allergies:   Allergies  Allergen Reactions  . Chicken Allergy     Sinus drainage  . Morphine And Related Itching  . Codeine   . Corn-Containing Products   . Other     Statins  . Peanut-Containing Drug Products   . Sesame Oil     Sinus drainage  . Simvastatin     Muscle pain  . Wheat     Sinus drainage    Metabolic Disorder Labs: Lab Results  Component Value Date   HGBA1C 6.6 (H) 03/23/2011   MPG 134 11/27/2008   No results found for: PROLACTIN Lab Results  Component Value Date   CHOL 183 03/23/2011   TRIG 69.0 03/23/2011   HDL 69.60 03/23/2011   CHOLHDL 3 03/23/2011   VLDL 13.8 03/23/2011   LDLCALC 100 (H) 03/23/2011   LDLCALC 113 (H) 08/24/2010     Current Medications: Current Outpatient Prescriptions  Medication Sig Dispense Refill  . ACCU-CHEK FASTCLIX LANCETS MISC Use daily as directed for Dx: 250.00    . aspirin-acetaminophen-caffeine (EXCEDRIN MIGRAINE) 250-250-65 MG per tablet Take 1 tablet by mouth every 6 (six) hours as needed.    . clobetasol ointment (TEMOVATE) 0.05 % 2 (two) times daily. On scalp bid  2  . cyanocobalamin (,VITAMIN B-12,) 1000 MCG/ML injection Inject 1,000 mcg into the muscle every 30 (thirty) days.      . cyclobenzaprine (FLEXERIL) 10 MG tablet Take 10 mg by mouth 3 (three) times daily as needed.    . enalapril (VASOTEC) 20 MG tablet Take 1 tablet (20 mg total) by mouth daily. 90 tablet 3  . EPIPEN 2-PAK 0.3 MG/0.3ML DEVI as needed. Reported on 10/12/2015    . escitalopram (LEXAPRO) 10 MG tablet Take 1 tablet (10 mg total) by mouth daily. one a day. 30 tablet 1  . escitalopram (LEXAPRO) 10 MG tablet Take 1 tablet (10 mg total) by mouth  daily. one a day. 30 tablet 1  . fluconazole (DIFLUCAN) 150 MG tablet Take 1 tablet (150 mg total) by mouth once. 1 tablet 0  . fluticasone (FLONASE) 50 MCG/ACT nasal spray Place 2 sprays into both nostrils 2 (two) times daily.    . furosemide (LASIX) 20 MG tablet Take 1 tablet (20 mg total) by mouth 2 (two) times daily. 180 tablet 3  . glucose blood test strip Use as  directed    . hydrocortisone (PROCTOSOL HC) 2.5 % rectal cream Place 1 application rectally as needed. 30 g 0  . levothyroxine (SYNTHROID, LEVOTHROID) 25 MCG tablet Take 25 mcg by mouth.    . Linaclotide (LINZESS) 290 MCG CAPS capsule Take 1 capsule by mouth daily.    . meclizine (ANTIVERT) 25 MG tablet Take 25 mg by mouth 3 (three) times daily as needed.      . metFORMIN (GLUCOPHAGE) 850 MG tablet Take 850 mg by mouth 2 (two) times daily with a meal.    . naproxen (NAPROSYN) 500 MG tablet Take 1 tablet (500 mg total) by mouth 2 (two) times daily with a meal. 20 tablet 0  . nystatin cream (MYCOSTATIN) Apply 1 application topically 2 (two) times daily. 30 g 2  . pantoprazole (PROTONIX) 20 MG tablet Take 20 mg by mouth daily.    . polyethylene glycol powder (GLYCOLAX/MIRALAX) powder Take 17 g by mouth 2 (two) times daily. 255 g 0  . rosuvastatin (CRESTOR) 10 MG tablet Take 10 mg by mouth daily.     . traMADol (ULTRAM) 50 MG tablet Take 50 mg by mouth 3 (three) times daily.    . traZODone (DESYREL) 100 MG tablet 1& 1/2 TABLETS BY MOUTH AT BEDTIME 135 tablet 1  . verapamil (CALAN) 80 MG tablet Take 1 tablet (80 mg total) by mouth daily. 90 tablet 3   No current facility-administered medications for this visit.    Facility-Administered Medications Ordered in Other Visits  Medication Dose Route Frequency Provider Last Rate Last Dose  . cyanocobalamin ((VITAMIN B-12)) injection 1,000 mcg  1,000 mcg Intramuscular Q28 days Marcy Panning, MD        Neurologic: Headache: No Seizure: No Paresthesias:No  Musculoskeletal: Strength &  Muscle Tone: normal Gait & Station: unsteady Patient leans: Front  Psychiatric Specialty Exam: Review of Systems  Constitutional: Negative for fever.  Cardiovascular: Negative for chest pain.  Gastrointestinal: Negative for nausea.  Musculoskeletal: Positive for back pain, joint pain and myalgias.  Skin: Negative for rash.  Psychiatric/Behavioral: Negative for substance abuse and suicidal ideas. The patient has insomnia.     Blood pressure 116/68, pulse 73, height 5\' 5"  (1.651 m), weight 221 lb (100.2 kg), last menstrual period 05/02/2006, SpO2 98 %.Body mass index is 36.78 kg/m.  General Appearance: Casual  Eye Contact:  Fair  Speech:  Slow  Volume:  Decreased  Mood:  Euthymic and improved  Affect:  Improved and reactive  Thought Process:  Goal Directed  Orientation:  Full (Time, Place, and Person)  Thought Content:  Rumination  Suicidal Thoughts:  No  Homicidal Thoughts:  No  Memory:  Immediate;   Poor Recent;   Fair  Judgement:  Fair  Insight:  Shallow  Psychomotor Activity:  Decreased  Concentration:  Concentration: Fair and Attention Span: Fair  Recall:  [employer] of Knowledge:Fair  Language: Fair  Akathisia:  No  Handed:  Right  AIMS (if indicated):    Assets:  Desire for Improvement Resilience  ADL's:  Intact  Cognition: WNL  Sleep:  fair    Treatment Plan Summary: Medication management and Plan as follows  Mood disorder NOS: may be related with multiple medical concerns, pain , psychosocial issues.  Depression major, recurrent: improved on lexapro. Will refill. GAD: lexapro as above. Also taking xanax 0,5mg  qd prn InsomniaL: reviewed sleep hygiene. Also pending sleep study. Continue trazadone by primary care  Pain condition: has appointment with pain management.    recommend  psychotherapy because of her distant relationship with her husband also dealing with the multiple medical issues and limitations in her mobility More then  50% of time spent in  counseling and coordination which included patient education and review of side effects and complications concerns Patient call 911 or report to the emergency room for any urgent concerns or suicidal thoughts Time spent: 25 minutes  Merian Capron, MD 9/8/201712:07 PM

## 2016-01-11 ENCOUNTER — Ambulatory Visit (INDEPENDENT_AMBULATORY_CARE_PROVIDER_SITE_OTHER): Payer: Medicare HMO | Admitting: Licensed Clinical Social Worker

## 2016-01-11 DIAGNOSIS — F411 Generalized anxiety disorder: Secondary | ICD-10-CM | POA: Diagnosis not present

## 2016-01-11 DIAGNOSIS — G894 Chronic pain syndrome: Secondary | ICD-10-CM

## 2016-01-11 DIAGNOSIS — F330 Major depressive disorder, recurrent, mild: Secondary | ICD-10-CM
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
## 2016-01-12 ENCOUNTER — Other Ambulatory Visit (HOSPITAL_BASED_OUTPATIENT_CLINIC_OR_DEPARTMENT_OTHER): Payer: Medicare HMO

## 2016-01-12 ENCOUNTER — Ambulatory Visit (HOSPITAL_BASED_OUTPATIENT_CLINIC_OR_DEPARTMENT_OTHER): Payer: Medicare HMO

## 2016-01-12 VITALS — BP 145/88 | HR 76 | Temp 98.8°F | Resp 18

## 2016-01-12 DIAGNOSIS — D510 Vitamin B12 deficiency anemia due to intrinsic factor deficiency: Secondary | ICD-10-CM

## 2016-01-12 DIAGNOSIS — D518 Other vitamin B12 deficiency anemias: Secondary | ICD-10-CM | POA: Diagnosis not present

## 2016-01-12 DIAGNOSIS — D6489 Other specified anemias: Secondary | ICD-10-CM

## 2016-01-12 LAB — COMPREHENSIVE METABOLIC PANEL
ALT: 27 U/L (ref 0–55)
ANION GAP: 10 meq/L (ref 3–11)
AST: 23 U/L (ref 5–34)
Albumin: 3.9 g/dL (ref 3.5–5.0)
Alkaline Phosphatase: 84 U/L (ref 40–150)
BUN: 11.5 mg/dL (ref 7.0–26.0)
CHLORIDE: 106 meq/L (ref 98–109)
CO2: 26 meq/L (ref 22–29)
Calcium: 9.3 mg/dL (ref 8.4–10.4)
Creatinine: 0.8 mg/dL (ref 0.6–1.1)
Glucose: 165 mg/dl — ABNORMAL HIGH (ref 70–140)
Potassium: 3.9 mEq/L (ref 3.5–5.1)
Sodium: 142 mEq/L (ref 136–145)
Total Bilirubin: 0.6 mg/dL (ref 0.20–1.20)
Total Protein: 7 g/dL (ref 6.4–8.3)

## 2016-01-12 LAB — CBC & DIFF AND RETIC
BASO%: 0.7 % (ref 0.0–2.0)
Basophils Absolute: 0 10*3/uL (ref 0.0–0.1)
EOS%: 5.5 % (ref 0.0–7.0)
Eosinophils Absolute: 0.2 10*3/uL (ref 0.0–0.5)
HCT: 36.7 % (ref 34.8–46.6)
HGB: 11.9 g/dL (ref 11.6–15.9)
Immature Retic Fract: 4.6 % (ref 1.60–10.00)
LYMPH#: 1.2 10*3/uL (ref 0.9–3.3)
LYMPH%: 29.8 % (ref 14.0–49.7)
MCH: 30.7 pg (ref 25.1–34.0)
MCHC: 32.4 g/dL (ref 31.5–36.0)
MCV: 94.6 fL (ref 79.5–101.0)
MONO#: 0.3 10*3/uL (ref 0.1–0.9)
MONO%: 6 % (ref 0.0–14.0)
NEUT%: 58 % (ref 38.4–76.8)
NEUTROS ABS: 2.4 10*3/uL (ref 1.5–6.5)
PLATELETS: 235 10*3/uL (ref 145–400)
RBC: 3.88 10*6/uL (ref 3.70–5.45)
RDW: 13.8 % (ref 11.2–14.5)
RETIC CT ABS: 77.21 10*3/uL (ref 33.70–90.70)
Retic %: 1.99 % (ref 0.70–2.10)
WBC: 4.2 10*3/uL (ref 3.9–10.3)

## 2016-01-12 LAB — IRON AND TIBC
%SAT: 42 % (ref 21–57)
IRON: 108 ug/dL (ref 41–142)
TIBC: 260 ug/dL (ref 236–444)
UIBC: 152 ug/dL (ref 120–384)

## 2016-01-12 LAB — FERRITIN: Ferritin: 195 ng/ml (ref 9–269)

## 2016-01-12 MED ORDER — CYANOCOBALAMIN 1000 MCG/ML IJ SOLN
1000.0000 ug | Freq: Once | INTRAMUSCULAR | Status: AC
  Administered 2016-01-12: 1000 ug via INTRAMUSCULAR

## 2016-01-12 NOTE — Patient Instructions (Signed)

## 2016-01-13 LAB — VITAMIN B12: VITAMIN B 12: 608 pg/mL (ref 211–946)

## 2016-01-18 ENCOUNTER — Telehealth: Payer: Self-pay | Admitting: *Deleted

## 2016-01-18 NOTE — Telephone Encounter (Signed)
TC to patient to inform her that her recent iron studies came back WNL. No need for IV fereheme. Spoke with patient. She voiced understanding. No questions or concerns at this time.

## 2016-01-21 ENCOUNTER — Ambulatory Visit (INDEPENDENT_AMBULATORY_CARE_PROVIDER_SITE_OTHER): Payer: Medicare HMO | Admitting: Neurology

## 2016-01-21 ENCOUNTER — Encounter: Payer: Self-pay | Admitting: Neurology

## 2016-01-21 VITALS — BP 116/68 | HR 78 | Resp 16 | Ht 66.0 in | Wt 222.0 lb

## 2016-01-21 DIAGNOSIS — G4733 Obstructive sleep apnea (adult) (pediatric): Secondary | ICD-10-CM

## 2016-01-21 DIAGNOSIS — E669 Obesity, unspecified: Secondary | ICD-10-CM | POA: Diagnosis not present

## 2016-01-21 DIAGNOSIS — G2581 Restless legs syndrome: Secondary | ICD-10-CM | POA: Diagnosis not present

## 2016-01-21 DIAGNOSIS — G4761 Periodic limb movement disorder: Secondary | ICD-10-CM | POA: Diagnosis not present

## 2016-01-21 DIAGNOSIS — R519 Headache, unspecified: Secondary | ICD-10-CM

## 2016-01-21 DIAGNOSIS — R51 Headache: Secondary | ICD-10-CM

## 2016-01-21 NOTE — Patient Instructions (Signed)

## 2016-01-21 NOTE — Progress Notes (Signed)
Subjective:    Patient ID: Kayla Mayo is a 60 y.o. female.  HPI     Star Age, MD, PhD [facility] [address], [address] [place], [place] [address]  Dear Dr. Glennon Mac,   I saw your patient, Kayla Mayo, upon your kind request in my neurologic clinic today for initial consultation of her sleep disorder, in particular, concern for underlying obstructive sleep apnea. The patient is accompanied by her husband today. As you know, Kayla Mayo is a 60 year old right-handed woman with an underlying medical history of hypertension, hyperlipidemia, diabetes, status post gastric bypass surgery, obesity, vitamin B12 deficiency, iron deficiency anemia, chronic back pain, reflux disease, anxiety, depression, IBS, and recent emergency room visit on 11/17/2015 for fractured right toe, who reports snoring and excessive daytime somnolence. I reviewed your office note from 12/08/2015. I reviewed blood work from 10/22/2015. CMP was unremarkable with the exception of glucose at 151. Lipid panel showed cholesterol of 195, triglycerides 91, LDL 91. Hemoglobin A1c was 6.5. CBC with differential was unremarkable. B12 level was 833. Ferritin was 309. She was diagnosed with obstructive sleep apnea years ago and was advised to use CPAP therapy, but did not pursue it at the time. She then had bariatric surgery and lost about 100 pounds and had a repeat sleep study in or around 2009 and was told at the time that she no longer needed CPAP therapy. She complains of difficulty maintaining sleep, and daytime somnolence. Her Epworth sleepiness score is 2 out of 24 today, her fatigue score is 61 out of 63. She lives with her husband. She has 2 grown children. She does not work.  She wakes up with cough around 3 AM. She goes to bed around 10 PM, takes trazodone 100 mg, but wakes up early AM. She goes to the bathroom once. Husband works nights.  She gets out of bed by 7:30. She has occasional  morning HAs. She rarely can take a nap.  She was an Astronomer and a back teller.  She has LBP, sees Dr. Emmaline Kluver, pending RFA in 10/17 in [place].  She is a lifelong non-smoker, does not drink alchohol or caffeine.  She endorses restless leg symptoms and moving a lot with her legs at night, sometimes legs jerk at night which wakes her up.   Her Past Medical History Is Significant For: Past Medical History:  Diagnosis Date  . Anal fissure   . Anemia 03/17/2011  . Anxiety   . Arthritis   . Breast cancer ([place])   . Bursitis of hip   . Chronic headaches   . Colon polyps   . Depression   . Diabetes mellitus   . Esophageal spasm   . Gallstones   . GERD (gastroesophageal reflux disease)   . Hyperlipidemia   . Hypertension   . IBS (irritable bowel syndrome)   . Neuropathy ([place])   . Obesity   . Osteopenia 07/2011   t score -1.5 FRAX 2.1%/0%  . Status post cardiac catheterization   . Vertigo    Occurs 6 times a month    Her Past Surgical History Is Significant For: Past Surgical History:  Procedure Laterality Date  . APPENDECTOMY    . bilareral knee scopes    . CARPAL TUNNEL RELEASE    . CHOLECYSTECTOMY    . GASTRIC BYPASS    . left shouler surgery    . lower back surgery    . TUBAL LIGATION  Her Family History Is Significant For: Family History  Problem Relation Age of Onset  . Diabetes Mother   . Stroke Mother   . Heart disease Mother   . Hypertension Mother   . Hyperlipidemia Mother   . Anxiety disorder Mother   . Diabetes Brother   . Alcohol abuse Brother   . Drug abuse Brother   . Alcohol abuse Father   . Cancer Father     Stomach  . Diabetes Maternal Aunt   . Diabetes Maternal Uncle   . Alcohol abuse Maternal Uncle   . Diabetes Maternal Grandmother   . Dementia Maternal Grandmother   . Depression Maternal Grandmother   . Alcohol abuse Paternal Uncle   . Cancer Paternal Uncle     Throat  . Alcohol abuse Maternal  Grandfather   . Alcohol abuse Paternal Grandfather   . Alcohol abuse Maternal Uncle   . Alcohol abuse Paternal Uncle   . Alcohol abuse Paternal Uncle   . Cancer Paternal Aunt     Leukemia    Her Social History Is Significant For: Social History   Social History  . Marital status: Married    Spouse name: N/A  . Number of children: 2  . Years of education: 14   Occupational History  . Disabled Unemployed   Social History Main Topics  . Smoking status: Never Smoker  . Smokeless tobacco: Never Used  . Alcohol use No  . Drug use: No  . Sexual activity: Not Currently    Partners: Male    Birth control/ protection: Post-menopausal, Surgical     Comment: Tubal lig   Other Topics Concern  . None   Social History Narrative   Denies caffeine use     Her Allergies Are:  Allergies  Allergen Reactions  . Chicken Allergy     Sinus drainage  . Morphine And Related Itching  . Codeine   . Corn-Containing Products   . Other     Statins  . Peanut-Containing Drug Products   . Sesame Oil     Sinus drainage  . Simvastatin     Muscle pain  . Wheat     Sinus drainage  :   Her Current Medications Are:  Outpatient Encounter Prescriptions as of 01/21/2016  Medication Sig  . ACCU-CHEK FASTCLIX LANCETS MISC Use daily as directed for Dx: 250.00  . aspirin 81 MG tablet Take 81 mg by mouth daily.  . clobetasol ointment (TEMOVATE) 0.05 % 2 (two) times daily. On scalp bid  . cyanocobalamin (,VITAMIN B-12,) 1000 MCG/ML injection Inject 1,000 mcg into the muscle every 30 (thirty) days.    . cyclobenzaprine (FLEXERIL) 10 MG tablet Take 10 mg by mouth 3 (three) times daily as needed.  . diclofenac sodium (VOLTAREN) 1 % GEL APPLY 2g 4 times a day applied topically 30 day(s)  . EPIPEN 2-PAK 0.3 MG/0.3ML DEVI as needed. Reported on 10/12/2015  . escitalopram (LEXAPRO) 10 MG tablet Take 1 tablet (10 mg total) by mouth daily. one a day.  . escitalopram (LEXAPRO) 10 MG tablet Take by mouth.  .  fluticasone (FLONASE) 50 MCG/ACT nasal spray Place 2 sprays into both nostrils 2 (two) times daily.  . furosemide (LASIX) 20 MG tablet Take 1 tablet (20 mg total) by mouth 2 (two) times daily.  Marland Kitchen glucose blood test strip Use as directed  . HYDROcodone-acetaminophen (NORCO) 7.5-325 MG tablet Take 1 tablet by mouth 3 (three) times daily as needed for moderate pain.  . hydrocortisone (  PROCTOSOL HC) 2.5 % rectal cream Place 1 application rectally as needed.  Marland Kitchen levothyroxine (SYNTHROID, LEVOTHROID) 25 MCG tablet Take 25 mcg by mouth.  . Linaclotide (LINZESS) 290 MCG CAPS capsule Take 1 capsule by mouth daily.  . meclizine (ANTIVERT) 25 MG tablet Take 25 mg by mouth 3 (three) times daily as needed.    . metFORMIN (GLUCOPHAGE) 850 MG tablet Take 850 mg by mouth 2 (two) times daily with a meal.  . naproxen (NAPROSYN) 500 MG tablet Take 1 tablet (500 mg total) by mouth 2 (two) times daily with a meal.  . pantoprazole (PROTONIX) 20 MG tablet Take 40 mg by mouth daily.   . polyethylene glycol powder (GLYCOLAX/MIRALAX) powder Take 17 g by mouth 2 (two) times daily.  . rosuvastatin (CRESTOR) 10 MG tablet Take 10 mg by mouth daily.   . traZODone (DESYREL) 100 MG tablet 1& 1/2 TABLETS BY MOUTH AT BEDTIME  . valsartan (DIOVAN) 80 MG tablet   . verapamil (CALAN) 80 MG tablet Take 1 tablet (80 mg total) by mouth daily.  . [DISCONTINUED] ALPRAZolam (XANAX) 0.5 MG tablet Take 0.5 mg by mouth 2 (two) times daily.  . [DISCONTINUED] amitriptyline (ELAVIL) 100 MG tablet Take by mouth.  . [DISCONTINUED] fluconazole (DIFLUCAN) 150 MG tablet Take 1 tablet (150 mg total) by mouth once.  . [DISCONTINUED] HYDROcodone-acetaminophen (NORCO/VICODIN) 5-325 MG tablet TK 1 T PO TID PRN  . [DISCONTINUED] nystatin cream (MYCOSTATIN) Apply 1 application topically 2 (two) times daily.  . [DISCONTINUED] traMADol (ULTRAM) 50 MG tablet Take 50 mg by mouth 3 (three) times daily.  . [DISCONTINUED] traMADol (ULTRAM-ER) 300 MG 24 hr tablet  Take by mouth.   Facility-Administered Encounter Medications as of 01/21/2016  Medication  . cyanocobalamin ((VITAMIN B-12)) injection 1,000 mcg  :  Review of Systems:  Out of a complete 14 point review of systems, all are reviewed and negative with the exception of these symptoms as listed below: Review of Systems  Neurological:       Patient has had sleep studies done. After 1st one she was told that she needed CPAP. She then had bariatric surgery and lost 100 lbs. After 2nd study she was told that she does not need CPAP (2009).    Patient has trouble falling and staying asleep, snoring, wakes up feeling tired, restless legs, wakes up daily with headaches, daytime fatigue, denies taking naps.    Epworth Sleepiness Scale 0= would never doze 1= slight chance of dozing 2= moderate chance of dozing 3= high chance of dozing  Sitting and reading:0 Watching TV:0 Sitting inactive in a public place (ex. Theater or meeting):0 As a passenger in a car for an hour without a break:0 Lying down to rest in the afternoon:2 Sitting and talking to someone:0 Sitting quietly after lunch (no alcohol):0 In a car, while stopped in traffic:0 Total:2  Objective:  Neurologic Exam  Physical Exam Physical Examination:   Vitals:   01/21/16 1005  BP: 116/68  Pulse: 78  Resp: 16   General Examination: The patient is a very pleasant 60 y.o. female in no acute distress. She appears well-developed and well-nourished and adequately groomed.   HEENT: Normocephalic, atraumatic, pupils are equal, round and reactive to light and accommodation. Funduscopic exam is normal with sharp disc margins noted. Extraocular tracking is good without limitation to gaze excursion or nystagmus noted. Normal smooth pursuit is noted. Hearing is grossly intact. Face is symmetric with normal facial animation and normal facial sensation. Speech is clear with  no dysarthria noted. There is no hypophonia. There is no lip, neck/head,  jaw or voice tremor. Neck is supple with full range of passive and active motion. There are no carotid bruits on auscultation. Oropharynx exam reveals: mild mouth dryness, adequate dental hygiene and moderate airway crowding, due to larger tongue, larger uvula, tonsils of 2+. Mallampati is class II. Tongue protrudes centrally and palate elevates symmetrically. Neck size is 14.25 inches. She has a Moderate overbite. Nasal inspection reveals no significant nasal mucosal bogginess or redness and no septal deviation.   Chest: Clear to auscultation without wheezing, rhonchi or crackles noted.  Heart: S1+S2+0, regular and normal without murmurs, rubs or gallops noted.   Abdomen: Soft, non-tender and non-distended with normal bowel sounds appreciated on auscultation.  Extremities: There is no pitting edema in the distal lower extremities bilaterally. Pedal pulses are intact.  Skin: Warm and dry without trophic changes noted. There are no varicose veins.  Musculoskeletal: exam reveals no obvious joint deformities, tenderness or joint swelling or erythema, with the exception of bilateral knee pain. She also reports low back pain. She walks with a cane.   Neurologically:  Mental status: The patient is awake, alert and oriented in all 4 spheres. Her immediate and remote memory, attention, language skills and fund of knowledge are appropriate. There is no evidence of aphasia, agnosia, apraxia or anomia. Speech is clear with normal prosody and enunciation. Thought process is linear. Mood is normal and affect is normal.  Cranial nerves II - XII are as described above under HEENT exam. In addition: shoulder shrug is normal with equal shoulder height noted. Motor exam: Normal bulk, strength and tone is noted. There is no drift, tremor or rebound. Romberg is negative. Reflexes are 1-2+ throughout. Babinski: Toes are flexor bilaterally. Fine motor skills and coordination: intact with normal finger taps, normal hand  movements, normal rapid alternating patting, normal foot taps and normal foot agility.  Cerebellar testing: No dysmetria or intention tremor on finger to nose testing. Heel to shin is unremarkable bilaterally. There is no truncal or gait ataxia.  Sensory exam: intact to light touch, pinprick, vibration, temperature sense in the upper and lower extremities.  Gait, station and balance: She stands with difficulty. She stands wide-based. She is not comfortable walking tandem. She walks slowly with a cane.   Assessment and Plan:    In summary, Kayla Mayo is a very pleasant 60 y.o.-year old female with an underlying medical history of hypertension, hyperlipidemia, diabetes, status post gastric bypass surgery, obesity, vitamin B12 deficiency, iron deficiency anemia, chronic back pain, reflux disease, anxiety, depression, IBS, and recent emergency room visit on 11/17/2015 for fractured right toe, who was previously diagnosed with obstructive sleep apnea prior to her weight loss surgery. She reports snoring and excessive daytime somnolence, nonrestorative sleep and also reports symptoms of restless leg syndrome.  I had a long chat with the patient and her husband about my findings and the diagnosis of OSA, its prognosis and treatment options. We talked about medical treatments, surgical interventions and non-pharmacological approaches. I explained in particular the risks and ramifications of untreated moderate to severe OSA, especially with respect to developing cardiovascular disease down the Road, including congestive heart failure, difficult to treat hypertension, cardiac arrhythmias, or stroke. Even type 2 diabetes has, in part, been linked to untreated OSA. Symptoms of untreated OSA include daytime sleepiness, memory problems, mood irritability and mood disorder such as depression and anxiety, lack of energy, as well as recurrent headaches, especially  morning headaches. We talked about trying to maintain a  healthy lifestyle in general, as well as the importance of weight control. I encouraged the patient to eat healthy, exercise daily and keep well hydrated, to keep a scheduled bedtime and wake time routine, to not skip any meals and eat healthy snacks in between meals. I advised the patient not to drive when feeling sleepy. I recommended the following at this time: sleep study with potential positive airway pressure titration. (We will score hypopneas at 4% and split the sleep study into diagnostic and treatment portion, if the estimated. 2 hour AHI is >15/h).   I explained the sleep test procedure to the patient and also outlined possible surgical and non-surgical treatment options of OSA, including the use of a custom-made dental device (which would require a referral to a specialist dentist or oral surgeon), upper airway surgical options, such as pillar implants, radiofrequency surgery, tongue base surgery, and UPPP (which would involve a referral to an ENT surgeon). Rarely, jaw surgery such as mandibular advancement may be considered.  I also explained the CPAP treatment option to the patient, who indicated that she would be willing to try CPAP if the need arises. I explained the importance of being compliant with PAP treatment, not only for insurance purposes but primarily to improve Her symptoms, and for the patient's long term health benefit, including to reduce Her cardiovascular risks. I answered all their questions today and the patient and her husband were in agreement. I would like to see her back after the sleep study is completed and encouraged her to call with any interim questions, concerns, problems or updates.   Thank you very much for allowing me to participate in the care of this nice patient. If I can be of any further assistance to you please do not hesitate to call me at [phone].  Sincerely,   Star Age, MD, PhD

## 2016-01-25 ENCOUNTER — Ambulatory Visit (INDEPENDENT_AMBULATORY_CARE_PROVIDER_SITE_OTHER): Payer: Medicare HMO | Admitting: Licensed Clinical Social Worker

## 2016-01-25 ENCOUNTER — Ambulatory Visit (HOSPITAL_COMMUNITY): Payer: Self-pay | Admitting: Licensed Clinical Social Worker

## 2016-01-25 DIAGNOSIS — F411 Generalized anxiety disorder: Secondary | ICD-10-CM | POA: Diagnosis not present

## 2016-01-25 DIAGNOSIS — F330 Major depressive disorder, recurrent, mild: Secondary | ICD-10-CM

## 2016-01-25 NOTE — Progress Notes (Signed)
   THERAPIST PROGRESS NOTE  Session Time: 11:10am-12:00pm  Participation Level: Active  Behavioral Response: CasualAlertEuthymic  Type of Therapy: Individual Therapy  Treatment Goals addressed:  improve enjoyment and engagement in activities  Interventions: Mindfulness  Suicidal/Homicidal: Denied both  Therapist Interventions:   Introduced the concept of mindfulness.  Emphasized how learning to focus on the present can help you to feel more in control of your emotions.  Explained how it can be useful to practice at times when you catch yourself having unhelpful thoughts.  Described how you can choose to do tasks in a mindful way.  Introduced a mindful breathing exercise called the 4-7-8 Breath.    Summary:  Continues to report satisfaction with her mood and enjoyment of activities.  Noted that her husband has told her she seems calmer.  Enjoyed going out to eat with her husband.   Indicated she is receptive to applying mindfulness.  She said, "I think it is quite helpful.  It caused me to think about some things I never thought about before."   Noted that there are times when she decides to focus on slowing down her breathing.  Interested in using the specific breathing technique taught today.   Admitted that she "always feels like she has to fix things" and applying principles of mindfulness may help her to accept problems that are out of her control.   Identified several ways she is already practicing mindfulness: when she focuses on coloring, when she watches documentaries about animals, and when she meditates on bible scriptures.     Plan: Planning to schedule next session in approximately 4 weeks.  Diagnosis: Major Depressive Disorder, recurrent, mild                         GAD    Kayla Mayo 01/25/2016

## 2016-02-01 ENCOUNTER — Ambulatory Visit: Payer: Self-pay

## 2016-02-04 ENCOUNTER — Other Ambulatory Visit: Payer: Self-pay | Admitting: Family Medicine

## 2016-02-04 DIAGNOSIS — Z1231 Encounter for screening mammogram for malignant neoplasm of breast: Secondary | ICD-10-CM

## 2016-02-09 ENCOUNTER — Ambulatory Visit (HOSPITAL_BASED_OUTPATIENT_CLINIC_OR_DEPARTMENT_OTHER): Payer: Medicare HMO

## 2016-02-09 VITALS — BP 130/75 | HR 80 | Temp 99.0°F | Resp 18

## 2016-02-09 DIAGNOSIS — D510 Vitamin B12 deficiency anemia due to intrinsic factor deficiency: Secondary | ICD-10-CM | POA: Diagnosis not present

## 2016-02-09 MED ORDER — CYANOCOBALAMIN 1000 MCG/ML IJ SOLN
1000.0000 ug | Freq: Once | INTRAMUSCULAR | Status: AC
  Administered 2016-02-09: 1000 ug via INTRAMUSCULAR

## 2016-02-09 NOTE — Patient Instructions (Signed)

## 2016-02-12 ENCOUNTER — Ambulatory Visit
Admission: RE | Admit: 2016-02-12 | Discharge: 2016-02-12 | Disposition: A | Discharge status: Home / Self Care | Payer: Medicare HMO | Source: Ambulatory Visit | Attending: Family Medicine | Admitting: Family Medicine

## 2016-02-12 DIAGNOSIS — Z1231 Encounter for screening mammogram for malignant neoplasm of breast: Secondary | ICD-10-CM

## 2016-02-19 ENCOUNTER — Ambulatory Visit (INDEPENDENT_AMBULATORY_CARE_PROVIDER_SITE_OTHER): Payer: Medicare HMO | Admitting: Neurology

## 2016-02-19 DIAGNOSIS — G4733 Obstructive sleep apnea (adult) (pediatric): Secondary | ICD-10-CM

## 2016-02-19 DIAGNOSIS — G4720 Circadian rhythm sleep disorder, unspecified type: Secondary | ICD-10-CM

## 2016-02-19 DIAGNOSIS — G4761 Periodic limb movement disorder: Secondary | ICD-10-CM

## 2016-02-22 ENCOUNTER — Ambulatory Visit (INDEPENDENT_AMBULATORY_CARE_PROVIDER_SITE_OTHER): Payer: Medicare HMO | Admitting: Licensed Clinical Social Worker

## 2016-02-22 DIAGNOSIS — F331 Major depressive disorder, recurrent, moderate: Secondary | ICD-10-CM | POA: Diagnosis not present

## 2016-02-22 DIAGNOSIS — F411 Generalized anxiety disorder: Secondary | ICD-10-CM | POA: Diagnosis not present

## 2016-02-22 NOTE — Progress Notes (Signed)
   THERAPIST PROGRESS NOTE  Session Time: [id number]  Participation Level: Active  Behavioral Response: CasualAlert Anxious  Type of Therapy: Individual Therapy  Treatment Goals addressed:  improve enjoyment and engagement in activities  Interventions:   Suicidal/Homicidal: Denied both  Therapist Interventions:   Discussed how some events in the past month have affected patient.   Asked patient to elaborate on her history of problems with her back.  Checked to see if patient has been practicing mindfulness as discussed at her last therapy session.      Summary:  Reported that she has noticed an increase in anxiety and withdrawing from others within the past month.  Talked about feeling overwhelmed by current life stressors.  She had been looking forward to getting a procedure to kill the nerves in her back, but it has been postponed.  Meanwhile her pain level is up and she is having to take pain pills.  Also having to cope with news of two deaths within her family.  Worried about her ex-mother-in-law who is having one of her legs amputated today due to excessive blood clots.  They still have a good relationship.        Patient explained that her back is in such poor shape because of all the wear and tear she put on her body over the years.  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  Living with her first husband she had to chop firewood because he wouldn't pay for heat and refused to help her.  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   Acknowledged that she has not been practicing mindfulness regularly.  Has done the breathing exercise some.  Unsure how effective it has been.       Plan: Planning to schedule next session in a month  Diagnosis: Major Depressive Disorder, recurrent, moderate                         GAD    Garnette Scheuermann, LCSW 02/22/2016

## 2016-02-26 ENCOUNTER — Telehealth: Payer: Self-pay | Admitting: Neurology

## 2016-02-26 NOTE — Progress Notes (Signed)
PATIENT'S NAME:  Kayla Mayo, Kayla Mayo DOB:      [birth year]      MR#:    [id number]     DATE OF RECORDING: 02/19/2016 REFERRING M.D.:  [name] MD Study Performed:   Baseline Polysomnogram HISTORY: 60 year old right-handed woman with an underlying medical history of hypertension, hyperlipidemia, diabetes, status post gastric bypass surgery, obesity, vitamin B12 deficiency, iron deficiency anemia, chronic back pain, reflux disease, anxiety, depression, IBS, and recent emergency room visit on 11/17/2015 for fractured right toe, who reports snoring and excessive daytime somnolence.  The patient endorsed the Epworth Sleepiness Scale at 2 points. The patient's weight 222 pounds with a height of 66 (inches), resulting in a BMI of 35.8 kg/m2. The patient's neck circumference measured 14.25 inches.  CURRENT MEDICATIONS: Aspirin, Temovate, Flexeril, Voltaren, Lexapro, Flonase, Norco, Synthroid, Antivert, Metformin, Naproxen, Protonix, Crestor, Desyrel, Diovan   PROCEDURE:  This is a multichannel digital polysomnogram utilizing the Somnostar 11.2 system.  Electrodes and sensors were applied and monitored per AASM Specifications.   EEG, EOG, Chin and Limb EMG, were sampled at 200 Hz.  ECG, Snore and Nasal Pressure, Thermal Airflow, Respiratory Effort, CPAP Flow and Pressure, Oximetry was sampled at 50 Hz. Digital video and audio were recorded.      BASELINE STUDY  Lights Out was at 22:14 and Lights On at 05:17.  Total recording time (TRT) was 423.5 minutes, with a total sleep time (TST) of  362.5 minutes.   The patient's sleep latency was 31.5 minutes, which is mildly prolonged.  REM latency was 132.5 minutes, which is mildly prolonged.  The sleep efficiency was 85.6 %.     SLEEP ARCHITECTURE: WASO (Wake after sleep onset) was 39 minutes with mild sleep fragmentation noted.  There were 20 minutes in Stage N1, 34 minutes Stage N2, 248.5 minutes Stage N3 and 60 minutes in Stage REM.  The percentage of Stage N1 was  5.5%, Stage N2 was 9.4%, Stage N3 was 68.6%, which is increased and Stage R (REM sleep) was 16.6%, which is mildly reduced.   RESPIRATORY ANALYSIS:  There were a total of 7 respiratory events:  0 obstructive apneas, 1 central apneas and 0 mixed apneas with a total of 1 apneas and an apnea index (AI) of .2 /hour. There were 6 hypopneas with a hypopnea index of 1. /hour. The patient also had 0 respiratory event related arousals (RERAs).      The total APNEA/HYPOPNEA INDEX (AHI) was 1.2/hour and the total RESPIRATORY DISTURBANCE INDEX was 1.2 /hour.  3 events occurred in REM sleep and 8 events in NREM. The REM AHI was 3 /hour, versus a non-REM AHI of .8. The patient spent 5 minutes of total sleep time in the supine position and 358 minutes in non-supine.. The supine AHI was 36.0 versus a non-supine AHI of 0.7.  OXYGEN SATURATION & C02:  The Wake baseline 02 saturation was 98%, with the lowest being 86%. Time spent below 89% saturation equaled 0 minutes.   PERIODIC LIMB MOVEMENTS:   The patient had a total of 165 Periodic Limb Movements.  The Periodic Limb Movement (PLM) index was 27.3, which is moderately increased, and the PLM Arousal index was 1.5 /hour, which is not increased.   IMPRESSION:   1.  Periodic Limb Movement Disorder  2.  Dysfunctions associated with sleep stages or arousal from sleep  RECOMMENDATIONS:  1. This study does not demonstrate any significant obstructive or central sleep disordered breathing, other than evidence of supine OSA. For this, treatment  with positive airway pressure is not warranted. Weight loss and avoidance of the supine sleep position is recommended.  2. Moderate PLMs (periodic limb movements of sleep) were noted during the study without significant arousals; clinical correlation is recommended. Medication effect from the patient's antidepressant medication should be considered.  3. The patient should be cautioned not to drive, work at heights, or operate  dangerous or heavy equipment when tired or sleepy. Review and reiteration of good sleep hygiene measures should be pursued with any patient. 4. This study shows sleep fragmentation and abnormal sleep stage percentages; these are nonspecific findings and per se do not signify an intrinsic sleep disorder or a cause for the patient's sleep-related symptoms. Causes include (but are not limited to) the first night effect of the sleep study, circadian rhythm disturbances, medication effect or an underlying mood disorder or medical problem.  5. The patient will be seen in follow-up by Dr. Rexene Alberts at [facility] for discussion of the test results and further management strategies. The referring provider will be notified of the test results.   I certify that I have reviewed the entire raw data recording prior to the issuance of this report in accordance with the Standards of Accreditation of the [place] [employer] (AASM)       Star Age, MD,PhD Diplomat, American Board of Psychiatry and Neurology  Diplomat, [place] [employer]

## 2016-02-26 NOTE — Telephone Encounter (Signed)
Patient referred by Dr. Glennon Mac, seen by me on 01/21/16, diagnostic PSG on 02/19/16.   Please call and notify the patient that the recent sleep study did not show any significant obstructive sleep apnea, only evidence of OSA during supine sleep. Also PLMs, but no associated arousals to speak of. Please inform patient that I would like to go over the details of the study during a follow up appointment. Arrange a followup appointment. Also, route or fax report to PCP and referring MD, if other than PCP.  Once you have spoken to patient, you can close this encounter.   Thanks,  Star Age, MD, PhD [facility])

## 2016-02-29 ENCOUNTER — Ambulatory Visit: Payer: Self-pay

## 2016-02-29 NOTE — Telephone Encounter (Signed)
I spoke to patient and she is aware of results and recommendations. Patient did not want to make a follow up appt. I advised her that she can call back if she changes mind. She is aware that I will send copy to PCP.

## 2016-03-02 ENCOUNTER — Telehealth: Payer: Self-pay | Admitting: Hematology

## 2016-03-02 NOTE — Telephone Encounter (Signed)
Returned call to patient in regards to rescheduling her 11/7 appointment time.

## 2016-03-07 ENCOUNTER — Ambulatory Visit (INDEPENDENT_AMBULATORY_CARE_PROVIDER_SITE_OTHER): Payer: Medicare HMO | Admitting: Psychiatry

## 2016-03-07 ENCOUNTER — Encounter (HOSPITAL_COMMUNITY): Payer: Self-pay | Admitting: Psychiatry

## 2016-03-07 VITALS — BP 126/70 | HR 74 | Resp 16 | Ht 66.0 in | Wt 225.8 lb

## 2016-03-07 DIAGNOSIS — Z79899 Other long term (current) drug therapy: Secondary | ICD-10-CM

## 2016-03-07 DIAGNOSIS — Z823 Family history of stroke: Secondary | ICD-10-CM

## 2016-03-07 DIAGNOSIS — Z818 Family history of other mental and behavioral disorders: Secondary | ICD-10-CM

## 2016-03-07 DIAGNOSIS — Z811 Family history of alcohol abuse and dependence: Secondary | ICD-10-CM

## 2016-03-07 DIAGNOSIS — Z630 Problems in relationship with spouse or partner: Secondary | ICD-10-CM | POA: Diagnosis not present

## 2016-03-07 DIAGNOSIS — F411 Generalized anxiety disorder: Secondary | ICD-10-CM | POA: Diagnosis not present

## 2016-03-07 DIAGNOSIS — F331 Major depressive disorder, recurrent, moderate: Secondary | ICD-10-CM

## 2016-03-07 DIAGNOSIS — Z9109 Other allergy status, other than to drugs and biological substances: Secondary | ICD-10-CM

## 2016-03-07 DIAGNOSIS — Z91010 Allergy to peanuts: Secondary | ICD-10-CM

## 2016-03-07 DIAGNOSIS — Z833 Family history of diabetes mellitus: Secondary | ICD-10-CM

## 2016-03-07 DIAGNOSIS — F5102 Adjustment insomnia: Secondary | ICD-10-CM

## 2016-03-07 DIAGNOSIS — Z813 Family history of other psychoactive substance abuse and dependence: Secondary | ICD-10-CM

## 2016-03-07 MED ORDER — ESCITALOPRAM OXALATE 10 MG PO TABS: 15.0000 mg | ORAL_TABLET | Freq: Every day | ORAL | 1 refills | Status: DC

## 2016-03-07 NOTE — Progress Notes (Signed)
[facility] Outpatient Follow up visit  Patient Identification: Kayla Mayo MRN:  [id number] Date of Evaluation:  03/07/2016 Referral Source: [name] and herself  Chief Complaint:   Chief Complaint    Follow-up     Visit Diagnosis:    ICD-9-CM ICD-10-CM   1. Major depressive disorder, recurrent episode, moderate (HCC) 296.32 F33.1   2. GAD (generalized anxiety disorder) 300.02 F41.1   3. Adjustment insomnia 307.41 F51.02   4. Relationship problem between partners V61.10 Z63.0     History of Present Illness:  60 years old currently married African-American female initially referred for management of depression and anxiety she suffers from chronic medical illnesses including fibromyalgia, chronic back pain and multiple surgeries history of gastric bypass.   She has seen dr. Rose Fillers before in this clinic 2015. At that time she was taking antidepressants and also trazodone apparently she stopped taking the antidepressant when the doctor left  She is suffering from depression and anxiety crying spells related to her pain and back condition. Her husband was distant/ She did improve on lexapro but now again feeling down and amotivated Sleep study showed PLMD but not sleep apnea. She did not get a follow up with sleep doctor to review . Says she was told she does not have sleep apnea. Pain somewhat better on currently neurontin   Aggravating factor: distant husband. Multiple medical problems ( see list) limitations and pain Modifying factor: her will power. Her kids Severity of depression: 7/10. 10 being no depression   Associated Signs/Symptoms: Depression Symptoms: amotivation (Hypo) Manic Symptoms:  Distractibility, Anxiety Symptoms:  Excessive Worry, baseline Psychotic Symptoms:  denies PTSD Symptoms: NA   Substance Abuse History in the last 12 months:  No.  Consequences of Substance Abuse: NA  Past Medical History:  Past Medical History:  Diagnosis Date  . Anal  fissure   . Anemia 03/17/2011  . Anxiety   . Arthritis   . Breast cancer ([place])   . Bursitis of hip   . Chronic headaches   . Colon polyps   . Depression   . Diabetes mellitus   . Esophageal spasm   . Gallstones   . GERD (gastroesophageal reflux disease)   . Hyperlipidemia   . Hypertension   . IBS (irritable bowel syndrome)   . Neuropathy ([place])   . Obesity   . Osteopenia 07/2011   t score -1.5 FRAX 2.1%/0%  . Status post cardiac catheterization   . Vertigo    Occurs 6 times a month    Past Surgical History:  Procedure Laterality Date  . APPENDECTOMY    . bilareral knee scopes    . CARPAL TUNNEL RELEASE    . CHOLECYSTECTOMY    . GASTRIC BYPASS    . left shouler surgery    . lower back surgery    . TUBAL LIGATION      Family Psychiatric History: not aware or denies  Family History:  Family History  Problem Relation Age of Onset  . Diabetes Mother   . Stroke Mother   . Heart disease Mother   . Hypertension Mother   . Hyperlipidemia Mother   . Anxiety disorder Mother   . Diabetes Brother   . Alcohol abuse Brother   . Drug abuse Brother   . Alcohol abuse Father   . Cancer Father     Stomach  . Diabetes Maternal Aunt   . Diabetes Maternal Uncle   . Alcohol abuse Maternal Uncle   . Diabetes Maternal Grandmother   .  Dementia Maternal Grandmother   . Depression Maternal Grandmother   . Alcohol abuse Paternal Uncle   . Cancer Paternal Uncle     Throat  . Alcohol abuse Maternal Grandfather   . Alcohol abuse Paternal Grandfather   . Alcohol abuse Maternal Uncle   . Alcohol abuse Paternal Uncle   . Alcohol abuse Paternal Uncle   . Cancer Paternal Aunt     Leukemia    Social History:   Social History   Social History  . Marital status: Married    Spouse name: N/A  . Number of children: 2  . Years of education: 14   Occupational History  . Disabled Unemployed   Social History Main Topics  . Smoking status: Never Smoker  . Smokeless tobacco: Never  Used  . Alcohol use No  . Drug use: No  . Sexual activity: Not Currently    Partners: Male    Birth control/ protection: Post-menopausal, Surgical     Comment: Tubal lig   Other Topics Concern  . None   Social History Narrative   Denies caffeine use      Allergies:   Allergies  Allergen Reactions  . Chicken Allergy     Sinus drainage  . Morphine And Related Itching  . Codeine   . Corn-Containing Products   . Other     Statins  . Peanut-Containing Drug Products   . Sesame Oil     Sinus drainage  . Simvastatin     Muscle pain  . Wheat     Sinus drainage    Metabolic Disorder Labs: Lab Results  Component Value Date   HGBA1C 6.6 (H) 03/23/2011   MPG 134 11/27/2008   No results found for: PROLACTIN Lab Results  Component Value Date   CHOL 183 03/23/2011   TRIG 69.0 03/23/2011   HDL 69.60 03/23/2011   CHOLHDL 3 03/23/2011   VLDL 13.8 03/23/2011   LDLCALC 100 (H) 03/23/2011   LDLCALC 113 (H) 08/24/2010     Current Medications: Current Outpatient Prescriptions  Medication Sig Dispense Refill  . ACCU-CHEK FASTCLIX LANCETS MISC Use daily as directed for Dx: 250.00    . aspirin 81 MG tablet Take 81 mg by mouth daily.    . clobetasol ointment (TEMOVATE) 0.05 % 2 (two) times daily. On scalp bid  2  . cyanocobalamin (,VITAMIN B-12,) 1000 MCG/ML injection Inject 1,000 mcg into the muscle every 30 (thirty) days.      . cyclobenzaprine (FLEXERIL) 10 MG tablet Take 10 mg by mouth 3 (three) times daily as needed.    . diclofenac sodium (VOLTAREN) 1 % GEL APPLY 2g 4 times a day applied topically 30 day(s)    . escitalopram (LEXAPRO) 10 MG tablet Take 1.5 tablets (15 mg total) by mouth daily. one a day. 45 tablet 1  . fluticasone (FLONASE) 50 MCG/ACT nasal spray Place 2 sprays into both nostrils 2 (two) times daily.    . furosemide (LASIX) 20 MG tablet Take 1 tablet (20 mg total) by mouth 2 (two) times daily. 180 tablet 3  . gabapentin (NEURONTIN) 300 MG capsule Take 300  mg by mouth 2 (two) times daily.    Marland Kitchen HYDROcodone-acetaminophen (NORCO) 7.5-325 MG tablet Take 1 tablet by mouth 3 (three) times daily as needed for moderate pain.    . hydrocortisone (PROCTOSOL HC) 2.5 % rectal cream Place 1 application rectally as needed. 30 g 0  . levothyroxine (SYNTHROID, LEVOTHROID) 25 MCG tablet Take 25 mcg by mouth.    Marland Kitchen  Linaclotide (LINZESS) 290 MCG CAPS capsule Take 1 capsule by mouth daily.    . meclizine (ANTIVERT) 25 MG tablet Take 25 mg by mouth 3 (three) times daily as needed.      . metFORMIN (GLUCOPHAGE) 850 MG tablet Take 850 mg by mouth 2 (two) times daily with a meal.    . naproxen (NAPROSYN) 500 MG tablet Take 1 tablet (500 mg total) by mouth 2 (two) times daily with a meal. 20 tablet 0  . pantoprazole (PROTONIX) 20 MG tablet Take 40 mg by mouth daily.     . polyethylene glycol powder (GLYCOLAX/MIRALAX) powder Take 17 g by mouth 2 (two) times daily. 255 g 0  . rosuvastatin (CRESTOR) 10 MG tablet Take 10 mg by mouth daily.     . traZODone (DESYREL) 100 MG tablet 1& 1/2 TABLETS BY MOUTH AT BEDTIME 135 tablet 1  . valsartan (DIOVAN) 80 MG tablet   5  . verapamil (CALAN) 80 MG tablet Take 1 tablet (80 mg total) by mouth daily. 90 tablet 3  . EPIPEN 2-PAK 0.3 MG/0.3ML DEVI as needed. Reported on 10/12/2015     No current facility-administered medications for this visit.    Facility-Administered Medications Ordered in Other Visits  Medication Dose Route Frequency Provider Last Rate Last Dose  . cyanocobalamin ((VITAMIN B-12)) injection 1,000 mcg  1,000 mcg Intramuscular Q28 days Marcy Panning, MD        Neurologic: Headache: No Seizure: No Paresthesias:No  Musculoskeletal: Strength & Muscle Tone: normal Gait & Station: unsteady Patient leans: Front  Psychiatric Specialty Exam: Review of Systems  Constitutional: Negative for fever.  Cardiovascular: Negative for chest pain.  Gastrointestinal: Negative for nausea.  Musculoskeletal: Positive for back  pain and myalgias.  Skin: Negative for rash.  Psychiatric/Behavioral: Positive for depression. Negative for substance abuse and suicidal ideas.    Blood pressure 126/70, pulse 74, resp. rate 16, height 5\' 6"  (1.676 m), weight 225 lb 12.8 oz (102.4 kg), last menstrual period 05/02/2006, SpO2 97 %.Body mass index is 36.45 kg/m.  General Appearance: Casual  Eye Contact:  Fair  Speech:  Slow  Volume:  Decreased  Mood:  Somewhat dysthymic  Affect:  congruent  Thought Process:  Goal Directed  Orientation:  Full (Time, Place, and Person)  Thought Content:  Rumination  Suicidal Thoughts:  No  Homicidal Thoughts:  No  Memory:  Immediate;   Poor Recent;   Fair  Judgement:  Fair  Insight:  Shallow  Psychomotor Activity:  Decreased  Concentration:  Concentration: Fair and Attention Span: Fair  Recall:  [employer] of Knowledge:Fair  Language: Fair  Akathisia:  No  Handed:  Right  AIMS (if indicated):    Assets:  Desire for Improvement Resilience  ADL's:  Intact  Cognition: WNL  Sleep:  fair    Treatment Plan Summary: Medication management and Plan as follows  Mood disorder NOS: may be related with multiple medical concerns, pain , psychosocial issues.  Depression major, recurrent: improved on lexapro. Will refill and print GAD: lexapro increase to 15mg .  InsomniaL: reviewed sleep hygiene. Should follow up with sleep specialist to review study and options. Continue trazadone by primary care neutonitn has helped as wells some  Pain condition: has appointment with pain management.   Continue therapy More then  50% of time spent in counseling and coordination which included patient education and review of side effects and complications concerns Patient call 911 or report to the emergency room for any urgent concerns or suicidal  thoughts  Follow up 2 months/ Time spent: 25 minutes    De Nurse, Seanpatrick Maisano, MD 11/6/201712:45 PM

## 2016-03-08 ENCOUNTER — Ambulatory Visit: Payer: Self-pay

## 2016-03-08 ENCOUNTER — Ambulatory Visit (HOSPITAL_BASED_OUTPATIENT_CLINIC_OR_DEPARTMENT_OTHER): Payer: Medicare HMO

## 2016-03-08 DIAGNOSIS — D508 Other iron deficiency anemias: Secondary | ICD-10-CM

## 2016-03-08 DIAGNOSIS — D519 Vitamin B12 deficiency anemia, unspecified: Secondary | ICD-10-CM

## 2016-03-08 DIAGNOSIS — D510 Vitamin B12 deficiency anemia due to intrinsic factor deficiency: Secondary | ICD-10-CM | POA: Diagnosis not present

## 2016-03-08 MED ORDER — CYANOCOBALAMIN 1000 MCG/ML IJ SOLN
1000.0000 ug | Freq: Once | INTRAMUSCULAR | Status: AC
  Administered 2016-03-08: 1000 ug via INTRAMUSCULAR

## 2016-03-08 NOTE — Patient Instructions (Signed)

## 2016-03-28 ENCOUNTER — Other Ambulatory Visit: Payer: Self-pay

## 2016-03-28 ENCOUNTER — Ambulatory Visit: Payer: Self-pay

## 2016-03-28 ENCOUNTER — Ambulatory Visit: Payer: Self-pay | Admitting: Hematology

## 2016-04-01 ENCOUNTER — Ambulatory Visit (INDEPENDENT_AMBULATORY_CARE_PROVIDER_SITE_OTHER): Payer: Medicare HMO | Admitting: Licensed Clinical Social Worker

## 2016-04-01 DIAGNOSIS — F3341 Major depressive disorder, recurrent, in partial remission: Secondary | ICD-10-CM

## 2016-04-01 NOTE — Progress Notes (Signed)
   THERAPIST PROGRESS NOTE  Session Time: 11:15am-11:50am  Participation Level: Active  Behavioral Response: CasualAlert Euthymic  Type of Therapy: Individual Therapy  Treatment Goals addressed:  improve enjoyment and engagement in activities  Interventions: Review of treatment progress  Suicidal/Homicidal: Denied both  Therapist Interventions:   Talked about different activities patient has enjoyed within the past month.   Identified some things that patient is looking forward to doing.  Had patient complete a PHQ-9 and GAD-7 to assess for severity of depression and anxiety symptoms. Determined that patient achieved her goal to increase satisfaction with how she is spending her time and increase engagement in social activities.  Collaboratively decided to terminate therapy at this time as patient did not identify any further concerns she wanted to address in therapy.     Summary:  Reported enjoying her surprise anniversary party with friends and family.  Has also been enjoying going out to different stores, out to eat, and visiting with friends.   She is looking forward to a visit from her sister and baby niece and also a trip out of town.  Patient has made tremendous progress with reducing her symptoms:   Depression screen [facility] 2/9 04/01/2016 12/28/2015  Decreased Interest 2 2  Down, Depressed, Hopeless 0 2  PHQ - 2 Score 2 4  Altered sleeping 1 3  Tired, decreased energy 0 3  Change in appetite 0 0  Feeling bad or failure about yourself  0 0  Trouble concentrating 0 3  Moving slowly or fidgety/restless 0 3  Suicidal thoughts 0 0  PHQ-9 Score 3 16  Difficult doing work/chores Not difficult at all Very difficult   GAD 7 : Generalized Anxiety Score 04/01/2016 12/28/2015  Nervous, Anxious, on Edge 0 3  Control/stop worrying 0 3  Worry too much - different things 0 3  Trouble relaxing 0 3  Restless 0 3  Easily annoyed or irritable 0 3  Afraid - awful might happen 0 3   Total GAD 7 Score 0 21  Anxiety Difficulty Not difficult at all Very difficult         Plan: No further sessions scheduled however therapist encouraged patient to schedule an appointment if concerns developed in the future.     Diagnosis: Major Depressive Disorder, recurrent, in partial remission                            Kayla Mayo 04/01/2016

## 2016-04-05 ENCOUNTER — Other Ambulatory Visit (HOSPITAL_BASED_OUTPATIENT_CLINIC_OR_DEPARTMENT_OTHER): Payer: Medicare HMO

## 2016-04-05 ENCOUNTER — Ambulatory Visit (HOSPITAL_BASED_OUTPATIENT_CLINIC_OR_DEPARTMENT_OTHER): Payer: Medicare HMO

## 2016-04-05 ENCOUNTER — Ambulatory Visit (HOSPITAL_BASED_OUTPATIENT_CLINIC_OR_DEPARTMENT_OTHER): Payer: Medicare HMO | Admitting: Hematology

## 2016-04-05 ENCOUNTER — Encounter: Payer: Self-pay | Admitting: Hematology

## 2016-04-05 ENCOUNTER — Telehealth: Payer: Self-pay | Admitting: Hematology

## 2016-04-05 VITALS — BP 133/73 | HR 66 | Temp 99.0°F | Resp 18 | Ht 66.0 in | Wt 229.5 lb

## 2016-04-05 DIAGNOSIS — M1990 Unspecified osteoarthritis, unspecified site: Secondary | ICD-10-CM

## 2016-04-05 DIAGNOSIS — D6489 Other specified anemias: Secondary | ICD-10-CM

## 2016-04-05 DIAGNOSIS — D509 Iron deficiency anemia, unspecified: Secondary | ICD-10-CM

## 2016-04-05 DIAGNOSIS — E119 Type 2 diabetes mellitus without complications: Secondary | ICD-10-CM

## 2016-04-05 DIAGNOSIS — R5383 Other fatigue: Secondary | ICD-10-CM

## 2016-04-05 DIAGNOSIS — D518 Other vitamin B12 deficiency anemias: Secondary | ICD-10-CM

## 2016-04-05 DIAGNOSIS — G4700 Insomnia, unspecified: Secondary | ICD-10-CM

## 2016-04-05 DIAGNOSIS — D510 Vitamin B12 deficiency anemia due to intrinsic factor deficiency: Secondary | ICD-10-CM

## 2016-04-05 LAB — COMPREHENSIVE METABOLIC PANEL
ALT: 19 U/L (ref 0–55)
ANION GAP: 7 meq/L (ref 3–11)
AST: 22 U/L (ref 5–34)
Albumin: 3.6 g/dL (ref 3.5–5.0)
Alkaline Phosphatase: 83 U/L (ref 40–150)
BILIRUBIN TOTAL: 0.53 mg/dL (ref 0.20–1.20)
BUN: 12.5 mg/dL (ref 7.0–26.0)
CHLORIDE: 105 meq/L (ref 98–109)
CO2: 29 meq/L (ref 22–29)
CREATININE: 0.9 mg/dL (ref 0.6–1.1)
Calcium: 9.1 mg/dL (ref 8.4–10.4)
EGFR: 85 mL/min/{1.73_m2} — ABNORMAL LOW (ref >90)
GLUCOSE: 133 mg/dL (ref 70–140)
Potassium: 5 mEq/L (ref 3.5–5.1)
SODIUM: 141 meq/L (ref 136–145)
TOTAL PROTEIN: 6.6 g/dL (ref 6.4–8.3)

## 2016-04-05 LAB — IRON AND TIBC
%SAT: 36 % (ref 21–57)
Iron: 92 ug/dL (ref 41–142)
TIBC: 254 ug/dL (ref 236–444)
UIBC: 162 ug/dL (ref 120–384)

## 2016-04-05 LAB — CBC & DIFF AND RETIC
BASO%: 0.9 % (ref 0.0–2.0)
Basophils Absolute: 0 10*3/uL (ref 0.0–0.1)
EOS%: 3.9 % (ref 0.0–7.0)
Eosinophils Absolute: 0.2 10*3/uL (ref 0.0–0.5)
HCT: 35.2 % (ref 34.8–46.6)
HGB: 11.3 g/dL — ABNORMAL LOW (ref 11.6–15.9)
IMMATURE RETIC FRACT: 6.4 % (ref 1.60–10.00)
LYMPH#: 1.5 10*3/uL (ref 0.9–3.3)
LYMPH%: 34.1 % (ref 14.0–49.7)
MCH: 30.4 pg (ref 25.1–34.0)
MCHC: 32.1 g/dL (ref 31.5–36.0)
MCV: 94.6 fL (ref 79.5–101.0)
MONO#: 0.2 10*3/uL (ref 0.1–0.9)
MONO%: 4.1 % (ref 0.0–14.0)
NEUT#: 2.5 10*3/uL (ref 1.5–6.5)
NEUT%: 57 % (ref 38.4–76.8)
PLATELETS: 195 10*3/uL (ref 145–400)
RBC: 3.72 10*6/uL (ref 3.70–5.45)
RDW: 13.1 % (ref 11.2–14.5)
RETIC %: 2 % (ref 0.70–2.10)
RETIC CT ABS: 74.4 10*3/uL (ref 33.70–90.70)
WBC: 4.3 10*3/uL (ref 3.9–10.3)

## 2016-04-05 LAB — FERRITIN: Ferritin: 163 ng/ml (ref 9–269)

## 2016-04-05 MED ORDER — CYANOCOBALAMIN 1000 MCG/ML IJ SOLN
1000.0000 ug | Freq: Once | INTRAMUSCULAR | Status: AC
  Administered 2016-04-05: 1000 ug via INTRAMUSCULAR

## 2016-04-05 NOTE — Patient Instructions (Signed)

## 2016-04-05 NOTE — Progress Notes (Signed)
[facility] PROGRESS NOTE DATE OF SERVICE: 04/05/2016    Kayla Messick, MD [address] [place] [address]  DIAGNOSIS: 60 year old female with iron deficiency and B12 deficiency associated anemia (s/p gastric bypass surgery)  PRIOR THERAPY:  #1 Patient has received B12 injections on a monthly basis.   #2 She had iron infusions in past for a low Ferritin, last Feraheme infusion was 04/22/2015  CURRENT THERAPY: B12 injections every month and iron infuion prn  INTERVAL HISTORY:  Kayla Mayo 60 y.o. female returns for followup visit today. She has chronic arthralgias from osteoarthritis, being managed by pain clinic. She complains of worsening back and knee pain lately, she'll follow-up with her pain specialist. She has mild constipation from opiates, takes Senokot and MiraLAX as needed. She also has peripheral neuropathy from her diabetes, she uses a walker when her knee pain is better. She has been compliant with B12 injections, denies any episodes of bleeding. Her energy level is unchanged lately.  MEDICAL HISTORY: Past Medical History:  Diagnosis Date  . Anal fissure   . Anemia 03/17/2011  . Anxiety   . Arthritis   . Breast cancer ([place])   . Bursitis of hip   . Chronic headaches   . Colon polyps   . Depression   . Diabetes mellitus   . Esophageal spasm   . Gallstones   . GERD (gastroesophageal reflux disease)   . Hyperlipidemia   . Hypertension   . IBS (irritable bowel syndrome)   . Neuropathy ([place])   . Obesity   . Osteopenia 07/2011   t score -1.5 FRAX 2.1%/0%  . Status post cardiac catheterization   . Vertigo    Occurs 6 times a month    ALLERGIES:  is allergic to chicken allergy; morphine and related; codeine; corn-containing products; other; peanut-containing drug products; sesame oil; simvastatin; wheat; and lisinopril.  MEDICATIONS:  Current Outpatient Prescriptions  Medication Sig Dispense Refill  . ACCU-CHEK  FASTCLIX LANCETS MISC Use daily as directed for Dx: 250.00    . aspirin 81 MG tablet Take 81 mg by mouth daily.    . clobetasol ointment (TEMOVATE) 0.05 % 2 (two) times daily. On scalp bid  2  . cyanocobalamin (,VITAMIN B-12,) 1000 MCG/ML injection Inject 1,000 mcg into the muscle every 30 (thirty) days.      . cyclobenzaprine (FLEXERIL) 10 MG tablet Take 10 mg by mouth 3 (three) times daily as needed.    . diclofenac sodium (VOLTAREN) 1 % GEL APPLY 2g 4 times a day applied topically 30 day(s)    . EPIPEN 2-PAK 0.3 MG/0.3ML DEVI as needed. Reported on 10/12/2015    . escitalopram (LEXAPRO) 10 MG tablet Take 1.5 tablets (15 mg total) by mouth daily. one a day. 45 tablet 1  . fluticasone (FLONASE) 50 MCG/ACT nasal spray Place 2 sprays into both nostrils 2 (two) times daily.    . furosemide (LASIX) 20 MG tablet Take 1 tablet (20 mg total) by mouth 2 (two) times daily. 180 tablet 3  . gabapentin (NEURONTIN) 300 MG capsule Take 600 mg by mouth 2 (two) times daily.     Marland Kitchen HYDROcodone-acetaminophen (NORCO) 7.5-325 MG tablet Take 1 tablet by mouth 3 (three) times daily as needed for moderate pain.    . hydrocortisone (PROCTOSOL HC) 2.5 % rectal cream Place 1 application rectally as needed. 30 g 0  . levothyroxine (SYNTHROID, LEVOTHROID) 25 MCG tablet Take 25 mcg by mouth.    Marland Kitchen Linaclotide (LINZESS) 290  MCG CAPS capsule Take 1 capsule by mouth daily.    . meclizine (ANTIVERT) 25 MG tablet Take 25 mg by mouth 3 (three) times daily as needed.      . metFORMIN (GLUCOPHAGE) 850 MG tablet Take 850 mg by mouth 2 (two) times daily with a meal.    . naproxen (NAPROSYN) 500 MG tablet Take 1 tablet (500 mg total) by mouth 2 (two) times daily with a meal. 20 tablet 0  . pantoprazole (PROTONIX) 20 MG tablet Take 40 mg by mouth daily.     . polyethylene glycol powder (GLYCOLAX/MIRALAX) powder Take 17 g by mouth 2 (two) times daily. 255 g 0  . rosuvastatin (CRESTOR) 10 MG tablet Take 10 mg by mouth daily.     .  traZODone (DESYREL) 100 MG tablet 1& 1/2 TABLETS BY MOUTH AT BEDTIME 135 tablet 1  . valsartan (DIOVAN) 80 MG tablet   5  . verapamil (CALAN) 80 MG tablet Take 1 tablet (80 mg total) by mouth daily. 90 tablet 3   No current facility-administered medications for this visit.    Facility-Administered Medications Ordered in Other Visits  Medication Dose Route Frequency Provider Last Rate Last Dose  . cyanocobalamin ((VITAMIN B-12)) injection 1,000 mcg  1,000 mcg Intramuscular Q28 days Marcy Panning, MD        SURGICAL HISTORY:  Past Surgical History:  Procedure Laterality Date  . APPENDECTOMY    . bilareral knee scopes    . CARPAL TUNNEL RELEASE    . CHOLECYSTECTOMY    . GASTRIC BYPASS    . left shouler surgery    . lower back surgery    . TUBAL LIGATION      REVIEW OF SYSTEMS:  Pertinent items are noted in HPI.   PHYSICAL EXAMINATION: BP 133/73 (BP Location: Left Arm, Patient Position: Sitting)   Pulse 66   Temp 99 F (37.2 C) (Oral)   Resp 18   Ht 5\' 6"  (1.676 m)   Wt 229 lb 8 oz (104.1 kg)   LMP 05/02/2006   SpO2 100%   BMI 37.04 kg/m   General appearance: alert, cooperative and appears stated age Resp: clear to auscultation bilaterally and normal percussion bilaterally Back: symmetric, no curvature. ROM normal. No CVA tenderness. Cardio: regular rate and rhythm, S1, S2 normal, no murmur, click, rub or gallop GI: soft, non-tender; bowel sounds normal; no masses,  no organomegaly Extremities: extremities normal, atraumatic, no cyanosis or edema Neurologic: Alert and oriented X 3, normal strength and tone. Normal symmetric reflexes. Normal coordination and gait  ECOG PERFORMANCE STATUS: 1 - Symptomatic but completely ambulatory  Blood pressure 133/73, pulse 66, temperature 99 F (37.2 C), temperature source Oral, resp. rate 18, height 5\' 6"  (1.676 m), weight 229 lb 8 oz (104.1 kg), last menstrual period 05/02/2006, SpO2 100 %.  LABORATORY DATA: CBC Latest Ref Rng &  Units 04/05/2016 01/12/2016 10/12/2015  WBC 3.9 - 10.3 10e3/uL 4.3 4.2 5.1  Hemoglobin 11.6 - 15.9 g/dL 11.3(L) 11.9 12.3  Hematocrit 34.8 - 46.6 % 35.2 36.7 37.4  Platelets 145 - 400 10e3/uL 195 235 194    CMP Latest Ref Rng & Units 04/05/2016 01/12/2016 10/12/2015  Glucose 70 - 140 mg/dl 133 165(H) 112  BUN 7.0 - 26.0 mg/dL 12.5 11.5 8.6  Creatinine 0.6 - 1.1 mg/dL 0.9 0.8 0.8  Sodium 136 - 145 mEq/L 141 142 140  Potassium 3.5 - 5.1 mEq/L 5.0 3.9 4.0  Chloride 96 - 112 mEq/L - - -  CO2 22 - 29 mEq/L 29 26 24   Calcium 8.4 - 10.4 mg/dL 9.1 9.3 9.3  Total Protein 6.4 - 8.3 g/dL 6.6 7.0 7.2  Total Bilirubin 0.20 - 1.20 mg/dL 0.53 0.60 0.38  Alkaline Phos 40 - 150 U/L 83 84 86  AST 5 - 34 U/L 22 23 19   ALT 0 - 55 U/L 19 27 19    Results for Kayla, Mayo (MRN [id number]) as of 04/05/2016 11:27  Ref. Range 03/30/2015 13:04 07/20/2015 10:48 10/12/2015 12:15 01/12/2016 10:18  Iron Latest Ref Range: 41 - 142 ug/dL 75 83 101 108  UIBC Latest Ref Range: 120 - 384 ug/dL 174 144 153 152  TIBC Latest Ref Range: 236 - 444 ug/dL 249 226 (L) 254 260  %SAT Latest Ref Range: 21 - 57 % 30 37 40 42  Ferritin Latest Ref Range: 9 - 269 ng/ml 87 319 (H) 271 (H) 195    Ref Range & Units 46mo ago (01/12/16) 25mo ago (07/20/15) 71yr ago (03/17/14) 36yr ago (11/28/13)   Vitamin B12 211 - 946 pg/mL 608  874       RADIOGRAPHIC STUDIES:  Mammogram 02/12/2016: IMPRESSION: No mammographic evidence of malignancy. A result letter of this screening mammogram will be mailed directly to the patient.  RECOMMENDATION: Screening mammogram in one year. (Code:SM-B-01Y) .  ASSESSMENT:    60 year old female s/p gastric bypass surgery  1. Anemia secondary to B12 deficiency and iron deficiency, secondary to gastric bypass surgery -Continue monthly B12 injection, her last B12 level has been normal  -She developed a mild anemia today with hemoglobin 11.3 today. -Her ferritin level was normal 2 months ago, repeated level  today is still pending -If her ferritin is less than 100, I'll set up IV Feraheme for her -We'll continue to monitor her CBC and iron studies every 3 months  2. Arthritis, DM,  fatigue, insomnia -She'll continue follow-up with her primary care physician -She will also follow-up with her pain specialist for her arthralgia and pain management   3. She will continue follow-up with her primary care physician for other medical problems.  Plan -Lab every 3 months, I'll see her back in 6 months, IV Feraheme if ferritin less than 100, I will call her when her iron study result is back    Truitt Merle  04/05/2016,

## 2016-04-05 NOTE — Telephone Encounter (Signed)
Gave patient avs report and appointments for January thru June.  °

## 2016-04-11 ENCOUNTER — Telehealth: Payer: Self-pay | Admitting: *Deleted

## 2016-04-11 NOTE — Telephone Encounter (Signed)
Received call from pt asking about lab results.  Discussed with Dr Burr Medico & called pt back & informed that labs were good & she doesn't need iron infusion.

## 2016-04-14 ENCOUNTER — Encounter: Payer: Self-pay | Admitting: Neurology

## 2016-04-14 ENCOUNTER — Telehealth: Payer: Self-pay | Admitting: Neurology

## 2016-04-14 NOTE — Telephone Encounter (Signed)
Please offer patient FU appointment to discuss sleep study results and residual sleep issues.

## 2016-04-18 NOTE — Telephone Encounter (Signed)
I called pt. I offered her an appt on 04/21/2016 at 11:30am with Dr. Rexene Alberts. Pt is agreeable to this appt, and verbalized understanding of appt date and time.

## 2016-04-21 ENCOUNTER — Encounter: Payer: Self-pay | Admitting: Neurology

## 2016-04-21 ENCOUNTER — Ambulatory Visit (INDEPENDENT_AMBULATORY_CARE_PROVIDER_SITE_OTHER): Payer: Medicare HMO | Admitting: Neurology

## 2016-04-21 VITALS — BP 137/82 | HR 66 | Resp 20 | Ht 66.0 in | Wt 234.0 lb

## 2016-04-21 DIAGNOSIS — G4761 Periodic limb movement disorder: Secondary | ICD-10-CM | POA: Diagnosis not present

## 2016-04-21 DIAGNOSIS — G479 Sleep disorder, unspecified: Secondary | ICD-10-CM | POA: Diagnosis not present

## 2016-04-21 NOTE — Progress Notes (Signed)
2Subjective:    Patient ID: Kayla Mayo is a 60 y.o. female.  HPI     Interim history:   Kayla Mayo is a 60 year old right-handed woman with an underlying medical history of hypertension, hyperlipidemia, diabetes, status post gastric bypass surgery, obesity, vitamin B12 deficiency, iron deficiency anemia, chronic back pain, reflux disease, anxiety, depression, IBS, and recent emergency room visit on 11/17/2015 for fractured right toe, who presents for follow-up consultation of her sleep disturbance, after her recent sleep study. The patient is unaccompanied today. I first met her on 01/21/2016 at the request of her primary care provider, at which time the patient reported snoring and excessive daytime somnolence. She was previously diagnosed with obstructive sleep apnea but had bariatric surgery and lost about 100 pounds. I invited her for sleep study. She had a baseline sleep study on 02/19/2016. I went over her test results with her in detail today. Sleep efficiency was 85.6%, sleep latency 31.5 minutes and REM latency mildly prolonged at 132.5 minutes. Wake after sleep onset was 39 minutes with mild sleep fragmentation noted. She had an increased percentage of slow-wave sleep and REM sleep was mildly reduced at 16.6%. She had a total AHI of 1.2 per hour, REM AHI was 3 per hour, supine AHI was 36 per hour. She spent only 5 minutes in supine position and most of her study was in the nonsupine position. Average oxygen saturation was 98%, nadir was 86%, time below 89% saturation was 0 minutes. PLM index was 27.3 per hour, with very mild arousals with a PLM associated arousal index of 1.5 per hour.  Today, 04/21/2016: She reports snoring is worse, but seems intermittent, sleeps on her sides with 2 body pillows flanking her. No significant restless leg symptoms. She used to have some RLS symptoms but when she started gabapentin for her neuropathy her RLS symptoms subsided. She has residual back pain.  She takes narcotic pain medication, usually qid. She reports bilateral knee pain, right more than left.  Previously:  01/21/2016: She reports snoring and excessive daytime somnolence. I reviewed your office note from 12/08/2015. I reviewed blood work from 10/22/2015. CMP was unremarkable with the exception of glucose at 151. Lipid panel showed cholesterol of 195, triglycerides 91, LDL 91. Hemoglobin A1c was 6.5. CBC with differential was unremarkable. B12 level was 833. Ferritin was 309. She was diagnosed with obstructive sleep apnea years ago and was advised to use CPAP therapy, but did not pursue it at the time. She then had bariatric surgery and lost about 100 pounds and had a repeat sleep study in or around 2009 and was told at the time that she no longer needed CPAP therapy. She complains of difficulty maintaining sleep, and daytime somnolence. Her Epworth sleepiness score is 2 out of 24 today, her fatigue score is 61 out of 63. She lives with her husband. She has 2 grown children. She does not work.  She wakes up with cough around 3 AM. She goes to bed around 10 PM, takes trazodone 100 mg, but wakes up early AM. She goes to the bathroom once. Husband works nights.  She gets out of bed by 7:30. She has occasional morning HAs. She rarely can take a nap.  She was an Astronomer and a back teller.  She has LBP, sees Dr. Emmaline Kluver, pending RFA in 10/17 in [place].  She is a lifelong non-smoker, does not drink alchohol or caffeine.  She endorses restless leg symptoms and moving a  lot with her legs at night, sometimes legs jerk at night which wakes her up.   Her Past Medical History Is Significant For: Past Medical History:  Diagnosis Date  . Anal fissure   . Anemia 03/17/2011  . Anxiety   . Arthritis   . Breast cancer ([place])   . Bursitis of hip   . Chronic headaches   . Colon polyps   . Depression   . Diabetes mellitus   . Esophageal spasm   . Gallstones   .  GERD (gastroesophageal reflux disease)   . Hyperlipidemia   . Hypertension   . IBS (irritable bowel syndrome)   . Neuropathy ([place])   . Obesity   . Osteopenia 07/2011   t score -1.5 FRAX 2.1%/0%  . Status post cardiac catheterization   . Vertigo    Occurs 6 times a month    Her Past Surgical History Is Significant For: Past Surgical History:  Procedure Laterality Date  . APPENDECTOMY    . bilareral knee scopes    . CARPAL TUNNEL RELEASE    . CHOLECYSTECTOMY    . GASTRIC BYPASS    . left shouler surgery    . lower back surgery    . TUBAL LIGATION      Her Family History Is Significant For: Family History  Problem Relation Age of Onset  . Diabetes Mother   . Stroke Mother   . Heart disease Mother   . Hypertension Mother   . Hyperlipidemia Mother   . Anxiety disorder Mother   . Diabetes Brother   . Alcohol abuse Brother   . Drug abuse Brother   . Alcohol abuse Father   . Cancer Father     Stomach  . Diabetes Maternal Aunt   . Diabetes Maternal Uncle   . Alcohol abuse Maternal Uncle   . Diabetes Maternal Grandmother   . Dementia Maternal Grandmother   . Depression Maternal Grandmother   . Alcohol abuse Paternal Uncle   . Cancer Paternal Uncle     Throat  . Alcohol abuse Maternal Grandfather   . Alcohol abuse Paternal Grandfather   . Alcohol abuse Maternal Uncle   . Alcohol abuse Paternal Uncle   . Alcohol abuse Paternal Uncle   . Cancer Paternal Aunt     Leukemia    Her Social History Is Significant For: Social History   Social History  . Marital status: Married    Spouse name: N/A  . Number of children: 2  . Years of education: 14   Occupational History  . Disabled Unemployed   Social History Main Topics  . Smoking status: Never Smoker  . Smokeless tobacco: Never Used  . Alcohol use No  . Drug use: No  . Sexual activity: Not Currently    Partners: Male    Birth control/ protection: Post-menopausal, Surgical     Comment: Tubal lig   Other  Topics Concern  . None   Social History Narrative   Denies caffeine use     Her Allergies Are:  Allergies  Allergen Reactions  . Chicken Allergy     Sinus drainage  . Morphine And Related Itching  . Codeine   . Corn-Containing Products   . Other     Statins  . Peanut-Containing Drug Products   . Sesame Oil     Sinus drainage  . Simvastatin     Muscle pain  . Wheat     Sinus drainage  . Lisinopril Cough  :  Her Current Medications Are:  Outpatient Encounter Prescriptions as of 04/21/2016  Medication Sig  . ACCU-CHEK FASTCLIX LANCETS MISC Use daily as directed for Dx: 250.00  . aspirin 81 MG tablet Take 81 mg by mouth daily.  . clobetasol ointment (TEMOVATE) 0.05 % 2 (two) times daily. On scalp bid  . cyanocobalamin (,VITAMIN B-12,) 1000 MCG/ML injection Inject 1,000 mcg into the muscle every 30 (thirty) days.    . cyclobenzaprine (FLEXERIL) 10 MG tablet Take 10 mg by mouth 3 (three) times daily as needed.  . diclofenac sodium (VOLTAREN) 1 % GEL APPLY 2g 4 times a day applied topically 30 day(s)  . EPIPEN 2-PAK 0.3 MG/0.3ML DEVI as needed. Reported on 10/12/2015  . escitalopram (LEXAPRO) 10 MG tablet Take 1.5 tablets (15 mg total) by mouth daily. one a day.  . fluticasone (FLONASE) 50 MCG/ACT nasal spray Place 2 sprays into both nostrils 2 (two) times daily.  . furosemide (LASIX) 20 MG tablet Take 1 tablet (20 mg total) by mouth 2 (two) times daily.  Marland Kitchen gabapentin (NEURONTIN) 300 MG capsule Take 600 mg by mouth 2 (two) times daily.   Marland Kitchen HYDROcodone-acetaminophen (NORCO) 7.5-325 MG tablet Take 1 tablet by mouth 3 (three) times daily as needed for moderate pain.  . hydrocortisone (PROCTOSOL HC) 2.5 % rectal cream Place 1 application rectally as needed.  Marland Kitchen levothyroxine (SYNTHROID, LEVOTHROID) 25 MCG tablet Take 25 mcg by mouth.  . Linaclotide (LINZESS) 290 MCG CAPS capsule Take 1 capsule by mouth daily.  . meclizine (ANTIVERT) 25 MG tablet Take 25 mg by mouth 3 (three) times  daily as needed.    . metFORMIN (GLUCOPHAGE) 850 MG tablet Take 850 mg by mouth 2 (two) times daily with a meal.  . naproxen (NAPROSYN) 500 MG tablet Take 1 tablet (500 mg total) by mouth 2 (two) times daily with a meal.  . pantoprazole (PROTONIX) 20 MG tablet Take 40 mg by mouth daily.   . polyethylene glycol powder (GLYCOLAX/MIRALAX) powder Take 17 g by mouth 2 (two) times daily.  . rosuvastatin (CRESTOR) 10 MG tablet Take 10 mg by mouth daily.   . traZODone (DESYREL) 100 MG tablet 1& 1/2 TABLETS BY MOUTH AT BEDTIME  . valsartan (DIOVAN) 80 MG tablet   . verapamil (CALAN) 80 MG tablet Take 1 tablet (80 mg total) by mouth daily.   Facility-Administered Encounter Medications as of 04/21/2016  Medication  . cyanocobalamin ((VITAMIN B-12)) injection 1,000 mcg  :  Review of Systems:  Out of a complete 14 point review of systems, all are reviewed and negative with the exception of these symptoms as listed below: Review of Systems  Neurological:       Pt presents today to discuss her sleep study results. Pt reports that her snoring has gotten worse.    Objective:  Neurologic Exam  Physical Exam Physical Examination:   Vitals:   04/21/16 1152  BP: 137/82  Pulse: 66  Resp: 20   General Examination: The patient is a very pleasant 60 y.o. female in no acute distress. She appears well-developed and well-nourished and well groomed.   HEENT: Normocephalic, atraumatic, pupils are equal, round and reactive to light and accommodation. Extraocular tracking is good without limitation to gaze excursion or nystagmus noted. Normal smooth pursuit is noted. Hearing is grossly intact. Face is symmetric with normal facial animation and normal facial sensation. Speech is clear with no dysarthria noted. There is no hypophonia. There is no lip, neck/head, jaw or voice tremor. Neck is supple  with full range of passive and active motion. There are no carotid bruits on auscultation. Oropharynx exam reveals:  moderate mouth dryness, adequate dental hygiene and moderate airway crowding, due to larger tongue, larger uvula, tonsils of 2+. Mallampati is class II. Tongue protrudes centrally and palate elevates symmetrically.   Chest: Clear to auscultation without wheezing, rhonchi or crackles noted.  Heart: S1+S2+0, regular and normal without murmurs, rubs or gallops noted.   Abdomen: Soft, non-tender and non-distended with normal bowel sounds appreciated on auscultation.  Extremities: There is no pitting edema in the distal lower extremities bilaterally. Pedal pulses are intact.  Skin: Warm and dry without trophic changes noted. There are no varicose veins.  Musculoskeletal: exam reveals no obvious joint deformities, tenderness or joint swelling or erythema, with the exception of bilateral knee pain. She also reports low back pain. She did not bring her cane.   Neurologically:   Mental status: The patient is awake, alert and oriented in all 4 spheres. Her immediate and remote memory, attention, language skills and fund of knowledge are appropriate. There is no evidence of aphasia, agnosia, apraxia or anomia. Speech is clear with normal prosody and enunciation. Thought process is linear. Mood is normal and affect is normal.  Cranial nerves II - XII are as described above under HEENT exam. In addition: shoulder shrug is normal with equal shoulder height noted. Motor exam: Normal bulk, strength and tone is noted. There is no drift, tremor or rebound. Romberg is negative. Reflexes are 1-2+ throughout. Fine motor skills and coordination: intact in the UEs and LEs.  Cerebellar testing: No dysmetria or intention tremor.  Sensory exam: intact to light touch in the upper and lower extremities.  Gait, station and balance: She stands with difficulty and husband helps her. She stands wide-based. She walks slowly.   Assessment and Plan:    In summary, Kayla Mayo is a very pleasant 60 year old female with an  underlying medical history of hypertension, hyperlipidemia, diabetes, status post gastric bypass surgery, obesity, vitamin B12 deficiency, iron deficiency anemia, chronic back pain, reflux disease, anxiety, depression, IBS, and recent emergency room visit on 11/17/2015 for fractured right toe, who was previously diagnosed with obstructive sleep apnea prior to her weight loss surgery. She presents for follow-up consultation after her recent diagnostic sleep study which did not show any significant obstructive sleep apnea with an AHI less than 2 per hour. She does have supine sleep apnea but does not sleep supine at home secondary to back pain. She is advised to continue to try to sleep on her sides. If snoring becomes bothersome she is advised to look into an oral appliance, for this she would have to see a dentist who can make her a custom made dental device. At this point she feels that the snoring is not constant and severe and can be monitored. She denies any frank restless leg symptoms. She was advised that she had borderline moderate PLMs without significant arousals, this could be in part related to taking antidepressant medication, namely generic Lexapro. I talked to her about keeping good sleep hygiene and striving for weight loss. At this juncture, I suggested as needed follow-up. I answered all their questions today and the patient and her husband were in agreement.  I spent 25 minutes in total face-to-face time with the patient, more than 50% of which was spent in counseling and coordination of care, reviewing test results, reviewing medication and discussing or reviewing the diagnosis of sleep disturbance, snoring, OSA,  PLMs, the prognosis and treatment options.

## 2016-04-21 NOTE — Patient Instructions (Signed)
Your sleep study did not show any significant obstructive sleep apnea, only when you sleep on your back, which you avoid at home anyway.   You had borderline moderate leg twitching in sleep, which did not disturb your sleep very much.  You can be monitored for restless legs symptoms. Keep in mind restless legs syndrome (RLS) is associated with anemia and iron deficiency and some of the leg twitching could be medication related from your Lexapro.  For bothersome snoring, you may be able to get a dental appliance through a dentist.    Please remember to try to maintain good sleep hygiene, which means: Keep a regular sleep and wake schedule, try not to exercise or have a meal within 2 hours of your bedtime, try to keep your bedroom conducive for sleep, that is, cool and dark, without light distractors such as an illuminated alarm clock, and refrain from watching TV right before sleep or in the middle of the night and do not keep the TV or radio on during the night. Also, try not to use or play on electronic devices at bedtime, such as your cell phone, tablet PC or laptop. If you like to read at bedtime on an electronic device, try to dim the background light as much as possible. Do not eat in the middle of the night.   I will see you back as needed.

## 2016-05-03 ENCOUNTER — Ambulatory Visit (HOSPITAL_BASED_OUTPATIENT_CLINIC_OR_DEPARTMENT_OTHER): Payer: Medicare HMO

## 2016-05-03 VITALS — BP 150/82 | HR 71 | Temp 98.6°F | Resp 18

## 2016-05-03 DIAGNOSIS — D510 Vitamin B12 deficiency anemia due to intrinsic factor deficiency: Secondary | ICD-10-CM

## 2016-05-03 MED ORDER — CYANOCOBALAMIN 1000 MCG/ML IJ SOLN
1000.0000 ug | Freq: Once | INTRAMUSCULAR | Status: AC
  Administered 2016-05-03: 1000 ug via INTRAMUSCULAR

## 2016-05-03 NOTE — Patient Instructions (Signed)

## 2016-05-06 ENCOUNTER — Ambulatory Visit (INDEPENDENT_AMBULATORY_CARE_PROVIDER_SITE_OTHER): Payer: Medicare HMO | Admitting: Psychiatry

## 2016-05-06 ENCOUNTER — Encounter (HOSPITAL_COMMUNITY): Payer: Self-pay | Admitting: Psychiatry

## 2016-05-06 VITALS — BP 126/70 | HR 75 | Resp 16 | Ht 66.0 in | Wt 232.0 lb

## 2016-05-06 DIAGNOSIS — Z91010 Allergy to peanuts: Secondary | ICD-10-CM

## 2016-05-06 DIAGNOSIS — Z811 Family history of alcohol abuse and dependence: Secondary | ICD-10-CM

## 2016-05-06 DIAGNOSIS — Z8249 Family history of ischemic heart disease and other diseases of the circulatory system: Secondary | ICD-10-CM

## 2016-05-06 DIAGNOSIS — Z806 Family history of leukemia: Secondary | ICD-10-CM

## 2016-05-06 DIAGNOSIS — F411 Generalized anxiety disorder: Secondary | ICD-10-CM

## 2016-05-06 DIAGNOSIS — F3341 Major depressive disorder, recurrent, in partial remission: Secondary | ICD-10-CM | POA: Diagnosis not present

## 2016-05-06 DIAGNOSIS — Z800 Family history of malignant neoplasm of digestive organs: Secondary | ICD-10-CM

## 2016-05-06 DIAGNOSIS — Z91018 Allergy to other foods: Secondary | ICD-10-CM

## 2016-05-06 DIAGNOSIS — G894 Chronic pain syndrome: Secondary | ICD-10-CM

## 2016-05-06 DIAGNOSIS — Z7982 Long term (current) use of aspirin: Secondary | ICD-10-CM

## 2016-05-06 DIAGNOSIS — F5102 Adjustment insomnia: Secondary | ICD-10-CM

## 2016-05-06 DIAGNOSIS — Z888 Allergy status to other drugs, medicaments and biological substances status: Secondary | ICD-10-CM

## 2016-05-06 DIAGNOSIS — Z79899 Other long term (current) drug therapy: Secondary | ICD-10-CM

## 2016-05-06 DIAGNOSIS — Z833 Family history of diabetes mellitus: Secondary | ICD-10-CM

## 2016-05-06 DIAGNOSIS — Z813 Family history of other psychoactive substance abuse and dependence: Secondary | ICD-10-CM

## 2016-05-06 DIAGNOSIS — Z801 Family history of malignant neoplasm of trachea, bronchus and lung: Secondary | ICD-10-CM

## 2016-05-06 DIAGNOSIS — Z818 Family history of other mental and behavioral disorders: Secondary | ICD-10-CM

## 2016-05-06 DIAGNOSIS — F0630 Mood disorder due to known physiological condition, unspecified: Secondary | ICD-10-CM | POA: Diagnosis not present

## 2016-05-06 MED ORDER — ESCITALOPRAM OXALATE 10 MG PO TABS: 15.0000 mg | ORAL_TABLET | Freq: Every day | ORAL | 2 refills | Status: DC

## 2016-05-06 NOTE — Progress Notes (Signed)
[facility] Outpatient Follow up visit  Patient Identification: Kayla Mayo MRN:  [id number] Date of Evaluation:  05/06/2016 Referral Source: [name] and herself  Chief Complaint:   Chief Complaint    Follow-up     Visit Diagnosis:    ICD-9-CM ICD-10-CM   1. Major depressive disorder, recurrent episode, in partial remission ([place]) 296.35 F33.41   2. GAD (generalized anxiety disorder) 300.02 F41.1   3. Mood disorder in conditions classified elsewhere 293.83 F06.30   4. Chronic pain syndrome 338.4 G89.4   5. Adjustment insomnia 307.41 F51.02     History of Present Illness:  61 years old currently married African-American female initially referred for management of depression and anxiety she suffers from chronic medical illnesses including fibromyalgia, chronic back pain and multiple surgeries history of gastric bypass.  Last visit we increased Lexapro to 15 mg at S DEPRESSION but she is experiencing some food craving she is also on trazodone for sleep Suffers from pain she is on pain medications pain affects her mood symptoms. Husband was somewhat distant but is more better in regarding to understanding her condition Anxiety fluctuates. She mostly stays at home  Aggravating factor: distant husband. Multiple medical problems ( see list) limitations and pain Modifying factor: her will power. Her kids Severity of depression: 7/10. 10 being no depression     Substance Abuse History in the last 12 months:  No.  Consequences of Substance Abuse: NA  Past Medical History:  Past Medical History:  Diagnosis Date  . Anal fissure   . Anemia 03/17/2011  . Anxiety   . Arthritis   . Breast cancer ([place])   . Bursitis of hip   . Chronic headaches   . Colon polyps   . Depression   . Diabetes mellitus   . Esophageal spasm   . Gallstones   . GERD (gastroesophageal reflux disease)   . Hyperlipidemia   . Hypertension   . IBS (irritable bowel syndrome)   . Neuropathy ([place])   . Obesity    . Osteopenia 07/2011   t score -1.5 FRAX 2.1%/0%  . Status post cardiac catheterization   . Vertigo    Occurs 6 times a month    Past Surgical History:  Procedure Laterality Date  . APPENDECTOMY    . bilareral knee scopes    . CARPAL TUNNEL RELEASE    . CHOLECYSTECTOMY    . GASTRIC BYPASS    . left shouler surgery    . lower back surgery    . TUBAL LIGATION      Family Psychiatric History: not aware or denies  Family History:  Family History  Problem Relation Age of Onset  . Diabetes Mother   . Stroke Mother   . Heart disease Mother   . Hypertension Mother   . Hyperlipidemia Mother   . Anxiety disorder Mother   . Diabetes Brother   . Alcohol abuse Brother   . Drug abuse Brother   . Alcohol abuse Father   . Cancer Father     Stomach  . Diabetes Maternal Aunt   . Diabetes Maternal Uncle   . Alcohol abuse Maternal Uncle   . Diabetes Maternal Grandmother   . Dementia Maternal Grandmother   . Depression Maternal Grandmother   . Alcohol abuse Paternal Uncle   . Cancer Paternal Uncle     Throat  . Alcohol abuse Maternal Grandfather   . Alcohol abuse Paternal Grandfather   . Alcohol abuse Maternal Uncle   . Alcohol  abuse Paternal Uncle   . Alcohol abuse Paternal Uncle   . Cancer Paternal Aunt     Leukemia    Social History:   Social History   Social History  . Marital status: Married    Spouse name: N/A  . Number of children: 2  . Years of education: 14   Occupational History  . Disabled Unemployed   Social History Main Topics  . Smoking status: Never Smoker  . Smokeless tobacco: Never Used  . Alcohol use No  . Drug use: No  . Sexual activity: Not Currently    Partners: Male    Birth control/ protection: Post-menopausal, Surgical     Comment: Tubal lig   Other Topics Concern  . None   Social History Narrative   Denies caffeine use      Allergies:   Allergies  Allergen Reactions  . Chicken Allergy     Sinus drainage  . Morphine And  Related Itching  . Codeine   . Corn-Containing Products   . Other     Statins  . Peanut-Containing Drug Products   . Sesame Oil     Sinus drainage  . Simvastatin     Muscle pain  . Wheat     Sinus drainage  . Lisinopril Cough    Metabolic Disorder Labs: Lab Results  Component Value Date   HGBA1C 6.6 (H) 03/23/2011   MPG 134 11/27/2008   No results found for: PROLACTIN Lab Results  Component Value Date   CHOL 183 03/23/2011   TRIG 69.0 03/23/2011   HDL 69.60 03/23/2011   CHOLHDL 3 03/23/2011   VLDL 13.8 03/23/2011   LDLCALC 100 (H) 03/23/2011   LDLCALC 113 (H) 08/24/2010     Current Medications: Current Outpatient Prescriptions  Medication Sig Dispense Refill  . ACCU-CHEK FASTCLIX LANCETS MISC Use daily as directed for Dx: 250.00    . aspirin 81 MG tablet Take 81 mg by mouth daily.    . clobetasol ointment (TEMOVATE) 0.05 % 2 (two) times daily. On scalp bid  2  . cyanocobalamin (,VITAMIN B-12,) 1000 MCG/ML injection Inject 1,000 mcg into the muscle every 30 (thirty) days.      . cyclobenzaprine (FLEXERIL) 10 MG tablet Take 10 mg by mouth 3 (three) times daily as needed.    . diclofenac sodium (VOLTAREN) 1 % GEL APPLY 2g 4 times a day applied topically 30 day(s)    . EPIPEN 2-PAK 0.3 MG/0.3ML DEVI as needed. Reported on 10/12/2015    . escitalopram (LEXAPRO) 10 MG tablet Take 1.5 tablets (15 mg total) by mouth daily. one a day. 45 tablet 2  . fluticasone (FLONASE) 50 MCG/ACT nasal spray Place 2 sprays into both nostrils 2 (two) times daily.    . furosemide (LASIX) 20 MG tablet Take 1 tablet (20 mg total) by mouth 2 (two) times daily. 180 tablet 3  . gabapentin (NEURONTIN) 300 MG capsule Take 600 mg by mouth 2 (two) times daily.     Marland Kitchen HYDROcodone-acetaminophen (NORCO) 7.5-325 MG tablet Take 1 tablet by mouth 3 (three) times daily as needed for moderate pain.    . hydrocortisone (PROCTOSOL HC) 2.5 % rectal cream Place 1 application rectally as needed. 30 g 0  .  levothyroxine (SYNTHROID, LEVOTHROID) 25 MCG tablet Take 25 mcg by mouth.    . Linaclotide (LINZESS) 290 MCG CAPS capsule Take 1 capsule by mouth daily.    . meclizine (ANTIVERT) 25 MG tablet Take 25 mg by mouth 3 (three) times  daily as needed.      . metFORMIN (GLUCOPHAGE) 850 MG tablet Take 850 mg by mouth 2 (two) times daily with a meal.    . naproxen (NAPROSYN) 500 MG tablet Take 1 tablet (500 mg total) by mouth 2 (two) times daily with a meal. 20 tablet 0  . pantoprazole (PROTONIX) 20 MG tablet Take 40 mg by mouth daily.     . polyethylene glycol powder (GLYCOLAX/MIRALAX) powder Take 17 g by mouth 2 (two) times daily. 255 g 0  . rosuvastatin (CRESTOR) 10 MG tablet Take 10 mg by mouth daily.     . traZODone (DESYREL) 100 MG tablet 1& 1/2 TABLETS BY MOUTH AT BEDTIME 135 tablet 1  . valsartan (DIOVAN) 80 MG tablet   5  . verapamil (CALAN) 80 MG tablet Take 1 tablet (80 mg total) by mouth daily. 90 tablet 3   No current facility-administered medications for this visit.    Facility-Administered Medications Ordered in Other Visits  Medication Dose Route Frequency Provider Last Rate Last Dose  . cyanocobalamin ((VITAMIN B-12)) injection 1,000 mcg  1,000 mcg Intramuscular Q28 days Marcy Panning, MD          Psychiatric Specialty Exam: Review of Systems  Constitutional: Negative for fever.  Cardiovascular: Negative for palpitations.  Gastrointestinal: Negative for nausea.  Musculoskeletal: Positive for back pain and myalgias.  Skin: Negative for rash.  Psychiatric/Behavioral: Positive for depression. Negative for substance abuse and suicidal ideas.    Blood pressure 126/70, pulse 75, resp. rate 16, height 5\' 6"  (1.676 m), weight 232 lb (105.2 kg), last menstrual period 05/02/2006, SpO2 96 %.Body mass index is 37.45 kg/m.  General Appearance: Casual  Eye Contact:  Fair  Speech:  Slow  Volume:  Decreased  Mood:   dysthymic  Affect:  congruent  Thought Process:  Goal Directed   Orientation:  Full (Time, Place, and Person)  Thought Content:  Rumination  Suicidal Thoughts:  No  Homicidal Thoughts:  No  Memory:  Immediate;   Poor Recent;   Fair  Judgement:  Fair  Insight:  Shallow  Psychomotor Activity:  Decreased  Concentration:  Concentration: Fair and Attention Span: Fair  Recall:  [employer] of Knowledge:Fair  Language: Fair  Akathisia:  No  Handed:  Right  AIMS (if indicated):    Assets:  Desire for Improvement Resilience  ADL's:  Intact  Cognition: WNL  Sleep:  fair    Treatment Plan Summary: Medication management and Plan as follows  Mood disorder NOS: may be related with multiple medical concerns, pain , psychosocial issues.  Continue lexapro. Also on neurontin Depression: would not increase more of lexapro as it has hellped some and concern of food craving she needs to work on it. Continue lexapro 15mg   Refills sent GAD: lexapro increase to 15mg .  InsomniaL: reviewed sleep hygiene. Should follow up with sleep specialist to review study and options. Continue trazadone by primary care neutonitn has helped as wells some  Pain condition: follow up with providers for pain  Continue therapy  Follow up 2 months    Merian Capron, MD 1/5/201811:09 AM

## 2016-05-31 ENCOUNTER — Ambulatory Visit (HOSPITAL_BASED_OUTPATIENT_CLINIC_OR_DEPARTMENT_OTHER): Payer: Medicare HMO

## 2016-05-31 VITALS — BP 152/88 | HR 68 | Temp 97.8°F | Resp 20

## 2016-05-31 DIAGNOSIS — D510 Vitamin B12 deficiency anemia due to intrinsic factor deficiency: Secondary | ICD-10-CM | POA: Diagnosis not present

## 2016-05-31 DIAGNOSIS — D519 Vitamin B12 deficiency anemia, unspecified: Secondary | ICD-10-CM

## 2016-05-31 MED ORDER — CYANOCOBALAMIN 1000 MCG/ML IJ SOLN
1000.0000 ug | Freq: Once | INTRAMUSCULAR | Status: AC
  Administered 2016-05-31: 1000 ug via INTRAMUSCULAR

## 2016-05-31 NOTE — Patient Instructions (Signed)

## 2016-06-28 ENCOUNTER — Ambulatory Visit (HOSPITAL_BASED_OUTPATIENT_CLINIC_OR_DEPARTMENT_OTHER): Payer: Medicare HMO

## 2016-06-28 ENCOUNTER — Other Ambulatory Visit (HOSPITAL_BASED_OUTPATIENT_CLINIC_OR_DEPARTMENT_OTHER): Payer: Medicare HMO

## 2016-06-28 VITALS — BP 157/87 | HR 75 | Temp 99.0°F | Resp 18

## 2016-06-28 DIAGNOSIS — D510 Vitamin B12 deficiency anemia due to intrinsic factor deficiency: Secondary | ICD-10-CM | POA: Diagnosis not present

## 2016-06-28 DIAGNOSIS — D519 Vitamin B12 deficiency anemia, unspecified: Secondary | ICD-10-CM

## 2016-06-28 DIAGNOSIS — D6489 Other specified anemias: Secondary | ICD-10-CM

## 2016-06-28 LAB — CBC & DIFF AND RETIC
BASO%: 0.6 % (ref 0.0–2.0)
BASOS ABS: 0 10*3/uL (ref 0.0–0.1)
EOS ABS: 0.3 10*3/uL (ref 0.0–0.5)
EOS%: 6 % (ref 0.0–7.0)
HEMATOCRIT: 37.8 % (ref 34.8–46.6)
HEMOGLOBIN: 12.2 g/dL (ref 11.6–15.9)
Immature Retic Fract: 2.8 % (ref 1.60–10.00)
LYMPH%: 33.1 % (ref 14.0–49.7)
MCH: 30.6 pg (ref 25.1–34.0)
MCHC: 32.3 g/dL (ref 31.5–36.0)
MCV: 94.7 fL (ref 79.5–101.0)
MONO#: 0.3 10*3/uL (ref 0.1–0.9)
MONO%: 4.8 % (ref 0.0–14.0)
NEUT#: 2.9 10*3/uL (ref 1.5–6.5)
NEUT%: 55.5 % (ref 38.4–76.8)
PLATELETS: 225 10*3/uL (ref 145–400)
RBC: 3.99 10*6/uL (ref 3.70–5.45)
RDW: 13.2 % (ref 11.2–14.5)
RETIC %: 1.46 % (ref 0.70–2.10)
Retic Ct Abs: 58.25 10*3/uL (ref 33.70–90.70)
WBC: 5.2 10*3/uL (ref 3.9–10.3)
lymph#: 1.7 10*3/uL (ref 0.9–3.3)

## 2016-06-28 LAB — IRON AND TIBC
%SAT: 51 % (ref 21–57)
IRON: 137 ug/dL (ref 41–142)
TIBC: 270 ug/dL (ref 236–444)
UIBC: 133 ug/dL (ref 120–384)

## 2016-06-28 LAB — COMPREHENSIVE METABOLIC PANEL
ALBUMIN: 3.8 g/dL (ref 3.5–5.0)
ALK PHOS: 86 U/L (ref 40–150)
ALT: 19 U/L (ref 0–55)
ANION GAP: 6 meq/L (ref 3–11)
AST: 21 U/L (ref 5–34)
BUN: 13.1 mg/dL (ref 7.0–26.0)
CALCIUM: 9.5 mg/dL (ref 8.4–10.4)
CHLORIDE: 104 meq/L (ref 98–109)
CO2: 31 mEq/L — ABNORMAL HIGH (ref 22–29)
CREATININE: 0.8 mg/dL (ref 0.6–1.1)
EGFR: 89 mL/min/{1.73_m2} — ABNORMAL LOW (ref >90)
Glucose: 164 mg/dl — ABNORMAL HIGH (ref 70–140)
POTASSIUM: 4.5 meq/L (ref 3.5–5.1)
Sodium: 142 mEq/L (ref 136–145)
Total Bilirubin: 0.68 mg/dL (ref 0.20–1.20)
Total Protein: 6.9 g/dL (ref 6.4–8.3)

## 2016-06-28 LAB — FERRITIN: FERRITIN: 184 ng/mL (ref 9–269)

## 2016-06-28 MED ORDER — CYANOCOBALAMIN 1000 MCG/ML IJ SOLN
1000.0000 ug | Freq: Once | INTRAMUSCULAR | Status: AC
  Administered 2016-06-28: 1000 ug via INTRAMUSCULAR

## 2016-06-28 NOTE — Patient Instructions (Signed)

## 2016-07-21 ENCOUNTER — Encounter (HOSPITAL_COMMUNITY): Payer: Self-pay | Admitting: Psychiatry

## 2016-07-21 ENCOUNTER — Ambulatory Visit (INDEPENDENT_AMBULATORY_CARE_PROVIDER_SITE_OTHER): Payer: Medicare HMO | Admitting: Psychiatry

## 2016-07-21 VITALS — BP 124/68 | HR 91 | Resp 16 | Ht 66.0 in | Wt 230.0 lb

## 2016-07-21 DIAGNOSIS — F411 Generalized anxiety disorder: Secondary | ICD-10-CM

## 2016-07-21 DIAGNOSIS — Z79899 Other long term (current) drug therapy: Secondary | ICD-10-CM

## 2016-07-21 DIAGNOSIS — F3341 Major depressive disorder, recurrent, in partial remission: Secondary | ICD-10-CM

## 2016-07-21 DIAGNOSIS — Z810 Family history of intellectual disabilities: Secondary | ICD-10-CM

## 2016-07-21 DIAGNOSIS — Z811 Family history of alcohol abuse and dependence: Secondary | ICD-10-CM

## 2016-07-21 DIAGNOSIS — F0630 Mood disorder due to known physiological condition, unspecified: Secondary | ICD-10-CM

## 2016-07-21 DIAGNOSIS — G894 Chronic pain syndrome: Secondary | ICD-10-CM

## 2016-07-21 DIAGNOSIS — Z813 Family history of other psychoactive substance abuse and dependence: Secondary | ICD-10-CM | POA: Diagnosis not present

## 2016-07-21 MED ORDER — ESCITALOPRAM OXALATE 10 MG PO TABS: 15.0000 mg | ORAL_TABLET | Freq: Every day | ORAL | 2 refills | Status: DC

## 2016-07-21 NOTE — Progress Notes (Signed)
[facility] Outpatient Follow up visit  Patient Identification: Kayla Mayo MRN:  [id number] Date of Evaluation:  07/21/2016 Referral Source: [name] and herself  Chief Complaint:   Chief Complaint    Follow-up     Visit Diagnosis:    ICD-9-CM ICD-10-CM   1. Major depressive disorder, recurrent episode, in partial remission ([place]) 296.35 F33.41   2. GAD (generalized anxiety disorder) 300.02 F41.1   3. Mood disorder in conditions classified elsewhere 293.83 F06.30   4. Chronic pain syndrome 338.4 G89.4     History of Present Illness:  61 years old currently married African-American female initially referred for management of depression and anxiety she suffers from chronic medical illnesses including fibromyalgia, chronic back pain and multiple surgeries history of gastric bypass.   Patient's pain specialist in the hip and joint continues to affect her mood but overall she feels Lexapro does help her anxiety and depression. She has a supportive husband but he does get somewhat concerning at times. She does also have other family members that she can call. Movement is limited because of pain  She takes trazodone from another provider for sleep  No reported side effects   Aggravating factor: distant husband. Multiple medical problems ( see list) limitations and pain Modifying factor: her will power. Family Severity of depression: 7/10. 10 being no depression    Past Medical History:  Past Medical History:  Diagnosis Date  . Anal fissure   . Anemia 03/17/2011  . Anxiety   . Arthritis   . Breast cancer ([place])   . Bursitis of hip   . Chronic headaches   . Colon polyps   . Depression   . Diabetes mellitus   . Esophageal spasm   . Gallstones   . GERD (gastroesophageal reflux disease)   . Hyperlipidemia   . Hypertension   . IBS (irritable bowel syndrome)   . Neuropathy ([place])   . Obesity   . Osteopenia 07/2011   t score -1.5 FRAX 2.1%/0%  . Status post cardiac  catheterization   . Vertigo    Occurs 6 times a month    Past Surgical History:  Procedure Laterality Date  . APPENDECTOMY    . bilareral knee scopes    . CARPAL TUNNEL RELEASE    . CHOLECYSTECTOMY    . GASTRIC BYPASS    . left shouler surgery    . lower back surgery    . TUBAL LIGATION      Family Psychiatric History: not aware or denies  Family History:  Family History  Problem Relation Age of Onset  . Diabetes Mother   . Stroke Mother   . Heart disease Mother   . Hypertension Mother   . Hyperlipidemia Mother   . Anxiety disorder Mother   . Diabetes Brother   . Alcohol abuse Brother   . Drug abuse Brother   . Alcohol abuse Father   . Cancer Father     Stomach  . Diabetes Maternal Aunt   . Diabetes Maternal Uncle   . Alcohol abuse Maternal Uncle   . Diabetes Maternal Grandmother   . Dementia Maternal Grandmother   . Depression Maternal Grandmother   . Alcohol abuse Paternal Uncle   . Cancer Paternal Uncle     Throat  . Alcohol abuse Maternal Grandfather   . Alcohol abuse Paternal Grandfather   . Alcohol abuse Maternal Uncle   . Alcohol abuse Paternal Uncle   . Alcohol abuse Paternal Uncle   . Cancer Paternal  Aunt     Leukemia    Social History:   Social History   Social History  . Marital status: Married    Spouse name: N/A  . Number of children: 2  . Years of education: 14   Occupational History  . Disabled Unemployed   Social History Main Topics  . Smoking status: Never Smoker  . Smokeless tobacco: Never Used  . Alcohol use No  . Drug use: No  . Sexual activity: Not Currently    Partners: Male    Birth control/ protection: Post-menopausal, Surgical     Comment: Tubal lig   Other Topics Concern  . None   Social History Narrative   Denies caffeine use      Allergies:   Allergies  Allergen Reactions  . Chicken Allergy     Sinus drainage  . Morphine And Related Itching  . Codeine   . Corn-Containing Products   . Other      Statins  . Peanut-Containing Drug Products   . Sesame Oil     Sinus drainage  . Simvastatin     Muscle pain  . Wheat     Sinus drainage  . Lisinopril Cough    Metabolic Disorder Labs: Lab Results  Component Value Date   HGBA1C 6.6 (H) 03/23/2011   MPG 134 11/27/2008   No results found for: PROLACTIN Lab Results  Component Value Date   CHOL 183 03/23/2011   TRIG 69.0 03/23/2011   HDL 69.60 03/23/2011   CHOLHDL 3 03/23/2011   VLDL 13.8 03/23/2011   LDLCALC 100 (H) 03/23/2011   LDLCALC 113 (H) 08/24/2010     Current Medications: Current Outpatient Prescriptions  Medication Sig Dispense Refill  . ACCU-CHEK FASTCLIX LANCETS MISC Use daily as directed for Dx: 250.00    . aspirin 81 MG tablet Take 81 mg by mouth daily.    . clobetasol ointment (TEMOVATE) 0.05 % 2 (two) times daily. On scalp bid  2  . cyanocobalamin (,VITAMIN B-12,) 1000 MCG/ML injection Inject 1,000 mcg into the muscle every 30 (thirty) days.      . cyclobenzaprine (FLEXERIL) 10 MG tablet Take 10 mg by mouth 3 (three) times daily as needed.    . diclofenac sodium (VOLTAREN) 1 % GEL APPLY 2g 4 times a day applied topically 30 day(s)    . EPIPEN 2-PAK 0.3 MG/0.3ML DEVI as needed. Reported on 10/12/2015    . escitalopram (LEXAPRO) 10 MG tablet Take 1.5 tablets (15 mg total) by mouth daily. one a day. 45 tablet 2  . fluticasone (FLONASE) 50 MCG/ACT nasal spray Place 2 sprays into both nostrils 2 (two) times daily.    . furosemide (LASIX) 20 MG tablet Take 1 tablet (20 mg total) by mouth 2 (two) times daily. 180 tablet 3  . gabapentin (NEURONTIN) 300 MG capsule Take 600 mg by mouth 2 (two) times daily.     Marland Kitchen HYDROcodone-acetaminophen (NORCO) 7.5-325 MG tablet Take 1 tablet by mouth 3 (three) times daily as needed for moderate pain.    . hydrocortisone (PROCTOSOL HC) 2.5 % rectal cream Place 1 application rectally as needed. 30 g 0  . levothyroxine (SYNTHROID, LEVOTHROID) 25 MCG tablet Take 25 mcg by mouth.    .  Linaclotide (LINZESS) 290 MCG CAPS capsule Take 1 capsule by mouth daily.    . meclizine (ANTIVERT) 25 MG tablet Take 25 mg by mouth 3 (three) times daily as needed.      . metFORMIN (GLUCOPHAGE) 850 MG tablet Take  850 mg by mouth 2 (two) times daily with a meal.    . naproxen (NAPROSYN) 500 MG tablet Take 1 tablet (500 mg total) by mouth 2 (two) times daily with a meal. 20 tablet 0  . pantoprazole (PROTONIX) 20 MG tablet Take 40 mg by mouth daily.     . polyethylene glycol powder (GLYCOLAX/MIRALAX) powder Take 17 g by mouth 2 (two) times daily. 255 g 0  . rosuvastatin (CRESTOR) 10 MG tablet Take 10 mg by mouth daily.     . traZODone (DESYREL) 100 MG tablet 1& 1/2 TABLETS BY MOUTH AT BEDTIME 135 tablet 1  . valsartan (DIOVAN) 80 MG tablet   5  . verapamil (CALAN) 80 MG tablet Take 1 tablet (80 mg total) by mouth daily. 90 tablet 3   No current facility-administered medications for this visit.    Facility-Administered Medications Ordered in Other Visits  Medication Dose Route Frequency Provider Last Rate Last Dose  . cyanocobalamin ((VITAMIN B-12)) injection 1,000 mcg  1,000 mcg Intramuscular Q28 days Marcy Panning, MD          Psychiatric Specialty Exam: Review of Systems  Constitutional: Negative for fever.  Cardiovascular: Negative for chest pain.  Gastrointestinal: Negative for nausea.  Musculoskeletal: Positive for back pain and joint pain.  Skin: Negative for rash.  Psychiatric/Behavioral: Negative for substance abuse and suicidal ideas.    Blood pressure 124/68, pulse 91, resp. rate 16, height 5\' 6"  (1.676 m), weight 230 lb (104.3 kg), last menstrual period 05/02/2006, SpO2 96 %.Body mass index is 37.12 kg/m.  General Appearance: Casual  Eye Contact:  Fair  Speech:  Slow  Volume:  Decreased  Mood:   Somewhat stressed due to pain  Affect:  congruent  Thought Process:  Goal Directed  Orientation:  Full (Time, Place, and Person)  Thought Content:  Rumination  Suicidal  Thoughts:  No  Homicidal Thoughts:  No  Memory:  Immediate;   Poor Recent;   Fair  Judgement:  Fair  Insight:  Shallow  Psychomotor Activity:  Decreased  Concentration:  Concentration: Fair and Attention Span: Fair  Recall:  [employer] of Knowledge:Fair  Language: Fair  Akathisia:  No  Handed:  Right  AIMS (if indicated):    Assets:  Desire for Improvement Resilience  ADL's:  Intact  Cognition: WNL  Sleep:  fair    Treatment Plan Summary: Medication management and Plan as follows  Mood disorder NOS: may be related with multiple medical concerns, pain , psychosocial issues.  Fluctuates. Continue lexapro. Also on neurontin  Depression: baseline. Continue lexapro  Refills sent GAD: worse at times. Continue lexapro and gabapentin  Reviewed side effects and sleep hygiene continued a medication follow-up in 2-3 months followed by the pain clinic as well for her concerns   Merian Capron, MD 3/22/201810:50 AM

## 2016-07-26 ENCOUNTER — Ambulatory Visit (HOSPITAL_BASED_OUTPATIENT_CLINIC_OR_DEPARTMENT_OTHER): Payer: Medicare HMO

## 2016-07-26 VITALS — BP 153/90 | HR 75 | Temp 98.9°F | Resp 20

## 2016-07-26 DIAGNOSIS — D510 Vitamin B12 deficiency anemia due to intrinsic factor deficiency: Secondary | ICD-10-CM

## 2016-07-26 MED ORDER — CYANOCOBALAMIN 1000 MCG/ML IJ SOLN
1000.0000 ug | Freq: Once | INTRAMUSCULAR | Status: AC
  Administered 2016-07-26: 1000 ug via INTRAMUSCULAR

## 2016-07-26 NOTE — Patient Instructions (Signed)

## 2016-08-23 ENCOUNTER — Ambulatory Visit (HOSPITAL_BASED_OUTPATIENT_CLINIC_OR_DEPARTMENT_OTHER): Payer: Medicare HMO

## 2016-08-23 VITALS — BP 170/88 | HR 82 | Temp 98.6°F | Resp 18

## 2016-08-23 DIAGNOSIS — D519 Vitamin B12 deficiency anemia, unspecified: Secondary | ICD-10-CM

## 2016-08-23 DIAGNOSIS — D510 Vitamin B12 deficiency anemia due to intrinsic factor deficiency: Secondary | ICD-10-CM

## 2016-08-23 MED ORDER — CYANOCOBALAMIN 1000 MCG/ML IJ SOLN
1000.0000 ug | Freq: Once | INTRAMUSCULAR | Status: AC
  Administered 2016-08-23: 1000 ug via INTRAMUSCULAR

## 2016-08-23 NOTE — Patient Instructions (Signed)

## 2016-09-20 ENCOUNTER — Ambulatory Visit (HOSPITAL_BASED_OUTPATIENT_CLINIC_OR_DEPARTMENT_OTHER): Payer: Medicare HMO

## 2016-09-20 VITALS — BP 158/88 | HR 72 | Temp 98.7°F | Resp 20

## 2016-09-20 DIAGNOSIS — D510 Vitamin B12 deficiency anemia due to intrinsic factor deficiency: Secondary | ICD-10-CM

## 2016-09-20 DIAGNOSIS — D519 Vitamin B12 deficiency anemia, unspecified: Secondary | ICD-10-CM

## 2016-09-20 MED ORDER — CYANOCOBALAMIN 1000 MCG/ML IJ SOLN
1000.0000 ug | Freq: Once | INTRAMUSCULAR | Status: AC
  Administered 2016-09-20: 1000 ug via INTRAMUSCULAR

## 2016-09-20 NOTE — Patient Instructions (Signed)

## 2016-10-11 ENCOUNTER — Telehealth: Payer: Self-pay | Admitting: Hematology

## 2016-10-11 NOTE — Telephone Encounter (Signed)
Lvm advising md appt moved from 6/19 to 7/18 @ 9.45am. Advised start time on 6/19 has not changed.

## 2016-10-13 ENCOUNTER — Ambulatory Visit (INDEPENDENT_AMBULATORY_CARE_PROVIDER_SITE_OTHER): Payer: Medicare HMO | Admitting: Psychiatry

## 2016-10-13 ENCOUNTER — Encounter (HOSPITAL_COMMUNITY): Payer: Self-pay | Admitting: Psychiatry

## 2016-10-13 VITALS — BP 120/76 | HR 84 | Resp 16 | Ht 66.0 in | Wt 230.0 lb

## 2016-10-13 DIAGNOSIS — Z818 Family history of other mental and behavioral disorders: Secondary | ICD-10-CM | POA: Diagnosis not present

## 2016-10-13 DIAGNOSIS — Z885 Allergy status to narcotic agent status: Secondary | ICD-10-CM | POA: Diagnosis not present

## 2016-10-13 DIAGNOSIS — Z791 Long term (current) use of non-steroidal anti-inflammatories (NSAID): Secondary | ICD-10-CM | POA: Diagnosis not present

## 2016-10-13 DIAGNOSIS — Z79899 Other long term (current) drug therapy: Secondary | ICD-10-CM

## 2016-10-13 DIAGNOSIS — F39 Unspecified mood [affective] disorder: Secondary | ICD-10-CM | POA: Diagnosis not present

## 2016-10-13 DIAGNOSIS — G894 Chronic pain syndrome: Secondary | ICD-10-CM

## 2016-10-13 DIAGNOSIS — Z7984 Long term (current) use of oral hypoglycemic drugs: Secondary | ICD-10-CM

## 2016-10-13 DIAGNOSIS — G8929 Other chronic pain: Secondary | ICD-10-CM

## 2016-10-13 DIAGNOSIS — Z7982 Long term (current) use of aspirin: Secondary | ICD-10-CM | POA: Diagnosis not present

## 2016-10-13 DIAGNOSIS — F329 Major depressive disorder, single episode, unspecified: Secondary | ICD-10-CM | POA: Diagnosis not present

## 2016-10-13 DIAGNOSIS — F411 Generalized anxiety disorder: Secondary | ICD-10-CM

## 2016-10-13 DIAGNOSIS — Z888 Allergy status to other drugs, medicaments and biological substances status: Secondary | ICD-10-CM | POA: Diagnosis not present

## 2016-10-13 DIAGNOSIS — M797 Fibromyalgia: Secondary | ICD-10-CM

## 2016-10-13 DIAGNOSIS — Z79891 Long term (current) use of opiate analgesic: Secondary | ICD-10-CM | POA: Diagnosis not present

## 2016-10-13 DIAGNOSIS — Z811 Family history of alcohol abuse and dependence: Secondary | ICD-10-CM | POA: Diagnosis not present

## 2016-10-13 DIAGNOSIS — Z9884 Bariatric surgery status: Secondary | ICD-10-CM

## 2016-10-13 DIAGNOSIS — F0630 Mood disorder due to known physiological condition, unspecified: Secondary | ICD-10-CM

## 2016-10-13 DIAGNOSIS — F3341 Major depressive disorder, recurrent, in partial remission: Secondary | ICD-10-CM

## 2016-10-13 DIAGNOSIS — M549 Dorsalgia, unspecified: Secondary | ICD-10-CM

## 2016-10-13 MED ORDER — ESCITALOPRAM OXALATE 20 MG PO TABS: 20.0000 mg | ORAL_TABLET | Freq: Every day | ORAL | 2 refills | Status: DC

## 2016-10-13 NOTE — Progress Notes (Signed)
[facility] Outpatient Follow up visit  Patient Identification: Kayla Mayo MRN:  [id number] Date of Evaluation:  10/13/2016 Referral Source: [name] and herself  Chief Complaint:   Chief Complaint    Follow-up     Visit Diagnosis:    ICD-10-CM   1. Major depressive disorder, recurrent episode, in partial remission ([place]) F33.41   2. Chronic pain syndrome G89.4   3. GAD (generalized anxiety disorder) F41.1   4. Mood disorder in conditions classified elsewhere F06.30     History of Present Illness:  61 years old currently married African-American female initially referred for management of depression and anxiety she suffers from chronic medical illnesses including fibromyalgia, chronic back pain and multiple surgeries history of gastric bypass.  Last visit we have increased her Lexapro to 15 mg. She did reasonable but continued to endorse depression and mood swings because of his chronic medical issues. 2 weeks ago she increased the Lexapro to 20 mg and that has helped she feels more motivated and makes plan to keep herself busy. Less crying spells tolerating it. She is expecting to have a hip surgery she has the support of her husband. Sleep at times disturbed but not taking trazadone On gabapentin and pain meds. Looking forward for surgery to not rely on too much meds  No reported side effects   Aggravating factor: multiple medical conditions.[pain Modifying factor: family, will power Severity of depression: 7.5/10    Past Medical History:  Past Medical History:  Diagnosis Date  . Anal fissure   . Anemia 03/17/2011  . Anxiety   . Arthritis   . Breast cancer ([place])   . Bursitis of hip   . Chronic headaches   . Colon polyps   . Depression   . Diabetes mellitus   . Esophageal spasm   . Gallstones   . GERD (gastroesophageal reflux disease)   . Hyperlipidemia   . Hypertension   . IBS (irritable bowel syndrome)   . Neuropathy   . Obesity   . Osteopenia 07/2011   t  score -1.5 FRAX 2.1%/0%  . Status post cardiac catheterization   . Vertigo    Occurs 6 times a month    Past Surgical History:  Procedure Laterality Date  . APPENDECTOMY    . bilareral knee scopes    . CARPAL TUNNEL RELEASE    . CHOLECYSTECTOMY    . GASTRIC BYPASS    . left shouler surgery    . lower back surgery    . TUBAL LIGATION      Family Psychiatric History: not aware or denies  Family History:  Family History  Problem Relation Age of Onset  . Diabetes Mother   . Stroke Mother   . Heart disease Mother   . Hypertension Mother   . Hyperlipidemia Mother   . Anxiety disorder Mother   . Diabetes Brother   . Alcohol abuse Brother   . Drug abuse Brother   . Alcohol abuse Father   . Cancer Father        Stomach  . Diabetes Maternal Aunt   . Diabetes Maternal Uncle   . Alcohol abuse Maternal Uncle   . Diabetes Maternal Grandmother   . Dementia Maternal Grandmother   . Depression Maternal Grandmother   . Alcohol abuse Paternal Uncle   . Cancer Paternal Uncle        Throat  . Alcohol abuse Maternal Grandfather   . Alcohol abuse Paternal Grandfather   . Alcohol abuse Maternal Uncle   .  Alcohol abuse Paternal Uncle   . Alcohol abuse Paternal Uncle   . Cancer Paternal Aunt        Leukemia    Social History:   Social History   Social History  . Marital status: Married    Spouse name: N/A  . Number of children: 2  . Years of education: 14   Occupational History  . Disabled Unemployed   Social History Main Topics  . Smoking status: Never Smoker  . Smokeless tobacco: Never Used  . Alcohol use No  . Drug use: No  . Sexual activity: Not Currently    Partners: Male    Birth control/ protection: Post-menopausal, Surgical     Comment: Tubal lig   Other Topics Concern  . None   Social History Narrative   Denies caffeine use      Allergies:   Allergies  Allergen Reactions  . Chicken Allergy     Sinus drainage  . Morphine And Related Itching  .  Codeine   . Corn-Containing Products   . Other     Statins  . Peanut-Containing Drug Products   . Sesame Oil     Sinus drainage  . Simvastatin     Muscle pain  . Wheat     Sinus drainage  . Lisinopril Cough    Metabolic Disorder Labs: Lab Results  Component Value Date   HGBA1C 6.6 (H) 03/23/2011   MPG 134 11/27/2008   No results found for: PROLACTIN Lab Results  Component Value Date   CHOL 183 03/23/2011   TRIG 69.0 03/23/2011   HDL 69.60 03/23/2011   CHOLHDL 3 03/23/2011   VLDL 13.8 03/23/2011   LDLCALC 100 (H) 03/23/2011   LDLCALC 113 (H) 08/24/2010     Current Medications: Current Outpatient Prescriptions  Medication Sig Dispense Refill  . ACCU-CHEK FASTCLIX LANCETS MISC Use daily as directed for Dx: 250.00    . aspirin 81 MG tablet Take 81 mg by mouth daily.    . clobetasol ointment (TEMOVATE) 0.05 % 2 (two) times daily. On scalp bid  2  . cyanocobalamin (,VITAMIN B-12,) 1000 MCG/ML injection Inject 1,000 mcg into the muscle every 30 (thirty) days.      . cyclobenzaprine (FLEXERIL) 10 MG tablet Take 10 mg by mouth 3 (three) times daily as needed.    . diclofenac sodium (VOLTAREN) 1 % GEL APPLY 2g 4 times a day applied topically 30 day(s)    . EPIPEN 2-PAK 0.3 MG/0.3ML DEVI as needed. Reported on 10/12/2015    . escitalopram (LEXAPRO) 20 MG tablet Take 1 tablet (20 mg total) by mouth daily. one a day. 30 tablet 2  . fluticasone (FLONASE) 50 MCG/ACT nasal spray Place 2 sprays into both nostrils 2 (two) times daily.    . furosemide (LASIX) 20 MG tablet Take 1 tablet (20 mg total) by mouth 2 (two) times daily. 180 tablet 3  . gabapentin (NEURONTIN) 300 MG capsule Take 600 mg by mouth 2 (two) times daily.     Marland Kitchen HYDROcodone-acetaminophen (NORCO) 7.5-325 MG tablet Take 1 tablet by mouth 3 (three) times daily as needed for moderate pain.    . hydrocortisone (PROCTOSOL HC) 2.5 % rectal cream Place 1 application rectally as needed. 30 g 0  . levothyroxine (SYNTHROID,  LEVOTHROID) 25 MCG tablet Take 25 mcg by mouth.    . Linaclotide (LINZESS) 290 MCG CAPS capsule Take 1 capsule by mouth daily.    . meclizine (ANTIVERT) 25 MG tablet Take 25 mg by  mouth 3 (three) times daily as needed.      . metFORMIN (GLUCOPHAGE) 850 MG tablet Take 850 mg by mouth 2 (two) times daily with a meal.    . naproxen (NAPROSYN) 500 MG tablet Take 1 tablet (500 mg total) by mouth 2 (two) times daily with a meal. 20 tablet 0  . pantoprazole (PROTONIX) 20 MG tablet Take 40 mg by mouth daily.     . polyethylene glycol powder (GLYCOLAX/MIRALAX) powder Take 17 g by mouth 2 (two) times daily. 255 g 0  . rosuvastatin (CRESTOR) 10 MG tablet Take 10 mg by mouth daily.     . valsartan (DIOVAN) 80 MG tablet   5  . verapamil (CALAN) 80 MG tablet Take 1 tablet (80 mg total) by mouth daily. 90 tablet 3   No current facility-administered medications for this visit.    Facility-Administered Medications Ordered in Other Visits  Medication Dose Route Frequency Provider Last Rate Last Dose  . cyanocobalamin ((VITAMIN B-12)) injection 1,000 mcg  1,000 mcg Intramuscular Q28 days Marcy Panning, MD          Psychiatric Specialty Exam: Review of Systems  Constitutional: Negative for fever.  Cardiovascular: Negative for palpitations.  Gastrointestinal: Negative for nausea.  Musculoskeletal: Positive for back pain and joint pain.  Skin: Negative for rash.  Psychiatric/Behavioral: Negative for substance abuse and suicidal ideas.    Blood pressure 120/76, pulse 84, resp. rate 16, height 5\' 6"  (1.676 m), weight 230 lb (104.3 kg), last menstrual period 05/02/2006, SpO2 96 %.Body mass index is 37.12 kg/m.  General Appearance: Casual  Eye Contact:  Fair  Speech:  Slow  Volume:  Decreased  Mood:   fair  Affect:  Congruent and somewhat improved  Thought Process:  Goal Directed  Orientation:  Full (Time, Place, and Person)  Thought Content:  Rumination  Suicidal Thoughts:  No  Homicidal Thoughts:   No  Memory:  Immediate;   Poor Recent;   Fair  Judgement:  Fair  Insight:  Shallow  Psychomotor Activity:  Decreased  Concentration:  Concentration: Fair and Attention Span: Fair  Recall:  [employer] of Knowledge:Fair  Language: Fair  Akathisia:  No  Handed:  Right  AIMS (if indicated):    Assets:  Desire for Improvement Resilience  ADL's:  Intact  Cognition: WNL  Sleep:  fair    Treatment Plan Summary: Medication management and Plan as follows  Mood disorder NOS: somewhat better on lexapro 20mg . Still endorses pain effects the mood  Depression: less depressed. Continue lexapro now 20mg . Sent refills   GAD: fluctuates. Provided suportive therapy continue lexapro 20mg .   Reviewed meds, questions addressed FU 3 months. Continue to keep morale and support   Merian Capron, MD 6/14/201810:17 AM

## 2016-10-18 ENCOUNTER — Ambulatory Visit: Payer: Self-pay | Admitting: Hematology

## 2016-10-18 ENCOUNTER — Other Ambulatory Visit: Payer: Self-pay

## 2016-10-18 ENCOUNTER — Ambulatory Visit: Payer: Self-pay

## 2016-11-11 NOTE — Progress Notes (Signed)
[facility] PROGRESS NOTE DATE OF SERVICE: 11/16/2016    Kayla Messick, MD [address] [place] [address]  DIAGNOSIS: 60 y.o. 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   #2 She had iron infusions in past for a low Ferritin, last Feraheme infusion was 04/22/2015  CURRENT THERAPY: B12 injections every month and iron infuion prn if ferritin less than 100   INTERVAL HISTORY:  Kayla Mayo 61 y.o. female returns for followup visit today. She presents to the clinic today with her family member. She reports to having hip surgery 10/19/16 on her left side. She is still in pain which she is taking hydrocodone and she has not started PT yet. She can walk well with her walker. She missed her B12 injection due to surgery.     MEDICAL HISTORY: Past Medical History:  Diagnosis Date  . Anal fissure   . Anemia 03/17/2011  . Anxiety   . Arthritis   . Breast cancer ([place])   . Bursitis of hip   . Chronic headaches   . Colon polyps   . Depression   . Diabetes mellitus   . Esophageal spasm   . Gallstones   . GERD (gastroesophageal reflux disease)   . Hyperlipidemia   . Hypertension   . IBS (irritable bowel syndrome)   . Neuropathy   . Obesity   . Osteopenia 07/2011   t score -1.5 FRAX 2.1%/0%  . Status post cardiac catheterization   . Vertigo    Occurs 6 times a month    ALLERGIES:  is allergic to chicken allergy; anesthetics, amide; morphine and related; codeine; corn-containing products; other; peanut-containing drug products; sesame oil; simvastatin; wheat; and lisinopril.  MEDICATIONS:  Current Outpatient Prescriptions  Medication Sig Dispense Refill  . ACCU-CHEK FASTCLIX LANCETS MISC Use daily as directed for Dx: 250.00    . aspirin 81 MG tablet Take 81 mg by mouth daily.    . clobetasol ointment (TEMOVATE)  0.05 % 2 (two) times daily. On scalp bid  2  . cyanocobalamin (,VITAMIN B-12,) 1000 MCG/ML injection Inject 1,000 mcg into the muscle every 30 (thirty) days.      . cyclobenzaprine (FLEXERIL) 10 MG tablet Take 10 mg by mouth 3 (three) times daily as needed.    . diclofenac sodium (VOLTAREN) 1 % GEL APPLY 2g 4 times a day applied topically 30 day(s)    . EPIPEN 2-PAK 0.3 MG/0.3ML DEVI as needed. Reported on 10/12/2015    . escitalopram (LEXAPRO) 20 MG tablet Take 1 tablet (20 mg total) by mouth daily. one a day. 30 tablet 2  . fluticasone (FLONASE) 50 MCG/ACT nasal spray Place 2 sprays into both nostrils 2 (two) times daily.    . furosemide (LASIX) 20 MG tablet Take 1 tablet (20 mg total) by mouth 2 (two) times daily. 180 tablet 3  . gabapentin (NEURONTIN) 300 MG capsule Take 600 mg by mouth 2 (two) times daily.     Marland Kitchen HYDROcodone-acetaminophen (NORCO) 7.5-325 MG tablet Take 1 tablet by mouth 3 (three) times daily as needed for moderate pain.    . hydrocortisone (PROCTOSOL HC) 2.5 % rectal cream Place 1 application rectally as needed. 30 g 0  . levothyroxine (SYNTHROID, LEVOTHROID) 25 MCG tablet Take 25 mcg by mouth.    . Linaclotide (LINZESS) 290 MCG CAPS capsule Take 1 capsule by mouth daily.    Marland Kitchen  meclizine (ANTIVERT) 25 MG tablet Take 25 mg by mouth 3 (three) times daily as needed.      . metFORMIN (GLUCOPHAGE) 850 MG tablet Take 850 mg by mouth 2 (two) times daily with a meal.    . naproxen (NAPROSYN) 500 MG tablet Take 1 tablet (500 mg total) by mouth 2 (two) times daily with a meal. 20 tablet 0  . pantoprazole (PROTONIX) 20 MG tablet Take 40 mg by mouth daily.     . polyethylene glycol powder (GLYCOLAX/MIRALAX) powder Take 17 g by mouth 2 (two) times daily. 255 g 0  . rosuvastatin (CRESTOR) 10 MG tablet Take 10 mg by mouth daily.     . valsartan (DIOVAN) 80 MG tablet   5  . verapamil (CALAN) 80 MG tablet Take 1 tablet (80 mg total) by mouth daily. 90 tablet 3   No current  facility-administered medications for this visit.    Facility-Administered Medications Ordered in Other Visits  Medication Dose Route Frequency Provider Last Rate Last Dose  . cyanocobalamin ((VITAMIN B-12)) injection 1,000 mcg  1,000 mcg Intramuscular Q28 days Marcy Panning, MD        SURGICAL HISTORY:  Past Surgical History:  Procedure Laterality Date  . APPENDECTOMY    . bilareral knee scopes    . CARPAL TUNNEL RELEASE    . CHOLECYSTECTOMY    . GASTRIC BYPASS    . left shouler surgery    . lower back surgery    . TUBAL LIGATION      REVIEW OF SYSTEMS:  Pertinent items are noted in HPI.   PHYSICAL EXAMINATION:  Vitals:   11/16/16 1053  BP: (!) 155/89  Pulse: 60  Resp: 16  Temp: 98 F (36.7 C)  TempSrc: Oral  SpO2: 100%  Weight: 236 lb (107 kg)  Height: 5\' 6"  (1.676 m)      General appearance: alert, cooperative and appears stated age Resp: clear to auscultation bilaterally and normal percussion bilaterally Back: symmetric, no curvature. ROM normal. No CVA tenderness. Cardio: regular rate and rhythm, S1, S2 normal, no murmur, click, rub or gallop GI: soft, non-tender; bowel sounds normal; no masses,  no organomegaly Extremities: extremities normal, atraumatic, no cyanosis or edema Neurologic: Alert and oriented X 3, normal strength and tone. Normal symmetric reflexes. Normal coordination and gait  ECOG PERFORMANCE STATUS: 1 - Symptomatic but completely ambulatory  LABORATORY DATA: CBC Latest Ref Rng & Units 11/16/2016 06/28/2016 04/05/2016  WBC 3.9 - 10.3 10e3/uL 4.8 5.2 4.3  Hemoglobin 11.6 - 15.9 g/dL 12.6 12.2 11.3(L)  Hematocrit 34.8 - 46.6 % 39.1 37.8 35.2  Platelets 145 - 400 10e3/uL 203 225 195    CMP Latest Ref Rng & Units 11/16/2016 06/28/2016 04/05/2016  Glucose 70 - 140 mg/dl 180(H) 164(H) 133  BUN 7.0 - 26.0 mg/dL 10.1 13.1 12.5  Creatinine 0.6 - 1.1 mg/dL 0.8 0.8 0.9  Sodium 136 - 145 mEq/L 142 142 141  Potassium 3.5 - 5.1 mEq/L 3.8 4.5 5.0   Chloride 96 - 112 mEq/L - - -  CO2 22 - 29 mEq/L 28 31(H) 29  Calcium 8.4 - 10.4 mg/dL 9.1 9.5 9.1  Total Protein 6.4 - 8.3 g/dL 6.8 6.9 6.6  Total Bilirubin 0.20 - 1.20 mg/dL 0.70 0.68 0.53  Alkaline Phos 40 - 150 U/L 99 86 83  AST 5 - 34 U/L 18 21 22   ALT 0 - 55 U/L 18 19 19     Results for JOYICE, MAGDA (MRN [id number]) as of  11/16/2016 10:53  Ref. Range 04/05/2016 10:23 06/28/2016 10:17 11/16/2016 09:19  Iron Latest Ref Range: 41 - 142 ug/dL 92 137 98  UIBC Latest Ref Range: 120 - 384 ug/dL 162 133 158  TIBC Latest Ref Range: 236 - 444 ug/dL 254 270 256  %SAT Latest Ref Range: 21 - 57 % 36 51 38  Ferritin Latest Ref Range: 9 - 269 ng/ml 163 184 146      Results for ELEISHA, BRANSCOMB (MRN [id number]) as of 11/11/2016 11:25  Ref. Range 03/17/2014 11:20 07/20/2015 10:48 01/12/2016 10:18  Vitamin B12 Latest Ref Range: 211 - 946 pg/mL 632 874 608      RADIOGRAPHIC STUDIES:  Mammogram 02/12/2016: IMPRESSION: No mammographic evidence of malignancy. A result letter of this screening mammogram will be mailed directly to the patient.  RECOMMENDATION: Screening mammogram in one year. (Code:SM-B-01Y) .  ASSESSMENT:   61 y.o.  female s/p gastric bypass surgery  1. Anemia secondary to B12 deficiency and iron deficiency, secondary to gastric bypass surgery -Continue monthly B12 injection, her last B12 level has been normal  -She is doing well, hemoglobin 12.6 today, repeat iron level was in good normal range. -We'll continue to monitor her CBC and iron studies every 3-4 months -She has not required IV iron much, last infusion in 04/2015  2. Arthritis, DM,  fatigue, insomnia -She'll continue follow-up with her primary care physician -She will also follow-up with her pain specialist for her arthralgia and pain management   3. She will continue follow-up with her primary care physician for other medical problems.  Plan -Monthly B12 injection X8 -Lab in 3 and 7 months -F/u in 8  months   This document serves as a record of services personally performed by Truitt Merle, MD. It was created on her behalf by Joslyn Devon, a trained medical scribe. The creation of this record is based on the scribe's personal observations and the provider's statements to them. This document has been checked and approved by the attending provider.    Truitt Merle  11/16/2016,

## 2016-11-16 ENCOUNTER — Ambulatory Visit (HOSPITAL_BASED_OUTPATIENT_CLINIC_OR_DEPARTMENT_OTHER): Payer: Medicare HMO | Admitting: Hematology

## 2016-11-16 ENCOUNTER — Encounter: Payer: Self-pay | Admitting: Hematology

## 2016-11-16 ENCOUNTER — Other Ambulatory Visit (HOSPITAL_BASED_OUTPATIENT_CLINIC_OR_DEPARTMENT_OTHER): Payer: Medicare HMO

## 2016-11-16 ENCOUNTER — Ambulatory Visit (HOSPITAL_BASED_OUTPATIENT_CLINIC_OR_DEPARTMENT_OTHER): Payer: Medicare HMO

## 2016-11-16 ENCOUNTER — Telehealth: Payer: Self-pay | Admitting: Hematology

## 2016-11-16 VITALS — BP 155/89 | HR 60 | Temp 98.0°F | Resp 16 | Ht 66.0 in | Wt 236.0 lb

## 2016-11-16 DIAGNOSIS — M1990 Unspecified osteoarthritis, unspecified site: Secondary | ICD-10-CM

## 2016-11-16 DIAGNOSIS — D6489 Other specified anemias: Secondary | ICD-10-CM

## 2016-11-16 DIAGNOSIS — E119 Type 2 diabetes mellitus without complications: Secondary | ICD-10-CM

## 2016-11-16 DIAGNOSIS — Z9484 Stem cells transplant status: Secondary | ICD-10-CM | POA: Diagnosis not present

## 2016-11-16 DIAGNOSIS — D509 Iron deficiency anemia, unspecified: Secondary | ICD-10-CM

## 2016-11-16 DIAGNOSIS — D518 Other vitamin B12 deficiency anemias: Secondary | ICD-10-CM

## 2016-11-16 DIAGNOSIS — D519 Vitamin B12 deficiency anemia, unspecified: Secondary | ICD-10-CM

## 2016-11-16 DIAGNOSIS — G4700 Insomnia, unspecified: Secondary | ICD-10-CM

## 2016-11-16 DIAGNOSIS — R5383 Other fatigue: Secondary | ICD-10-CM | POA: Diagnosis not present

## 2016-11-16 DIAGNOSIS — D510 Vitamin B12 deficiency anemia due to intrinsic factor deficiency: Secondary | ICD-10-CM

## 2016-11-16 LAB — CBC & DIFF AND RETIC
BASO%: 0.6 % (ref 0.0–2.0)
Basophils Absolute: 0 10*3/uL (ref 0.0–0.1)
EOS%: 5.2 % (ref 0.0–7.0)
Eosinophils Absolute: 0.3 10*3/uL (ref 0.0–0.5)
HCT: 39.1 % (ref 34.8–46.6)
HGB: 12.6 g/dL (ref 11.6–15.9)
Immature Retic Fract: 5.8 % (ref 1.60–10.00)
LYMPH%: 37.5 % (ref 14.0–49.7)
MCH: 30.9 pg (ref 25.1–34.0)
MCHC: 32.2 g/dL (ref 31.5–36.0)
MCV: 95.8 fL (ref 79.5–101.0)
MONO#: 0.3 10*3/uL (ref 0.1–0.9)
MONO%: 6.9 % (ref 0.0–14.0)
NEUT%: 49.8 % (ref 38.4–76.8)
NEUTROS ABS: 2.4 10*3/uL (ref 1.5–6.5)
Platelets: 203 10*3/uL (ref 145–400)
RBC: 4.08 10*6/uL (ref 3.70–5.45)
RDW: 13.2 % (ref 11.2–14.5)
Retic %: 2.26 % — ABNORMAL HIGH (ref 0.70–2.10)
Retic Ct Abs: 92.21 10*3/uL — ABNORMAL HIGH (ref 33.70–90.70)
WBC: 4.8 10*3/uL (ref 3.9–10.3)
lymph#: 1.8 10*3/uL (ref 0.9–3.3)

## 2016-11-16 LAB — IRON AND TIBC
%SAT: 38 % (ref 21–57)
Iron: 98 ug/dL (ref 41–142)
TIBC: 256 ug/dL (ref 236–444)
UIBC: 158 ug/dL (ref 120–384)

## 2016-11-16 LAB — COMPREHENSIVE METABOLIC PANEL
ALT: 18 U/L (ref 0–55)
AST: 18 U/L (ref 5–34)
Albumin: 3.8 g/dL (ref 3.5–5.0)
Alkaline Phosphatase: 99 U/L (ref 40–150)
Anion Gap: 11 mEq/L (ref 3–11)
BUN: 10.1 mg/dL (ref 7.0–26.0)
CO2: 28 meq/L (ref 22–29)
Calcium: 9.1 mg/dL (ref 8.4–10.4)
Chloride: 103 mEq/L (ref 98–109)
Creatinine: 0.8 mg/dL (ref 0.6–1.1)
GLUCOSE: 180 mg/dL — AB (ref 70–140)
POTASSIUM: 3.8 meq/L (ref 3.5–5.1)
SODIUM: 142 meq/L (ref 136–145)
Total Bilirubin: 0.7 mg/dL (ref 0.20–1.20)
Total Protein: 6.8 g/dL (ref 6.4–8.3)

## 2016-11-16 LAB — FERRITIN: Ferritin: 146 ng/ml (ref 9–269)

## 2016-11-16 MED ORDER — CYANOCOBALAMIN 1000 MCG/ML IJ SOLN
1000.0000 ug | Freq: Once | INTRAMUSCULAR | Status: AC
  Administered 2016-11-16: 1000 ug via INTRAMUSCULAR

## 2016-11-16 NOTE — Telephone Encounter (Signed)
Scheduled appt per 7/18 los - Gave patient AVS and calender per los.  

## 2016-11-16 NOTE — Patient Instructions (Signed)

## 2016-12-16 ENCOUNTER — Ambulatory Visit (HOSPITAL_BASED_OUTPATIENT_CLINIC_OR_DEPARTMENT_OTHER): Payer: Medicare HMO

## 2016-12-16 DIAGNOSIS — D519 Vitamin B12 deficiency anemia, unspecified: Secondary | ICD-10-CM

## 2016-12-16 DIAGNOSIS — D510 Vitamin B12 deficiency anemia due to intrinsic factor deficiency: Secondary | ICD-10-CM

## 2016-12-16 MED ORDER — CYANOCOBALAMIN 1000 MCG/ML IJ SOLN
1000.0000 ug | Freq: Once | INTRAMUSCULAR | Status: AC
  Administered 2016-12-16: 1000 ug via INTRAMUSCULAR

## 2016-12-16 MED ORDER — CYANOCOBALAMIN 1000 MCG/ML IJ SOLN
INTRAMUSCULAR | Status: AC
  Filled 2016-12-16: qty 1

## 2017-01-05 ENCOUNTER — Ambulatory Visit (INDEPENDENT_AMBULATORY_CARE_PROVIDER_SITE_OTHER): Payer: Medicare HMO | Admitting: Psychiatry

## 2017-01-05 ENCOUNTER — Encounter (HOSPITAL_COMMUNITY): Payer: Self-pay | Admitting: Psychiatry

## 2017-01-05 VITALS — BP 126/80 | HR 78 | Resp 16 | Ht 66.0 in | Wt 232.0 lb

## 2017-01-05 DIAGNOSIS — F3341 Major depressive disorder, recurrent, in partial remission: Secondary | ICD-10-CM | POA: Diagnosis not present

## 2017-01-05 DIAGNOSIS — Z811 Family history of alcohol abuse and dependence: Secondary | ICD-10-CM | POA: Diagnosis not present

## 2017-01-05 DIAGNOSIS — M549 Dorsalgia, unspecified: Secondary | ICD-10-CM | POA: Diagnosis not present

## 2017-01-05 DIAGNOSIS — Z810 Family history of intellectual disabilities: Secondary | ICD-10-CM | POA: Diagnosis not present

## 2017-01-05 DIAGNOSIS — F0630 Mood disorder due to known physiological condition, unspecified: Secondary | ICD-10-CM | POA: Diagnosis not present

## 2017-01-05 DIAGNOSIS — F411 Generalized anxiety disorder: Secondary | ICD-10-CM

## 2017-01-05 DIAGNOSIS — Z818 Family history of other mental and behavioral disorders: Secondary | ICD-10-CM | POA: Diagnosis not present

## 2017-01-05 DIAGNOSIS — Z560 Unemployment, unspecified: Secondary | ICD-10-CM | POA: Diagnosis not present

## 2017-01-05 DIAGNOSIS — F5102 Adjustment insomnia: Secondary | ICD-10-CM | POA: Diagnosis not present

## 2017-01-05 DIAGNOSIS — G894 Chronic pain syndrome: Secondary | ICD-10-CM | POA: Diagnosis not present

## 2017-01-05 DIAGNOSIS — Z813 Family history of other psychoactive substance abuse and dependence: Secondary | ICD-10-CM | POA: Diagnosis not present

## 2017-01-05 DIAGNOSIS — M2550 Pain in unspecified joint: Secondary | ICD-10-CM

## 2017-01-05 MED ORDER — ESCITALOPRAM OXALATE 20 MG PO TABS: 20.0000 mg | ORAL_TABLET | Freq: Every day | ORAL | 2 refills | Status: DC

## 2017-01-05 NOTE — Progress Notes (Signed)
[facility] Outpatient Follow up visit  Patient Identification: Kayla Mayo MRN:  [id number] Date of Evaluation:  01/05/2017 Referral Source: [name] and herself  Chief Complaint:   Chief Complaint    Follow-up     Visit Diagnosis:    ICD-10-CM   1. 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 Chronic pain syndrome G89.4   3. GAD (generalized anxiety disorder) F41.1   4. Mood disorder in conditions classified elsewhere F06.30   5. Adjustment insomnia F51.02     History of Present Illness:  61 years old currently married African-American female initially referred for management of depression and anxiety she suffers from chronic medical illnesses including fibromyalgia, chronic back pain and multiple surgeries history of gastric bypass.  Lexapro 20 mg was helping both recently she has had some deaths in the family and also some worried about diabetes. Chronic pain also affects her mood. She is feeling somewhat down but believes Lexapro was helping there is no reported side effects  On gabapentin and pain meds. Looking forward for surgery to not rely on too much meds  No reported side effects   Aggravating factor: medical conditions. pain Modifying factor: family,  [place] power Severity of depression: 7/10    Past Medical History:  Past Medical History:  Diagnosis Date  . Anal fissure   . Anemia 03/17/2011  . Anxiety   . Arthritis   . Breast cancer ([place])   . Bursitis of hip   . Chronic headaches   . Colon polyps   . Depression   . Diabetes mellitus   . Esophageal spasm   . Gallstones   . GERD (gastroesophageal reflux disease)   . Hyperlipidemia   . Hypertension   . IBS (irritable bowel syndrome)   . Neuropathy   . Obesity   . Osteopenia 07/2011   t score -1.5 FRAX 2.1%/0%  . Status post cardiac catheterization   . Vertigo    Occurs 6 times a month    Past Surgical History:  Procedure Laterality Date  . APPENDECTOMY     . bilareral knee scopes    . CARPAL TUNNEL RELEASE    . CHOLECYSTECTOMY    . GASTRIC BYPASS    . left shouler surgery    . lower back surgery    . TUBAL LIGATION      Family Psychiatric History: not aware or denies  Family History:  Family History  Problem Relation Age of Onset  . Diabetes Mother   . Stroke Mother   . Heart disease Mother   . Hypertension Mother   . Hyperlipidemia Mother   . Anxiety disorder Mother   . Diabetes Brother   . Alcohol abuse Brother   . Drug abuse Brother   . Alcohol abuse Father   . Cancer Father        Stomach  . Diabetes Maternal Aunt   . Diabetes Maternal Uncle   . Alcohol abuse Maternal Uncle   . Diabetes Maternal Grandmother   . Dementia Maternal Grandmother   . Depression Maternal Grandmother   . Alcohol abuse Paternal Uncle   . Cancer Paternal Uncle        Throat  . Alcohol abuse Maternal Grandfather   . Alcohol abuse Paternal Grandfather   . Alcohol abuse Maternal Uncle   . Alcohol abuse Paternal Uncle   . Alcohol abuse Paternal Uncle   . Cancer Paternal Aunt        Leukemia  Social History:   Social History   Social History  . Marital status: Married    Spouse name: N/A  . Number of children: 2  . Years of education: 14   Occupational History  . Disabled Unemployed   Social History Main Topics  . Smoking status: Never Smoker  . Smokeless tobacco: Never Used  . Alcohol use No  . Drug use: No  . Sexual activity: Not Currently    Partners: Male    Birth control/ protection: Post-menopausal, Surgical     Comment: Tubal lig   Other Topics Concern  . None   Social History Narrative   Denies caffeine use      Allergies:   Allergies  Allergen Reactions  . Chicken Allergy     Sinus drainage  . Anesthetics, Amide Nausea And Vomiting  . Morphine And Related Itching  . Codeine   . Corn-Containing Products   . Other     Statins  . Peanut-Containing Drug Products   . Sesame Oil     Sinus drainage  .  Simvastatin     Muscle pain  . Wheat     Sinus drainage  . Lisinopril Cough    Metabolic Disorder Labs: Lab Results  Component Value Date   HGBA1C 6.6 (H) 03/23/2011   MPG 134 11/27/2008   No results found for: PROLACTIN Lab Results  Component Value Date   CHOL 183 03/23/2011   TRIG 69.0 03/23/2011   HDL 69.60 03/23/2011   CHOLHDL 3 03/23/2011   VLDL 13.8 03/23/2011   LDLCALC 100 (H) 03/23/2011   LDLCALC 113 (H) 08/24/2010     Current Medications: Current Outpatient Prescriptions  Medication Sig Dispense Refill  . ACCU-CHEK FASTCLIX LANCETS MISC Use daily as directed for Dx: 250.00    . aspirin 81 MG tablet Take 81 mg by mouth daily.    . clobetasol ointment (TEMOVATE) 0.05 % 2 (two) times daily. On scalp bid  2  . cyanocobalamin (,VITAMIN B-12,) 1000 MCG/ML injection Inject 1,000 mcg into the muscle every 30 (thirty) days.      . cyclobenzaprine (FLEXERIL) 10 MG tablet Take 10 mg by mouth 3 (three) times daily as needed.    . diclofenac sodium (VOLTAREN) 1 % GEL APPLY 2g 4 times a day applied topically 30 day(s)    . EPIPEN 2-PAK 0.3 MG/0.3ML DEVI as needed. Reported on 10/12/2015    . escitalopram (LEXAPRO) 20 MG tablet Take 1 tablet (20 mg total) by mouth daily. one a day. 30 tablet 2  . fluticasone (FLONASE) 50 MCG/ACT nasal spray Place 2 sprays into both nostrils 2 (two) times daily.    . furosemide (LASIX) 20 MG tablet Take 1 tablet (20 mg total) by mouth 2 (two) times daily. 180 tablet 3  . gabapentin (NEURONTIN) 300 MG capsule Take 600 mg by mouth 2 (two) times daily.     Marland Kitchen HYDROcodone-acetaminophen (NORCO) 7.5-325 MG tablet Take 1 tablet by mouth 3 (three) times daily as needed for moderate pain.    . hydrocortisone (PROCTOSOL HC) 2.5 % rectal cream Place 1 application rectally as needed. 30 g 0  . levothyroxine (SYNTHROID, LEVOTHROID) 25 MCG tablet Take 25 mcg by mouth.    . Linaclotide (LINZESS) 290 MCG CAPS capsule Take 1 capsule by mouth daily.    . meclizine  (ANTIVERT) 25 MG tablet Take 25 mg by mouth 3 (three) times daily as needed.      . metFORMIN (GLUCOPHAGE) 850 MG tablet Take 850 mg  by mouth 2 (two) times daily with a meal.    . naproxen (NAPROSYN) 500 MG tablet Take 1 tablet (500 mg total) by mouth 2 (two) times daily with a meal. 20 tablet 0  . pantoprazole (PROTONIX) 20 MG tablet Take 40 mg by mouth daily.     . polyethylene glycol powder (GLYCOLAX/MIRALAX) powder Take 17 g by mouth 2 (two) times daily. 255 g 0  . rosuvastatin (CRESTOR) 10 MG tablet Take 10 mg by mouth daily.     . valsartan (DIOVAN) 80 MG tablet   5  . verapamil (CALAN) 80 MG tablet Take 1 tablet (80 mg total) by mouth daily. 90 tablet 3   No current facility-administered medications for this visit.    Facility-Administered Medications Ordered in Other Visits  Medication Dose Route Frequency Provider Last Rate Last Dose  . cyanocobalamin ((VITAMIN B-12)) injection 1,000 mcg  1,000 mcg Intramuscular Q28 days Marcy Panning, MD          Psychiatric Specialty Exam: Review of Systems  Constitutional: Negative for fever.  Cardiovascular: Negative for chest pain.  Gastrointestinal: Negative for nausea.  Musculoskeletal: Positive for back pain and joint pain.  Skin: Negative for rash.  Psychiatric/Behavioral: Positive for depression. Negative for substance abuse and suicidal ideas.    Blood pressure 126/80, pulse 78, resp. rate 16, height 5\' 6"  (1.676 m), weight 232 lb (105.2 kg), last menstrual period 05/02/2006, SpO2 97 %.Body mass index is 37.45 kg/m.  General Appearance: Casual  Eye Contact:  Fair  Speech:  Slow  Volume:  Decreased  Mood:   Somewhat dysphoric  Affect:  congruent  Thought Process:  Goal Directed  Orientation:  Full (Time, Place, and Person)  Thought Content:  Rumination  Suicidal Thoughts:  No  Homicidal Thoughts:  No  Memory:  Immediate;   Poor Recent;   Fair  Judgement:  Fair  Insight:  Shallow  Psychomotor Activity:  Decreased   Concentration:  Concentration: Fair and Attention Span: Fair  Recall:  [employer] of Knowledge:Fair  Language: Fair  Akathisia:  No  Handed:  Right  AIMS (if indicated):    Assets:  Desire for Improvement Resilience  ADL's:  Intact  Cognition: WNL  Sleep:  fair    Treatment Plan Summary: Medication management and Plan as follows  Mood disorder NOS: fluctuates. Will continue 20mg  lexapro. Provided supportive therapy  Depression: somewhat down due to grief. Provided support therapy. Continue lexapro  GAD: remains due to medical conditions. Discussed distraction. Continue lexapro  Reviewed meds, questions addressed FU 3 months  De Nurse, Maitri Schnoebelen, MD 9/6/201810:17 AM

## 2017-01-10 ENCOUNTER — Other Ambulatory Visit: Payer: Self-pay | Admitting: Internal Medicine

## 2017-01-10 DIAGNOSIS — Z1231 Encounter for screening mammogram for malignant neoplasm of breast: Secondary | ICD-10-CM

## 2017-01-16 ENCOUNTER — Ambulatory Visit (HOSPITAL_BASED_OUTPATIENT_CLINIC_OR_DEPARTMENT_OTHER): Payer: Medicare HMO

## 2017-01-16 VITALS — BP 143/78 | HR 64 | Temp 97.7°F | Resp 20

## 2017-01-16 DIAGNOSIS — D519 Vitamin B12 deficiency anemia, unspecified: Secondary | ICD-10-CM

## 2017-01-16 DIAGNOSIS — D510 Vitamin B12 deficiency anemia due to intrinsic factor deficiency: Secondary | ICD-10-CM

## 2017-01-16 MED ORDER — CYANOCOBALAMIN 1000 MCG/ML IJ SOLN
1000.0000 ug | Freq: Once | INTRAMUSCULAR | Status: AC
  Administered 2017-01-16: 1000 ug via INTRAMUSCULAR

## 2017-01-16 NOTE — Patient Instructions (Signed)
Cyanocobalamin, Vitamin B12 injection What is this medicine? CYANOCOBALAMIN (sye an oh koe BAL a min) is a man made form of vitamin B12. Vitamin B12 is used in the growth of healthy blood cells, nerve cells, and proteins in the body. It also helps with the metabolism of fats and carbohydrates. This medicine is used to treat people who can not absorb vitamin B12. This medicine may be used for other purposes; ask your health care provider or pharmacist if you have questions. COMMON BRAND NAME(S): B-12 Compliance Kit, B-12 Injection Kit, Cyomin, LA-12, Nutri-Twelve, Physicians EZ Use B-12, Primabalt What should I tell my health care provider before I take this medicine? They need to know if you have any of these conditions: -kidney disease -Leber's disease -megaloblastic anemia -an unusual or allergic reaction to cyanocobalamin, cobalt, other medicines, foods, dyes, or preservatives -pregnant or trying to get pregnant -breast-feeding How should I use this medicine? This medicine is injected into a muscle or deeply under the skin. It is usually given by a health care professional in a clinic or doctor's office. However, your doctor may teach you how to inject yourself. Follow all instructions. Talk to your pediatrician regarding the use of this medicine in children. Special care may be needed. Overdosage: If you think you have taken too much of this medicine contact a poison control center or emergency room at once. NOTE: This medicine is only for you. Do not share this medicine with others. What if I miss a dose? If you are given your dose at a clinic or doctor's office, call to reschedule your appointment. If you give your own injections and you miss a dose, take it as soon as you can. If it is almost time for your next dose, take only that dose. Do not take double or extra doses. What may interact with this medicine? -colchicine -heavy alcohol intake This list may not describe all possible  interactions. Give your health care provider a list of all the medicines, herbs, non-prescription drugs, or dietary supplements you use. Also tell them if you smoke, drink alcohol, or use illegal drugs. Some items may interact with your medicine. What should I watch for while using this medicine? Visit your doctor or health care professional regularly. You may need blood work done while you are taking this medicine. You may need to follow a special diet. Talk to your doctor. Limit your alcohol intake and avoid smoking to get the best benefit. What side effects may I notice from receiving this medicine? Side effects that you should report to your doctor or health care professional as soon as possible: -allergic reactions like skin rash, itching or hives, swelling of the face, lips, or tongue -blue tint to skin -chest tightness, pain -difficulty breathing, wheezing -dizziness -red, swollen painful area on the leg Side effects that usually do not require medical attention (report to your doctor or health care professional if they continue or are bothersome): -diarrhea -headache This list may not describe all possible side effects. Call your doctor for medical advice about side effects. You may report side effects to FDA at 1-800-FDA-1088. Where should I keep my medicine? Keep out of the reach of children. Store at room temperature between 15 and 30 degrees C (59 and 85 degrees F). Protect from light. Throw away any unused medicine after the expiration date. NOTE: This sheet is a summary. It may not cover all possible information. If you have questions about this medicine, talk to your doctor, pharmacist, or   health care provider.  2018 Elsevier/Gold Standard (2007-07-30 22:10:20)  

## 2017-02-13 ENCOUNTER — Ambulatory Visit
Admission: RE | Admit: 2017-02-13 | Discharge: 2017-02-13 | Disposition: A | Discharge status: Home / Self Care | Payer: Medicare HMO | Source: Ambulatory Visit | Attending: Internal Medicine | Admitting: Internal Medicine

## 2017-02-13 DIAGNOSIS — Z1231 Encounter for screening mammogram for malignant neoplasm of breast: Secondary | ICD-10-CM

## 2017-02-15 ENCOUNTER — Other Ambulatory Visit (HOSPITAL_BASED_OUTPATIENT_CLINIC_OR_DEPARTMENT_OTHER): Payer: Medicare HMO

## 2017-02-15 ENCOUNTER — Ambulatory Visit (HOSPITAL_BASED_OUTPATIENT_CLINIC_OR_DEPARTMENT_OTHER): Payer: Medicare HMO

## 2017-02-15 VITALS — BP 122/70 | HR 73 | Temp 98.2°F | Resp 18

## 2017-02-15 DIAGNOSIS — D510 Vitamin B12 deficiency anemia due to intrinsic factor deficiency: Secondary | ICD-10-CM | POA: Diagnosis not present

## 2017-02-15 DIAGNOSIS — D6489 Other specified anemias: Secondary | ICD-10-CM

## 2017-02-15 LAB — COMPREHENSIVE METABOLIC PANEL
ALK PHOS: 92 U/L (ref 40–150)
ALT: 9 U/L (ref 0–55)
AST: 14 U/L (ref 5–34)
Albumin: 3.9 g/dL (ref 3.5–5.0)
Anion Gap: 8 mEq/L (ref 3–11)
BUN: 13.5 mg/dL (ref 7.0–26.0)
CHLORIDE: 107 meq/L (ref 98–109)
CO2: 27 meq/L (ref 22–29)
Calcium: 9.2 mg/dL (ref 8.4–10.4)
Creatinine: 0.9 mg/dL (ref 0.6–1.1)
GLUCOSE: 180 mg/dL — AB (ref 70–140)
POTASSIUM: 4.3 meq/L (ref 3.5–5.1)
SODIUM: 142 meq/L (ref 136–145)
Total Bilirubin: 0.64 mg/dL (ref 0.20–1.20)
Total Protein: 7 g/dL (ref 6.4–8.3)

## 2017-02-15 LAB — CBC & DIFF AND RETIC
BASO%: 0.4 % (ref 0.0–2.0)
Basophils Absolute: 0 10*3/uL (ref 0.0–0.1)
EOS%: 4.9 % (ref 0.0–7.0)
Eosinophils Absolute: 0.2 10*3/uL (ref 0.0–0.5)
HCT: 38.5 % (ref 34.8–46.6)
HGB: 12.2 g/dL (ref 11.6–15.9)
Immature Retic Fract: 0.4 % — ABNORMAL LOW (ref 1.60–10.00)
LYMPH%: 30.3 % (ref 14.0–49.7)
MCH: 30.7 pg (ref 25.1–34.0)
MCHC: 31.7 g/dL (ref 31.5–36.0)
MCV: 96.7 fL (ref 79.5–101.0)
MONO#: 0.2 10*3/uL (ref 0.1–0.9)
MONO%: 4.1 % (ref 0.0–14.0)
NEUT%: 60.3 % (ref 38.4–76.8)
NEUTROS ABS: 2.8 10*3/uL (ref 1.5–6.5)
Platelets: 218 10*3/uL (ref 145–400)
RBC: 3.98 10*6/uL (ref 3.70–5.45)
RDW: 13.2 % (ref 11.2–14.5)
RETIC %: 1.2 % (ref 0.70–2.10)
Retic Ct Abs: 47.76 10*3/uL (ref 33.70–90.70)
WBC: 4.7 10*3/uL (ref 3.9–10.3)
lymph#: 1.4 10*3/uL (ref 0.9–3.3)

## 2017-02-15 LAB — IRON AND TIBC
%SAT: 52 % (ref 21–57)
IRON: 139 ug/dL (ref 41–142)
TIBC: 269 ug/dL (ref 236–444)
UIBC: 130 ug/dL (ref 120–384)

## 2017-02-15 LAB — FERRITIN: Ferritin: 140 ng/ml (ref 9–269)

## 2017-02-15 MED ORDER — CYANOCOBALAMIN 1000 MCG/ML IJ SOLN
1000.0000 ug | Freq: Once | INTRAMUSCULAR | Status: AC
  Administered 2017-02-15: 1000 ug via INTRAMUSCULAR

## 2017-02-15 NOTE — Patient Instructions (Signed)

## 2017-02-16 ENCOUNTER — Telehealth: Payer: Self-pay | Admitting: Emergency Medicine

## 2017-02-16 NOTE — Telephone Encounter (Addendum)
Pt verbalized understanding of this note. Lab and followup appt both scheduled.   ----- Message from Alla Feeling, NP sent at 02/16/2017 10:00 AM EDT ----- Please call patient let her know iron studies are normal, no feraheme needed at this time. Continue lab and f/u as scheduled (should have lab in 3-4 months and f/u in 5 months per Dr. Ernestina Penna last note). Thanks

## 2017-03-20 ENCOUNTER — Ambulatory Visit (HOSPITAL_BASED_OUTPATIENT_CLINIC_OR_DEPARTMENT_OTHER): Payer: Medicare HMO

## 2017-03-20 VITALS — BP 124/86 | HR 85 | Temp 98.7°F | Resp 20

## 2017-03-20 DIAGNOSIS — D510 Vitamin B12 deficiency anemia due to intrinsic factor deficiency: Secondary | ICD-10-CM | POA: Diagnosis not present

## 2017-03-20 MED ORDER — CYANOCOBALAMIN 1000 MCG/ML IJ SOLN
1000.0000 ug | Freq: Once | INTRAMUSCULAR | Status: AC
  Administered 2017-03-20: 1000 ug via INTRAMUSCULAR

## 2017-03-20 NOTE — Patient Instructions (Signed)

## 2017-03-30 ENCOUNTER — Emergency Department (HOSPITAL_COMMUNITY): Payer: Medicare HMO

## 2017-03-30 ENCOUNTER — Encounter (HOSPITAL_COMMUNITY): Payer: Self-pay | Admitting: Emergency Medicine

## 2017-03-30 ENCOUNTER — Emergency Department (HOSPITAL_COMMUNITY)
Admission: EM | Admit: 2017-03-30 | Discharge: 2017-03-30 | Disposition: A | Discharge status: Home / Self Care | Payer: Medicare HMO | Attending: Emergency Medicine | Admitting: Emergency Medicine

## 2017-03-30 ENCOUNTER — Ambulatory Visit (HOSPITAL_COMMUNITY): Payer: Self-pay | Admitting: Psychiatry

## 2017-03-30 DIAGNOSIS — Z23 Encounter for immunization: Secondary | ICD-10-CM | POA: Diagnosis not present

## 2017-03-30 DIAGNOSIS — Z91010 Allergy to peanuts: Secondary | ICD-10-CM | POA: Diagnosis not present

## 2017-03-30 DIAGNOSIS — M25511 Pain in right shoulder: Secondary | ICD-10-CM | POA: Diagnosis not present

## 2017-03-30 DIAGNOSIS — Z853 Personal history of malignant neoplasm of breast: Secondary | ICD-10-CM | POA: Diagnosis not present

## 2017-03-30 DIAGNOSIS — W19XXXA Unspecified fall, initial encounter: Secondary | ICD-10-CM

## 2017-03-30 DIAGNOSIS — Z7982 Long term (current) use of aspirin: Secondary | ICD-10-CM | POA: Insufficient documentation

## 2017-03-30 DIAGNOSIS — I10 Essential (primary) hypertension: Secondary | ICD-10-CM | POA: Insufficient documentation

## 2017-03-30 DIAGNOSIS — E119 Type 2 diabetes mellitus without complications: Secondary | ICD-10-CM | POA: Diagnosis not present

## 2017-03-30 DIAGNOSIS — Z7984 Long term (current) use of oral hypoglycemic drugs: Secondary | ICD-10-CM | POA: Insufficient documentation

## 2017-03-30 DIAGNOSIS — Z79899 Other long term (current) drug therapy: Secondary | ICD-10-CM | POA: Diagnosis not present

## 2017-03-30 DIAGNOSIS — M25562 Pain in left knee: Secondary | ICD-10-CM | POA: Insufficient documentation

## 2017-03-30 MED ORDER — LIDOCAINE 5 % EX PTCH: 1.0000 | MEDICATED_PATCH | CUTANEOUS | 0 refills | Status: DC

## 2017-03-30 MED ORDER — TETANUS-DIPHTH-ACELL PERTUSSIS 5-2.5-18.5 LF-MCG/0.5 IM SUSP
0.5000 mL | Freq: Once | INTRAMUSCULAR | Status: AC
  Administered 2017-03-30: 0.5 mL via INTRAMUSCULAR
  Filled 2017-03-30: qty 0.5

## 2017-03-30 MED ORDER — BACITRACIN ZINC 500 UNIT/GM EX OINT
TOPICAL_OINTMENT | Freq: Once | CUTANEOUS | Status: AC
  Administered 2017-03-30: 1 via TOPICAL
  Filled 2017-03-30: qty 0.9

## 2017-03-30 NOTE — ED Provider Notes (Signed)
[facility] DEPT Provider Note   CSN: [id number] Arrival date & time: 03/30/17  1354     History   Chief Complaint Chief Complaint  Patient presents with  . Fall  . Shoulder Pain    HPI Kayla Mayo is a 61 y.o. female.  HPI 61 year old female past medical history significant for arthritis, chronic pain, hypertension, obesity, diabetes presents the ED after mechanical fall.  Patient states that she was walking out of her doctor's office when she slipped on a rock.  Patient states that she landed on her left knee.  She also states that she pulled something in her right shoulder.  Patient has been ambulatory since the event.  Patient denies head injury or LOC.  States that her knee hurts with range of motion and palpation.  Also notes an abrasion to her left knee.  States her tetanus shot is not up-to-date.  Patient states that she did not land on her right shoulder however she thinks she may pulled something.  States the pain is worse with range of motion and palpation.  Patient came by EMS.  She was given IV pain medicine by EMS in route.  The patient denies any other injury at this time.  Denies any paresthesias or weakness.  Denies any associated headache, vision changes, neck pain, back pain, chest pain, abdominal pain.  Patient states that she has an appointment the orthopedic doctor on 10/5 for her chronic left hip pain and she also needs bilateral knee replacement.  Patient states that she is on chronic pain medication including hydrocodone and gabapentin for her arthritic pain.  She denies being on blood thinners. Past Medical History:  Diagnosis Date  . Anal fissure   . Anemia 03/17/2011  . Anxiety   . Arthritis   . Breast cancer ([place])   . Bursitis of hip   . Chronic headaches   . Colon polyps   . Depression   . Diabetes mellitus   . Esophageal spasm   . Gallstones   . GERD (gastroesophageal reflux disease)   . Hyperlipidemia   .  Hypertension   . IBS (irritable bowel syndrome)   . Neuropathy   . Obesity   . Osteopenia 07/2011   t score -1.5 FRAX 2.1%/0%  . Status post cardiac catheterization   . Vertigo    Occurs 6 times a month    Patient Active Problem List   Diagnosis Date Noted  . Chronic pain syndrome 12/30/2015  . Relationship problem between partners 12/30/2015  . Anemia of decreased vitamin B12 absorption 10/12/2015  . Iron deficiency anemia 11/28/2013  . Major depressive disorder, recurrent ([place]) 10/06/2012  . GAD (generalized anxiety disorder) 08/22/2012  . Osteopenia   . Vitamin D deficiency 03/23/2011  . Insomnia 03/23/2011  . Anemia 03/17/2011  . Proteinuria 10/19/2010  . Back pain 10/19/2010  . Diabetes mellitus with peripheral autonomic neuropathy ([place]) 07/27/2010  . Weight gain 07/27/2010  . SHOULDER PAIN, RIGHT 05/12/2010  . GASTROENTERITIS WITHOUT DEHYDRATION 09/17/2009  . DRY SKIN 06/03/2009  . HYPERLIPIDEMIA 05/07/2009  . Anemia due to other cause 05/07/2009  . Major depressive disorder, recurrent episode, moderate ([place]) 05/07/2009  . ESSENTIAL HYPERTENSION, BENIGN 05/07/2009  . ARTHRITIS, KNEES, BILATERAL 05/07/2009  . DM 01/21/2009  . DYSPEPSIA&OTHER [facility] DISORDERS FUNCTION STOMACH 01/21/2009  . CONSTIPATION 01/21/2009  . DYSPHAGIA UNSPECIFIED 01/21/2009    Past Surgical History:  Procedure Laterality Date  . APPENDECTOMY    . bilareral knee scopes    .  CARPAL TUNNEL RELEASE    . CHOLECYSTECTOMY    . GASTRIC BYPASS    . left shouler surgery    . lower back surgery    . TUBAL LIGATION      OB History    Gravida Para Term Preterm AB Living   2 2       2    SAB TAB Ectopic Multiple Live Births                   Home Medications    Prior to Admission medications   Medication Sig Start Date End Date Taking? Authorizing Provider  ACCU-CHEK FASTCLIX LANCETS MISC Use daily as directed for Dx: 250.00 02/10/15   [provider]  aspirin 81 MG tablet Take  81 mg by mouth daily.    [provider]  clobetasol ointment (TEMOVATE) 0.05 % 2 (two) times daily. On scalp bid 01/21/14   [provider]  cyanocobalamin (,VITAMIN B-12,) 1000 MCG/ML injection Inject 1,000 mcg into the muscle every 30 (thirty) days.      [provider]  cyclobenzaprine (FLEXERIL) 10 MG tablet Take 10 mg by mouth 3 (three) times daily as needed. 12/20/13   [provider]  diclofenac sodium (VOLTAREN) 1 % GEL APPLY 2g 4 times a day applied topically 30 day(s) 05/18/15   [provider]  EPIPEN 2-PAK 0.3 MG/0.3ML DEVI as needed. Reported on 10/12/2015 08/15/12   [provider]  escitalopram (LEXAPRO) 20 MG tablet Take 1 tablet (20 mg total) by mouth daily. one a day. 01/05/17   Merian Capron, MD  fluticasone (FLONASE) 50 MCG/ACT nasal spray Place 2 sprays into both nostrils 2 (two) times daily. 03/03/14   [provider]  furosemide (LASIX) 20 MG tablet Take 1 tablet (20 mg total) by mouth 2 (two) times daily. 05/16/11   Lucille Passy, MD  gabapentin (NEURONTIN) 300 MG capsule Take 600 mg by mouth 2 (two) times daily.  01/31/16 01/30/17  [provider]  HYDROcodone-acetaminophen (NORCO) 7.5-325 MG tablet Take 1 tablet by mouth 3 (three) times daily as needed for moderate pain.    [provider]  hydrocortisone (PROCTOSOL HC) 2.5 % rectal cream Place 1 application rectally as needed. 08/18/11   Lucille Passy, MD  levothyroxine (SYNTHROID, LEVOTHROID) 25 MCG tablet Take 25 mcg by mouth. 07/22/14   [provider]  Linaclotide (LINZESS) 290 MCG CAPS capsule Take 1 capsule by mouth daily.    [provider]  meclizine (ANTIVERT) 25 MG tablet Take 25 mg by mouth 3 (three) times daily as needed.      [provider]  metFORMIN (GLUCOPHAGE) 850 MG tablet Take 850 mg by mouth 2 (two) times daily with a meal.    [provider]  naproxen (NAPROSYN) 500 MG tablet Take 1 tablet (500 mg  total) by mouth 2 (two) times daily with a meal. 11/17/15   [place], Orson Ape, FNP  pantoprazole (PROTONIX) 20 MG tablet Take 40 mg by mouth daily.     [provider]  polyethylene glycol powder (GLYCOLAX/MIRALAX) powder Take 17 g by mouth 2 (two) times daily. 08/18/11   Lucille Passy, MD  rosuvastatin (CRESTOR) 10 MG tablet Take 10 mg by mouth daily.     [provider]  valsartan (DIOVAN) 80 MG tablet  12/08/15   [provider]  verapamil (CALAN) 80 MG tablet Take 1 tablet (80 mg total) by mouth daily. 05/16/11   Lucille Passy,  MD    Family History Family History  Problem Relation Age of Onset  . Diabetes Mother   . Stroke Mother   . Heart disease Mother   . Hypertension Mother   . Hyperlipidemia Mother   . Anxiety disorder Mother   . Diabetes Brother   . Alcohol abuse Brother   . Drug abuse Brother   . Alcohol abuse Father   . Cancer Father        Stomach  . Diabetes Maternal Aunt   . Diabetes Maternal Uncle   . Alcohol abuse Maternal Uncle   . Diabetes Maternal Grandmother   . Dementia Maternal Grandmother   . Depression Maternal Grandmother   . Alcohol abuse Paternal Uncle   . Cancer Paternal Uncle        Throat  . Alcohol abuse Maternal Grandfather   . Alcohol abuse Paternal Grandfather   . Alcohol abuse Maternal Uncle   . Alcohol abuse Paternal Uncle   . Alcohol abuse Paternal Uncle   . Cancer Paternal Aunt        Leukemia    Social History Social History   Tobacco Use  . Smoking status: Never Smoker  . Smokeless tobacco: Never Used  Substance Use Topics  . Alcohol use: No  . Drug use: No     Allergies   Chicken allergy; Anesthetics, amide; Morphine and related; Codeine; Corn-containing products; Other; Peanut-containing drug products; Sesame oil; Simvastatin; Wheat; and Lisinopril   Review of Systems Review of Systems  Eyes: Negative for visual disturbance.  Respiratory: Negative for shortness of breath.   Cardiovascular:  Negative for chest pain.  Gastrointestinal: Negative for abdominal pain, nausea and vomiting.  Musculoskeletal: Positive for arthralgias, joint swelling and myalgias. Negative for back pain, gait problem, neck pain and neck stiffness.  Skin: Positive for wound. Negative for color change.  Neurological: Negative for dizziness, weakness, light-headedness, numbness and headaches.     Physical Exam Updated Vital Signs BP 131/78   Pulse 71   Temp 98.5 F (36.9 C) (Oral)   Resp 16   Ht 5' 5.5" (1.664 m)   Wt 103.9 kg (229 lb)   LMP 05/02/2006   SpO2 93%   BMI 37.53 kg/m   Physical Exam  Constitutional: She is oriented to person, place, and time. She appears well-developed and well-nourished. No distress.  HENT:  Head: Normocephalic and atraumatic.  No skull depression.  No bilateral hemotympanum.  No septal hematoma.  Eyes: Conjunctivae and EOM are normal. Pupils are equal, round, and reactive to light. Right eye exhibits no discharge. Left eye exhibits no discharge. No scleral icterus.  Neck: Normal range of motion. Neck supple.  No c spine midline tenderness. No paraspinal tenderness. No deformities or step offs noted. Full ROM. Supple. No nuchal rigidity.    Pulmonary/Chest: No respiratory distress.  Abdominal: Soft. There is no tenderness. There is no guarding.  Musculoskeletal:       Right shoulder: She exhibits decreased range of motion, tenderness, bony tenderness and pain. She exhibits no swelling, no effusion, no crepitus, no deformity, no laceration, no spasm, normal pulse and normal strength.       Left knee: She exhibits decreased range of motion, swelling, laceration (abrasion with bleeding controlled) and bony tenderness. She exhibits no effusion, no ecchymosis, no deformity, no LCL laxity, normal patellar mobility, normal meniscus and no MCL laxity. Tenderness found. Medial joint line, lateral joint line, MCL, LCL and patellar tendon tenderness noted.  Full range of  motion  of the left ankle and left hip without pain.  No obvious deformity, warmth, erythema, effusion noted.  DP pulses are 2+ bilaterally.  Sensation intact.  Brisk cap refill.  Patient with pain with range of motion of the right shoulder.  Limited range of motion due to pain.  No obvious edema, erythema, deformity noted.  Radial pulses 2+ bilaterally.  Axillary nerve intact with good strength and sensation in the bicep.  Sensation intact in all dermatomes.  Brisk cap refill.  Neurological: She is alert and oriented to person, place, and time.  Skin: Skin is warm and dry. Capillary refill takes less than 2 seconds. No pallor.  Psychiatric: Her behavior is normal. Judgment and thought content normal.  Nursing note and vitals reviewed.    ED Treatments / Results  Labs (all labs ordered are listed, but only abnormal results are displayed) Labs Reviewed - No data to display  EKG  EKG Interpretation None       Radiology Dg Shoulder Right  Result Date: 03/30/2017 CLINICAL DATA:  61 year old female with right shoulder pain after fall earlier today. EXAM: RIGHT SHOULDER - 2+ VIEW COMPARISON:  None. FINDINGS: There is no evidence of fracture or dislocation. There are mild degenerative changes. No other focal bone abnormality. Soft tissues are unremarkable. IMPRESSION: Negative. Electronically Signed   By: Kristopher Oppenheim M.D.   On: 03/30/2017 16:23   Dg Knee Complete 4 Views Left  Result Date: 03/30/2017 CLINICAL DATA:  61 year old female with left knee pain after fall earlier today. EXAM: LEFT KNEE - COMPLETE 4+ VIEW COMPARISON:  None. FINDINGS: No evidence of fracture, dislocation, or joint effusion. No evidence of arthropathy or other focal bone abnormality. Soft tissues are unremarkable. IMPRESSION: Negative. Electronically Signed   By: Kristopher Oppenheim M.D.   On: 03/30/2017 16:24    Procedures Procedures (including critical care time)  Medications Ordered in ED Medications  Tdap  (BOOSTRIX) injection 0.5 mL (not administered)  bacitracin ointment (not administered)     Initial Impression / Assessment and Plan / ED Course  I have reviewed the triage vital signs and the nursing notes.  Pertinent labs & imaging results that were available during my care of the patient were reviewed by me and considered in my medical decision making (see chart for details).     Patient presents to the ED for evaluation of right shoulder pain and left knee pain after mechanical fall prior to arrival.  Patient is neurovascularly intact in all extremities.  Patient denies any head injury or LOC.  Patient is not on blood thinners.  Does have small abrasion to the left knee that was cleaned and dressed.  Patient's tetanus shot was updated in the ED.  Patient placed in the immobilizer.  X-rays of left knee and right shoulder reveal no acute findings.  Patient placed in a sling for her right shoulder.  Has follow-up with your doctor in 1 week.  Encouraged rice therapy at home with NSAIDs and her chronic pain medication.  She uses a walker at baseline.  She is amatory in the ED.  There are signs of intrathoracic, intra-abdominal, intracranial trauma.  Pt is hemodynamically stable, in NAD, & able to ambulate in the ED. Evaluation does not show pathology that would require ongoing emergent intervention or inpatient treatment. I explained the diagnosis to the patient. Pain has been managed & has no complaints prior to dc. Pt is comfortable with above plan and is stable for discharge at this time. All questions  were answered prior to disposition. Strict return precautions for f/u to the ED were discussed. Encouraged follow up with PCP.   Final Clinical Impressions(s) / ED Diagnoses   Final diagnoses:  Fall, initial encounter  Acute pain of right shoulder  Acute pain of left knee    ED Discharge Orders        Ordered    lidocaine (LIDODERM) 5 %  Every 24 hours     03/30/17 1732       Doristine Devoid, PA-C 03/30/17 1732    Milton Ferguson, MD 03/31/17 1501

## 2017-03-30 NOTE — Discharge Instructions (Signed)
Your x-ray showed no signs of fracture.  Have given you exercises to perform for your shoulder and your knee. Please rest, ice, compress and elevated the affected body part to help with swelling and pain.  T take your hydrocodone for pain as needed.  I would also make sure you take in some type of NSAIDs such as Aleve.  Use the knee immobilizer for comfort.  Use the sling for comfort.  Follow-up with orthopedic doctor at your scheduled appointment.  Return the ED if you develop any worsening symptoms including worsening swelling, signs of infection, numbness or tingling.

## 2017-03-30 NOTE — ED Triage Notes (Signed)
Pt comes via EMS from her doctor's office after falling in the parking lot.  Complains of right shoulder pain with a minor abrasion on her left elbow and left knee. No deformity noted at this time.  A&O x4. No blood thinners and no loss of consciousness. Did not hit head during fall. Vitals WNL.

## 2017-03-30 NOTE — ED Notes (Signed)
Also given 100 mcg of fentanyl in route.

## 2017-04-06 ENCOUNTER — Encounter (HOSPITAL_COMMUNITY): Payer: Self-pay | Admitting: Psychiatry

## 2017-04-06 ENCOUNTER — Ambulatory Visit (HOSPITAL_COMMUNITY): Payer: Medicare HMO | Admitting: Psychiatry

## 2017-04-06 ENCOUNTER — Other Ambulatory Visit: Payer: Self-pay

## 2017-04-06 VITALS — BP 122/84 | HR 95 | Ht 66.0 in | Wt 230.0 lb

## 2017-04-06 DIAGNOSIS — F3341 Major depressive disorder, recurrent, in partial remission: Secondary | ICD-10-CM | POA: Diagnosis not present

## 2017-04-06 DIAGNOSIS — M549 Dorsalgia, unspecified: Secondary | ICD-10-CM | POA: Diagnosis not present

## 2017-04-06 DIAGNOSIS — G894 Chronic pain syndrome: Secondary | ICD-10-CM

## 2017-04-06 DIAGNOSIS — F0630 Mood disorder due to known physiological condition, unspecified: Secondary | ICD-10-CM

## 2017-04-06 DIAGNOSIS — Z79899 Other long term (current) drug therapy: Secondary | ICD-10-CM | POA: Diagnosis not present

## 2017-04-06 DIAGNOSIS — Z811 Family history of alcohol abuse and dependence: Secondary | ICD-10-CM | POA: Diagnosis not present

## 2017-04-06 DIAGNOSIS — F411 Generalized anxiety disorder: Secondary | ICD-10-CM

## 2017-04-06 DIAGNOSIS — F5102 Adjustment insomnia: Secondary | ICD-10-CM

## 2017-04-06 DIAGNOSIS — M797 Fibromyalgia: Secondary | ICD-10-CM

## 2017-04-06 DIAGNOSIS — Z813 Family history of other psychoactive substance abuse and dependence: Secondary | ICD-10-CM | POA: Diagnosis not present

## 2017-04-06 DIAGNOSIS — Z818 Family history of other mental and behavioral disorders: Secondary | ICD-10-CM

## 2017-04-06 DIAGNOSIS — Z9884 Bariatric surgery status: Secondary | ICD-10-CM | POA: Diagnosis not present

## 2017-04-06 MED ORDER — ESCITALOPRAM OXALATE 20 MG PO TABS: 20.0000 mg | ORAL_TABLET | Freq: Every day | ORAL | 2 refills | Status: DC

## 2017-04-06 NOTE — Progress Notes (Signed)
[facility] Outpatient Follow up visit  Patient Identification: Kayla Mayo MRN:  [id number] Date of Evaluation:  04/06/2017 Referral Source: [name] and herself  Chief Complaint:   Chief Complaint    Follow-up; Depression     Visit Diagnosis:    ICD-10-CM   1. Major depressive disorder, recurrent episode, in partial remission ([place]) F33.41   2. Chronic pain syndrome G89.4   3. GAD (generalized anxiety disorder) F41.1   4. Mood disorder in conditions classified elsewhere F06.30   5. Adjustment insomnia F51.02     History of Present Illness:  61 years old currently married African-American female initially referred for management of depression and anxiety she suffers from chronic medical illnesses including fibromyalgia, chronic back pain and multiple surgeries history of gastric bypass.  Lexapro has been helping depression to certain extent but she recently injured her shoulder by setting down she is on a cast and that is upsetting her she feels she may have to end up doing the surgery plus back condition adds to her pain and tiredness    On gabapentin and pain meds. Looking forward for surgery to not rely on too much meds  No side effects reported Husband is supportive Timing more in morning    Aggravating factor: medical conditions. pain Modifying factor: family,  [place] power Severity of depression: 6.5/10    Past Medical History:  Past Medical History:  Diagnosis Date  . Anal fissure   . Anemia 03/17/2011  . Anxiety   . Arthritis   . Breast cancer ([place])   . Bursitis of hip   . Chronic headaches   . Colon polyps   . Depression   . Diabetes mellitus   . Esophageal spasm   . Gallstones   . GERD (gastroesophageal reflux disease)   . Hyperlipidemia   . Hypertension   . IBS (irritable bowel syndrome)   . Neuropathy   . Obesity   . Osteopenia 07/2011   t score -1.5 FRAX 2.1%/0%  . Status post cardiac catheterization   . Vertigo    Occurs 6 times a month     Past Surgical History:  Procedure Laterality Date  . APPENDECTOMY    . bilareral knee scopes    . CARPAL TUNNEL RELEASE    . CHOLECYSTECTOMY    . GASTRIC BYPASS    . left shouler surgery    . lower back surgery    . TUBAL LIGATION      Family Psychiatric History: not aware or denies  Family History:  Family History  Problem Relation Age of Onset  . Diabetes Mother   . Stroke Mother   . Heart disease Mother   . Hypertension Mother   . Hyperlipidemia Mother   . Anxiety disorder Mother   . Diabetes Brother   . Alcohol abuse Brother   . Drug abuse Brother   . Alcohol abuse Father   . Cancer Father        Stomach  . Diabetes Maternal Aunt   . Diabetes Maternal Uncle   . Alcohol abuse Maternal Uncle   . Diabetes Maternal Grandmother   . Dementia Maternal Grandmother   . Depression Maternal Grandmother   . Alcohol abuse Paternal Uncle   . Cancer Paternal Uncle        Throat  . Alcohol abuse Maternal Grandfather   . Alcohol abuse Paternal Grandfather   . Alcohol abuse Maternal Uncle   . Alcohol abuse Paternal Uncle   . Alcohol abuse Paternal Uncle   .  Cancer Paternal Aunt        Leukemia    Social History:   Social History   Socioeconomic History  . Marital status: Married    Spouse name: None  . Number of children: 2  . Years of education: 38  . Highest education level: None  Social Needs  . Financial resource strain: None  . Food insecurity - worry: None  . Food insecurity - inability: None  . Transportation needs - medical: None  . Transportation needs - non-medical: None  Occupational History  . Occupation: Disabled    Employer: UNEMPLOYED  Tobacco Use  . Smoking status: Never Smoker  . Smokeless tobacco: Never Used  Substance and Sexual Activity  . Alcohol use: No  . Drug use: No  . Sexual activity: Not Currently    Partners: Male    Birth control/protection: Post-menopausal, Surgical    Comment: Tubal lig  Other Topics Concern  . None   Social History Narrative   Denies caffeine use      Allergies:   Allergies  Allergen Reactions  . Chicken Allergy     Sinus drainage  . Anesthetics, Amide Nausea And Vomiting  . Morphine And Related Itching  . Codeine   . Corn-Containing Products   . Other     Statins  . Peanut-Containing Drug Products   . Sesame Oil     Sinus drainage  . Simvastatin     Muscle pain  . Wheat     Sinus drainage  . Lisinopril Cough    Metabolic Disorder Labs: Lab Results  Component Value Date   HGBA1C 6.6 (H) 03/23/2011   MPG 134 11/27/2008   No results found for: PROLACTIN Lab Results  Component Value Date   CHOL 183 03/23/2011   TRIG 69.0 03/23/2011   HDL 69.60 03/23/2011   CHOLHDL 3 03/23/2011   VLDL 13.8 03/23/2011   LDLCALC 100 (H) 03/23/2011   LDLCALC 113 (H) 08/24/2010     Current Medications: Current Outpatient Medications  Medication Sig Dispense Refill  . cyanocobalamin (,VITAMIN B-12,) 1000 MCG/ML injection Inject 1,000 mcg into the muscle every 30 (thirty) days.      . cyclobenzaprine (FLEXERIL) 10 MG tablet Take 10 mg by mouth 3 (three) times daily as needed.    Marland Kitchen escitalopram (LEXAPRO) 20 MG tablet Take 1 tablet (20 mg total) by mouth daily. one a day. 30 tablet 2  . fluticasone (FLONASE) 50 MCG/ACT nasal spray Place 2 sprays into both nostrils 2 (two) times daily.    . furosemide (LASIX) 20 MG tablet Take 1 tablet (20 mg total) by mouth 2 (two) times daily. 180 tablet 3  . gabapentin (NEURONTIN) 300 MG capsule Take 600 mg by mouth 2 (two) times daily.     Marland Kitchen HYDROcodone-acetaminophen (NORCO) 7.5-325 MG tablet Take 1 tablet by mouth 3 (three) times daily as needed for moderate pain.    . hydrocortisone (PROCTOSOL HC) 2.5 % rectal cream Place 1 application rectally as needed. 30 g 0  . levothyroxine (SYNTHROID, LEVOTHROID) 25 MCG tablet Take 25 mcg by mouth.    . meclizine (ANTIVERT) 25 MG tablet Take 25 mg by mouth 3 (three) times daily as needed.      .  metFORMIN (GLUCOPHAGE) 850 MG tablet Take 850 mg by mouth 2 (two) times daily with a meal.    . naproxen (NAPROSYN) 500 MG tablet Take 1 tablet (500 mg total) by mouth 2 (two) times daily with a meal. 20 tablet 0  .  pantoprazole (PROTONIX) 20 MG tablet Take 40 mg by mouth daily.     . polyethylene glycol powder (GLYCOLAX/MIRALAX) powder Take 17 g by mouth 2 (two) times daily. 255 g 0  . rosuvastatin (CRESTOR) 10 MG tablet Take 10 mg by mouth daily.     . verapamil (CALAN) 80 MG tablet Take 1 tablet (80 mg total) by mouth daily. 90 tablet 3  . ACCU-CHEK FASTCLIX LANCETS MISC Use daily as directed for Dx: 250.00    . aspirin 81 MG tablet Take 81 mg by mouth daily.    . clobetasol ointment (TEMOVATE) 0.05 % 2 (two) times daily. On scalp bid  2  . diclofenac sodium (VOLTAREN) 1 % GEL APPLY 2g 4 times a day applied topically 30 day(s)    . EPIPEN 2-PAK 0.3 MG/0.3ML DEVI as needed. Reported on 10/12/2015    . lidocaine (LIDODERM) 5 % Place 1 patch onto the skin daily. Remove & Discard patch within 12 hours or as directed by MD (Patient not taking: Reported on 04/06/2017) 10 patch 0  . Linaclotide (LINZESS) 290 MCG CAPS capsule Take 1 capsule by mouth daily.    . valsartan (DIOVAN) 80 MG tablet   5   No current facility-administered medications for this visit.    Facility-Administered Medications Ordered in Other Visits  Medication Dose Route Frequency Provider Last Rate Last Dose  . cyanocobalamin ((VITAMIN B-12)) injection 1,000 mcg  1,000 mcg Intramuscular Q28 days Marcy Panning, MD          Psychiatric Specialty Exam: Review of Systems  Constitutional: Positive for malaise/fatigue. Negative for fever.  Cardiovascular: Negative for palpitations.  Gastrointestinal: Negative for nausea.  Musculoskeletal: Positive for joint pain.  Skin: Negative for rash.  Psychiatric/Behavioral: Negative for substance abuse and suicidal ideas.    Blood pressure 122/84, pulse 95, height 5\' 6"  (1.676 m),  weight 230 lb (104.3 kg), last menstrual period 05/02/2006.Body mass index is 37.12 kg/m.  General Appearance: Casual  Eye Contact:  Fair  Speech:  Slow  Volume:  Decreased  Mood:   subdued  Affect:  congruent  Thought Process:  Goal Directed  Orientation:  Full (Time, Place, and Person)  Thought Content:  Rumination  Suicidal Thoughts:  No  Homicidal Thoughts:  No  Memory:  Immediate;   Poor Recent;   Fair  Judgement:  Fair  Insight:  Shallow  Psychomotor Activity:  Decreased  Concentration:  Concentration: Fair and Attention Span: Fair  Recall:  [employer] of Knowledge:Fair  Language: Fair  Akathisia:  No  Handed:  Right  AIMS (if indicated):    Assets:  Desire for Improvement Resilience  ADL's:  Intact  Cognition: WNL  Sleep:  fair    Treatment Plan Summary: Medication management and Plan as follows  Mood disorder NOS: fluctuates depending on pain level Following with provider for possible surgery  Depression: grief and subdued, friend died . lexapro helps will continue GAD: ongoing with some better or worse days. Continue lexapro.  Will hold off increasing lexapro as she is having shoulder injury may be contributing to anxiety Questions addressed. fU 2 months   Merian Capron, MD 12/6/20181:23 PM

## 2017-04-19 ENCOUNTER — Ambulatory Visit (HOSPITAL_BASED_OUTPATIENT_CLINIC_OR_DEPARTMENT_OTHER): Payer: Medicare HMO

## 2017-04-19 VITALS — BP 148/90 | HR 99 | Temp 98.6°F | Resp 20

## 2017-04-19 DIAGNOSIS — D510 Vitamin B12 deficiency anemia due to intrinsic factor deficiency: Secondary | ICD-10-CM | POA: Diagnosis not present

## 2017-04-19 MED ORDER — CYANOCOBALAMIN 1000 MCG/ML IJ SOLN
1000.0000 ug | Freq: Once | INTRAMUSCULAR | Status: AC
  Administered 2017-04-19: 1000 ug via INTRAMUSCULAR

## 2017-04-19 NOTE — Patient Instructions (Signed)

## 2017-05-19 ENCOUNTER — Inpatient Hospital Stay: Payer: Medicare HMO | Attending: Hematology

## 2017-05-19 VITALS — BP 150/95 | HR 95 | Temp 98.7°F | Resp 18

## 2017-05-19 DIAGNOSIS — D510 Vitamin B12 deficiency anemia due to intrinsic factor deficiency: Secondary | ICD-10-CM | POA: Diagnosis not present

## 2017-05-19 MED ORDER — CYANOCOBALAMIN 1000 MCG/ML IJ SOLN
1000.0000 ug | Freq: Once | INTRAMUSCULAR | Status: AC
  Administered 2017-05-19: 1000 ug via INTRAMUSCULAR

## 2017-05-19 NOTE — Patient Instructions (Signed)

## 2017-06-19 ENCOUNTER — Inpatient Hospital Stay: Payer: Medicare HMO | Attending: Hematology

## 2017-06-19 ENCOUNTER — Inpatient Hospital Stay: Payer: Medicare HMO

## 2017-06-19 VITALS — BP 144/89 | HR 90 | Temp 98.2°F | Resp 20

## 2017-06-19 DIAGNOSIS — D510 Vitamin B12 deficiency anemia due to intrinsic factor deficiency: Secondary | ICD-10-CM | POA: Insufficient documentation

## 2017-06-19 DIAGNOSIS — D519 Vitamin B12 deficiency anemia, unspecified: Secondary | ICD-10-CM

## 2017-06-19 DIAGNOSIS — D6489 Other specified anemias: Secondary | ICD-10-CM

## 2017-06-19 LAB — CBC WITH DIFFERENTIAL/PLATELET
BASOS ABS: 0 10*3/uL (ref 0.0–0.1)
BASOS PCT: 1 %
EOS ABS: 0.2 10*3/uL (ref 0.0–0.5)
EOS PCT: 3 %
HCT: 39.3 % (ref 34.8–46.6)
Hemoglobin: 12.6 g/dL (ref 11.6–15.9)
Lymphocytes Relative: 31 %
Lymphs Abs: 1.9 10*3/uL (ref 0.9–3.3)
MCH: 31.2 pg (ref 25.1–34.0)
MCHC: 32.1 g/dL (ref 31.5–36.0)
MCV: 97.3 fL (ref 79.5–101.0)
MONO ABS: 0.3 10*3/uL (ref 0.1–0.9)
MONOS PCT: 5 %
Neutro Abs: 3.6 10*3/uL (ref 1.5–6.5)
Neutrophils Relative %: 60 %
PLATELETS: 279 10*3/uL (ref 145–400)
RBC: 4.04 MIL/uL (ref 3.70–5.45)
RDW: 13.9 % (ref 11.2–14.5)
WBC: 6 10*3/uL (ref 3.9–10.3)

## 2017-06-19 LAB — COMPREHENSIVE METABOLIC PANEL
ALBUMIN: 4.1 g/dL (ref 3.5–5.0)
ALK PHOS: 101 U/L (ref 40–150)
ALT: 36 U/L (ref 0–55)
ANION GAP: 12 — AB (ref 3–11)
AST: 23 U/L (ref 5–34)
BILIRUBIN TOTAL: 0.7 mg/dL (ref 0.2–1.2)
BUN: 12 mg/dL (ref 7–26)
CALCIUM: 9.8 mg/dL (ref 8.4–10.4)
CO2: 25 mmol/L (ref 22–29)
CREATININE: 0.87 mg/dL (ref 0.60–1.10)
Chloride: 105 mmol/L (ref 98–109)
GFR calc Af Amer: >60 mL/min (ref >60)
GFR calc non Af Amer: >60 mL/min (ref >60)
GLUCOSE: 173 mg/dL — AB (ref 70–140)
Potassium: 3.4 mmol/L — ABNORMAL LOW (ref 3.5–5.1)
SODIUM: 142 mmol/L (ref 136–145)
Total Protein: 7.2 g/dL (ref 6.4–8.3)

## 2017-06-19 LAB — FERRITIN: Ferritin: 111 ng/mL (ref 9–269)

## 2017-06-19 LAB — IRON AND TIBC
Iron: 108 ug/dL (ref 41–142)
SATURATION RATIOS: 37 % (ref 21–57)
TIBC: 290 ug/dL (ref 236–444)
UIBC: 182 ug/dL

## 2017-06-19 LAB — RETICULOCYTES
RBC.: 4.04 MIL/uL (ref 3.70–5.45)
RETIC COUNT ABSOLUTE: 105 10*3/uL — AB (ref 33.7–90.7)
RETIC CT PCT: 2.6 % — AB (ref 0.7–2.1)

## 2017-06-19 MED ORDER — CYANOCOBALAMIN 1000 MCG/ML IJ SOLN
1000.0000 ug | Freq: Once | INTRAMUSCULAR | Status: AC
  Administered 2017-06-19: 1000 ug via INTRAMUSCULAR

## 2017-06-19 NOTE — Patient Instructions (Signed)

## 2017-06-21 ENCOUNTER — Telehealth: Payer: Self-pay | Admitting: *Deleted

## 2017-06-21 NOTE — Telephone Encounter (Signed)
TCT patient regarding recent lab results.  Spoke with patient and informed her that her labs were good and that Dr. Burr Medico had no concerns.  Advised her that her K+ was a little bit low and encouraged her to eat additional K+ rich foods. Pt verbalized understanding and was able to relay which foods were higher in K+ Pt is aware of appt in March.

## 2017-06-28 ENCOUNTER — Encounter (HOSPITAL_COMMUNITY): Payer: Self-pay | Admitting: Psychiatry

## 2017-06-28 ENCOUNTER — Ambulatory Visit (HOSPITAL_COMMUNITY): Payer: Medicare HMO | Admitting: Psychiatry

## 2017-06-28 DIAGNOSIS — Z818 Family history of other mental and behavioral disorders: Secondary | ICD-10-CM | POA: Diagnosis not present

## 2017-06-28 DIAGNOSIS — G894 Chronic pain syndrome: Secondary | ICD-10-CM | POA: Diagnosis not present

## 2017-06-28 DIAGNOSIS — Z811 Family history of alcohol abuse and dependence: Secondary | ICD-10-CM

## 2017-06-28 DIAGNOSIS — F3341 Major depressive disorder, recurrent, in partial remission: Secondary | ICD-10-CM

## 2017-06-28 DIAGNOSIS — Z560 Unemployment, unspecified: Secondary | ICD-10-CM

## 2017-06-28 DIAGNOSIS — Z813 Family history of other psychoactive substance abuse and dependence: Secondary | ICD-10-CM

## 2017-06-28 DIAGNOSIS — M2550 Pain in unspecified joint: Secondary | ICD-10-CM

## 2017-06-28 DIAGNOSIS — Z736 Limitation of activities due to disability: Secondary | ICD-10-CM | POA: Diagnosis not present

## 2017-06-28 DIAGNOSIS — F411 Generalized anxiety disorder: Secondary | ICD-10-CM | POA: Diagnosis not present

## 2017-06-28 DIAGNOSIS — Z810 Family history of intellectual disabilities: Secondary | ICD-10-CM

## 2017-06-28 MED ORDER — ESCITALOPRAM OXALATE 20 MG PO TABS: 20.0000 mg | ORAL_TABLET | Freq: Every day | ORAL | 2 refills | Status: DC

## 2017-06-28 NOTE — Progress Notes (Signed)
[facility] Outpatient Follow up visit  Patient Identification: Kayla Mayo MRN:  [id number] Date of Evaluation:  06/28/2017 Referral Source: [name] and herself  Chief Complaint:   Chief Complaint    Follow-up; Medication Refill     Visit Diagnosis:    ICD-10-CM   1. Major depressive disorder, recurrent episode, in partial remission ([place]) F33.41   2. Chronic pain syndrome G89.4   3. GAD (generalized anxiety disorder) F41.1     History of Present Illness:  62 years old currently married African-American female initially referred for management of depression and anxiety she suffers from chronic medical illnesses including fibromyalgia, chronic back pain and multiple surgeries history of gastric bypass.  Pain effects mood. Daughter takes her to [place] for break. Husband doesn't do much at home it stresses her    Has had shoulder surgery. No side effects reported  Timing more in morning    Aggravating factor: medical conditions. pain Modifying factor: will power, daughter Severity of depression: 6.5/10    Past Medical History:  Past Medical History:  Diagnosis Date  . Anal fissure   . Anemia 03/17/2011  . Anxiety   . Arthritis   . Breast cancer ([place])   . Bursitis of hip   . Chronic headaches   . Colon polyps   . Depression   . Diabetes mellitus   . Esophageal spasm   . Gallstones   . GERD (gastroesophageal reflux disease)   . Hyperlipidemia   . Hypertension   . IBS (irritable bowel syndrome)   . Neuropathy   . Obesity   . Osteopenia 07/2011   t score -1.5 FRAX 2.1%/0%  . Status post cardiac catheterization   . Vertigo    Occurs 6 times a month    Past Surgical History:  Procedure Laterality Date  . APPENDECTOMY    . bilareral knee scopes    . CARPAL TUNNEL RELEASE    . CHOLECYSTECTOMY    . GASTRIC BYPASS    . left shouler surgery    . lower back surgery    . TUBAL LIGATION      Family Psychiatric History: not aware or denies  Family  History:  Family History  Problem Relation Age of Onset  . Diabetes Mother   . Stroke Mother   . Heart disease Mother   . Hypertension Mother   . Hyperlipidemia Mother   . Anxiety disorder Mother   . Diabetes Brother   . Alcohol abuse Brother   . Drug abuse Brother   . Alcohol abuse Father   . Cancer Father        Stomach  . Diabetes Maternal Aunt   . Diabetes Maternal Uncle   . Alcohol abuse Maternal Uncle   . Diabetes Maternal Grandmother   . Dementia Maternal Grandmother   . Depression Maternal Grandmother   . Alcohol abuse Paternal Uncle   . Cancer Paternal Uncle        Throat  . Alcohol abuse Maternal Grandfather   . Alcohol abuse Paternal Grandfather   . Alcohol abuse Maternal Uncle   . Alcohol abuse Paternal Uncle   . Alcohol abuse Paternal Uncle   . Cancer Paternal Aunt        Leukemia    Social History:   Social History   Socioeconomic History  . Marital status: Married    Spouse name: None  . Number of children: 2  . Years of education: 73  . Highest education level: None  Social  Needs  . Financial resource strain: None  . Food insecurity - worry: None  . Food insecurity - inability: None  . Transportation needs - medical: None  . Transportation needs - non-medical: None  Occupational History  . Occupation: Disabled    Employer: UNEMPLOYED  Tobacco Use  . Smoking status: Never Smoker  . Smokeless tobacco: Never Used  Substance and Sexual Activity  . Alcohol use: No  . Drug use: No  . Sexual activity: Not Currently    Partners: Male    Birth control/protection: Post-menopausal, Surgical    Comment: Tubal lig  Other Topics Concern  . None  Social History Narrative   Denies caffeine use      Allergies:   Allergies  Allergen Reactions  . Chicken Allergy     Sinus drainage  . Anesthetics, Amide Nausea And Vomiting  . Morphine And Related Itching  . Codeine   . Corn-Containing Products   . Other     Statins  . Peanut-Containing Drug  Products   . Sesame Oil     Sinus drainage  . Simvastatin     Muscle pain  . Wheat     Sinus drainage  . Lisinopril Cough    Metabolic Disorder Labs: Lab Results  Component Value Date   HGBA1C 6.6 (H) 03/23/2011   MPG 134 11/27/2008   No results found for: PROLACTIN Lab Results  Component Value Date   CHOL 183 03/23/2011   TRIG 69.0 03/23/2011   HDL 69.60 03/23/2011   CHOLHDL 3 03/23/2011   VLDL 13.8 03/23/2011   LDLCALC 100 (H) 03/23/2011   LDLCALC 113 (H) 08/24/2010     Current Medications: Current Outpatient Medications  Medication Sig Dispense Refill  . ACCU-CHEK FASTCLIX LANCETS MISC Use daily as directed for Dx: 250.00    . aspirin 81 MG tablet Take 81 mg by mouth daily.    . clobetasol ointment (TEMOVATE) 0.05 % 2 (two) times daily. On scalp bid  2  . cyanocobalamin (,VITAMIN B-12,) 1000 MCG/ML injection Inject 1,000 mcg into the muscle every 30 (thirty) days.      . cyclobenzaprine (FLEXERIL) 10 MG tablet Take 10 mg by mouth 3 (three) times daily as needed.    . diclofenac sodium (VOLTAREN) 1 % GEL APPLY 2g 4 times a day applied topically 30 day(s)    . EPIPEN 2-PAK 0.3 MG/0.3ML DEVI as needed. Reported on 10/12/2015    . escitalopram (LEXAPRO) 20 MG tablet Take 1 tablet (20 mg total) by mouth daily. one a day. 30 tablet 2  . fluticasone (FLONASE) 50 MCG/ACT nasal spray Place 2 sprays into both nostrils 2 (two) times daily.    . furosemide (LASIX) 20 MG tablet Take 1 tablet (20 mg total) by mouth 2 (two) times daily. 180 tablet 3  . HYDROcodone-acetaminophen (NORCO) 7.5-325 MG tablet Take 1 tablet by mouth 3 (three) times daily as needed for moderate pain.    . hydrocortisone (PROCTOSOL HC) 2.5 % rectal cream Place 1 application rectally as needed. 30 g 0  . levothyroxine (SYNTHROID, LEVOTHROID) 25 MCG tablet Take 25 mcg by mouth.    . lidocaine (LIDODERM) 5 % Place 1 patch onto the skin daily. Remove & Discard patch within 12 hours or as directed by MD (Patient  not taking: Reported on 04/06/2017) 10 patch 0  . Linaclotide (LINZESS) 290 MCG CAPS capsule Take 1 capsule by mouth daily.    . meclizine (ANTIVERT) 25 MG tablet Take 25 mg by mouth 3 (  three) times daily as needed.      . metFORMIN (GLUCOPHAGE) 850 MG tablet Take 850 mg by mouth 2 (two) times daily with a meal.    . naproxen (NAPROSYN) 500 MG tablet Take 1 tablet (500 mg total) by mouth 2 (two) times daily with a meal. 20 tablet 0  . pantoprazole (PROTONIX) 20 MG tablet Take 40 mg by mouth daily.     . polyethylene glycol powder (GLYCOLAX/MIRALAX) powder Take 17 g by mouth 2 (two) times daily. 255 g 0  . rosuvastatin (CRESTOR) 10 MG tablet Take 10 mg by mouth daily.     . valsartan (DIOVAN) 80 MG tablet   5  . verapamil (CALAN) 80 MG tablet Take 1 tablet (80 mg total) by mouth daily. 90 tablet 3   No current facility-administered medications for this visit.    Facility-Administered Medications Ordered in Other Visits  Medication Dose Route Frequency Provider Last Rate Last Dose  . cyanocobalamin ((VITAMIN B-12)) injection 1,000 mcg  1,000 mcg Intramuscular Q28 days Marcy Panning, MD          Psychiatric Specialty Exam: Review of Systems  Constitutional: Positive for malaise/fatigue. Negative for fever.  Cardiovascular: Negative for palpitations.  Gastrointestinal: Negative for nausea.  Musculoskeletal: Positive for joint pain.  Skin: Negative for rash.  Psychiatric/Behavioral: Negative for substance abuse and suicidal ideas.    Last menstrual period 05/02/2006.There is no height or weight on file to calculate BMI.  General Appearance: Casual  Eye Contact:  Fair  Speech:  Slow  Volume:  Decreased  Mood:   Subdued   Affect:  congruent  Thought Process:  Goal Directed  Orientation:  Full (Time, Place, and Person)  Thought Content:  Rumination  Suicidal Thoughts:  No  Homicidal Thoughts:  No  Memory:  Immediate;   Poor Recent;   Fair  Judgement:  Fair  Insight:  Shallow   Psychomotor Activity:  Decreased  Concentration:  Concentration: Fair and Attention Span: Fair  Recall:  [employer] of Knowledge:Fair  Language: Fair  Akathisia:  No  Handed:  Right  AIMS (if indicated):    Assets:  Desire for Improvement Resilience  ADL's:  Intact  Cognition: WNL  Sleep:  fair    Treatment Plan Summary: Medication management and Plan as follows  Mood disorder NOS: fluctuates. Provided supportive therapy. lexapro helps to certain extent will continue Depression: not worse GAD: at times worse. Looks for support with daughter or do things. Continue lexapro  fu 40m. Renewed meds  Merian Capron, MD 2/27/201910:08 AM

## 2017-07-12 DIAGNOSIS — M7501 Adhesive capsulitis of right shoulder: Secondary | ICD-10-CM | POA: Insufficient documentation

## 2017-07-14 NOTE — Progress Notes (Signed)
[facility] PROGRESS NOTE DATE OF SERVICE: 07/17/2017    Kayla Messick, MD [address] [place] [address]  DIAGNOSIS: 62 y.o. 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   #2 She had iron infusions in past for a low Ferritin, last Feraheme infusion was 04/22/2015  CURRENT THERAPY: B12 injections every month and iron infuion prn if ferritin less than 100   INTERVAL HISTORY:  Kayla Mayo 62 y.o. female returns for follow up visit today. She presents to the clinic today with her family member. She reports she is doing okay overall and she has been receiving B12 injections monthly. However, she still feels fatigued. She reports she doesn't feel like doing anything and she sleeps a lot. She doesn't even want to do little things like brush her teeth or bathe. She states she recently hurt her shoulder and has been going to PT twice a week and when she gets home she sleeps.   On review of systems, pt denies abdominal pain, cough, or any other complaints at this time. Pertinent positives are listed and detailed within the above HPI.   MEDICAL HISTORY: Past Medical History:  Diagnosis Date  . Anal fissure   . Anemia 03/17/2011  . Anxiety   . Arthritis   . Breast cancer ([place])   . Bursitis of hip   . Chronic headaches   . Colon polyps   . Depression   . Diabetes mellitus   . Esophageal spasm   . Gallstones   . GERD (gastroesophageal reflux disease)   . Hyperlipidemia   . Hypertension   . IBS (irritable bowel syndrome)   . Neuropathy   . Obesity   . Osteopenia 07/2011   t score -1.5 FRAX 2.1%/0%  . Status post cardiac catheterization   . Vertigo    Occurs 6 times a month    ALLERGIES:  is allergic to chicken allergy; anesthetics, amide; morphine and related; codeine; corn-containing products; other;  peanut-containing drug products; sesame oil; simvastatin; wheat; and lisinopril.  MEDICATIONS:  Current Outpatient Medications  Medication Sig Dispense Refill  . ACCU-CHEK FASTCLIX LANCETS MISC Use daily as directed for Dx: 250.00    . aspirin 81 MG tablet Take 81 mg by mouth daily.    . clobetasol ointment (TEMOVATE) 0.05 % 2 (two) times daily. On scalp bid  2  . cyanocobalamin (,VITAMIN B-12,) 1000 MCG/ML injection Inject 1,000 mcg into the muscle every 30 (thirty) days.      . cyclobenzaprine (FLEXERIL) 10 MG tablet Take 10 mg by mouth 3 (three) times daily as needed.    . diclofenac sodium (VOLTAREN) 1 % GEL APPLY 2g 4 times a day applied topically 30 day(s)    . EPIPEN 2-PAK 0.3 MG/0.3ML DEVI as needed. Reported on 10/12/2015    . escitalopram (LEXAPRO) 20 MG tablet Take 1 tablet (20 mg total) by mouth daily. one a day. 30 tablet 2  . fluticasone (FLONASE) 50 MCG/ACT nasal spray Place 2 sprays into both nostrils 2 (two) times daily.    . furosemide (LASIX) 20 MG tablet Take 1 tablet (20 mg total) by mouth 2 (two) times daily. 180 tablet 3  . HYDROcodone-acetaminophen (NORCO) 7.5-325 MG tablet Take 1 tablet by mouth 3 (three) times daily as needed for moderate pain.    . hydrocortisone (PROCTOSOL HC) 2.5 % rectal cream Place 1 application  rectally as needed. 30 g 0  . levothyroxine (SYNTHROID, LEVOTHROID) 25 MCG tablet Take 25 mcg by mouth.    . lidocaine (LIDODERM) 5 % Place 1 patch onto the skin daily. Remove & Discard patch within 12 hours or as directed by MD 10 patch 0  . Linaclotide (LINZESS) 290 MCG CAPS capsule Take 1 capsule by mouth daily.    . meclizine (ANTIVERT) 25 MG tablet Take 25 mg by mouth 3 (three) times daily as needed.      . metFORMIN (GLUCOPHAGE) 850 MG tablet Take 850 mg by mouth 2 (two) times daily with a meal.    . naproxen (NAPROSYN) 500 MG tablet Take 1 tablet (500 mg total) by mouth 2 (two) times daily with a meal. 20 tablet 0  . pantoprazole (PROTONIX) 20 MG  tablet Take 40 mg by mouth daily.     . polyethylene glycol powder (GLYCOLAX/MIRALAX) powder Take 17 g by mouth 2 (two) times daily. 255 g 0  . rosuvastatin (CRESTOR) 10 MG tablet Take 10 mg by mouth daily.     Marland Kitchen telmisartan (MICARDIS) 20 MG tablet Take 1 tablet by mouth daily.    . verapamil (CALAN) 80 MG tablet Take 1 tablet (80 mg total) by mouth daily. 90 tablet 3   No current facility-administered medications for this visit.    Facility-Administered Medications Ordered in Other Visits  Medication Dose Route Frequency Provider Last Rate Last Dose  . cyanocobalamin ((VITAMIN B-12)) injection 1,000 mcg  1,000 mcg Intramuscular Q28 days Marcy Panning, MD        SURGICAL HISTORY:  Past Surgical History:  Procedure Laterality Date  . APPENDECTOMY    . bilareral knee scopes    . CARPAL TUNNEL RELEASE    . CHOLECYSTECTOMY    . GASTRIC BYPASS    . left shouler surgery    . lower back surgery    . TUBAL LIGATION      REVIEW OF SYSTEMS:  Pertinent items are noted in HPI.   PHYSICAL EXAMINATION:  Vitals:   07/17/17 1022  BP: 135/81  Pulse: 94  Resp: 18  Temp: 98.5 F (36.9 C)  TempSrc: Oral  SpO2: 100%  Weight: 226 lb 8 oz (102.7 kg)  Height: 5\' 6"  (1.676 m)      General appearance: alert, cooperative and appears stated age Resp: clear to auscultation bilaterally and normal percussion bilaterally Back: symmetric, no curvature. ROM normal. No CVA tenderness. Cardio: regular rate and rhythm, S1, S2 normal, no murmur, click, rub or gallop GI: soft, non-tender; bowel sounds normal; no masses,  no organomegaly Extremities: extremities normal, atraumatic, no cyanosis or edema Neurologic: Alert and oriented X 3, normal strength and tone. Normal symmetric reflexes. Normal coordination and gait  ECOG PERFORMANCE STATUS: 1 - Symptomatic but completely ambulatory  LABORATORY DATA: CBC Latest Ref Rng & Units 07/17/2017 06/19/2017 02/15/2017  WBC 3.9 - 10.3 K/uL 6.0 6.0 4.7   Hemoglobin 11.6 - 15.9 g/dL - 12.6 12.2  Hematocrit 34.8 - 46.6 % 38.1 39.3 38.5  Platelets 145 - 400 K/uL 235 279 218    CMP Latest Ref Rng & Units 07/17/2017 06/19/2017 02/15/2017  Glucose 70 - 140 mg/dL 165(H) 173(H) 180(H)  BUN 7 - 26 mg/dL 12 12 13.5  Creatinine 0.60 - 1.10 mg/dL 0.81 0.87 0.9  Sodium 136 - 145 mmol/L 141 142 142  Potassium 3.5 - 5.1 mmol/L 3.5 3.4(L) 4.3  Chloride 98 - 109 mmol/L 104 105 -  CO2 22 - 29  mmol/L 28 25 27   Calcium 8.4 - 10.4 mg/dL 9.4 9.8 9.2  Total Protein 6.4 - 8.3 g/dL 6.8 7.2 7.0  Total Bilirubin 0.2 - 1.2 mg/dL 0.4 0.7 0.64  Alkaline Phos 40 - 150 U/L 82 101 92  AST 5 - 34 U/L 25 23 14   ALT 0 - 55 U/L 38 36 9    Results for VICKTORIA, MUCKEY (MRN [id number]) as of 11/16/2016 10:53  Ref. Range 04/05/2016 10:23 06/28/2016 10:17 11/16/2016 09:19  Iron Latest Ref Range: 41 - 142 ug/dL 92 137 98  UIBC Latest Ref Range: 120 - 384 ug/dL 162 133 158  TIBC Latest Ref Range: 236 - 444 ug/dL 254 270 256  %SAT Latest Ref Range: 21 - 57 % 36 51 38  Ferritin Latest Ref Range: 9 - 269 ng/ml 163 184 146      Results for BIRDELL, FRASIER (MRN [id number]) as of 11/11/2016 11:25  Ref. Range 03/17/2014 11:20 07/20/2015 10:48 01/12/2016 10:18  Vitamin B12 Latest Ref Range: 211 - 946 pg/mL 632 874 608      RADIOGRAPHIC STUDIES:  Mammogram 02/12/2016: IMPRESSION: No mammographic evidence of malignancy. A result letter of this screening mammogram will be mailed directly to the patient.  RECOMMENDATION: Screening mammogram in one year. (Code:[id number]-B-01Y) .  ASSESSMENT:   62 y.o.  female s/p gastric bypass surgery  1. Anemia secondary to B12 deficiency and iron deficiency, secondary to gastric bypass surgery -Continue monthly B12 injection, her last B12 level has been normal  -She is doing well, hemoglobin 12.6 previously, repeat iron level was in good normal range on 10/2016. -We'll continue to monitor her CBC and iron studies every 3-4 months -She has  not required IV iron much, last infusion in 04/2015 -her iron study last month is normal, and her B12 level has been normal therefore her blood counts are not causing her fatigue. She could have a different vitamin level deficiency.I will check today for Vit A, Vit B1 and B6, Vit K, Vit E, and Copper deficiency  -I encouraged her to exercise more frequently to help increase her energy level.   -Continue with monthly B 12 injections -F/u in 8 months    2. Arthritis, DM, depression insomnia -She'll continue follow-up with her primary care physician -She will also follow-up with her pain specialist for her arthralgia and pain management  -She takes lexapro for her depression. I advised her that this can cause fatigue. She states she is starting to decrease her dose -She takes synthroid and states her thyroid level is checked regularly -She has lost weight 10lbs in the past year. She states she takes antivert for DM and this can cause some weight loss  3. Severe fatigue  -She complains severe fatigue, not able to do routine daily activities, and sleeps a lot. -Due to her previous gastric bypass surgery, I will check vitamin levels to see if she has any deficiency -Her cancer screening has been up-to-date, no particular signs were worrisome for malignancy at this point -She will continue follow-up with her primary care physician   4. Cancer screening -She up to date with mammogram, last one was Oct 2018 -Her last colonoscopy was in 2012 with 2 polyps, she has an appointment with her GI soon and she will discuss if she needs one. She did have cologuard done last year that was normal.  -Since she is up to date with her screening, I do not think her fatigue/mild weight loss is being caused by  an underlying malignancy   Plan Monthly B12 injection X8 Lab today, test for Vit A, Vit B1 and B6, Vit K, Vit E, Copper deficiency , I will call her to review the lab results F/u in 8 months   This document  serves as a record of services personally performed by Truitt Merle, MD. It was created on her behalf by Theresia Bough, a trained medical scribe. The creation of this record is based on the scribe's personal observations and the provider's statements to them.   I have reviewed the above documentation for accuracy and completeness, and I agree with the above.   Truitt Merle  07/17/2017 2:59 PM

## 2017-07-17 ENCOUNTER — Telehealth: Payer: Self-pay | Admitting: Hematology

## 2017-07-17 ENCOUNTER — Inpatient Hospital Stay: Payer: Medicare HMO | Attending: Hematology

## 2017-07-17 ENCOUNTER — Inpatient Hospital Stay: Payer: Medicare HMO

## 2017-07-17 ENCOUNTER — Inpatient Hospital Stay (HOSPITAL_BASED_OUTPATIENT_CLINIC_OR_DEPARTMENT_OTHER): Payer: Medicare HMO | Admitting: Hematology

## 2017-07-17 ENCOUNTER — Encounter: Payer: Self-pay | Admitting: Hematology

## 2017-07-17 VITALS — BP 135/81 | HR 94 | Temp 98.5°F | Resp 18 | Ht 66.0 in | Wt 226.5 lb

## 2017-07-17 DIAGNOSIS — G4700 Insomnia, unspecified: Secondary | ICD-10-CM | POA: Diagnosis not present

## 2017-07-17 DIAGNOSIS — R5383 Other fatigue: Secondary | ICD-10-CM

## 2017-07-17 DIAGNOSIS — D508 Other iron deficiency anemias: Secondary | ICD-10-CM | POA: Diagnosis not present

## 2017-07-17 DIAGNOSIS — E119 Type 2 diabetes mellitus without complications: Secondary | ICD-10-CM

## 2017-07-17 DIAGNOSIS — D510 Vitamin B12 deficiency anemia due to intrinsic factor deficiency: Secondary | ICD-10-CM

## 2017-07-17 DIAGNOSIS — D6489 Other specified anemias: Secondary | ICD-10-CM

## 2017-07-17 DIAGNOSIS — Z9884 Bariatric surgery status: Secondary | ICD-10-CM

## 2017-07-17 DIAGNOSIS — M1990 Unspecified osteoarthritis, unspecified site: Secondary | ICD-10-CM | POA: Insufficient documentation

## 2017-07-17 DIAGNOSIS — F329 Major depressive disorder, single episode, unspecified: Secondary | ICD-10-CM

## 2017-07-17 DIAGNOSIS — D518 Other vitamin B12 deficiency anemias: Secondary | ICD-10-CM | POA: Insufficient documentation

## 2017-07-17 DIAGNOSIS — D509 Iron deficiency anemia, unspecified: Secondary | ICD-10-CM

## 2017-07-17 LAB — COMPREHENSIVE METABOLIC PANEL
ALT: 38 U/L (ref 0–55)
AST: 25 U/L (ref 5–34)
Albumin: 3.7 g/dL (ref 3.5–5.0)
Alkaline Phosphatase: 82 U/L (ref 40–150)
Anion gap: 9 (ref 3–11)
BILIRUBIN TOTAL: 0.4 mg/dL (ref 0.2–1.2)
BUN: 12 mg/dL (ref 7–26)
CALCIUM: 9.4 mg/dL (ref 8.4–10.4)
CHLORIDE: 104 mmol/L (ref 98–109)
CO2: 28 mmol/L (ref 22–29)
CREATININE: 0.81 mg/dL (ref 0.60–1.10)
Glucose, Bld: 165 mg/dL — ABNORMAL HIGH (ref 70–140)
Potassium: 3.5 mmol/L (ref 3.5–5.1)
Sodium: 141 mmol/L (ref 136–145)
TOTAL PROTEIN: 6.8 g/dL (ref 6.4–8.3)

## 2017-07-17 LAB — CBC WITH DIFFERENTIAL (CANCER CENTER ONLY)
BASOS ABS: 0 10*3/uL (ref 0.0–0.1)
BASOS PCT: 1 %
EOS ABS: 0.1 10*3/uL (ref 0.0–0.5)
EOS PCT: 1 %
HCT: 38.1 % (ref 34.8–46.6)
HEMOGLOBIN: 12.1 g/dL (ref 11.6–15.9)
LYMPHS ABS: 1.9 10*3/uL (ref 0.9–3.3)
Lymphocytes Relative: 31 %
MCH: 31.1 pg (ref 25.1–34.0)
MCHC: 31.8 g/dL (ref 31.5–36.0)
MCV: 97.9 fL (ref 79.5–101.0)
Monocytes Absolute: 0.3 10*3/uL (ref 0.1–0.9)
Monocytes Relative: 5 %
NEUTROS PCT: 62 %
Neutro Abs: 3.7 10*3/uL (ref 1.5–6.5)
PLATELETS: 235 10*3/uL (ref 145–400)
RBC: 3.89 MIL/uL (ref 3.70–5.45)
RDW: 13.3 % (ref 11.2–14.5)
WBC: 6 10*3/uL (ref 3.9–10.3)

## 2017-07-17 LAB — IRON AND TIBC
IRON: 84 ug/dL (ref 41–142)
SATURATION RATIOS: 29 % (ref 21–57)
TIBC: 289 ug/dL (ref 236–444)
UIBC: 205 ug/dL

## 2017-07-17 LAB — RETICULOCYTES
RBC.: 3.89 MIL/uL (ref 3.70–5.45)
RETIC COUNT ABSOLUTE: 73.9 10*3/uL (ref 33.7–90.7)
RETIC CT PCT: 1.9 % (ref 0.7–2.1)

## 2017-07-17 LAB — FERRITIN: FERRITIN: 89 ng/mL (ref 9–269)

## 2017-07-17 MED ORDER — CYANOCOBALAMIN 1000 MCG/ML IJ SOLN
1000.0000 ug | Freq: Once | INTRAMUSCULAR | Status: AC
  Administered 2017-07-17: 1000 ug via INTRAMUSCULAR

## 2017-07-17 NOTE — Telephone Encounter (Signed)
Appointments scheduled AVS/Calendar printed per 3/18 los °

## 2017-07-17 NOTE — Patient Instructions (Signed)

## 2017-07-18 LAB — VITAMIN D 25 HYDROXY (VIT D DEFICIENCY, FRACTURES): Vit D, 25-Hydroxy: 24.5 ng/mL — ABNORMAL LOW (ref 30.0–100.0)

## 2017-07-19 LAB — VITAMIN C: VITAMIN C: 1 mg/dL (ref 0.2–2.0)

## 2017-07-19 LAB — VITAMIN A: VITAMIN A (RETINOIC ACID): 52.5 ug/dL (ref 22.0–69.5)

## 2017-07-19 LAB — VITAMIN E
VITAMIN E(GAMMA TOCOPHEROL): 0.9 mg/L (ref 0.5–4.9)
Vitamin E (Alpha Tocopherol): 11.9 mg/L (ref 9.0–29.0)

## 2017-07-19 LAB — COPPER, SERUM: Copper: 93 ug/dL (ref 72–166)

## 2017-07-19 LAB — VITAMIN B6: VITAMIN B6: 9.6 ug/L (ref 2.0–32.8)

## 2017-07-19 LAB — ZINC: ZINC: 80 ug/dL (ref 56–134)

## 2017-07-19 LAB — VITAMIN B1: VITAMIN B1 (THIAMINE): 105.2 nmol/L (ref 66.5–200.0)

## 2017-07-20 ENCOUNTER — Telehealth: Payer: Self-pay | Admitting: *Deleted

## 2017-07-20 NOTE — Telephone Encounter (Signed)
TCT patient regarding her recent lab work. Spoke with patient and informed her that labs essentially normal with the exception of a slightly low Vit. D. Advised to take 800 units Vit d. Daily. Advised that hat she can get this over the counter. Also, per Dr. Burr Medico, advised to follow up with her PCP regarding her fatigue concerns.  Pt voiced understanding. No other questions or concerns.

## 2017-08-09 DIAGNOSIS — M7540 Impingement syndrome of unspecified shoulder: Secondary | ICD-10-CM | POA: Insufficient documentation

## 2017-08-17 ENCOUNTER — Inpatient Hospital Stay: Payer: Medicare HMO | Attending: Hematology

## 2017-08-17 VITALS — BP 141/88 | HR 87 | Temp 98.9°F | Resp 20

## 2017-08-17 DIAGNOSIS — D518 Other vitamin B12 deficiency anemias: Secondary | ICD-10-CM | POA: Insufficient documentation

## 2017-08-17 DIAGNOSIS — D510 Vitamin B12 deficiency anemia due to intrinsic factor deficiency: Secondary | ICD-10-CM

## 2017-08-17 MED ORDER — CYANOCOBALAMIN 1000 MCG/ML IJ SOLN
1000.0000 ug | Freq: Once | INTRAMUSCULAR | Status: AC
  Administered 2017-08-17: 1000 ug via INTRAMUSCULAR

## 2017-08-17 NOTE — Patient Instructions (Signed)

## 2017-09-05 DIAGNOSIS — M7502 Adhesive capsulitis of left shoulder: Secondary | ICD-10-CM | POA: Insufficient documentation

## 2017-09-15 ENCOUNTER — Inpatient Hospital Stay: Payer: Medicare HMO | Attending: Hematology

## 2017-09-15 VITALS — BP 132/85 | HR 90 | Temp 98.7°F | Resp 18

## 2017-09-15 DIAGNOSIS — D510 Vitamin B12 deficiency anemia due to intrinsic factor deficiency: Secondary | ICD-10-CM

## 2017-09-15 DIAGNOSIS — D518 Other vitamin B12 deficiency anemias: Secondary | ICD-10-CM | POA: Diagnosis not present

## 2017-09-15 MED ORDER — CYANOCOBALAMIN 1000 MCG/ML IJ SOLN
1000.0000 ug | Freq: Once | INTRAMUSCULAR | Status: AC
  Administered 2017-09-15: 1000 ug via INTRAMUSCULAR

## 2017-09-15 NOTE — Patient Instructions (Signed)

## 2017-09-20 ENCOUNTER — Ambulatory Visit (HOSPITAL_COMMUNITY): Payer: Self-pay | Admitting: Psychiatry

## 2017-09-26 MED ORDER — PROCHLORPERAZINE MALEATE 10 MG PO TABS
ORAL_TABLET | ORAL | Status: AC
  Filled 2017-09-26: qty 1

## 2017-10-16 ENCOUNTER — Inpatient Hospital Stay: Payer: Medicare HMO | Attending: Hematology

## 2017-10-16 VITALS — BP 145/88 | HR 90 | Temp 98.4°F | Resp 20

## 2017-10-16 DIAGNOSIS — D518 Other vitamin B12 deficiency anemias: Secondary | ICD-10-CM | POA: Diagnosis present

## 2017-10-16 DIAGNOSIS — D510 Vitamin B12 deficiency anemia due to intrinsic factor deficiency: Secondary | ICD-10-CM

## 2017-10-16 MED ORDER — CYANOCOBALAMIN 1000 MCG/ML IJ SOLN
1000.0000 ug | Freq: Once | INTRAMUSCULAR | Status: AC
  Administered 2017-10-16: 1000 ug via INTRAMUSCULAR

## 2017-10-16 NOTE — Patient Instructions (Signed)

## 2017-11-15 ENCOUNTER — Inpatient Hospital Stay: Payer: Medicare HMO | Attending: Hematology

## 2017-11-15 DIAGNOSIS — D518 Other vitamin B12 deficiency anemias: Secondary | ICD-10-CM | POA: Diagnosis present

## 2017-11-15 DIAGNOSIS — D510 Vitamin B12 deficiency anemia due to intrinsic factor deficiency: Secondary | ICD-10-CM

## 2017-11-15 DIAGNOSIS — D508 Other iron deficiency anemias: Secondary | ICD-10-CM

## 2017-11-15 MED ORDER — CYANOCOBALAMIN 1000 MCG/ML IJ SOLN
1000.0000 ug | Freq: Once | INTRAMUSCULAR | Status: AC
  Administered 2017-11-15: 1000 ug via INTRAMUSCULAR

## 2017-11-15 MED ORDER — CYANOCOBALAMIN 1000 MCG/ML IJ SOLN
INTRAMUSCULAR | Status: AC
  Filled 2017-11-15: qty 1

## 2017-12-15 ENCOUNTER — Inpatient Hospital Stay: Payer: Medicare HMO | Attending: Hematology

## 2017-12-15 VITALS — BP 127/71 | HR 73 | Temp 98.1°F | Resp 18

## 2017-12-15 DIAGNOSIS — D510 Vitamin B12 deficiency anemia due to intrinsic factor deficiency: Secondary | ICD-10-CM

## 2017-12-15 DIAGNOSIS — D518 Other vitamin B12 deficiency anemias: Secondary | ICD-10-CM | POA: Insufficient documentation

## 2017-12-15 MED ORDER — CYANOCOBALAMIN 1000 MCG/ML IJ SOLN
INTRAMUSCULAR | Status: AC
  Filled 2017-12-15: qty 1

## 2017-12-15 MED ORDER — CYANOCOBALAMIN 1000 MCG/ML IJ SOLN
1000.0000 ug | Freq: Once | INTRAMUSCULAR | Status: AC
  Administered 2017-12-15: 1000 ug via INTRAMUSCULAR

## 2017-12-25 MED ORDER — PROCHLORPERAZINE MALEATE 10 MG PO TABS
ORAL_TABLET | ORAL | Status: AC
  Filled 2017-12-25: qty 1

## 2018-01-08 ENCOUNTER — Telehealth: Payer: Self-pay | Admitting: Hematology

## 2018-01-08 NOTE — Telephone Encounter (Signed)
Patient called to reschedule  °

## 2018-01-15 ENCOUNTER — Ambulatory Visit: Payer: Self-pay

## 2018-01-15 ENCOUNTER — Inpatient Hospital Stay: Payer: Medicare HMO | Attending: Hematology

## 2018-01-15 VITALS — BP 107/83 | HR 81 | Temp 98.9°F | Resp 18

## 2018-01-15 DIAGNOSIS — D510 Vitamin B12 deficiency anemia due to intrinsic factor deficiency: Secondary | ICD-10-CM

## 2018-01-15 DIAGNOSIS — D518 Other vitamin B12 deficiency anemias: Secondary | ICD-10-CM | POA: Insufficient documentation

## 2018-01-15 MED ORDER — CYANOCOBALAMIN 1000 MCG/ML IJ SOLN
INTRAMUSCULAR | Status: AC
  Filled 2018-01-15: qty 1

## 2018-01-15 MED ORDER — CYANOCOBALAMIN 1000 MCG/ML IJ SOLN
1000.0000 ug | Freq: Once | INTRAMUSCULAR | Status: AC
  Administered 2018-01-15: 1000 ug via INTRAMUSCULAR

## 2018-02-14 ENCOUNTER — Inpatient Hospital Stay: Payer: Medicare HMO | Attending: Hematology

## 2018-02-14 VITALS — BP 118/75 | HR 86 | Temp 99.2°F | Resp 18

## 2018-02-14 DIAGNOSIS — D519 Vitamin B12 deficiency anemia, unspecified: Secondary | ICD-10-CM | POA: Diagnosis present

## 2018-02-14 DIAGNOSIS — D510 Vitamin B12 deficiency anemia due to intrinsic factor deficiency: Secondary | ICD-10-CM

## 2018-02-14 MED ORDER — CYANOCOBALAMIN 1000 MCG/ML IJ SOLN
1000.0000 ug | Freq: Once | INTRAMUSCULAR | Status: AC
  Administered 2018-02-14: 1000 ug via INTRAMUSCULAR

## 2018-02-14 MED ORDER — CYANOCOBALAMIN 1000 MCG/ML IJ SOLN
INTRAMUSCULAR | Status: AC
  Filled 2018-02-14: qty 1

## 2018-03-09 ENCOUNTER — Other Ambulatory Visit: Payer: Self-pay | Admitting: Family Medicine

## 2018-03-09 DIAGNOSIS — Z1231 Encounter for screening mammogram for malignant neoplasm of breast: Secondary | ICD-10-CM

## 2018-03-14 ENCOUNTER — Telehealth: Payer: Self-pay | Admitting: Hematology

## 2018-03-14 NOTE — Telephone Encounter (Signed)
Tried to call I did leave a message to return call

## 2018-03-16 ENCOUNTER — Ambulatory Visit: Payer: Self-pay | Admitting: Hematology

## 2018-03-16 ENCOUNTER — Ambulatory Visit: Payer: Self-pay

## 2018-03-24 NOTE — Progress Notes (Signed)
[place]  [phone] [phone]  Clinic Follow up Note   Patient Care Team: [name], MD as PCP - General (Family Medicine) 03/27/2018  Chief Complaint: F/u on anemia  INTERVAL HISTORY: Kayla Mayo is a 62 y.o. female who is here for follow-up. She was last seen by me 7 months ago. She is here today with her family member. She is doing well, but feels tired all the time. She received a B12 injection once a month. She would like to have the injections at home. She is not on iron.   Pertinent positives and negatives of review of systems are listed and detailed within the above HPI.   REVIEW OF SYSTEMS:   Constitutional: Denies fevers, chills or abnormal weight loss (+) fatigue Eyes: Denies blurriness of vision Ears, nose, mouth, throat, and face: Denies mucositis or sore throat Respiratory: Denies cough, dyspnea or wheezes Cardiovascular: Denies palpitation, chest discomfort or lower extremity swelling Gastrointestinal:  Denies nausea, heartburn or change in bowel habits Skin: Denies abnormal skin rashes Lymphatics: Denies new lymphadenopathy or easy bruising Neurological:Denies numbness, tingling or new weaknesses Behavioral/Psych: Mood is stable, no new changes  All other systems were reviewed with the patient and are negative.  MEDICAL HISTORY:  Past Medical History:  Diagnosis Date  . Anal fissure   . Anemia 03/17/2011  . Anxiety   . Arthritis   . Breast cancer ([place])   . Bursitis of hip   . Chronic headaches   . Colon polyps   . Depression   . Diabetes mellitus   . Esophageal spasm   . Gallstones   . GERD (gastroesophageal reflux disease)   . Hyperlipidemia   . Hypertension   . IBS (irritable bowel syndrome)   . Neuropathy   . Obesity   . Osteopenia 07/2011   t score -1.5 FRAX 2.1%/0%  . Status post cardiac catheterization   . Vertigo    Occurs 6 times a month    SURGICAL HISTORY: Past Surgical History:   Procedure Laterality Date  . APPENDECTOMY    . bilareral knee scopes    . CARPAL TUNNEL RELEASE    . CHOLECYSTECTOMY    . GASTRIC BYPASS    . left shouler surgery    . lower back surgery    . TUBAL LIGATION      I have reviewed the social history and family history with the patient and they are unchanged from previous note.  ALLERGIES:  is allergic to chicken allergy; anesthetics, amide; morphine and related; codeine; corn-containing products; other; peanut-containing drug products; sesame oil; simvastatin; wheat; and lisinopril.  MEDICATIONS:  Current Outpatient Medications  Medication Sig Dispense Refill  . ACCU-CHEK FASTCLIX LANCETS MISC Use daily as directed for Dx: 250.00    . aspirin 81 MG tablet Take 81 mg by mouth daily.    . clobetasol ointment (TEMOVATE) 0.05 % 2 (two) times daily. On scalp bid  2  . cyanocobalamin (,VITAMIN B-12,) 1000 MCG/ML injection Inject 1,000 mcg into the muscle every 30 (thirty) days.      . cyclobenzaprine (FLEXERIL) 10 MG tablet Take 10 mg by mouth 3 (three) times daily as needed.    . diclofenac sodium (VOLTAREN) 1 % GEL APPLY 2g 4 times a day applied topically 30 day(s)    . fluticasone (FLONASE) 50 MCG/ACT nasal spray Place 2 sprays into both nostrils 2 (two) times daily.    . furosemide (LASIX) 20 MG tablet Take 1 tablet (20 mg  total) by mouth 2 (two) times daily. 180 tablet 3  . gabapentin (NEURONTIN) 600 MG tablet Take 600 mg by mouth 5 (five) times daily.     Marland Kitchen HYDROcodone-acetaminophen (NORCO) 10-325 MG tablet Take 1 tablet by mouth every 6 (six) hours as needed.  0  . hydrocortisone (PROCTOSOL HC) 2.5 % rectal cream Place 1 application rectally as needed. 30 g 0  . levothyroxine (SYNTHROID, LEVOTHROID) 25 MCG tablet Take 25 mcg by mouth.    . Linaclotide (LINZESS) 290 MCG CAPS capsule Take 1 capsule by mouth daily.    . meclizine (ANTIVERT) 25 MG tablet Take 25 mg by mouth 3 (three) times daily as needed.      . metFORMIN (GLUCOPHAGE)  850 MG tablet Take 850 mg by mouth 2 (two) times daily with a meal.    . naproxen (NAPROSYN) 500 MG tablet Take 1 tablet (500 mg total) by mouth 2 (two) times daily with a meal. 20 tablet 0  . polyethylene glycol powder (GLYCOLAX/MIRALAX) powder Take 17 g by mouth 2 (two) times daily. 255 g 0  . rosuvastatin (CRESTOR) 10 MG tablet Take 10 mg by mouth daily.     Marland Kitchen telmisartan (MICARDIS) 20 MG tablet Take 1 tablet by mouth daily.    . verapamil (CALAN) 80 MG tablet Take 1 tablet (80 mg total) by mouth daily. 90 tablet 3  . Cyanocobalamin 1000 MCG/ML KIT Inject 1,000 mcg as directed every 30 (thirty) days. 1 kit 5   No current facility-administered medications for this visit.    Facility-Administered Medications Ordered in Other Visits  Medication Dose Route Frequency Provider Last Rate Last Dose  . cyanocobalamin ((VITAMIN B-12)) injection 1,000 mcg  1,000 mcg Intramuscular Q28 days Marcy Panning, MD        PHYSICAL EXAMINATION: ECOG PERFORMANCE STATUS: 1 - Symptomatic but completely ambulatory  Vitals:   03/27/18 1038  BP: (!) 149/93  Pulse: 73  Resp: 17  Temp: 98.8 F (37.1 C)  SpO2: 100%   Filed Weights   03/27/18 1038  Weight: 203 lb 6.4 oz (92.3 kg)    GENERAL:alert, no distress and comfortable SKIN: skin color, texture, turgor are normal, no rashes or significant lesions EYES: normal, Conjunctiva are pink and non-injected, sclera clear OROPHARYNX:no exudate, no erythema and lips, buccal mucosa, and tongue normal  NECK: supple, thyroid normal size, non-tender, without nodularity LYMPH:  no palpable lymphadenopathy in the cervical, axillary or inguinal LUNGS: clear to auscultation and percussion with normal breathing effort HEART: regular rate & rhythm and no murmurs and no lower extremity edema ABDOMEN:abdomen soft, non-tender and normal bowel sounds Musculoskeletal:no cyanosis of digits and no clubbing  NEURO: alert & oriented x 3 with fluent speech, no focal  motor/sensory deficits  LABORATORY DATA:  I have reviewed the data as listed CBC Latest Ref Rng & Units 03/27/2018 07/17/2017 06/19/2017  WBC 4.0 - 10.5 K/uL 5.3 6.0 6.0  Hemoglobin 12.0 - 15.0 g/dL 11.7(L) 12.1 12.6  Hematocrit 36.0 - 46.0 % 37.2 38.1 39.3  Platelets 150 - 400 K/uL 273 235 279     CMP Latest Ref Rng & Units 03/27/2018 07/17/2017 06/19/2017  Glucose 70 - 99 mg/dL 126(H) 165(H) 173(H)  BUN 8 - 23 mg/dL '12 12 12  '$ Creatinine 0.44 - 1.00 mg/dL 0.78 0.81 0.87  Sodium 135 - 145 mmol/L 143 141 142  Potassium 3.5 - 5.1 mmol/L 3.4(L) 3.5 3.4(L)  Chloride 98 - 111 mmol/L 105 104 105  CO2 22 - 32 mmol/L 28  28 25  Calcium 8.9 - 10.3 mg/dL 9.4 9.4 9.8  Total Protein 6.5 - 8.1 g/dL 7.3 6.8 7.2  Total Bilirubin 0.3 - 1.2 mg/dL 1.0 0.4 0.7  Alkaline Phos 38 - 126 U/L 87 82 101  AST 15 - 41 U/L '17 25 23  '$ ALT 0 - 44 U/L 17 38 36      RADIOGRAPHIC STUDIES: I have personally reviewed the radiological images as listed and agreed with the findings in the report. No results found.   ASSESSMENT & PLAN: MEYA CLUTTER is a 62 y.o. female with history of  1. Anemia secondary to B12 deficiency and iron deficiency, secondary to gastric bypass surgery - She received monthly vitamin B 12 injections. Last IV iron infusion was in 04/2015. -Labs reviewed, CBC showed Hg 11.7. CMP is unremarkable except a mild hypokalemia.  -Ferritin today 81, serum iron and saturation are normal.  No need IV iron -She received B12 injections every month in our clinic, and would like to start taking them at home. I called in B12 to her pharmacy today  -She is not on iron pills now.  -f/u and lab in 6 months, will consider iv iron if ferrtin<50  2. Arthritis, DM, depression, insomnia, fatigue -f/u with PCP  Plan  -I called in Vitamin B12 injections for her to take at home -f/u in 6 months with labs  -no need iv iron for now     No problem-specific Assessment & Plan notes found for this  encounter.   No orders of the defined types were placed in this encounter.  All questions were answered. The patient knows to call the clinic with any problems, questions or concerns. No barriers to learning was detected. I spent 10 minutes counseling the patient face to face. The total time spent in the appointment was 15 minutes and more than 50% was on counseling and review of test results  I, Noor Dweik am acting as scribe for Dr. Truitt Merle.  I have reviewed the above documentation for accuracy and completeness, and I agree with the above.      Truitt Merle, MD 03/27/2018

## 2018-03-25 ENCOUNTER — Other Ambulatory Visit: Payer: Self-pay | Admitting: Hematology

## 2018-03-25 DIAGNOSIS — E559 Vitamin D deficiency, unspecified: Secondary | ICD-10-CM

## 2018-03-27 ENCOUNTER — Inpatient Hospital Stay: Payer: Medicare HMO

## 2018-03-27 ENCOUNTER — Telehealth: Payer: Self-pay

## 2018-03-27 ENCOUNTER — Encounter: Payer: Self-pay | Admitting: Hematology

## 2018-03-27 ENCOUNTER — Inpatient Hospital Stay: Payer: Medicare HMO | Attending: Hematology | Admitting: Hematology

## 2018-03-27 VITALS — BP 149/93 | HR 73 | Temp 98.8°F | Resp 17 | Ht 66.0 in | Wt 203.4 lb

## 2018-03-27 DIAGNOSIS — Z9484 Stem cells transplant status: Secondary | ICD-10-CM

## 2018-03-27 DIAGNOSIS — M1990 Unspecified osteoarthritis, unspecified site: Secondary | ICD-10-CM | POA: Diagnosis not present

## 2018-03-27 DIAGNOSIS — D519 Vitamin B12 deficiency anemia, unspecified: Secondary | ICD-10-CM

## 2018-03-27 DIAGNOSIS — I10 Essential (primary) hypertension: Secondary | ICD-10-CM

## 2018-03-27 DIAGNOSIS — E119 Type 2 diabetes mellitus without complications: Secondary | ICD-10-CM | POA: Diagnosis not present

## 2018-03-27 DIAGNOSIS — D510 Vitamin B12 deficiency anemia due to intrinsic factor deficiency: Secondary | ICD-10-CM

## 2018-03-27 DIAGNOSIS — G4700 Insomnia, unspecified: Secondary | ICD-10-CM

## 2018-03-27 DIAGNOSIS — D509 Iron deficiency anemia, unspecified: Secondary | ICD-10-CM | POA: Diagnosis not present

## 2018-03-27 DIAGNOSIS — D6489 Other specified anemias: Secondary | ICD-10-CM

## 2018-03-27 DIAGNOSIS — R5383 Other fatigue: Secondary | ICD-10-CM

## 2018-03-27 DIAGNOSIS — D518 Other vitamin B12 deficiency anemias: Secondary | ICD-10-CM

## 2018-03-27 DIAGNOSIS — E559 Vitamin D deficiency, unspecified: Secondary | ICD-10-CM

## 2018-03-27 LAB — CBC WITH DIFFERENTIAL (CANCER CENTER ONLY)
Abs Immature Granulocytes: 0.01 10*3/uL (ref 0.00–0.07)
BASOS ABS: 0.1 10*3/uL (ref 0.0–0.1)
Basophils Relative: 1 %
EOS ABS: 0.3 10*3/uL (ref 0.0–0.5)
EOS PCT: 6 %
HEMATOCRIT: 37.2 % (ref 36.0–46.0)
Hemoglobin: 11.7 g/dL — ABNORMAL LOW (ref 12.0–15.0)
Immature Granulocytes: 0 %
Lymphocytes Relative: 32 %
Lymphs Abs: 1.7 10*3/uL (ref 0.7–4.0)
MCH: 30.2 pg (ref 26.0–34.0)
MCHC: 31.5 g/dL (ref 30.0–36.0)
MCV: 96.1 fL (ref 80.0–100.0)
Monocytes Absolute: 0.3 10*3/uL (ref 0.1–1.0)
Monocytes Relative: 5 %
NRBC: 0 % (ref 0.0–0.2)
Neutro Abs: 3 10*3/uL (ref 1.7–7.7)
Neutrophils Relative %: 56 %
Platelet Count: 273 10*3/uL (ref 150–400)
RBC: 3.87 MIL/uL (ref 3.87–5.11)
RDW: 13.8 % (ref 11.5–15.5)
WBC Count: 5.3 10*3/uL (ref 4.0–10.5)

## 2018-03-27 LAB — RETICULOCYTES
Immature Retic Fract: 10 % (ref 2.3–15.9)
RBC.: 3.87 MIL/uL (ref 3.87–5.11)
RETIC CT PCT: 2 % (ref 0.4–3.1)
Retic Count, Absolute: 76.6 10*3/uL (ref 19.0–186.0)

## 2018-03-27 LAB — COMPREHENSIVE METABOLIC PANEL
ALK PHOS: 87 U/L (ref 38–126)
ALT: 17 U/L (ref 0–44)
AST: 17 U/L (ref 15–41)
Albumin: 4.1 g/dL (ref 3.5–5.0)
Anion gap: 10 (ref 5–15)
BILIRUBIN TOTAL: 1 mg/dL (ref 0.3–1.2)
BUN: 12 mg/dL (ref 8–23)
CALCIUM: 9.4 mg/dL (ref 8.9–10.3)
CO2: 28 mmol/L (ref 22–32)
CREATININE: 0.78 mg/dL (ref 0.44–1.00)
Chloride: 105 mmol/L (ref 98–111)
GFR calc non Af Amer: >60 mL/min (ref >60)
Glucose, Bld: 126 mg/dL — ABNORMAL HIGH (ref 70–99)
Potassium: 3.4 mmol/L — ABNORMAL LOW (ref 3.5–5.1)
Sodium: 143 mmol/L (ref 135–145)
Total Protein: 7.3 g/dL (ref 6.5–8.1)

## 2018-03-27 LAB — IRON AND TIBC
IRON: 111 ug/dL (ref 41–142)
SATURATION RATIOS: 36 % (ref 21–57)
TIBC: 306 ug/dL (ref 236–444)
UIBC: 195 ug/dL (ref 120–384)

## 2018-03-27 LAB — FERRITIN: Ferritin: 81 ng/mL (ref 11–307)

## 2018-03-27 MED ORDER — CYANOCOBALAMIN 1000 MCG/ML IJ SOLN
1000.0000 ug | Freq: Once | INTRAMUSCULAR | Status: AC
  Administered 2018-03-27: 1000 ug via INTRAMUSCULAR

## 2018-03-27 MED ORDER — CYANOCOBALAMIN 1000 MCG/ML IJ KIT: 1000.0000 ug | PACK | INTRAMUSCULAR | 5 refills | Status: DC

## 2018-03-27 MED ORDER — CYANOCOBALAMIN 1000 MCG/ML IJ SOLN
INTRAMUSCULAR | Status: AC
  Filled 2018-03-27: qty 1

## 2018-03-27 NOTE — Telephone Encounter (Signed)
Printd avs and calender of upcoming appointment. Per 11/26 los

## 2018-03-27 NOTE — Patient Instructions (Signed)
Cyanocobalamin, Vitamin B12 injection What is this medicine? CYANOCOBALAMIN (sye an oh koe BAL a min) is a man made form of vitamin B12. Vitamin B12 is used in the growth of healthy blood cells, nerve cells, and proteins in the body. It also helps with the metabolism of fats and carbohydrates. This medicine is used to treat people who can not absorb vitamin B12. This medicine may be used for other purposes; ask your health care provider or pharmacist if you have questions. COMMON BRAND NAME(S): B-12 Compliance Kit, B-12 Injection Kit, Cyomin, LA-12, Nutri-Twelve, Physicians EZ Use B-12, Primabalt What should I tell my health care provider before I take this medicine? They need to know if you have any of these conditions: -kidney disease -Leber's disease -megaloblastic anemia -an unusual or allergic reaction to cyanocobalamin, cobalt, other medicines, foods, dyes, or preservatives -pregnant or trying to get pregnant -breast-feeding How should I use this medicine? This medicine is injected into a muscle or deeply under the skin. It is usually given by a health care professional in a clinic or doctor's office. However, your doctor may teach you how to inject yourself. Follow all instructions. Talk to your pediatrician regarding the use of this medicine in children. Special care may be needed. Overdosage: If you think you have taken too much of this medicine contact a poison control center or emergency room at once. NOTE: This medicine is only for you. Do not share this medicine with others. What if I miss a dose? If you are given your dose at a clinic or doctor's office, call to reschedule your appointment. If you give your own injections and you miss a dose, take it as soon as you can. If it is almost time for your next dose, take only that dose. Do not take double or extra doses. What may interact with this medicine? -colchicine -heavy alcohol intake This list may not describe all possible  interactions. Give your health care provider a list of all the medicines, herbs, non-prescription drugs, or dietary supplements you use. Also tell them if you smoke, drink alcohol, or use illegal drugs. Some items may interact with your medicine. What should I watch for while using this medicine? Visit your doctor or health care professional regularly. You may need blood work done while you are taking this medicine. You may need to follow a special diet. Talk to your doctor. Limit your alcohol intake and avoid smoking to get the best benefit. What side effects may I notice from receiving this medicine? Side effects that you should report to your doctor or health care professional as soon as possible: -allergic reactions like skin rash, itching or hives, swelling of the face, lips, or tongue -blue tint to skin -chest tightness, pain -difficulty breathing, wheezing -dizziness -red, swollen painful area on the leg Side effects that usually do not require medical attention (report to your doctor or health care professional if they continue or are bothersome): -diarrhea -headache This list may not describe all possible side effects. Call your doctor for medical advice about side effects. You may report side effects to FDA at 1-800-FDA-1088. Where should I keep my medicine? Keep out of the reach of children. Store at room temperature between 15 and 30 degrees C (59 and 85 degrees F). Protect from light. Throw away any unused medicine after the expiration date. NOTE: This sheet is a summary. It may not cover all possible information. If you have questions about this medicine, talk to your doctor, pharmacist, or   health care provider.  2018 Elsevier/Gold Standard (2007-07-30 22:10:20)  

## 2018-03-28 LAB — VITAMIN D 25 HYDROXY (VIT D DEFICIENCY, FRACTURES): Vit D, 25-Hydroxy: 35.5 ng/mL (ref 30.0–100.0)

## 2018-03-30 ENCOUNTER — Telehealth: Payer: Self-pay

## 2018-03-30 NOTE — Telephone Encounter (Signed)
Received fax from [facility] regarding B12, no kit available but can supply B12 in a vial and syringes for home administration.  Faxed back with ok

## 2018-03-30 NOTE — Telephone Encounter (Signed)
Spoke with patient per Dr. Burr Medico regarding lab results, iron level is adequate, no need for iv iron for now, vitamin D level is good.  [facility] has not called her that her script for B12 is ready yet. But as soon as it is she will pick it up.  Explained to her we received a fax stating that do not have the kit, but can supply a vial and syringes.  Patient verbalized an understanding.

## 2018-03-30 NOTE — Telephone Encounter (Signed)
-----   Message from Truitt Merle, MD sent at 03/30/2018 12:31 AM EST ----- Please let pt know her lab results, iron level adequate, no need iv iron for now, vitD level is also good. Please check with her if she has picked up vitB injections from her pharmacy and se will do the injections at home. Thanks  Truitt Merle  03/30/2018

## 2018-04-20 ENCOUNTER — Ambulatory Visit: Payer: Self-pay

## 2018-05-25 ENCOUNTER — Ambulatory Visit
Admission: RE | Admit: 2018-05-25 | Discharge: 2018-05-25 | Disposition: A | Discharge status: Home / Self Care | Payer: Medicare HMO | Source: Ambulatory Visit | Attending: Family Medicine | Admitting: Family Medicine

## 2018-05-25 DIAGNOSIS — Z1231 Encounter for screening mammogram for malignant neoplasm of breast: Secondary | ICD-10-CM

## 2018-08-22 DIAGNOSIS — M170 Bilateral primary osteoarthritis of knee: Secondary | ICD-10-CM | POA: Insufficient documentation

## 2018-09-21 NOTE — Progress Notes (Signed)
[place]   Telephone:(336) [phone] Fax:(336) [phone]   Clinic Follow up Note   Patient Care Team: [name], MD as PCP - General (Family Medicine)  Date of Service:  09/26/2018  CHIEF COMPLAINT: F/u on anemia  CURRENT THERAPY:  -Monthly vitamin B12 injections, now at home  -IV Feraheme as needed   INTERVAL HISTORY:  Kayla Mayo is here for a follow up of anemia. She is here alone. She notes she is doing well. She notes she has no new changes. She notes she has been taking B12 injection monthly at home with no issues. She notes she feels stable, no increase in fatigue. She is able to do activities she wants with no difficulty. She has lost about 10 pounds in 6 months, not on purpose. She notes she was stressed and did not eat much when her husband was out of work. That was 2 months ago. With him back to work she is less stressed and her appetite is improving, weight is stable. She denies chest pain and notes normal BMs. She does not take oral iron due to poor absorption.  She notes she did Colloguard in 2019 which was negative. She notes she longer does Pap Smears. I reviewed her medication list. She saw her PCP and arthritis physician last month.     REVIEW OF SYSTEMS:   Constitutional: Denies fevers, chills (+) recent weight loss due to stress  Eyes: Denies blurriness of vision Ears, nose, mouth, throat, and face: Denies mucositis or sore throat Respiratory: Denies cough, dyspnea or wheezes Cardiovascular: Denies palpitation, chest discomfort or lower extremity swelling Gastrointestinal:  Denies nausea, heartburn or change in bowel habits Skin: Denies abnormal skin rashes Lymphatics: Denies new lymphadenopathy or easy bruising Neurological:Denies numbness, tingling or new weaknesses Behavioral/Psych: Mood is stable, no new changes  All other systems were reviewed with the patient and are negative.  MEDICAL HISTORY:  Past Medical History:  Diagnosis  Date  . Anal fissure   . Anemia 03/17/2011  . Anxiety   . Arthritis   . Breast cancer ([place])   . Bursitis of hip   . Chronic headaches   . Colon polyps   . Depression   . Diabetes mellitus   . Esophageal spasm   . Gallstones   . GERD (gastroesophageal reflux disease)   . Hyperlipidemia   . Hypertension   . IBS (irritable bowel syndrome)   . Neuropathy   . Obesity   . Osteopenia 07/2011   t score -1.5 FRAX 2.1%/0%  . Status post cardiac catheterization   . Vertigo    Occurs 6 times a month    SURGICAL HISTORY: Past Surgical History:  Procedure Laterality Date  . APPENDECTOMY    . bilareral knee scopes    . CARPAL TUNNEL RELEASE    . CHOLECYSTECTOMY    . GASTRIC BYPASS    . left shouler surgery    . lower back surgery    . TUBAL LIGATION      I have reviewed the social history and family history with the patient and they are unchanged from previous note.  ALLERGIES:  is allergic to chicken allergy; anesthetics, amide; morphine and related; codeine; corn-containing products; other; peanut-containing drug products; sesame oil; simvastatin; wheat; and lisinopril.  MEDICATIONS:  Current Outpatient Medications  Medication Sig Dispense Refill  . ACCU-CHEK FASTCLIX LANCETS MISC Use daily as directed for Dx: 250.00    . clobetasol ointment (TEMOVATE) 0.05 % 2 (two) times daily. On  scalp bid  2  . cyanocobalamin (,VITAMIN B-12,) 1000 MCG/ML injection Inject 1,000 mcg into the muscle every 30 (thirty) days.      . Cyanocobalamin 1000 MCG/ML KIT Inject 1,000 mcg as directed every 30 (thirty) days. 1 kit 5  . cyclobenzaprine (FLEXERIL) 10 MG tablet Take 10 mg by mouth 3 (three) times daily as needed.    . diclofenac sodium (VOLTAREN) 1 % GEL APPLY 2g 4 times a day applied topically 30 day(s)    . fluticasone (FLONASE) 50 MCG/ACT nasal spray Place 2 sprays into both nostrils 2 (two) times daily.    . furosemide (LASIX) 20 MG tablet Take 1 tablet (20 mg total) by mouth 2 (two)  times daily. 180 tablet 3  . gabapentin (NEURONTIN) 600 MG tablet Take 600 mg by mouth 5 (five) times daily.     Marland Kitchen HYDROcodone-acetaminophen (NORCO) 10-325 MG tablet Take 1 tablet by mouth every 6 (six) hours as needed.  0  . hydrocortisone (PROCTOSOL HC) 2.5 % rectal cream Place 1 application rectally as needed. 30 g 0  . levothyroxine (SYNTHROID, LEVOTHROID) 25 MCG tablet Take 25 mcg by mouth.    . Linaclotide (LINZESS) 290 MCG CAPS capsule Take 1 capsule by mouth daily.    . meclizine (ANTIVERT) 25 MG tablet Take 25 mg by mouth 3 (three) times daily as needed.      . metFORMIN (GLUCOPHAGE) 850 MG tablet Take 850 mg by mouth 2 (two) times daily with a meal.    . naproxen (NAPROSYN) 500 MG tablet Take 1 tablet (500 mg total) by mouth 2 (two) times daily with a meal. 20 tablet 0  . polyethylene glycol powder (GLYCOLAX/MIRALAX) powder Take 17 g by mouth 2 (two) times daily. 255 g 0  . rosuvastatin (CRESTOR) 10 MG tablet Take 10 mg by mouth daily.     . potassium chloride SA (K-DUR) 20 MEQ tablet Take 1 tablet (20 mEq total) by mouth 2 (two) times daily. 40 tablet 1  . Syringe, Disposable, (2-3CC SYRINGE) 3 ML MISC 25 Syringes by Does not apply route every 30 (thirty) days. 1 each 0   No current facility-administered medications for this visit.    Facility-Administered Medications Ordered in Other Visits  Medication Dose Route Frequency Provider Last Rate Last Dose  . cyanocobalamin ((VITAMIN B-12)) injection 1,000 mcg  1,000 mcg Intramuscular Q28 days Marcy Panning, MD        PHYSICAL EXAMINATION: ECOG PERFORMANCE STATUS: 0 - Asymptomatic  Vitals:   09/26/18 1104  BP: 128/86  Pulse: 76  Resp: 18  Temp: 98.2 F (36.8 C)  SpO2: 100%   Filed Weights   09/26/18 1104  Weight: 192 lb 11.2 oz (87.4 kg)    GENERAL:alert, no distress and comfortable SKIN: skin color, texture, turgor are normal, no rashes or significant lesions EYES: normal, Conjunctiva are pink and non-injected, sclera  clear  NECK: supple, thyroid normal size, non-tender, without nodularity LYMPH:  no palpable lymphadenopathy in the cervical, axillary  LUNGS: clear to auscultation and percussion with normal breathing effort HEART: regular rate & rhythm and no murmurs and no lower extremity edema ABDOMEN:abdomen soft, non-tender and normal bowel sounds Musculoskeletal:no cyanosis of digits and no clubbing  NEURO: alert & oriented x 3 with fluent speech, no focal motor/sensory deficits  LABORATORY DATA:  I have reviewed the data as listed CBC Latest Ref Rng & Units 09/26/2018 03/27/2018 07/17/2017  WBC 4.0 - 10.5 K/uL 3.9(L) 5.3 6.0  Hemoglobin 12.0 - 15.0 g/dL  11.5(L) 11.7(L) 12.1  Hematocrit 36.0 - 46.0 % 36.5 37.2 38.1  Platelets 150 - 400 K/uL 240 273 235     CMP Latest Ref Rng & Units 09/26/2018 03/27/2018 07/17/2017  Glucose 70 - 99 mg/dL 134(H) 126(H) 165(H)  BUN 8 - 23 mg/dL _0 Creatinine 0.44 - 1.00 mg/dL 0.85 0.78 0.81  Sodium 135 - 145 mmol/L 142 143 141  Potassium 3.5 - 5.1 mmol/L 2.9(LL) 3.4(L) 3.5  Chloride 98 - 111 mmol/L 101 105 104  CO2 22 - 32 mmol/L 32 28 28  Calcium 8.9 - 10.3 mg/dL 8.1(L) 9.4 9.4  Total Protein 6.5 - 8.1 g/dL 6.9 7.3 6.8  Total Bilirubin 0.3 - 1.2 mg/dL 0.8 1.0 0.4  Alkaline Phos 38 - 126 U/L 84 87 82  AST 15 - 41 U/L _1 ALT 0 - 44 U/L 25 17 38      RADIOGRAPHIC STUDIES: I have personally reviewed the radiological images as listed and agreed with the findings in the report. No results found.   ASSESSMENT & PLAN:  Kayla Mayo is a 63 y.o. female with   1. 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 Last IV iron infusion was in 04/2015. -She is not on iron pills now due to low absorption.  -She is clinically doing well and stable. She did have recent weight loss likely from stress. She is doing better, weight stable. I  encouraged her to watch her weight.  -Labs reviewed, CBC WNL except WBC 3.9, Hg 11.5, retic ct normal, CMP WNL except K 2.9. Iron panel and Vitamin D still pending. May give IV Feraheme if Ferritin <50.  -She will continue Vitamin B12 injections at home, I refilled today.  -continue labs every 6 months, will consider iv iron if ferritin<50 -F/u in 1 year. She will continue to f/u with other physicians in interim and continue age appropriate cancer screenings.     2. Arthritis, DM with neuropathy,depression,insomnia, fatigue -f/u with PCP -Continue medications   3. Hypokalemia and hypocalcemia  -likely related to lasix  -I called in KCL 85mq bid for 7 days then once daily -repeat lab in 2 months -I encourage her to take OTC calcium    Plan  -Labs reviewed, may give IV Feraheme if Ferritin low  -continue B12 injections monthly at home refilled today  -Lab in 6 and 12 months, will consider iv iron if ferritin<50 -I called in KCL for her, repeat lab in 2 month with PCP or [place] -F/u in 12 months   No problem-specific Assessment & Plan notes found for this encounter.   No orders of the defined types were placed in this encounter.  All questions were answered. The patient knows to call the clinic with any problems, questions or concerns. No barriers to learning was detected. I spent 10 minutes counseling the patient face to face. The total time spent in the appointment was 15 minutes and more than 50% was on counseling and review of test results     YTruitt Merle MD 09/26/2018   I, AJoslyn Devon am acting as scribe for YTruitt Merle MD.   I have reviewed the above documentation for accuracy and completeness, and I agree with the above.

## 2018-09-26 ENCOUNTER — Inpatient Hospital Stay: Payer: Medicare HMO

## 2018-09-26 ENCOUNTER — Telehealth: Payer: Self-pay

## 2018-09-26 ENCOUNTER — Encounter: Payer: Self-pay | Admitting: Hematology

## 2018-09-26 ENCOUNTER — Other Ambulatory Visit: Payer: Self-pay

## 2018-09-26 ENCOUNTER — Inpatient Hospital Stay: Payer: Medicare HMO | Attending: Hematology | Admitting: Hematology

## 2018-09-26 VITALS — BP 128/86 | HR 76 | Temp 98.2°F | Resp 18 | Ht 66.0 in | Wt 192.7 lb

## 2018-09-26 DIAGNOSIS — D508 Other iron deficiency anemias: Secondary | ICD-10-CM | POA: Diagnosis not present

## 2018-09-26 DIAGNOSIS — G4700 Insomnia, unspecified: Secondary | ICD-10-CM | POA: Diagnosis not present

## 2018-09-26 DIAGNOSIS — D518 Other vitamin B12 deficiency anemias: Secondary | ICD-10-CM | POA: Diagnosis not present

## 2018-09-26 DIAGNOSIS — E1140 Type 2 diabetes mellitus with diabetic neuropathy, unspecified: Secondary | ICD-10-CM | POA: Diagnosis not present

## 2018-09-26 DIAGNOSIS — M1990 Unspecified osteoarthritis, unspecified site: Secondary | ICD-10-CM | POA: Insufficient documentation

## 2018-09-26 DIAGNOSIS — Z79899 Other long term (current) drug therapy: Secondary | ICD-10-CM | POA: Diagnosis not present

## 2018-09-26 DIAGNOSIS — R5383 Other fatigue: Secondary | ICD-10-CM | POA: Diagnosis not present

## 2018-09-26 DIAGNOSIS — E876 Hypokalemia: Secondary | ICD-10-CM | POA: Insufficient documentation

## 2018-09-26 DIAGNOSIS — Z9884 Bariatric surgery status: Secondary | ICD-10-CM | POA: Diagnosis not present

## 2018-09-26 DIAGNOSIS — E559 Vitamin D deficiency, unspecified: Secondary | ICD-10-CM

## 2018-09-26 DIAGNOSIS — D6489 Other specified anemias: Secondary | ICD-10-CM

## 2018-09-26 DIAGNOSIS — D509 Iron deficiency anemia, unspecified: Secondary | ICD-10-CM

## 2018-09-26 LAB — RETICULOCYTES
Immature Retic Fract: 8.4 % (ref 2.3–15.9)
RBC.: 3.82 MIL/uL — ABNORMAL LOW (ref 3.87–5.11)
Retic Count, Absolute: 61.9 10*3/uL (ref 19.0–186.0)
Retic Ct Pct: 1.6 % (ref 0.4–3.1)

## 2018-09-26 LAB — FERRITIN: Ferritin: 75 ng/mL (ref 11–307)

## 2018-09-26 LAB — CBC WITH DIFFERENTIAL/PLATELET
Abs Immature Granulocytes: 0 10*3/uL (ref 0.00–0.07)
Basophils Absolute: 0 10*3/uL (ref 0.0–0.1)
Basophils Relative: 1 %
Eosinophils Absolute: 0.1 10*3/uL (ref 0.0–0.5)
Eosinophils Relative: 3 %
HCT: 36.5 % (ref 36.0–46.0)
Hemoglobin: 11.5 g/dL — ABNORMAL LOW (ref 12.0–15.0)
Immature Granulocytes: 0 %
Lymphocytes Relative: 37 %
Lymphs Abs: 1.5 10*3/uL (ref 0.7–4.0)
MCH: 30.1 pg (ref 26.0–34.0)
MCHC: 31.5 g/dL (ref 30.0–36.0)
MCV: 95.5 fL (ref 80.0–100.0)
Monocytes Absolute: 0.3 10*3/uL (ref 0.1–1.0)
Monocytes Relative: 7 %
Neutro Abs: 2 10*3/uL (ref 1.7–7.7)
Neutrophils Relative %: 52 %
Platelets: 240 10*3/uL (ref 150–400)
RBC: 3.82 MIL/uL — ABNORMAL LOW (ref 3.87–5.11)
RDW: 13.5 % (ref 11.5–15.5)
WBC: 3.9 10*3/uL — ABNORMAL LOW (ref 4.0–10.5)
nRBC: 0 % (ref 0.0–0.2)

## 2018-09-26 LAB — CMP (CANCER CENTER ONLY)
ALT: 25 U/L (ref 0–44)
AST: 31 U/L (ref 15–41)
Albumin: 3.8 g/dL (ref 3.5–5.0)
Alkaline Phosphatase: 84 U/L (ref 38–126)
Anion gap: 9 (ref 5–15)
BUN: 9 mg/dL (ref 8–23)
CO2: 32 mmol/L (ref 22–32)
Calcium: 8.1 mg/dL — ABNORMAL LOW (ref 8.9–10.3)
Chloride: 101 mmol/L (ref 98–111)
Creatinine: 0.85 mg/dL (ref 0.44–1.00)
GFR, Est AFR Am: >60 mL/min (ref >60)
GFR, Estimated: >60 mL/min (ref >60)
Glucose, Bld: 134 mg/dL — ABNORMAL HIGH (ref 70–99)
Potassium: 2.9 mmol/L — CL (ref 3.5–5.1)
Sodium: 142 mmol/L (ref 135–145)
Total Bilirubin: 0.8 mg/dL (ref 0.3–1.2)
Total Protein: 6.9 g/dL (ref 6.5–8.1)

## 2018-09-26 LAB — IRON AND TIBC
Iron: 85 ug/dL (ref 41–142)
Saturation Ratios: 29 % (ref 21–57)
TIBC: 294 ug/dL (ref 236–444)
UIBC: 209 ug/dL (ref 120–384)

## 2018-09-26 MED ORDER — POTASSIUM CHLORIDE CRYS ER 20 MEQ PO TBCR: 20.0000 meq | EXTENDED_RELEASE_TABLET | Freq: Two times a day (BID) | ORAL | 1 refills | Status: DC

## 2018-09-26 MED ORDER — SYRINGE 2-3 ML 3 ML MISC: 25.0000 | 0 refills | Status: DC

## 2018-09-26 MED ORDER — CYANOCOBALAMIN 1000 MCG/ML IJ KIT: 1000.0000 ug | PACK | INTRAMUSCULAR | 5 refills | Status: DC

## 2018-09-26 NOTE — Telephone Encounter (Signed)
Spoke with patient regarding lab results, per Dr. Burr Medico potassium is low, instructed to hold Lasix for one week, take Potassium 20 meq tablets one twice daily for one week, then take on daily thereafter.  We will need to recheck lab results in 2 months, can be done here or PCP, patient prefers to come to [facility] for lab, scheduling message has been sent. She verbalized an understanding on how to take the Potassium and f/u.

## 2018-09-27 ENCOUNTER — Telehealth: Payer: Self-pay | Admitting: Hematology

## 2018-09-27 ENCOUNTER — Telehealth: Payer: Self-pay

## 2018-09-27 LAB — VITAMIN D 25 HYDROXY (VIT D DEFICIENCY, FRACTURES): Vit D, 25-Hydroxy: 39 ng/mL (ref 30.0–100.0)

## 2018-09-27 NOTE — Telephone Encounter (Signed)
-----   Message from Truitt Merle, MD sent at 09/27/2018  8:06 AM EDT ----- Please let pt know her lab results, iron level WNL but lower than a year ago. Given her mild anemia, I will set up one dose iv iron next week, VitD level normal, make sure she knows her K is low and I have called in KCL for her for 2 months, thanks   Truitt Merle  09/27/2018

## 2018-09-27 NOTE — Telephone Encounter (Signed)
Spoke with patient regarding lab results, per Dr. Burr Medico notified her iron level WNL but lower than one year ago, Dr. Burr Medico is recommending one dose iv iron next week. She is in agreement.  Notified her Vitamin D level is normal, she is aware of her low potassium as I discussed this with her yesterday.   Scheduling message was sent.

## 2018-09-27 NOTE — Telephone Encounter (Signed)
Scheduled appt per 5/27 los.  A calendar will be mailed out.

## 2018-09-28 ENCOUNTER — Telehealth: Payer: Self-pay | Admitting: Hematology

## 2018-09-28 NOTE — Telephone Encounter (Signed)
Added lab for July. Confirmed with patient. Other appointments remain the same.

## 2018-10-05 ENCOUNTER — Inpatient Hospital Stay: Payer: Medicare HMO | Attending: Hematology

## 2018-10-05 ENCOUNTER — Other Ambulatory Visit: Payer: Self-pay

## 2018-10-05 ENCOUNTER — Other Ambulatory Visit: Payer: Self-pay | Admitting: Hematology

## 2018-10-05 VITALS — BP 124/84 | HR 66 | Temp 98.6°F | Resp 18

## 2018-10-05 DIAGNOSIS — D519 Vitamin B12 deficiency anemia, unspecified: Secondary | ICD-10-CM

## 2018-10-05 DIAGNOSIS — D508 Other iron deficiency anemias: Secondary | ICD-10-CM | POA: Insufficient documentation

## 2018-10-05 DIAGNOSIS — D518 Other vitamin B12 deficiency anemias: Secondary | ICD-10-CM

## 2018-10-05 MED ORDER — SODIUM CHLORIDE 0.9 % IV SOLN
510.0000 mg | Freq: Once | INTRAVENOUS | Status: AC
  Administered 2018-10-05: 510 mg via INTRAVENOUS
  Filled 2018-10-05: qty 17

## 2018-10-05 MED ORDER — "SYRINGE/NEEDLE (DISP) 23G X 1"" 3 ML MISC": 1.0000 mL | 1 refills | Status: DC

## 2018-10-05 MED ORDER — SODIUM CHLORIDE 0.9 % IV SOLN
Freq: Once | INTRAVENOUS | Status: AC
  Administered 2018-10-05: 09:00:00 via INTRAVENOUS
  Filled 2018-10-05: qty 250

## 2018-10-05 NOTE — Patient Instructions (Signed)

## 2018-11-23 ENCOUNTER — Inpatient Hospital Stay: Payer: Medicare HMO | Attending: Hematology

## 2018-11-23 ENCOUNTER — Other Ambulatory Visit: Payer: Self-pay

## 2018-11-23 DIAGNOSIS — D508 Other iron deficiency anemias: Secondary | ICD-10-CM | POA: Diagnosis not present

## 2018-11-23 DIAGNOSIS — D6489 Other specified anemias: Secondary | ICD-10-CM

## 2018-11-23 LAB — CBC WITH DIFFERENTIAL/PLATELET
Abs Immature Granulocytes: 0.01 10*3/uL (ref 0.00–0.07)
Basophils Absolute: 0 10*3/uL (ref 0.0–0.1)
Basophils Relative: 1 %
Eosinophils Absolute: 0.1 10*3/uL (ref 0.0–0.5)
Eosinophils Relative: 2 %
HCT: 37.4 % (ref 36.0–46.0)
Hemoglobin: 11.9 g/dL — ABNORMAL LOW (ref 12.0–15.0)
Immature Granulocytes: 0 %
Lymphocytes Relative: 36 %
Lymphs Abs: 1.2 10*3/uL (ref 0.7–4.0)
MCH: 30.1 pg (ref 26.0–34.0)
MCHC: 31.8 g/dL (ref 30.0–36.0)
MCV: 94.7 fL (ref 80.0–100.0)
Monocytes Absolute: 0.2 10*3/uL (ref 0.1–1.0)
Monocytes Relative: 6 %
Neutro Abs: 1.9 10*3/uL (ref 1.7–7.7)
Neutrophils Relative %: 55 %
Platelets: 195 10*3/uL (ref 150–400)
RBC: 3.95 MIL/uL (ref 3.87–5.11)
RDW: 13.2 % (ref 11.5–15.5)
WBC: 3.5 10*3/uL — ABNORMAL LOW (ref 4.0–10.5)
nRBC: 0 % (ref 0.0–0.2)

## 2018-11-23 LAB — COMPREHENSIVE METABOLIC PANEL
ALT: 19 U/L (ref 0–44)
AST: 21 U/L (ref 15–41)
Albumin: 3.7 g/dL (ref 3.5–5.0)
Alkaline Phosphatase: 90 U/L (ref 38–126)
Anion gap: 8 (ref 5–15)
BUN: 7 mg/dL — ABNORMAL LOW (ref 8–23)
CO2: 27 mmol/L (ref 22–32)
Calcium: 9.2 mg/dL (ref 8.9–10.3)
Chloride: 109 mmol/L (ref 98–111)
Creatinine, Ser: 0.85 mg/dL (ref 0.44–1.00)
GFR calc Af Amer: >60 mL/min (ref >60)
GFR calc non Af Amer: >60 mL/min (ref >60)
Glucose, Bld: 117 mg/dL — ABNORMAL HIGH (ref 70–99)
Potassium: 4.2 mmol/L (ref 3.5–5.1)
Sodium: 144 mmol/L (ref 135–145)
Total Bilirubin: 0.5 mg/dL (ref 0.3–1.2)
Total Protein: 6.7 g/dL (ref 6.5–8.1)

## 2018-11-23 LAB — IRON AND TIBC
Iron: 95 ug/dL (ref 41–142)
Saturation Ratios: 37 % (ref 21–57)
TIBC: 259 ug/dL (ref 236–444)
UIBC: 163 ug/dL (ref 120–384)

## 2018-11-23 LAB — FERRITIN: Ferritin: 163 ng/mL (ref 11–307)

## 2018-11-23 NOTE — Progress Notes (Signed)
Called in 25G x 1" [employer] syringe per patient's request #12 to [place] on file.

## 2018-11-26 ENCOUNTER — Encounter: Payer: Self-pay | Admitting: Hematology

## 2018-11-29 ENCOUNTER — Telehealth: Payer: Self-pay

## 2018-11-29 NOTE — Telephone Encounter (Signed)
Left voice message for [facility] calling in 1 cc [employer] syringes (25g x 1" needle) for B12 injection administration, dispense #12 or minimum amount to this patient.  Requested they call back if they have any questions.

## 2019-01-29 ENCOUNTER — Encounter: Payer: Self-pay | Admitting: Gynecology

## 2019-02-11 ENCOUNTER — Other Ambulatory Visit: Payer: Self-pay | Admitting: Orthopedic Surgery

## 2019-02-13 ENCOUNTER — Telehealth: Payer: Self-pay

## 2019-02-13 NOTE — Telephone Encounter (Signed)
Faxed preoperative risk assessment to [place] to fax [phone], sent to HIM to scan.

## 2019-02-26 NOTE — Patient Instructions (Addendum)
DUE TO COVID-19 ONLY ONE VISITOR IS ALLOWED TO COME WITH YOU AND STAY IN THE WAITING ROOM ONLY DURING PRE OP AND PROCEDURE DAY OF SURGERY. THE 1 VISITOR MAY VISIT WITH YOU AFTER SURGERY IN YOUR PRIVATE ROOM DURING VISITING HOURS ONLY!  YOU NEED TO HAVE A COVID 19 TEST ON_Thursday 10/29/2020__ @_10 :35 am______, THIS TEST MUST BE DONE BEFORE SURGERY, COME  [place] , [id number].  ([place]) ONCE YOUR COVID TEST IS COMPLETED, PLEASE BEGIN THE QUARANTINE INSTRUCTIONS AS OUTLINED IN YOUR HANDOUT.                [place]   Your procedure is scheduled on: Monday 03/04/2019   Report to [facility] Main  Entrance    Report to admitting at 9:45am     Call this number if you have problems the morning of surgery [phone]    Remember: Do not eat food after Midnight.   BRUSH YOUR TEETH MORNING OF SURGERY AND RINSE YOUR MOUTH OUT, NO CHEWING GUM CANDY OR MINTS.  NO SOLID FOOD AFTER MIDNIGHT THE NIGHT PRIOR TO SURGERY. NOTHING BY MOUTH EXCEPT CLEAR LIQUIDS UNTIL 9:15am .   PLEASE FINISH Gatorade (G2) DRINK PER SURGEON ORDER  WHICH NEEDS TO BE COMPLETED AT 9:15am .    CLEAR LIQUID DIET   Foods Allowed                                                                     Foods Excluded  Coffee and tea, regular and decaf                             liquids that you cannot  Plain Jell-O any favor except red or purple             see through such as: Fruit ices (not with fruit pulp)                                     milk, soups, orange juice  Iced Popsicles                                    All solid food Carbonated beverages, regular and diet                                    Cranberry, grape and apple juices Sports drinks like Gatorade Lightly seasoned clear broth or consume(fat free) Sugar, honey syrup  Sample Menu Breakfast                                Lunch                                     Supper Cranberry juice  Beef broth                            Chicken broth Jell-O                                     Grape juice                           Apple juice Coffee or tea                        Jell-O                                      Popsicle                                                Coffee or tea                        Coffee or tea  _____________________________________________________________________      Take these medicines the morning of surgery with A SIP OF WATER: Gabapentin (Neurontin), Meclizine (Antivert),Flonase if needed,  Omeprazole (Prilosec)  How to Manage Your Diabetes  Before and After Surgery  Why is it important to control my blood sugar before and after surgery? . Improving blood sugar levels before and after surgery helps healing and can limit problems. . A way of improving blood sugar control is eating a healthy diet by: o  Eating less sugar and carbohydrates o  Increasing activity/exercise o  Talking with your doctor about reaching your blood sugar goals . High blood sugars (greater than 180 mg/dL) can raise your risk of infections and slow your recovery, so you will need to focus on controlling your diabetes during the weeks before surgery. . Make sure that the doctor who takes care of your diabetes knows about your planned surgery including the date and location.  How do I manage my blood sugar before surgery? . Check your blood sugar at least 4 times a day, starting 2 days before surgery, to make sure that the level is not too high or low. o Check your blood sugar the morning of your surgery when you wake up and every 2 hours until you get to the Short Stay unit. . If your blood sugar is less than 70 mg/dL, you will need to treat for low blood sugar: o Do not take insulin. o Treat a low blood sugar (less than 70 mg/dL) with  cup of clear juice (cranberry or apple), 4 glucose tablets, OR glucose gel. o Recheck blood sugar in 15 minutes after treatment (to  make sure it is greater than 70 mg/dL). If your blood sugar is not greater than 70 mg/dL on recheck, call [phone] for further instructions. . Report your blood sugar to the short stay nurse when you get to Short Stay.  . If you are admitted to the hospital after surgery: o Your blood sugar will be checked by the staff and you will probably be given insulin after surgery (instead of oral diabetes medicines) to  make sure you have good blood sugar levels. o The goal for blood sugar control after surgery is 80-180 mg/dL.   WHAT DO I DO ABOUT MY DIABETES MEDICATION?  DO NOT TAKE ANY DIABETIC MEDICATIONS DAY OF YOUR SURGERY (Including injectables)                               You may not have any metal on your body including hair pins and              piercings  Do not wear jewelry, make-up, lotions, powders or perfumes, deodorant             Do not wear nail polish on your fingernails.  Do not shave  48 hours prior to surgery.               Do not bring valuables to the hospital. [place].  Contacts, dentures or bridgework may not be worn into surgery.  Leave suitcase in the car. After surgery it may be brought to your room.             Please read over the following fact sheets you were given: _____________________________________________________________________             [facility] - Preparing for Surgery Before surgery, you can play an important role.  Because skin is not sterile, your skin needs to be as free of germs as possible.  You can reduce the number of germs on your skin by washing with CHG (chlorahexidine gluconate) soap before surgery.  CHG is an antiseptic cleaner which kills germs and bonds with the skin to continue killing germs even after washing. Please DO NOT use if you have an allergy to CHG or antibacterial soaps.  If your skin becomes reddened/irritated stop using the CHG and inform your nurse when you arrive at  Short Stay. Do not shave (including legs and underarms) for at least 48 hours prior to the first CHG shower.  You may shave your face/neck. Please follow these instructions carefully:  1.  Shower with CHG Soap the night before surgery and the  morning of Surgery.  2.  If you choose to wash your hair, wash your hair first as usual with your  normal  shampoo.  3.  After you shampoo, rinse your hair and body thoroughly to remove the  shampoo.                            4.  Use CHG as you would any other liquid soap.  You can apply chg directly  to the skin and wash                       Gently with a scrungie or clean washcloth.  5.  Apply the CHG Soap to your body ONLY FROM THE NECK DOWN.   Do not use on face/ open                           Wound or open sores. Avoid contact with eyes, ears mouth and genitals (private parts).  Wash face,  Genitals (private parts) with your normal soap.             6.  Wash thoroughly, paying special attention to the area where your surgery  will be performed.  7.  Thoroughly rinse your body with warm water from the neck down.  8.  DO NOT shower/wash with your normal soap after using and rinsing off  the CHG Soap.                9.  Pat yourself dry with a clean towel.            10.  Wear clean pajamas.            11.  Place clean sheets on your bed the night of your first shower and do not  sleep with pets. Day of Surgery : Do not apply any lotions/deodorants the morning of surgery.  Please wear clean clothes to the hospital/surgery center.  FAILURE TO FOLLOW THESE INSTRUCTIONS MAY RESULT IN THE CANCELLATION OF YOUR SURGERY PATIENT SIGNATURE_________________________________  NURSE SIGNATURE__________________________________  ________________________________________________________________________   Adam Phenix  An incentive spirometer is a tool that can help keep your lungs clear and active. This tool measures how well you  are filling your lungs with each breath. Taking long deep breaths may help reverse or decrease the chance of developing breathing (pulmonary) problems (especially infection) following:  A long period of time when you are unable to move or be active. BEFORE THE PROCEDURE   If the spirometer includes an indicator to show your best effort, your nurse or respiratory therapist will set it to a desired goal.  If possible, sit up straight or lean slightly forward. Try not to slouch.  Hold the incentive spirometer in an upright position. INSTRUCTIONS FOR USE  1. Sit on the edge of your bed if possible, or sit up as far as you can in bed or on a chair. 2. Hold the incentive spirometer in an upright position. 3. Breathe out normally. 4. Place the mouthpiece in your mouth and seal your lips tightly around it. 5. Breathe in slowly and as deeply as possible, raising the piston or the ball toward the top of the column. 6. Hold your breath for 3-5 seconds or for as long as possible. Allow the piston or ball to fall to the bottom of the column. 7. Remove the mouthpiece from your mouth and breathe out normally. 8. Rest for a few seconds and repeat Steps 1 through 7 at least 10 times every 1-2 hours when you are awake. Take your time and take a few normal breaths between deep breaths. 9. The spirometer may include an indicator to show your best effort. Use the indicator as a goal to work toward during each repetition. 10. After each set of 10 deep breaths, practice coughing to be sure your lungs are clear. If you have an incision (the cut made at the time of surgery), support your incision when coughing by placing a pillow or rolled up towels firmly against it. Once you are able to get out of bed, walk around indoors and cough well. You may stop using the incentive spirometer when instructed by your caregiver.  RISKS AND COMPLICATIONS  Take your time so you do not get dizzy or light-headed.  If you are in  pain, you may need to take or ask for pain medication before doing incentive spirometry. It is harder to take a deep breath if you are having  pain. AFTER USE  Rest and breathe slowly and easily.  It can be helpful to keep track of a log of your progress. Your caregiver can provide you with a simple table to help with this. If you are using the spirometer at home, follow these instructions: [place] IF:   You are having difficultly using the spirometer.  You have trouble using the spirometer as often as instructed.  Your pain medication is not giving enough relief while using the spirometer.  You develop fever of 100.5 F (38.1 C) or higher. SEEK IMMEDIATE MEDICAL CARE IF:   You cough up bloody sputum that had not been present before.  You develop fever of 102 F (38.9 C) or greater.  You develop worsening pain at or near the incision site. MAKE SURE YOU:   Understand these instructions.  Will watch your condition.  Will get help right away if you are not doing well or get worse. Document Released: 08/29/2006 Document Revised: 07/11/2011 Document Reviewed: 10/30/2006 Exit[employer] Patient Information 2014 [employer], [place].   ________________________________________________________________________  WHAT IS A BLOOD TRANSFUSION? Blood Transfusion Information  A transfusion is the replacement of blood or some of its parts. Blood is made up of multiple cells which provide different functions.  Red blood cells carry oxygen and are used for blood loss replacement.  White blood cells fight against infection.  Platelets control bleeding.  Plasma helps clot blood.  Other blood products are available for specialized needs, such as hemophilia or other clotting disorders. BEFORE THE TRANSFUSION  Who gives blood for transfusions?   Healthy volunteers who are fully evaluated to make sure their blood is safe. This is blood bank blood. Transfusion therapy is the safest it has  ever been in the practice of medicine. Before blood is taken from a donor, a complete history is taken to make sure that person has no history of diseases nor engages in risky social behavior (examples are intravenous drug use or sexual activity with multiple partners). The donor's travel history is screened to minimize risk of transmitting infections, such as malaria. The donated blood is tested for signs of infectious diseases, such as HIV and hepatitis. The blood is then tested to be sure it is compatible with you in order to minimize the chance of a transfusion reaction. If you or a relative donates blood, this is often done in anticipation of surgery and is not appropriate for emergency situations. It takes many days to process the donated blood. RISKS AND COMPLICATIONS Although transfusion therapy is very safe and saves many lives, the main dangers of transfusion include:   Getting an infectious disease.  Developing a transfusion reaction. This is an allergic reaction to something in the blood you were given. Every precaution is taken to prevent this. The decision to have a blood transfusion has been considered carefully by your caregiver before blood is given. Blood is not given unless the benefits outweigh the risks. AFTER THE TRANSFUSION  Right after receiving a blood transfusion, you will usually feel much better and more energetic. This is especially true if your red blood cells have gotten low (anemic). The transfusion raises the level of the red blood cells which carry oxygen, and this usually causes an energy increase.  The nurse administering the transfusion will monitor you carefully for complications. HOME CARE INSTRUCTIONS  No special instructions are needed after a transfusion. You may find your energy is better. Speak with your caregiver about any limitations on activity for underlying diseases  you may have. SEEK MEDICAL CARE IF:   Your condition is not improving after your  transfusion.  You develop redness or irritation at the intravenous (IV) site. SEEK IMMEDIATE MEDICAL CARE IF:  Any of the following symptoms occur over the next 12 hours:  Shaking chills.  You have a temperature by mouth above 102 F (38.9 C), not controlled by medicine.  Chest, back, or muscle pain.  People around you feel you are not acting correctly or are confused.  Shortness of breath or difficulty breathing.  Dizziness and fainting.  You get a rash or develop hives.  You have a decrease in urine output.  Your urine turns a dark color or changes to pink, red, or brown. Any of the following symptoms occur over the next 10 days:  You have a temperature by mouth above 102 F (38.9 C), not controlled by medicine.  Shortness of breath.  Weakness after normal activity.  The white part of the eye turns yellow (jaundice).  You have a decrease in the amount of urine or are urinating less often.  Your urine turns a dark color or changes to pink, red, or brown. Document Released: 04/15/2000 Document Revised: 07/11/2011 Document Reviewed: 12/03/2007 [facility] Patient Information 2014 [place], [place].  _______________________________________________________________________

## 2019-02-26 NOTE — Progress Notes (Signed)
PCP - [name] MD Cardiologist -   Chest x-ray - 02/28/2019 EKG - 02/28/2019 Stress Test -  ECHO -  Cardiac Cath -   Sleep Study -  CPAP -   Fasting Blood Sugar - 120 Checks Blood Sugar _2____ times a day  Blood Thinner Instructions: Aspirin Instructions: Last Dose:  Anesthesia review:   Patient denies shortness of breath, fever, cough and chest pain at PAT appointment   Patient verbalized understanding of instructions that were given to them at the PAT appointment. Patient was also instructed that they will need to review over the PAT instructions again at home before surgery.

## 2019-02-28 ENCOUNTER — Other Ambulatory Visit: Payer: Self-pay

## 2019-02-28 ENCOUNTER — Encounter (HOSPITAL_COMMUNITY)
Admission: RE | Admit: 2019-02-28 | Discharge: 2019-02-28 | Disposition: A | Discharge status: Home / Self Care | Payer: Medicare HMO | Source: Ambulatory Visit | Attending: Orthopedic Surgery | Admitting: Orthopedic Surgery

## 2019-02-28 ENCOUNTER — Inpatient Hospital Stay (HOSPITAL_COMMUNITY): Admission: RE | Admit: 2019-02-28 | Payer: Medicare HMO | Source: Ambulatory Visit

## 2019-02-28 ENCOUNTER — Ambulatory Visit (HOSPITAL_COMMUNITY)
Admission: RE | Admit: 2019-02-28 | Discharge: 2019-02-28 | Disposition: A | Discharge status: Home / Self Care | Payer: Medicare HMO | Source: Ambulatory Visit | Attending: Orthopedic Surgery | Admitting: Orthopedic Surgery

## 2019-02-28 ENCOUNTER — Encounter (HOSPITAL_COMMUNITY): Payer: Self-pay

## 2019-02-28 DIAGNOSIS — R9431 Abnormal electrocardiogram [ECG] [EKG]: Secondary | ICD-10-CM | POA: Diagnosis not present

## 2019-02-28 DIAGNOSIS — Z01818 Encounter for other preprocedural examination: Secondary | ICD-10-CM

## 2019-02-28 DIAGNOSIS — M1711 Unilateral primary osteoarthritis, right knee: Secondary | ICD-10-CM | POA: Insufficient documentation

## 2019-02-28 HISTORY — DX: Hypothyroidism, unspecified: E03.9

## 2019-02-28 HISTORY — DX: Fibromyalgia: M79.7

## 2019-02-28 LAB — URINALYSIS, ROUTINE W REFLEX MICROSCOPIC
Bilirubin Urine: NEGATIVE
Glucose, UA: NEGATIVE mg/dL
Hgb urine dipstick: NEGATIVE
Ketones, ur: NEGATIVE mg/dL
Leukocytes,Ua: NEGATIVE
Nitrite: NEGATIVE
Protein, ur: NEGATIVE mg/dL
Specific Gravity, Urine: 1.021 (ref 1.005–1.030)
pH: 5 (ref 5.0–8.0)

## 2019-02-28 LAB — GLUCOSE, CAPILLARY: Glucose-Capillary: 167 mg/dL — ABNORMAL HIGH (ref 70–99)

## 2019-02-28 LAB — CBC WITH DIFFERENTIAL/PLATELET
Abs Immature Granulocytes: 0.01 10*3/uL (ref 0.00–0.07)
Basophils Absolute: 0 10*3/uL (ref 0.0–0.1)
Basophils Relative: 1 %
Eosinophils Absolute: 0.1 10*3/uL (ref 0.0–0.5)
Eosinophils Relative: 1 %
HCT: 40.5 % (ref 36.0–46.0)
Hemoglobin: 12.7 g/dL (ref 12.0–15.0)
Immature Granulocytes: 0 %
Lymphocytes Relative: 28 %
Lymphs Abs: 1.3 10*3/uL (ref 0.7–4.0)
MCH: 30.5 pg (ref 26.0–34.0)
MCHC: 31.4 g/dL (ref 30.0–36.0)
MCV: 97.1 fL (ref 80.0–100.0)
Monocytes Absolute: 0.3 10*3/uL (ref 0.1–1.0)
Monocytes Relative: 6 %
Neutro Abs: 2.9 10*3/uL (ref 1.7–7.7)
Neutrophils Relative %: 64 %
Platelets: 216 10*3/uL (ref 150–400)
RBC: 4.17 MIL/uL (ref 3.87–5.11)
RDW: 12.9 % (ref 11.5–15.5)
WBC: 4.6 10*3/uL (ref 4.0–10.5)
nRBC: 0 % (ref 0.0–0.2)

## 2019-02-28 LAB — BASIC METABOLIC PANEL
Anion gap: 8 (ref 5–15)
BUN: 10 mg/dL (ref 8–23)
CO2: 30 mmol/L (ref 22–32)
Calcium: 9.4 mg/dL (ref 8.9–10.3)
Chloride: 104 mmol/L (ref 98–111)
Creatinine, Ser: 0.94 mg/dL (ref 0.44–1.00)
GFR calc Af Amer: >60 mL/min (ref >60)
GFR calc non Af Amer: >60 mL/min (ref >60)
Glucose, Bld: 147 mg/dL — ABNORMAL HIGH (ref 70–99)
Potassium: 3.5 mmol/L (ref 3.5–5.1)
Sodium: 142 mmol/L (ref 135–145)

## 2019-02-28 LAB — PROTIME-INR
INR: 0.9 (ref 0.8–1.2)
Prothrombin Time: 12.5 seconds (ref 11.4–15.2)

## 2019-02-28 LAB — SURGICAL PCR SCREEN
MRSA, PCR: NEGATIVE
Staphylococcus aureus: NEGATIVE

## 2019-02-28 LAB — APTT: aPTT: 27 seconds (ref 24–36)

## 2019-02-28 NOTE — Progress Notes (Signed)
Refusal of all Blood and or All Blood Products faxed to [facility] and Dr. Frederik Pear. Successful transmission Notice on chart.

## 2019-03-01 ENCOUNTER — Other Ambulatory Visit (HOSPITAL_COMMUNITY)
Admission: RE | Admit: 2019-03-01 | Discharge: 2019-03-01 | Disposition: A | Discharge status: Home / Self Care | Payer: Medicare HMO | Source: Ambulatory Visit | Attending: Orthopedic Surgery | Admitting: Orthopedic Surgery

## 2019-03-01 DIAGNOSIS — M1711 Unilateral primary osteoarthritis, right knee: Secondary | ICD-10-CM | POA: Diagnosis present

## 2019-03-01 DIAGNOSIS — Z20828 Contact with and (suspected) exposure to other viral communicable diseases: Secondary | ICD-10-CM | POA: Insufficient documentation

## 2019-03-01 DIAGNOSIS — Z01812 Encounter for preprocedural laboratory examination: Secondary | ICD-10-CM | POA: Insufficient documentation

## 2019-03-01 NOTE — H&P (Signed)
TOTAL KNEE ADMISSION H&P  Patient is being admitted for right total knee arthroplasty.  Subjective:  Chief Complaint:right knee pain.  HPI: Kayla Mayo, 63 y.o. female, has a history of pain and functional disability in the right knee due to arthritis and has failed non-surgical conservative treatments for greater than 12 weeks to includeNSAID's and/or analgesics, corticosteriod injections, use of assistive devices, weight reduction as appropriate and activity modification.  Onset of symptoms was gradual, starting 5 years ago with gradually worsening course since that time. The patient noted prior procedures on the knee to include  arthroscopy on the right knee(s).  Patient currently rates pain in the right knee(s) at 10 out of 10 with activity. Patient has night pain, worsening of pain with activity and weight bearing, pain that interferes with activities of daily living, pain with passive range of motion, crepitus and joint swelling.  Patient has evidence of joint space narrowing by imaging studies.   There is no active infection.  Patient Active Problem List   Diagnosis Date Noted  . Chronic pain syndrome 12/30/2015  . Relationship problem between partners 12/30/2015  . Anemia of decreased vitamin B12 absorption 10/12/2015  . Iron deficiency anemia 11/28/2013  . Major depressive disorder, recurrent ([place]) 10/06/2012  . GAD (generalized anxiety disorder) 08/22/2012  . Osteopenia   . Vitamin D deficiency 03/23/2011  . Insomnia 03/23/2011  . Anemia 03/17/2011  . Proteinuria 10/19/2010  . Back pain 10/19/2010  . Diabetes mellitus with peripheral autonomic neuropathy ([place]) 07/27/2010  . Weight gain 07/27/2010  . SHOULDER PAIN, RIGHT 05/12/2010  . GASTROENTERITIS WITHOUT DEHYDRATION 09/17/2009  . DRY SKIN 06/03/2009  . HYPERLIPIDEMIA 05/07/2009  . Anemia due to other cause 05/07/2009  . Major depressive disorder, recurrent episode, moderate ([place]) 05/07/2009  . ESSENTIAL HYPERTENSION,  BENIGN 05/07/2009  . ARTHRITIS, KNEES, BILATERAL 05/07/2009  . DM 01/21/2009  . DYSPEPSIA&OTHER [facility] DISORDERS FUNCTION STOMACH 01/21/2009  . CONSTIPATION 01/21/2009  . DYSPHAGIA UNSPECIFIED 01/21/2009   Past Medical History:  Diagnosis Date  . Anal fissure   . Anemia 03/17/2011  . Anxiety   . Arthritis   . Breast cancer ([place])   . Bursitis of hip   . Chronic headaches   . Colon polyps   . Depression   . Diabetes mellitus   . Esophageal spasm   . Fibromyalgia   . Gallstones   . GERD (gastroesophageal reflux disease)   . Hyperlipidemia   . Hypertension   . Hypothyroidism   . IBS (irritable bowel syndrome)   . Neuropathy   . Obesity   . Osteopenia 07/2011   t score -1.5 FRAX 2.1%/0%  . Status post cardiac catheterization   . Vertigo    Occurs 6 times a month    Past Surgical History:  Procedure Laterality Date  . APPENDECTOMY    . bilareral knee scopes    . CARPAL TUNNEL RELEASE    . CHOLECYSTECTOMY    . GASTRIC BYPASS    . left shouler surgery    . lower back surgery    . TUBAL LIGATION      No current facility-administered medications for this encounter.    Current Outpatient Medications  Medication Sig Dispense Refill Last Dose  . Cholecalciferol (VITAMIN D3) 125 MCG (5000 UT) CAPS Take 5,000 Units by mouth daily.     . Cyanocobalamin 1000 MCG/ML KIT Inject 1,000 mcg as directed every 30 (thirty) days. 1 kit 5   . cyclobenzaprine (FLEXERIL) 10 MG tablet Take 10 mg  by mouth 3 (three) times daily as needed for muscle spasms.      . diclofenac sodium (VOLTAREN) 1 % GEL Apply 1 application topically 2 (two) times daily.      . fluticasone (FLONASE) 50 MCG/ACT nasal spray Place 2 sprays into both nostrils 2 (two) times daily.     . furosemide (LASIX) 20 MG tablet Take 1 tablet (20 mg total) by mouth 2 (two) times daily. 180 tablet 3   . gabapentin (NEURONTIN) 600 MG tablet Take 600 mg by mouth 5 (five) times daily.      Marland Kitchen HYDROcodone-acetaminophen (NORCO) 10-325 MG  tablet Take 1 tablet by mouth every 6 (six) hours.   0   . hydrocortisone (PROCTOSOL HC) 2.5 % rectal cream Place 1 application rectally as needed. 30 g 0   . hydrOXYzine (ATARAX/VISTARIL) 25 MG tablet Take 50 mg by mouth at bedtime.     Marland Kitchen levothyroxine (SYNTHROID, LEVOTHROID) 25 MCG tablet Take 25 mcg by mouth at bedtime.      . Linaclotide (LINZESS) 290 MCG CAPS capsule Take 290 mcg by mouth daily.      . meclizine (ANTIVERT) 25 MG tablet Take 25 mg by mouth 3 (three) times daily as needed for dizziness.      . naproxen (NAPROSYN) 500 MG tablet Take 1 tablet (500 mg total) by mouth 2 (two) times daily with a meal. (Patient taking differently: Take 500 mg by mouth 2 (two) times daily as needed (PAIN). ) 20 tablet 0   . omeprazole (PRILOSEC) 40 MG capsule Take 40 mg by mouth daily.     . polyethylene glycol powder (GLYCOLAX/MIRALAX) powder Take 17 g by mouth 2 (two) times daily. (Patient taking differently: Take 17 g by mouth 4 (four) times daily. ) 255 g 0   . rosuvastatin (CRESTOR) 10 MG tablet Take 10 mg by mouth at bedtime.      . Semaglutide (OZEMPIC, 0.25 OR 0.5 MG/DOSE, [place]) Inject 0.5 mg into the skin every Friday.     . senna (SENOKOT) 8.6 MG tablet Take 4 tablets by mouth 2 (two) times daily.     Marland Kitchen ACCU-CHEK FASTCLIX LANCETS MISC Use daily as directed for Dx: 250.00     . Syringe, Disposable, (2-3CC SYRINGE) 3 ML MISC 25 Syringes by Does not apply route every 30 (thirty) days. 1 each 0   . SYRINGE-NEEDLE, DISP, 3 ML (LUER LOCK SAFETY SYRINGES) 23G X 1" 3 ML MISC 1 mL by Does not apply route every 30 (thirty) days. For B12 home administration 12 each 1    Facility-Administered Medications Ordered in Other Encounters  Medication Dose Route Frequency Provider Last Rate Last Dose  . cyanocobalamin ((VITAMIN B-12)) injection 1,000 mcg  1,000 mcg Intramuscular Q28 days Marcy Panning, MD       Allergies  Allergen Reactions  . Chicken Allergy     Sinus drainage  . Anesthetics, Amide Nausea  And Vomiting  . Morphine And Related Itching  . Blood-Group Specific Substance     Refuses Blood  . Codeine   . Corn-Containing Products   . Lactose Intolerance ([place])     Upsets stoamch  . Other     Statins  . Peanut-Containing Drug Products   . Sesame Oil     Sinus drainage  . Simvastatin     Muscle pain  . Wheat     Sinus drainage  . Lisinopril Cough    Social History   Tobacco Use  . Smoking status: Never  Smoker  . Smokeless tobacco: Never Used  Substance Use Topics  . Alcohol use: No    Family History  Problem Relation Age of Onset  . Diabetes Mother   . Stroke Mother   . Heart disease Mother   . Hypertension Mother   . Hyperlipidemia Mother   . Anxiety disorder Mother   . Diabetes Brother   . Alcohol abuse Brother   . Drug abuse Brother   . Alcohol abuse Father   . Cancer Father        Stomach  . Diabetes Maternal Aunt   . Diabetes Maternal Uncle   . Alcohol abuse Maternal Uncle   . Diabetes Maternal Grandmother   . Dementia Maternal Grandmother   . Depression Maternal Grandmother   . Alcohol abuse Paternal Uncle   . Cancer Paternal Uncle        Throat  . Alcohol abuse Maternal Grandfather   . Alcohol abuse Paternal Grandfather   . Alcohol abuse Maternal Uncle   . Alcohol abuse Paternal Uncle   . Alcohol abuse Paternal Uncle   . Cancer Paternal Aunt        Leukemia     Review of Systems  Constitutional: Positive for malaise/fatigue.  HENT: Positive for sinus pain.   Eyes: Negative.   Respiratory: Negative.   Cardiovascular: Negative.   Gastrointestinal: Positive for constipation.  Musculoskeletal: Positive for joint pain and myalgias.  Skin: Negative.   Neurological: Negative.   Endo/Heme/Allergies: Negative.   Psychiatric/Behavioral: The patient is nervous/anxious.     Objective:  Physical Exam  Constitutional: She is oriented to person, place, and time. She appears well-developed and well-nourished.  HENT:  Head: Normocephalic and  atraumatic.  Eyes: Pupils are equal, round, and reactive to light.  Neck: Normal range of motion. Neck supple.  Cardiovascular: Intact distal pulses.  Respiratory: Effort normal.  Musculoskeletal:        General: Tenderness present.     Comments: Right greater than left knee valgus deformities of 8 tender along the lateral joint line of the right knee crepitus as you take her through range of motion trace effusion collateral ligaments are stable but valgus stress exacerbates her pain.  Range of motion 0/130 bilaterally with pain on extremes.  Skin is intact neurovascular intact distally.  Neurological: She is alert and oriented to person, place, and time.  Skin: Skin is warm and dry.  Psychiatric: She has a normal mood and affect. Her behavior is normal. Judgment and thought content normal.    Vital signs in last 24 hours:    Labs:   Estimated body mass index is 31.28 kg/m as calculated from the following:   Height as of 02/28/19: '5\' 5"'$  (1.651 m).   Weight as of 02/28/19: 85.3 kg.   Imaging Review Plain radiographs demonstrate AP Rosen were lateral and sunrise x-rays show near bone-on-bone arthritic changes lateral compartments of both knees and the right knee has bone-on-bone arthritic changes to the medial facet of the patella on the medial femoral condyle on the sunrise view.   Assessment/Plan:  End stage arthritis, right knee   The patient history, physical examination, clinical judgment of the provider and imaging studies are consistent with end stage degenerative joint disease of the right knee(s) and total knee arthroplasty is deemed medically necessary. The treatment options including medical management, injection therapy arthroscopy and arthroplasty were discussed at length. The risks and benefits of total knee arthroplasty were presented and reviewed. The risks due to aseptic loosening,  infection, stiffness, patella tracking problems, thromboembolic complications and  other imponderables were discussed. The patient acknowledged the explanation, agreed to proceed with the plan and consent was signed. Patient is being admitted for inpatient treatment for surgery, pain control, PT, OT, prophylactic antibiotics, VTE prophylaxis, progressive ambulation and ADL's and discharge planning. The patient is planning to be discharged home with home health services     Patient's anticipated LOS is less than 2 midnights, meeting these requirements: - Younger than 47 - Lives within 1 hour of care - Has a competent adult at home to recover with post-op recover - NO history of  - Chronic pain requiring opiods  - Diabetes  - Coronary Artery Disease  - Heart failure  - Heart attack  - Stroke  - DVT/VTE  - Cardiac arrhythmia  - Respiratory Failure/COPD  - Renal failure  - Anemia  - Advanced Liver disease

## 2019-03-02 ENCOUNTER — Encounter (HOSPITAL_COMMUNITY): Payer: Self-pay | Admitting: Anesthesiology

## 2019-03-02 DIAGNOSIS — Z9889 Other specified postprocedural states: Secondary | ICD-10-CM | POA: Insufficient documentation

## 2019-03-02 DIAGNOSIS — R112 Nausea with vomiting, unspecified: Secondary | ICD-10-CM | POA: Insufficient documentation

## 2019-03-02 LAB — NOVEL CORONAVIRUS, NAA (HOSP ORDER, SEND-OUT TO REF LAB; TAT 18-24 HRS): SARS-CoV-2, NAA: NOT DETECTED

## 2019-03-02 NOTE — Anesthesia Preprocedure Evaluation (Addendum)
Anesthesia Evaluation  Patient identified by MRN, date of birth, ID band  Reviewed: Allergy & Precautions, NPO status , Patient's Chart, lab work & pertinent test results  History of Anesthesia Complications (+) PONVNegative for: history of anesthetic complications  Airway Mallampati: II  TM Distance: >3 FB Neck ROM: Full    Dental no notable dental hx.    Pulmonary neg pulmonary ROS,    Pulmonary exam normal breath sounds clear to auscultation       Cardiovascular hypertension, Pt. on medications Normal cardiovascular exam Rhythm:Regular Rate:Normal     Neuro/Psych  Headaches, PSYCHIATRIC DISORDERS Anxiety Depression vertigo    GI/Hepatic Neg liver ROS, GERD  Medicated and Controlled,IBS   Endo/Other  diabetes, Type 2Hypothyroidism Obesity BMI 31  Renal/GU negative Renal ROS  negative genitourinary   Musculoskeletal  (+) Arthritis , Fibromyalgia -, narcotic dependent  Abdominal Normal abdominal exam  (+)   Peds  Hematology  (+) anemia , REFUSES BLOOD PRODUCTS,   Anesthesia Other Findings   Reproductive/Obstetrics negative OB ROS                            Anesthesia Physical Anesthesia Plan  ASA: III  Anesthesia Plan: Spinal and Regional   Post-op Pain Management:  Regional for Post-op pain   Induction:   PONV Risk Score and Plan: 3 and Treatment may vary due to age or medical condition, Propofol infusion, Midazolam and TIVA  Airway Management Planned: Natural Airway and Simple Face Mask  Additional Equipment: None  Intra-op Plan:   Post-operative Plan:   Informed Consent: I have reviewed the patients History and Physical, chart, labs and discussed the procedure including the risks, benefits and alternatives for the proposed anesthesia with the patient or authorized representative who has indicated his/her understanding and acceptance.       Plan Discussed with:  CRNA  Anesthesia Plan Comments:        Anesthesia Quick Evaluation

## 2019-03-03 MED ORDER — TRANEXAMIC ACID 1000 MG/10ML IV SOLN
2000.0000 mg | INTRAVENOUS | Status: DC
  Filled 2019-03-03: qty 20

## 2019-03-03 MED ORDER — BUPIVACAINE LIPOSOME 1.3 % IJ SUSP
20.0000 mL | Freq: Once | INTRAMUSCULAR | Status: DC
  Filled 2019-03-03: qty 20

## 2019-03-04 ENCOUNTER — Other Ambulatory Visit: Payer: Self-pay

## 2019-03-04 ENCOUNTER — Encounter (HOSPITAL_COMMUNITY): Payer: Self-pay

## 2019-03-04 ENCOUNTER — Ambulatory Visit (HOSPITAL_COMMUNITY): Payer: Medicare HMO | Admitting: Anesthesiology

## 2019-03-04 ENCOUNTER — Encounter (HOSPITAL_COMMUNITY)
Admission: RE | Disposition: A | Discharge status: Home / Self Care | Payer: Self-pay | Source: Home / Self Care | Attending: Orthopedic Surgery

## 2019-03-04 ENCOUNTER — Observation Stay (HOSPITAL_COMMUNITY)
Admission: RE | Admit: 2019-03-04 | Discharge: 2019-03-06 | Disposition: A | Discharge status: Home / Self Care | Payer: Medicare HMO | Attending: Orthopedic Surgery | Admitting: Orthopedic Surgery

## 2019-03-04 DIAGNOSIS — Z6831 Body mass index (BMI) 31.0-31.9, adult: Secondary | ICD-10-CM | POA: Diagnosis not present

## 2019-03-04 DIAGNOSIS — E559 Vitamin D deficiency, unspecified: Secondary | ICD-10-CM | POA: Insufficient documentation

## 2019-03-04 DIAGNOSIS — Z8249 Family history of ischemic heart disease and other diseases of the circulatory system: Secondary | ICD-10-CM | POA: Insufficient documentation

## 2019-03-04 DIAGNOSIS — E039 Hypothyroidism, unspecified: Secondary | ICD-10-CM | POA: Insufficient documentation

## 2019-03-04 DIAGNOSIS — Z833 Family history of diabetes mellitus: Secondary | ICD-10-CM | POA: Insufficient documentation

## 2019-03-04 DIAGNOSIS — Z888 Allergy status to other drugs, medicaments and biological substances status: Secondary | ICD-10-CM | POA: Insufficient documentation

## 2019-03-04 DIAGNOSIS — Z86010 Personal history of colonic polyps: Secondary | ICD-10-CM | POA: Insufficient documentation

## 2019-03-04 DIAGNOSIS — K219 Gastro-esophageal reflux disease without esophagitis: Secondary | ICD-10-CM | POA: Insufficient documentation

## 2019-03-04 DIAGNOSIS — D509 Iron deficiency anemia, unspecified: Secondary | ICD-10-CM | POA: Insufficient documentation

## 2019-03-04 DIAGNOSIS — F1120 Opioid dependence, uncomplicated: Secondary | ICD-10-CM | POA: Insufficient documentation

## 2019-03-04 DIAGNOSIS — Z79899 Other long term (current) drug therapy: Secondary | ICD-10-CM | POA: Insufficient documentation

## 2019-03-04 DIAGNOSIS — Z818 Family history of other mental and behavioral disorders: Secondary | ICD-10-CM | POA: Insufficient documentation

## 2019-03-04 DIAGNOSIS — E1143 Type 2 diabetes mellitus with diabetic autonomic (poly)neuropathy: Secondary | ICD-10-CM | POA: Diagnosis not present

## 2019-03-04 DIAGNOSIS — Z823 Family history of stroke: Secondary | ICD-10-CM | POA: Insufficient documentation

## 2019-03-04 DIAGNOSIS — E785 Hyperlipidemia, unspecified: Secondary | ICD-10-CM | POA: Diagnosis not present

## 2019-03-04 DIAGNOSIS — M8580 Other specified disorders of bone density and structure, unspecified site: Secondary | ICD-10-CM | POA: Insufficient documentation

## 2019-03-04 DIAGNOSIS — Z7984 Long term (current) use of oral hypoglycemic drugs: Secondary | ICD-10-CM | POA: Diagnosis not present

## 2019-03-04 DIAGNOSIS — G894 Chronic pain syndrome: Secondary | ICD-10-CM | POA: Diagnosis not present

## 2019-03-04 DIAGNOSIS — Z91010 Allergy to peanuts: Secondary | ICD-10-CM | POA: Insufficient documentation

## 2019-03-04 DIAGNOSIS — Z800 Family history of malignant neoplasm of digestive organs: Secondary | ICD-10-CM | POA: Insufficient documentation

## 2019-03-04 DIAGNOSIS — R42 Dizziness and giddiness: Secondary | ICD-10-CM | POA: Insufficient documentation

## 2019-03-04 DIAGNOSIS — F329 Major depressive disorder, single episode, unspecified: Secondary | ICD-10-CM | POA: Diagnosis not present

## 2019-03-04 DIAGNOSIS — Z884 Allergy status to anesthetic agent status: Secondary | ICD-10-CM | POA: Insufficient documentation

## 2019-03-04 DIAGNOSIS — Z7982 Long term (current) use of aspirin: Secondary | ICD-10-CM | POA: Insufficient documentation

## 2019-03-04 DIAGNOSIS — Z853 Personal history of malignant neoplasm of breast: Secondary | ICD-10-CM | POA: Insufficient documentation

## 2019-03-04 DIAGNOSIS — Z91018 Allergy to other foods: Secondary | ICD-10-CM | POA: Insufficient documentation

## 2019-03-04 DIAGNOSIS — Z885 Allergy status to narcotic agent status: Secondary | ICD-10-CM | POA: Insufficient documentation

## 2019-03-04 DIAGNOSIS — Z806 Family history of leukemia: Secondary | ICD-10-CM | POA: Insufficient documentation

## 2019-03-04 DIAGNOSIS — Z791 Long term (current) use of non-steroidal anti-inflammatories (NSAID): Secondary | ICD-10-CM | POA: Insufficient documentation

## 2019-03-04 DIAGNOSIS — Z820 Family history of epilepsy and other diseases of the nervous system: Secondary | ICD-10-CM | POA: Diagnosis not present

## 2019-03-04 DIAGNOSIS — M1711 Unilateral primary osteoarthritis, right knee: Principal | ICD-10-CM | POA: Diagnosis present

## 2019-03-04 DIAGNOSIS — G4700 Insomnia, unspecified: Secondary | ICD-10-CM | POA: Diagnosis not present

## 2019-03-04 DIAGNOSIS — K589 Irritable bowel syndrome without diarrhea: Secondary | ICD-10-CM | POA: Insufficient documentation

## 2019-03-04 DIAGNOSIS — Z9884 Bariatric surgery status: Secondary | ICD-10-CM | POA: Insufficient documentation

## 2019-03-04 DIAGNOSIS — Z811 Family history of alcohol abuse and dependence: Secondary | ICD-10-CM | POA: Insufficient documentation

## 2019-03-04 DIAGNOSIS — M797 Fibromyalgia: Secondary | ICD-10-CM | POA: Diagnosis not present

## 2019-03-04 DIAGNOSIS — Z9049 Acquired absence of other specified parts of digestive tract: Secondary | ICD-10-CM | POA: Insufficient documentation

## 2019-03-04 DIAGNOSIS — Z96651 Presence of right artificial knee joint: Secondary | ICD-10-CM

## 2019-03-04 DIAGNOSIS — F411 Generalized anxiety disorder: Secondary | ICD-10-CM | POA: Insufficient documentation

## 2019-03-04 HISTORY — DX: Other specified postprocedural states: R11.2

## 2019-03-04 HISTORY — DX: Other specified postprocedural states: Z98.890

## 2019-03-04 HISTORY — PX: TOTAL KNEE ARTHROPLASTY: SHX125

## 2019-03-04 LAB — NO BLOOD PRODUCTS

## 2019-03-04 LAB — GLUCOSE, CAPILLARY
Glucose-Capillary: 141 mg/dL — ABNORMAL HIGH (ref 70–99)
Glucose-Capillary: 144 mg/dL — ABNORMAL HIGH (ref 70–99)
Glucose-Capillary: 235 mg/dL — ABNORMAL HIGH (ref 70–99)
Glucose-Capillary: 273 mg/dL — ABNORMAL HIGH (ref 70–99)

## 2019-03-04 LAB — HEMOGLOBIN A1C
Hgb A1c MFr Bld: 6 % — ABNORMAL HIGH (ref 4.8–5.6)
Mean Plasma Glucose: 125.5 mg/dL

## 2019-03-04 SURGERY — ARTHROPLASTY, KNEE, TOTAL
Anesthesia: Regional | Site: Knee | Laterality: Right

## 2019-03-04 MED ORDER — DEXAMETHASONE SODIUM PHOSPHATE 10 MG/ML IJ SOLN
INTRAMUSCULAR | Status: AC
  Filled 2019-03-04: qty 1

## 2019-03-04 MED ORDER — ROSUVASTATIN CALCIUM 10 MG PO TABS
10.0000 mg | ORAL_TABLET | Freq: Every day | ORAL | Status: DC
  Administered 2019-03-04 – 2019-03-05 (×2): 10 mg via ORAL
  Filled 2019-03-04 (×2): qty 1

## 2019-03-04 MED ORDER — FENTANYL CITRATE (PF) 100 MCG/2ML IJ SOLN
INTRAMUSCULAR | Status: AC
  Filled 2019-03-04: qty 2

## 2019-03-04 MED ORDER — MIDAZOLAM HCL 2 MG/2ML IJ SOLN
1.0000 mg | Freq: Once | INTRAMUSCULAR | Status: AC
  Administered 2019-03-04: 2 mg via INTRAVENOUS
  Filled 2019-03-04: qty 2

## 2019-03-04 MED ORDER — HYDROMORPHONE HCL 1 MG/ML IJ SOLN
0.5000 mg | INTRAMUSCULAR | Status: DC | PRN
  Administered 2019-03-04: 1 mg via INTRAVENOUS
  Administered 2019-03-04: 0.5 mg via INTRAVENOUS
  Administered 2019-03-05 (×3): 1 mg via INTRAVENOUS
  Filled 2019-03-04 (×5): qty 1

## 2019-03-04 MED ORDER — FLEET ENEMA 7-19 GM/118ML RE ENEM: 1.0000 | ENEMA | Freq: Once | RECTAL | Status: DC | PRN

## 2019-03-04 MED ORDER — BUPIVACAINE LIPOSOME 1.3 % IJ SUSP
INTRAMUSCULAR | Status: DC | PRN
  Administered 2019-03-04: 20 mL

## 2019-03-04 MED ORDER — POLYETHYLENE GLYCOL 3350 17 GM/SCOOP PO POWD
17.0000 g | Freq: Four times a day (QID) | ORAL | Status: DC
  Filled 2019-03-04: qty 255

## 2019-03-04 MED ORDER — ALUM & MAG HYDROXIDE-SIMETH 200-200-20 MG/5ML PO SUSP: 30.0000 mL | ORAL | Status: DC | PRN

## 2019-03-04 MED ORDER — DEXAMETHASONE SODIUM PHOSPHATE 10 MG/ML IJ SOLN
INTRAMUSCULAR | Status: DC | PRN
  Administered 2019-03-04 (×2): 10 mg via INTRAVENOUS

## 2019-03-04 MED ORDER — SENNA 8.6 MG PO TABS
4.0000 | ORAL_TABLET | Freq: Two times a day (BID) | ORAL | Status: DC
  Administered 2019-03-04 – 2019-03-06 (×4): 34.4 mg via ORAL
  Filled 2019-03-04 (×4): qty 4

## 2019-03-04 MED ORDER — WATER FOR IRRIGATION, STERILE IR SOLN
Status: DC | PRN
  Administered 2019-03-04: 2000 mL

## 2019-03-04 MED ORDER — KCL IN DEXTROSE-NACL 20-5-0.45 MEQ/L-%-% IV SOLN
INTRAVENOUS | Status: DC
  Administered 2019-03-04: 19:00:00 via INTRAVENOUS
  Filled 2019-03-04 (×2): qty 1000

## 2019-03-04 MED ORDER — FENTANYL CITRATE (PF) 100 MCG/2ML IJ SOLN
25.0000 ug | INTRAMUSCULAR | Status: DC | PRN
  Administered 2019-03-04 (×2): 50 ug via INTRAVENOUS

## 2019-03-04 MED ORDER — HYDROCORTISONE (PERIANAL) 2.5 % EX CREA: TOPICAL_CREAM | Freq: Two times a day (BID) | CUTANEOUS | Status: DC | PRN

## 2019-03-04 MED ORDER — METHOCARBAMOL 500 MG IVPB - SIMPLE MED
INTRAVENOUS | Status: AC
  Filled 2019-03-04: qty 50

## 2019-03-04 MED ORDER — BISACODYL 5 MG PO TBEC: 5.0000 mg | DELAYED_RELEASE_TABLET | Freq: Every day | ORAL | Status: DC | PRN

## 2019-03-04 MED ORDER — VITAMIN D 25 MCG (1000 UNIT) PO TABS
5000.0000 [IU] | ORAL_TABLET | Freq: Every day | ORAL | Status: DC
  Administered 2019-03-05 – 2019-03-06 (×2): 5000 [IU] via ORAL
  Filled 2019-03-04 (×2): qty 5

## 2019-03-04 MED ORDER — SODIUM CHLORIDE (PF) 0.9 % IJ SOLN
INTRAMUSCULAR | Status: DC | PRN
  Administered 2019-03-04: 70 mL

## 2019-03-04 MED ORDER — SODIUM CHLORIDE (PF) 0.9 % IJ SOLN
INTRAMUSCULAR | Status: AC
  Filled 2019-03-04: qty 20

## 2019-03-04 MED ORDER — OXYCODONE HCL 5 MG/5ML PO SOLN: 5.0000 mg | Freq: Once | ORAL | Status: DC | PRN

## 2019-03-04 MED ORDER — GABAPENTIN 300 MG PO CAPS
600.0000 mg | ORAL_CAPSULE | Freq: Four times a day (QID) | ORAL | Status: DC
  Administered 2019-03-04 – 2019-03-06 (×7): 600 mg via ORAL
  Filled 2019-03-04 (×7): qty 2

## 2019-03-04 MED ORDER — PHENOL 1.4 % MT LIQD: 1.0000 | OROMUCOSAL | Status: DC | PRN

## 2019-03-04 MED ORDER — FLUTICASONE PROPIONATE 50 MCG/ACT NA SUSP
2.0000 | Freq: Two times a day (BID) | NASAL | Status: DC
  Administered 2019-03-04 – 2019-03-06 (×4): 2 via NASAL
  Filled 2019-03-04: qty 16

## 2019-03-04 MED ORDER — INSULIN ASPART 100 UNIT/ML ~~LOC~~ SOLN: 0.0000 [IU] | Freq: Three times a day (TID) | SUBCUTANEOUS | Status: DC

## 2019-03-04 MED ORDER — DIPHENHYDRAMINE HCL 12.5 MG/5ML PO ELIX: 12.5000 mg | ORAL_SOLUTION | ORAL | Status: DC | PRN

## 2019-03-04 MED ORDER — PROPOFOL 500 MG/50ML IV EMUL
INTRAVENOUS | Status: AC
  Filled 2019-03-04: qty 50

## 2019-03-04 MED ORDER — LINACLOTIDE 145 MCG PO CAPS
290.0000 ug | ORAL_CAPSULE | Freq: Every day | ORAL | Status: DC
  Administered 2019-03-05 – 2019-03-06 (×2): 290 ug via ORAL
  Filled 2019-03-04 (×2): qty 2

## 2019-03-04 MED ORDER — ONDANSETRON HCL 4 MG/2ML IJ SOLN
INTRAMUSCULAR | Status: AC
  Filled 2019-03-04: qty 2

## 2019-03-04 MED ORDER — BUPIVACAINE IN DEXTROSE 0.75-8.25 % IT SOLN
INTRATHECAL | Status: DC | PRN
  Administered 2019-03-04: 1.7 mL via INTRATHECAL

## 2019-03-04 MED ORDER — METHOCARBAMOL 500 MG PO TABS
500.0000 mg | ORAL_TABLET | Freq: Four times a day (QID) | ORAL | Status: DC | PRN
  Administered 2019-03-04 – 2019-03-06 (×2): 500 mg via ORAL
  Filled 2019-03-04 (×2): qty 1

## 2019-03-04 MED ORDER — ONDANSETRON HCL 4 MG PO TABS: 4.0000 mg | ORAL_TABLET | Freq: Four times a day (QID) | ORAL | Status: DC | PRN

## 2019-03-04 MED ORDER — TRANEXAMIC ACID-NACL 1000-0.7 MG/100ML-% IV SOLN
1000.0000 mg | Freq: Once | INTRAVENOUS | Status: AC
  Administered 2019-03-04: 1000 mg via INTRAVENOUS
  Filled 2019-03-04: qty 100

## 2019-03-04 MED ORDER — HYDROXYZINE HCL 25 MG PO TABS
50.0000 mg | ORAL_TABLET | Freq: Every day | ORAL | Status: DC
  Administered 2019-03-04 – 2019-03-05 (×2): 50 mg via ORAL
  Filled 2019-03-04 (×2): qty 2

## 2019-03-04 MED ORDER — ROPIVACAINE HCL 5 MG/ML IJ SOLN
INTRAMUSCULAR | Status: DC | PRN
  Administered 2019-03-04: 20 mL via PERINEURAL

## 2019-03-04 MED ORDER — ACETAMINOPHEN 500 MG PO TABS
1000.0000 mg | ORAL_TABLET | Freq: Once | ORAL | Status: AC
  Administered 2019-03-04: 1000 mg via ORAL
  Filled 2019-03-04: qty 2

## 2019-03-04 MED ORDER — ONDANSETRON HCL 4 MG/2ML IJ SOLN
INTRAMUSCULAR | Status: DC | PRN
  Administered 2019-03-04: 4 mg via INTRAVENOUS

## 2019-03-04 MED ORDER — FENTANYL CITRATE (PF) 100 MCG/2ML IJ SOLN
50.0000 ug | Freq: Once | INTRAMUSCULAR | Status: AC
  Administered 2019-03-04: 100 ug via INTRAVENOUS
  Filled 2019-03-04: qty 2

## 2019-03-04 MED ORDER — MENTHOL 3 MG MT LOZG: 1.0000 | LOZENGE | OROMUCOSAL | Status: DC | PRN

## 2019-03-04 MED ORDER — TRANEXAMIC ACID-NACL 1000-0.7 MG/100ML-% IV SOLN
1000.0000 mg | INTRAVENOUS | Status: AC
  Administered 2019-03-04: 1000 mg via INTRAVENOUS
  Filled 2019-03-04: qty 100

## 2019-03-04 MED ORDER — HYDROCODONE-ACETAMINOPHEN 5-325 MG PO TABS
1.0000 | ORAL_TABLET | Freq: Four times a day (QID) | ORAL | Status: DC | PRN
  Administered 2019-03-04 – 2019-03-06 (×6): 2 via ORAL
  Filled 2019-03-04 (×8): qty 2

## 2019-03-04 MED ORDER — TRANEXAMIC ACID 1000 MG/10ML IV SOLN
INTRAVENOUS | Status: DC | PRN
  Administered 2019-03-04: 2000 mg via TOPICAL

## 2019-03-04 MED ORDER — PROMETHAZINE HCL 25 MG/ML IJ SOLN: 6.2500 mg | INTRAMUSCULAR | Status: DC | PRN

## 2019-03-04 MED ORDER — ONDANSETRON HCL 4 MG/2ML IJ SOLN: 4.0000 mg | Freq: Four times a day (QID) | INTRAMUSCULAR | Status: DC | PRN

## 2019-03-04 MED ORDER — CELECOXIB 200 MG PO CAPS
200.0000 mg | ORAL_CAPSULE | Freq: Two times a day (BID) | ORAL | Status: DC
  Administered 2019-03-04 – 2019-03-06 (×4): 200 mg via ORAL
  Filled 2019-03-04 (×4): qty 1

## 2019-03-04 MED ORDER — ASPIRIN 81 MG PO CHEW
81.0000 mg | CHEWABLE_TABLET | Freq: Two times a day (BID) | ORAL | Status: DC
  Administered 2019-03-04 – 2019-03-06 (×4): 81 mg via ORAL
  Filled 2019-03-04 (×4): qty 1

## 2019-03-04 MED ORDER — DOCUSATE SODIUM 100 MG PO CAPS
100.0000 mg | ORAL_CAPSULE | Freq: Two times a day (BID) | ORAL | Status: DC
  Administered 2019-03-04 – 2019-03-06 (×4): 100 mg via ORAL
  Filled 2019-03-04 (×4): qty 1

## 2019-03-04 MED ORDER — PANTOPRAZOLE SODIUM 40 MG PO TBEC
40.0000 mg | DELAYED_RELEASE_TABLET | Freq: Every day | ORAL | Status: DC
  Administered 2019-03-04: 17:00:00 40 mg via ORAL

## 2019-03-04 MED ORDER — BUPIVACAINE-EPINEPHRINE (PF) 0.25% -1:200000 IJ SOLN
INTRAMUSCULAR | Status: DC | PRN
  Administered 2019-03-04: 30 mL via PERINEURAL

## 2019-03-04 MED ORDER — METOCLOPRAMIDE HCL 5 MG PO TABS: 5.0000 mg | ORAL_TABLET | Freq: Three times a day (TID) | ORAL | Status: DC | PRN

## 2019-03-04 MED ORDER — PROPOFOL 10 MG/ML IV BOLUS
INTRAVENOUS | Status: DC | PRN
  Administered 2019-03-04: 40 mg via INTRAVENOUS
  Administered 2019-03-04: 30 mg via INTRAVENOUS
  Administered 2019-03-04: 20 mg via INTRAVENOUS

## 2019-03-04 MED ORDER — LEVOTHYROXINE SODIUM 25 MCG PO TABS
25.0000 ug | ORAL_TABLET | Freq: Every day | ORAL | Status: DC
  Administered 2019-03-05 – 2019-03-06 (×2): 25 ug via ORAL
  Filled 2019-03-04 (×2): qty 1

## 2019-03-04 MED ORDER — POVIDONE-IODINE 10 % EX SWAB
2.0000 "application " | Freq: Once | CUTANEOUS | Status: AC
  Administered 2019-03-04: 2 via TOPICAL

## 2019-03-04 MED ORDER — PROPOFOL 500 MG/50ML IV EMUL
INTRAVENOUS | Status: DC | PRN
  Administered 2019-03-04: 100 ug/kg/min via INTRAVENOUS

## 2019-03-04 MED ORDER — HYDROCORTISONE 2.5 % RE CREA: 1.0000 "application " | TOPICAL_CREAM | RECTAL | Status: DC | PRN

## 2019-03-04 MED ORDER — SODIUM CHLORIDE (PF) 0.9 % IJ SOLN
INTRAMUSCULAR | Status: AC
  Filled 2019-03-04: qty 50

## 2019-03-04 MED ORDER — SODIUM CHLORIDE 0.9 % IR SOLN
Status: DC | PRN
  Administered 2019-03-04: 1000 mL

## 2019-03-04 MED ORDER — GABAPENTIN 300 MG PO CAPS: 300.0000 mg | ORAL_CAPSULE | Freq: Three times a day (TID) | ORAL | Status: DC

## 2019-03-04 MED ORDER — SEMAGLUTIDE(0.25 OR 0.5MG/DOS) 2 MG/1.5ML ~~LOC~~ SOPN: 0.5000 mg | PEN_INJECTOR | SUBCUTANEOUS | Status: DC

## 2019-03-04 MED ORDER — OXYCODONE HCL 5 MG PO TABS: 5.0000 mg | ORAL_TABLET | Freq: Once | ORAL | Status: DC | PRN

## 2019-03-04 MED ORDER — OXYCODONE HCL 5 MG PO TABS
5.0000 mg | ORAL_TABLET | ORAL | Status: DC | PRN
  Administered 2019-03-04: 10 mg via ORAL
  Filled 2019-03-04: qty 2

## 2019-03-04 MED ORDER — GABAPENTIN 600 MG PO TABS: 600.0000 mg | ORAL_TABLET | Freq: Every day | ORAL | Status: DC

## 2019-03-04 MED ORDER — PANTOPRAZOLE SODIUM 40 MG PO TBEC
40.0000 mg | DELAYED_RELEASE_TABLET | Freq: Every day | ORAL | Status: DC
  Administered 2019-03-04 – 2019-03-06 (×3): 40 mg via ORAL
  Filled 2019-03-04 (×3): qty 1

## 2019-03-04 MED ORDER — 0.9 % SODIUM CHLORIDE (POUR BTL) OPTIME
TOPICAL | Status: DC | PRN
  Administered 2019-03-04: 1000 mL

## 2019-03-04 MED ORDER — MECLIZINE HCL 25 MG PO TABS: 25.0000 mg | ORAL_TABLET | Freq: Three times a day (TID) | ORAL | Status: DC | PRN

## 2019-03-04 MED ORDER — CHLORHEXIDINE GLUCONATE 4 % EX LIQD: 60.0000 mL | Freq: Once | CUTANEOUS | Status: DC

## 2019-03-04 MED ORDER — METHOCARBAMOL 500 MG IVPB - SIMPLE MED
500.0000 mg | Freq: Four times a day (QID) | INTRAVENOUS | Status: DC | PRN
  Filled 2019-03-04: qty 50

## 2019-03-04 MED ORDER — METOCLOPRAMIDE HCL 5 MG/ML IJ SOLN: 5.0000 mg | Freq: Three times a day (TID) | INTRAMUSCULAR | Status: DC | PRN

## 2019-03-04 MED ORDER — VANCOMYCIN HCL IN DEXTROSE 1-5 GM/200ML-% IV SOLN
1000.0000 mg | INTRAVENOUS | Status: AC
  Administered 2019-03-04: 1000 mg via INTRAVENOUS
  Filled 2019-03-04: qty 200

## 2019-03-04 MED ORDER — LACTATED RINGERS IV SOLN
INTRAVENOUS | Status: DC
  Administered 2019-03-04: 10:00:00 via INTRAVENOUS

## 2019-03-04 MED ORDER — POLYETHYLENE GLYCOL 3350 17 G PO PACK
17.0000 g | PACK | Freq: Four times a day (QID) | ORAL | Status: DC
  Administered 2019-03-04 – 2019-03-06 (×4): 17 g via ORAL
  Filled 2019-03-04 (×4): qty 1

## 2019-03-04 MED ORDER — ACETAMINOPHEN 325 MG PO TABS: 325.0000 mg | ORAL_TABLET | Freq: Four times a day (QID) | ORAL | Status: DC | PRN

## 2019-03-04 MED ORDER — CYANOCOBALAMIN 1000 MCG/ML IJ KIT: 1000.0000 ug | PACK | INTRAMUSCULAR | Status: DC

## 2019-03-04 MED ORDER — FUROSEMIDE 20 MG PO TABS
20.0000 mg | ORAL_TABLET | Freq: Two times a day (BID) | ORAL | Status: DC
  Administered 2019-03-05 – 2019-03-06 (×3): 20 mg via ORAL
  Filled 2019-03-04 (×3): qty 1

## 2019-03-04 MED ORDER — POLYETHYLENE GLYCOL 3350 17 G PO PACK: 17.0000 g | PACK | Freq: Every day | ORAL | Status: DC | PRN

## 2019-03-04 SURGICAL SUPPLY — 50 items
ATTUNE PSFEM RTSZ6 NARCEM KNEE (Femur) ×2 IMPLANT
ATTUNE PSRP INSR SZ6 5 KNEE (Insert) ×2 IMPLANT
BAG DECANTER FOR FLEXI CONT (MISCELLANEOUS) ×2 IMPLANT
BAG ZIPLOCK 12X15 (MISCELLANEOUS) ×2 IMPLANT
BASE TIBIA ATTUNE KNEE SYS SZ6 (Knees) ×1 IMPLANT
BLADE SAG 18X100X1.27 (BLADE) ×2 IMPLANT
BLADE SAW SGTL 11.0X1.19X90.0M (BLADE) ×2 IMPLANT
BLADE SURG SZ10 CARB STEEL (BLADE) ×4 IMPLANT
BNDG ELASTIC 6X10 VLCR STRL LF (GAUZE/BANDAGES/DRESSINGS) ×2 IMPLANT
BNDG ELASTIC 6X15 VLCR STRL LF (GAUZE/BANDAGES/DRESSINGS) ×2 IMPLANT
BOWL SMART MIX CTS (DISPOSABLE) ×2 IMPLANT
CEMENT HV SMART SET (Cement) ×4 IMPLANT
COVER SURGICAL LIGHT HANDLE (MISCELLANEOUS) ×2 IMPLANT
COVER WAND RF STERILE (DRAPES) ×2 IMPLANT
CUFF TOURN SGL QUICK 34 (TOURNIQUET CUFF) ×1
CUFF TRNQT CYL 34X4.125X (TOURNIQUET CUFF) ×1 IMPLANT
DECANTER SPIKE VIAL GLASS SM (MISCELLANEOUS) ×6 IMPLANT
DRAPE U-SHAPE 47X51 STRL (DRAPES) ×2 IMPLANT
DRSG AQUACEL AG ADV 3.5X10 (GAUZE/BANDAGES/DRESSINGS) ×2 IMPLANT
DURAPREP 26ML APPLICATOR (WOUND CARE) ×2 IMPLANT
ELECT REM PT RETURN 15FT ADLT (MISCELLANEOUS) ×2 IMPLANT
GLOVE BIO SURGEON STRL SZ7.5 (GLOVE) ×2 IMPLANT
GLOVE BIO SURGEON STRL SZ8.5 (GLOVE) ×2 IMPLANT
GLOVE BIOGEL PI IND STRL 8 (GLOVE) ×1 IMPLANT
GLOVE BIOGEL PI IND STRL 9 (GLOVE) ×1 IMPLANT
GLOVE BIOGEL PI INDICATOR 8 (GLOVE) ×1
GLOVE BIOGEL PI INDICATOR 9 (GLOVE) ×1
GOWN STRL REUS W/TWL XL LVL3 (GOWN DISPOSABLE) ×4 IMPLANT
HANDPIECE INTERPULSE COAX TIP (DISPOSABLE) ×1
HOOD PEEL AWAY FLYTE STAYCOOL (MISCELLANEOUS) ×6 IMPLANT
KIT TURNOVER KIT A (KITS) ×2 IMPLANT
NEEDLE HYPO 21X1.5 SAFETY (NEEDLE) ×4 IMPLANT
NS IRRIG 1000ML POUR BTL (IV SOLUTION) ×2 IMPLANT
PACK ICE MAXI GEL EZY WRAP (MISCELLANEOUS) ×2 IMPLANT
PACK TOTAL KNEE CUSTOM (KITS) ×2 IMPLANT
PATELLA MEDIAL ATTUN 35MM KNEE (Knees) ×2 IMPLANT
PIN STEINMAN FIXATION KNEE (PIN) ×2 IMPLANT
PIN THREADED HEADED SIGMA (PIN) ×2 IMPLANT
PROTECTOR NERVE ULNAR (MISCELLANEOUS) ×2 IMPLANT
SET HNDPC FAN SPRY TIP SCT (DISPOSABLE) ×1 IMPLANT
SUT VIC AB 1 CTX 36 (SUTURE) ×1
SUT VIC AB 1 CTX36XBRD ANBCTR (SUTURE) ×1 IMPLANT
SUT VIC AB 3-0 CT1 27 (SUTURE) ×3
SUT VIC AB 3-0 CT1 TAPERPNT 27 (SUTURE) ×3 IMPLANT
SYR CONTROL 10ML LL (SYRINGE) ×4 IMPLANT
TIBIA ATTUNE KNEE SYS BASE SZ6 (Knees) ×2 IMPLANT
TRAY FOLEY MTR SLVR 16FR STAT (SET/KITS/TRAYS/PACK) ×2 IMPLANT
WATER STERILE IRR 1000ML POUR (IV SOLUTION) ×4 IMPLANT
WRAP KNEE MAXI GEL POST OP (GAUZE/BANDAGES/DRESSINGS) ×2 IMPLANT
YANKAUER SUCT BULB TIP 10FT TU (MISCELLANEOUS) ×2 IMPLANT

## 2019-03-04 NOTE — Transfer of Care (Signed)
Immediate Anesthesia Transfer of Care Note  Patient: Kayla Mayo  Procedure(s) Performed: RIGH TOTAL KNEE ARTHROPLASTY (Right Knee)  Patient Location: PACU  Anesthesia Type:Spinal  Level of Consciousness: awake, alert  and oriented  Airway & Oxygen Therapy: Patient Spontanous Breathing and Patient connected to face mask oxygen  Post-op Assessment: Report given to RN and Post -op Vital signs reviewed and stable  Post vital signs: Reviewed and stable  Last Vitals:  Vitals Value Taken Time  BP 104/67 03/04/19 1402  Temp    Pulse 68 03/04/19 1408  Resp 17 03/04/19 1409  SpO2 100 % 03/04/19 1408  Vitals shown include unvalidated device data.  Last Pain:  Vitals:   03/04/19 1402  TempSrc:   PainSc: (P) 0-No pain         Complications: No apparent anesthesia complications

## 2019-03-04 NOTE — Plan of Care (Signed)
Plan of care 

## 2019-03-04 NOTE — Discharge Instructions (Signed)

## 2019-03-04 NOTE — Interval H&P Note (Signed)
History and Physical Interval Note:  03/04/2019 10:05 AM  Kayla Mayo  has presented today for surgery, with the diagnosis of RIGHT KNEE OSTEOARTHRITIS.  The various methods of treatment have been discussed with the patient and family. After consideration of risks, benefits and other options for treatment, the patient has consented to  Procedure(s): RIGH TOTAL KNEE ARTHROPLASTY (Right) as a surgical intervention.  The patient's history has been reviewed, patient examined, no change in status, stable for surgery.  I have reviewed the patient's chart and labs.  Questions were answered to the patient's satisfaction.     Kerin Salen

## 2019-03-04 NOTE — Anesthesia Postprocedure Evaluation (Signed)
Anesthesia Post Note  Patient: Kayla Mayo  Procedure(s) Performed: RIGH TOTAL KNEE ARTHROPLASTY (Right Knee)     Patient location during evaluation: PACU Anesthesia Type: Regional and Spinal Level of consciousness: oriented and awake and alert Pain management: pain level controlled Vital Signs Assessment: post-procedure vital signs reviewed and stable Respiratory status: spontaneous breathing and respiratory function stable Cardiovascular status: blood pressure returned to baseline and stable Postop Assessment: no headache, no backache, no apparent nausea or vomiting and patient able to bend at knees Anesthetic complications: no    Last Vitals:  Vitals:   03/04/19 1405 03/04/19 1415  BP:  117/82  Pulse: 73 91  Resp: 15 17  Temp:    SpO2: 100% 95%    Last Pain:  Vitals:   03/04/19 1402  TempSrc:   PainSc: 0-No pain                 Pervis Hocking

## 2019-03-04 NOTE — Op Note (Signed)
PATIENT ID:      Kayla Mayo  MRN:     [id number] DOB/AGE:    [birth year] / 63 y.o.       OPERATIVE REPORT   DATE OF PROCEDURE:  03/04/2019      PREOPERATIVE DIAGNOSIS:   RIGHT KNEE OSTEOARTHRITIS      Estimated body mass index is 31.28 kg/m as calculated from the following:   Height as of 02/28/19: 5\' 5"  (1.651 m).   Weight as of 02/28/19: 85.3 kg.                                                       POSTOPERATIVE DIAGNOSIS:   Same                                                          PROCEDURE:  Procedure(s): RIGH TOTAL KNEE ARTHROPLASTY Using DepuyAttune RP implants #6NR Femur, #6Tibia, 5 mm Attune RP bearing, 35 Patella    SURGEON: Kerin Salen  ASSISTANT:   Kerry Hough. [employer]   (Present and scrubbed throughout the case, critical for assistance with exposure, retraction, instrumentation, and closure.)        ANESTHESIA: Spinal, 20cc Exparel, 50cc 0.25% Marcaine EBL: 300 cc FLUID REPLACEMENT: 1600 cc crystaloid TOURNIQUET: DRAINS: None TRANEXAMIC ACID: 1gm IV, 2gm topical COMPLICATIONS:  None         INDICATIONS FOR PROCEDURE: The patient has  RIGHT KNEE OSTEOARTHRITIS, Var deformities, XR shows bone on bone arthritis, lateral subluxation of tibia. Patient has failed all conservative measures including anti-inflammatory medicines, narcotics, attempts at exercise and weight loss, cortisone injections and viscosupplementation.  Risks and benefits of surgery have been discussed, questions answered.   DESCRIPTION OF PROCEDURE: The patient identified by armband, received  IV antibiotics, in the holding area at [facility]. Patient taken to the operating room, appropriate anesthetic monitors were attached, and Spinal anesthesia was  induced. IV Tranexamic acid was given.Tourniquet applied high to the operative thigh. Lateral post and foot positioner applied to the table, the lower extremity was then prepped and draped in usual sterile fashion from the toes to the  tourniquet. Time-out procedure was performed. The skin and subcutaneous tissue along the incision was injected with 20 cc of a mixture of Exparel and Marcaine solution, using a 20-gauge by 1-1/2 inch needle. We began the operation, with the knee flexed 130 degrees, by making the anterior midline incision starting at handbreadth above the patella going over the patella 1 cm medial to and 4 cm distal to the tibial tubercle. Small bleeders in the skin and the subcutaneous tissue identified and cauterized. Transverse retinaculum was incised and reflected medially and a medial parapatellar arthrotomy was accomplished. the patella was everted and theprepatellar fat pad resected. The superficial medial collateral ligament was then elevated from anterior to posterior along the proximal flare of the tibia and anterior half of the menisci resected. The knee was hyperflexed exposing bone on bone arthritis. Peripheral and notch osteophytes as well as the cruciate ligaments were then resected. We continued to work our way around posteriorly along the proximal tibia, and externally rotated the tibia subluxing  it out from underneath the femur. A McHale PCL retractor was placed through the notch and a lateral Hohmann retractor placed, and we then entered the proximal tibia in line with the Depuy starter drill in line with the axis of the tibia followed by an intramedullary guide rod and 0-degree posterior slope cutting guide. The tibial cutting guide, 4 degree posterior sloped, was pinned into place allowing resection of 5 mm of bone medially and 8 mm of bone laterally. Satisfied with the tibial resection, we then entered the distal femur 2 mm anterior to the PCL origin with the intramedullary guide rod and applied the distal femoral cutting guide set at 9 mm, with 5 degrees of valgus. This was pinned along the epicondylar axis. At this point, the distal femoral cut was accomplished without difficulty. We then sized for a #6NR  femoral component and pinned the guide in 3 degrees of external rotation. The chamfer cutting guide was pinned into place. The anterior, posterior, and chamfer cuts were accomplished without difficulty followed by the Attune RP box cutting guide and the box cut. We also removed posterior osteophytes from the posterior femoral condyles. The posterior capsule was injected with Exparel solution. The knee was brought into full extension. We checked our extension gap and fit a 5 mm bearing. Distracting in extension with a lamina spreader,  bleeders in the posterior capsule, Posterior medial and posterior lateral gutter were cauterized.  The transexamic acid-soaked sponge was then placed in the gap of the knee in extension. The knee was flexed 30. The posterior patella cut was accomplished with the 9.5 mm Attune cutting guide, sized for a 78mm dome, and the fixation pegs drilled.The knee was then once again hyperflexed exposing the proximal tibia. We sized for a # 6 tibial base plate, applied the smokestack and the conical reamer followed by the the Delta fin keel punch. We then hammered into place the Attune RP trial femoral component, drilled the lugs, inserted a  5 mm trial bearing, trial patellar button, and took the knee through range of motion from 0-130 degrees. Medial and lateral ligamentous stability was checked. No thumb pressure was required for patellar Tracking. The tourniquet was not used. All trial components were removed, mating surfaces irrigated with pulse lavage, and dried with suction and sponges. 10 cc of the Exparel solution was applied to the cancellus bone of the patella distal femur and proximal tibia.  After waiting 30 seconds, the bony surfaces were again, dried with sponges. A double batch of DePuy HV cement was mixed and applied to all bony metallic mating surfaces except for the posterior condyles of the femur itself. In order, we hammered into place the tibial tray and removed excess  cement, the femoral component and removed excess cement. The final Attune RP bearing was inserted, and the knee brought to full extension with compression. The patellar button was clamped into place, and excess cement removed. The knee was held at 30 flexion with compression, while the cement cured. The wound was irrigated out with normal saline solution pulse lavage. The rest of the Exparel was injected into the parapatellar arthrotomy, subcutaneous tissues, and periosteal tissues. The parapatellar arthrotomy was closed with running #1 Vicryl suture. The subcutaneous tissue with 0 and 2-0 undyed Vicryl suture, and the skin with running 3-0 SQ vicryl. An Aquacil and Ace wrap were applied. The patient was taken to recovery room without difficulty.   Kerin Salen 03/04/2019, 7:07 AM

## 2019-03-04 NOTE — Evaluation (Addendum)
Physical Therapy Evaluation Patient Details Name: Kayla Mayo MRN: [id number] DOB: Sep 09, 1955 Today's Date: 03/04/2019   History of Present Illness  Patient is 63 y.o. female s/p Rt TKA on 03/04/19 with PMH significant for vertigo, osteopenia, HTN, HLD, fibromyalgia, breast cancer, DM, and back surgery.    Clinical Impression  Kayla Mayo is a 63 y.o. female POD 0 s/p Rt TKA. Patient reports modified independence with SPC and RW for mobility at baseline with use of a scooter for long distances. Patient is now limited by functional impairments (see PT problem list below) and requires min assist for transfers with RW. Patient was limited today by pain and declined to ambulate. Patient instructed in exercise to facilitate circulation. Patient will benefit from continued skilled PT interventions to address impairments and progress towards PLOF. Acute PT will follow to progress mobility in preparation for safe discharge home.     Follow Up Recommendations Follow surgeon's recommendation for DC plan and follow-up therapies    Equipment Recommendations  None recommended by PT    Recommendations for Other Services       Precautions / Restrictions Precautions Precautions: Fall Restrictions Weight Bearing Restrictions: No      Mobility  Bed Mobility Overal bed mobility: Needs Assistance Bed Mobility: Supine to Sit     Supine to sit: HOB elevated;Min guard     General bed mobility comments: ceus for use of bed rails, no physical assist required  Transfers Overall transfer level: Needs assistance Equipment used: Rolling walker (2 wheeled) Transfers: Sit to/from [facility] Sit to Stand: Min assist;From elevated surface Stand pivot transfers: Min assist;From elevated surface       General transfer comment: cues for safe hand placement and to assist with power up and steady throughout rise, assist required for safe management of RW and to cues for sequencing  step pattern  Ambulation/Gait          General Gait Details: pt declined due to pain  Stairs            Wheelchair Mobility    Modified Rankin (Stroke Patients Only)       Balance Overall balance assessment: Needs assistance Sitting-balance support: Feet unsupported Sitting balance-Leahy Scale: Good     Standing balance support: During functional activity;Bilateral upper extremity supported Standing balance-Leahy Scale: Poor            Pertinent Vitals/Pain Pain Assessment: 0-10 Pain Score: 8  Pain Location: Rt Knee Pain Descriptors / Indicators: Aching;Sore Pain Intervention(s): Limited activity within patient's tolerance;Monitored during session;Ice applied;Repositioned    Home Living Family/patient expects to be discharged to:: Private residence Living Arrangements: Spouse/significant other Available Help at Discharge: Family Type of Home: House Home Access: Level entry;Stairs to enter;Ramped entrance Entrance Stairs-Rails: None Entrance Stairs-Number of Steps: has a threshold Home Layout: One level Home Equipment: Cane - single point;Walker - 2 wheels;Shower seat;Toilet riser;Electric scooter      Prior Function Level of Independence: Independent with assistive device(s)         Comments: using SPC to walk around and used motorized scooter at food store     Hand Dominance   Dominant Hand: Right    Extremity/Trunk Assessment   Upper Extremity Assessment Upper Extremity Assessment: Overall WFL for tasks assessed    Lower Extremity Assessment Lower Extremity Assessment: Overall WFL for tasks assessed;RLE deficits/detail RLE Deficits / Details: pt with good quad activaiton, unable to perform SLR, no bucklign in weight bearing RLE: Unable to fully assess  due to pain RLE Sensation: WNL RLE Coordination: WNL    Cervical / Trunk Assessment Cervical / Trunk Assessment: Normal  Communication   Communication: No difficulties  Cognition  Arousal/Alertness: Awake/alert Behavior During Therapy: WFL for tasks assessed/performed Overall Cognitive Status: Within Functional Limits for tasks assessed         General Comments      Exercises Total Joint Exercises Ankle Circles/Pumps: AROM;10 reps;Seated;Both   Assessment/Plan    PT Assessment Patient needs continued PT services  PT Problem List Decreased strength;Decreased balance;Decreased mobility;Decreased range of motion;Decreased activity tolerance;Decreased knowledge of use of DME       PT Treatment Interventions DME instruction;Functional mobility training;Balance training;Patient/family education;Modalities;Therapeutic activities;Gait training;Stair training;Therapeutic exercise    PT Goals (Current goals can be found in the Care Plan section)  Acute Rehab PT Goals Patient Stated Goal: none stated by patient PT Goal Formulation: With patient Time For Goal Achievement: 03/04/19 Potential to Achieve Goals: Good    Frequency 7X/week    AM-PAC PT "6 Clicks" Mobility  Outcome Measure Help needed turning from your back to your side while in a flat bed without using bedrails?: A Little Help needed moving from lying on your back to sitting on the side of a flat bed without using bedrails?: A Little Help needed moving to and from a bed to a chair (including a wheelchair)?: A Little Help needed standing up from a chair using your arms (e.g., wheelchair or bedside chair)?: A Little Help needed to walk in hospital room?: A Little Help needed climbing 3-5 steps with a railing? : A Little 6 Click Score: 18    End of Session Equipment Utilized During Treatment: Gait belt Activity Tolerance: Patient tolerated treatment well Patient left: in chair;with call bell/phone within reach;with chair alarm set Nurse Communication: Mobility status PT Visit Diagnosis: Muscle weakness (generalized) (M62.81);Difficulty in walking, not elsewhere classified (R26.2)    Time:  [id number] PT Time Calculation (min) (ACUTE ONLY): 32 min   Charges:   PT Evaluation $PT Eval Low Complexity: 1 Low          Kipp Brood, PT, DPT Physical Therapist with [facility]  03/04/2019 7:26 PM

## 2019-03-04 NOTE — Progress Notes (Signed)
AssistedDr. Beth Finucane with right, ultrasound guided, adductor canal block. Side rails up, monitors on throughout procedure. See vital signs in flow sheet. Tolerated Procedure well.  

## 2019-03-04 NOTE — Anesthesia Procedure Notes (Signed)
Anesthesia Regional Block: Adductor canal block   Pre-Anesthetic Checklist: ,, timeout performed, Correct Patient, Correct Site, Correct Laterality, Correct Procedure, Correct Position, site marked, Risks and benefits discussed,  Surgical consent,  Pre-op evaluation,  At surgeon's request and post-op pain management  Laterality: Right  Prep: Maximum Sterile Barrier Precautions used, chloraprep       Needles:  Injection technique: Single-shot  Needle Type: Echogenic Stimulator Needle     Needle Length: 9cm  Needle Gauge: 22     Additional Needles:   Procedures:,,,, ultrasound used (permanent image in chart),,,,  Narrative:  Start time: 03/04/2019 11:30 AM End time: 03/04/2019 11:37 AM Injection made incrementally with aspirations every 5 mL.  Performed by: Personally  Anesthesiologist: Pervis Hocking, DO  Additional Notes: Monitors applied. No increased pain on injection. No increased resistance to injection. Injection made in 5cc increments. Good needle visualization. Patient tolerated procedure well.

## 2019-03-04 NOTE — Anesthesia Procedure Notes (Signed)
Spinal  Patient location during procedure: OR Start time: 03/04/2019 12:28 PM Staffing Resident/CRNA: [place], Stephanie C, CRNA Performed: resident/CRNA  Preanesthetic Checklist Completed: patient identified, site marked, surgical consent, pre-op evaluation, timeout performed, IV checked, risks and benefits discussed and monitors and equipment checked Spinal Block Patient position: sitting Prep: site prepped and draped and DuraPrep Patient monitoring: heart rate, cardiac monitor, continuous pulse ox and blood pressure Approach: midline Location: L3-4 Injection technique: single-shot Needle Needle type: Pencan  Needle gauge: 24 G Needle length: 9 cm Assessment Sensory level: T4 Additional Notes IV functioning, monitors applied to pt. Expiration date of kit checked and confirmed to be in date. Sterile prep and drape, hand hygiene and sterile gloved used. Pt was positioned and spine was prepped in sterile fashion. Skin was anesthetized with lidocaine. Free flow of clear CSF obtained prior to injecting local anesthetic into CSF x 1 attempt. Spinal needle aspirated freely following injection. Needle was carefully withdrawn, and pt tolerated procedure well. Loss of motor and sensory on exam post injection.      

## 2019-03-05 ENCOUNTER — Encounter (HOSPITAL_COMMUNITY): Payer: Self-pay | Admitting: Orthopedic Surgery

## 2019-03-05 DIAGNOSIS — M1711 Unilateral primary osteoarthritis, right knee: Secondary | ICD-10-CM | POA: Diagnosis not present

## 2019-03-05 LAB — GLUCOSE, CAPILLARY
Glucose-Capillary: 187 mg/dL — ABNORMAL HIGH (ref 70–99)
Glucose-Capillary: 166 mg/dL — ABNORMAL HIGH (ref 70–99)
Glucose-Capillary: 210 mg/dL — ABNORMAL HIGH (ref 70–99)
Glucose-Capillary: 137 mg/dL — ABNORMAL HIGH (ref 70–99)

## 2019-03-05 LAB — BASIC METABOLIC PANEL
Anion gap: 8 (ref 5–15)
BUN: 9 mg/dL (ref 8–23)
CO2: 25 mmol/L (ref 22–32)
Calcium: 8.5 mg/dL — ABNORMAL LOW (ref 8.9–10.3)
Chloride: 102 mmol/L (ref 98–111)
Creatinine, Ser: 1 mg/dL (ref 0.44–1.00)
GFR calc Af Amer: >60 mL/min (ref >60)
GFR calc non Af Amer: 60 mL/min — ABNORMAL LOW (ref >60)
Glucose, Bld: 243 mg/dL — ABNORMAL HIGH (ref 70–99)
Potassium: 3.6 mmol/L (ref 3.5–5.1)
Sodium: 135 mmol/L (ref 135–145)

## 2019-03-05 LAB — CBC
HCT: 34.6 % — ABNORMAL LOW (ref 36.0–46.0)
Hemoglobin: 10.9 g/dL — ABNORMAL LOW (ref 12.0–15.0)
MCH: 30.4 pg (ref 26.0–34.0)
MCHC: 31.5 g/dL (ref 30.0–36.0)
MCV: 96.6 fL (ref 80.0–100.0)
Platelets: 192 10*3/uL (ref 150–400)
RBC: 3.58 MIL/uL — ABNORMAL LOW (ref 3.87–5.11)
RDW: 12.6 % (ref 11.5–15.5)
WBC: 7.4 10*3/uL (ref 4.0–10.5)
nRBC: 0 % (ref 0.0–0.2)

## 2019-03-05 MED ORDER — CYCLOBENZAPRINE HCL 10 MG PO TABS: 10.0000 mg | ORAL_TABLET | Freq: Three times a day (TID) | ORAL | 0 refills | Status: DC | PRN

## 2019-03-05 MED ORDER — ASPIRIN EC 81 MG PO TBEC: 81.0000 mg | DELAYED_RELEASE_TABLET | Freq: Two times a day (BID) | ORAL | 0 refills | Status: DC

## 2019-03-05 NOTE — Progress Notes (Signed)
Physical Therapy Treatment Patient Details Name: Kayla Mayo MRN: [id number] DOB: 09/18/1955 Today's Date: 03/05/2019    History of Present Illness Patient is 63 y.o. female s/p Rt TKA on 03/04/19 with PMH significant for vertigo, osteopenia, HTN, HLD, fibromyalgia, breast cancer, DM, and back surgery.    PT Comments    Pt is progressing with mobility. She continues to c/o significant pain in the R knee. Will continue to follow and progress activity as tolerated.    Follow Up Recommendations  Follow surgeon's recommendation for DC plan and follow-up therapies     Equipment Recommendations  None recommended by PT    Recommendations for Other Services       Precautions / Restrictions Precautions Precautions: Fall Restrictions Weight Bearing Restrictions: No Other Position/Activity Restrictions: WBAT    Mobility  Bed Mobility Overal bed mobility: Needs Assistance Bed Mobility: Supine to Sit;Sit to Supine           General bed mobility comments: oob in recliner  Transfers Overall transfer level: Needs assistance Equipment used: Rolling walker (2 wheeled) Transfers: Sit to/from Stand Sit to Stand: Min guard;From elevated surface         General transfer comment: Close guard for safety. Cues for safety, hand placement/LE placement.  Ambulation/Gait Ambulation/Gait assistance: Min guard Gait Distance (Feet): 60 Feet Assistive device: Rolling walker (2 wheeled) Gait Pattern/deviations: Step-to pattern;Step-through pattern;Decreased stride length     General Gait Details: Cues for safety, step length. Slow gait speed. Followed with recliner for safety. Recliner used to transport pt back to room due to fatigue, pain.   Stairs             Wheelchair Mobility    Modified Rankin (Stroke Patients Only)       Balance Overall balance assessment: Needs assistance         Standing balance support: Bilateral upper extremity supported Standing  balance-Leahy Scale: Poor                              Cognition Arousal/Alertness: Awake/alert Behavior During Therapy: WFL for tasks assessed/performed Overall Cognitive Status: Within Functional Limits for tasks assessed                                        Exercises Total Joint Exercises Ankle Circles/Pumps: AROM;Both;10 reps;Seated Quad Sets: AROM;Both;10 reps;Seated Hip ABduction/ADduction: AROM;Right;AAROM;10 reps;Seated Straight Leg Raises: AROM;Right;10 reps;Seated Knee Flexion: Right;AROM;10 reps;Seated Goniometric ROM: ~10-65 degrees    General Comments        Pertinent Vitals/Pain Pain Assessment: 0-10 Pain Score: 7  Pain Location: R knee Pain Descriptors / Indicators: Aching;Sore Pain Intervention(s): Premedicated before session;Ice applied;Repositioned    Home Living                      Prior Function            PT Goals (current goals can now be found in the care plan section) Progress towards PT goals: Progressing toward goals    Frequency    7X/week      PT Plan Current plan remains appropriate    Co-evaluation              AM-PAC PT "6 Clicks" Mobility   Outcome Measure  Help needed turning from your back to your side while in a flat bed  without using bedrails?: A Little Help needed moving from lying on your back to sitting on the side of a flat bed without using bedrails?: A Little Help needed moving to and from a bed to a chair (including a wheelchair)?: A Little Help needed standing up from a chair using your arms (e.g., wheelchair or bedside chair)?: A Little Help needed to walk in hospital room?: A Little Help needed climbing 3-5 steps with a railing? : A Little 6 Click Score: 18    End of Session Equipment Utilized During Treatment: Gait belt Activity Tolerance: Patient limited by fatigue;Patient limited by pain Patient left: in chair;with call bell/phone within reach;with chair  alarm set   PT Visit Diagnosis: Muscle weakness (generalized) (M62.81);Difficulty in walking, not elsewhere classified (R26.2)     Time: [id number] PT Time Calculation (min) (ACUTE ONLY): 25 min  Charges:  $Gait Training: 8-22 mins $Therapeutic Exercise: 8-22 mins                        Weston Anna, PT Acute Rehabilitation Services Pager: [phone] Office: [phone]

## 2019-03-05 NOTE — TOC Transition Note (Signed)
Transition of Care [facility]) - CM/SW Discharge Note   Patient Details  Name: TERRIKA ZUVER MRN: [id number] Date of Birth: Dec 19, 1955  [facility]) CM/SW Contact:  Lia Hopping, [place] Phone Number: 03/05/2019, 10:28 AM   Clinical Narrative:    CSW met with the patient at bedside and provided a list of Home Health agencies from [email or web address]. Patient reviewed and chose [facility] w/insurance. CSW confirm available services with Rep. Ronalee Belts.  Patient has RW.    Final next level of care: [place] Barriers to Discharge: No Barriers Identified   Patient Goals and CMS Choice Patient states their goals for this hospitalization and ongoing recovery are:: "Walk without pain" CMS Medicare.gov Compare Post Acute Care list provided to:: Patient Choice offered to / list presented to : Patient  Discharge Placement  Home                      Discharge Plan and Services                          HH Arranged: PT [facility] Agency: [facility] [employer] Date [place]: 03/05/19 Time [place]: 1028 Representative spoke with at [place]: [place] ([place]) Interventions     Readmission Risk Interventions No flowsheet data found.

## 2019-03-05 NOTE — Progress Notes (Signed)
PATIENT ID: Kayla Mayo  MRN: [id number]  DOB/AGE:  01-24-1956 / 63 y.o. y.o.  1 Day Post-Op Procedure(s) (LRB): RIGH TOTAL KNEE ARTHROPLASTY (Right)    PROGRESS NOTE Subjective: Patient is alert, oriented, no Nausea, no Vomiting, yes passing gas. Taking PO well. Denies SOB, Chest or Calf Pain. Using Incentive Spirometer, PAS in place. Ambulate in room, Patient reports pain as 4/10. .    Objective: Vital signs in last 24 hours: Vitals:   03/04/19 1839 03/04/19 1930 03/05/19 0148 03/05/19 0601  BP: 133/86 (Abnormal) 137/92 129/86 115/72  Pulse: 78 66 65 70  Resp: 18 16 16 16   Temp: 98.1 F (36.7 C) 98.1 F (36.7 C) 98.1 F (36.7 C) 98 F (36.7 C)  TempSrc: Oral Oral Oral Oral  SpO2: 100% 100% 100% 99%  Weight:      Height:          Intake/Output from previous day: I/O last 3 completed shifts: In: 2297.9 [P.O.:300; I.V.:1897.9; IV Piggyback:100] Out: 1700 [Urine:1400; Blood:300]   Intake/Output this shift: No intake/output data recorded.   LABORATORY DATA: Recent Labs    03/04/19 1405 03/04/19 1709 03/04/19 2057 03/05/19 0250  WBC  --   --   --  7.4  HGB  --   --   --  10.9*  HCT  --   --   --  34.6*  PLT  --   --   --  192  NA  --   --   --  135  K  --   --   --  3.6  CL  --   --   --  102  CO2  --   --   --  25  BUN  --   --   --  9  CREATININE  --   --   --  1.00  GLUCOSE  --   --   --  243*  GLUCAP 144* 235* 273*  --   CALCIUM  --   --   --  8.5*    Examination: Neurologically intact ABD soft Neurovascular intact Sensation intact distally Intact pulses distally Dorsiflexion/Plantar flexion intact Incision: dressing C/D/I No cellulitis present Compartment soft} Patient can actively straight leg raise  Assessment:   1 Day Post-Op Procedure(s) (LRB): RIGH TOTAL KNEE ARTHROPLASTY (Right) ADDITIONAL DIAGNOSIS: Expected Acute Blood Loss Anemia, morbid obesity, HTN, NIDDM  Patient's anticipated LOS is less than 2 midnights, meeting these  requirements: - Younger than 62 - Lives within 1 hour of care - Has a competent adult at home to recover with post-op recover - NO history of  - Chronic pain requiring opiods  - Diabetes  - Coronary Artery Disease  - Heart failure  - Heart attack  - Stroke  - DVT/VTE  - Cardiac arrhythmia  - Respiratory Failure/COPD  - Renal failure  - Anemia  - Advanced Liver disease       Plan: PT/OT WBAT, AROM and PROM DVT Prophylaxis:  SCDx72hrs, ASA 81 mg BID x 2 weeks DISCHARGE PLAN: Home,If passes PT today. DISCHARGE NEEDS: HHPT, Walker and 3-in-1 comode seat     Kerin Salen 03/05/2019, 7:45 AM

## 2019-03-05 NOTE — Progress Notes (Signed)
Physical Therapy Treatment Patient Details Name: Kayla Mayo MRN: [id number] DOB: 08/24/1955 Today's Date: 03/05/2019    History of Present Illness Patient is 63 y.o. female s/p Rt TKA on 03/04/19 with PMH significant for vertigo, osteopenia, HTN, HLD, fibromyalgia, breast cancer, DM, and back surgery.    PT Comments    Progressing with mobility. Will continue to progress activity as tolerated. Pt should be ready for d/c home on tomorrow.    Follow Up Recommendations  Follow surgeon's recommendation for DC plan and follow-up therapies     Equipment Recommendations  None recommended by PT    Recommendations for Other Services       Precautions / Restrictions Precautions Precautions: Fall Restrictions Weight Bearing Restrictions: No Other Position/Activity Restrictions: WBAT    Mobility  Bed Mobility Overal bed mobility: Needs Assistance Bed Mobility: Supine to Sit;Sit to Supine     Supine to sit: Min guard;HOB elevated Sit to supine: Min assist;HOB elevated   General bed mobility comments: Assist for R LE back onto bed. Increased time.  Transfers Overall transfer level: Needs assistance Equipment used: Rolling walker (2 wheeled) Transfers: Sit to/from Stand Sit to Stand: Min guard;From elevated surface         General transfer comment: Close guard for safety. Cues for safety, hand placement/LE placement.  Ambulation/Gait Ambulation/Gait assistance: Min guard Gait Distance (Feet): 70 Feet Assistive device: Rolling walker (2 wheeled) Gait Pattern/deviations: Step-to pattern;Step-through pattern;Decreased stride length     General Gait Details: Cues for safety, step length. Slow gait speed.   Stairs             Wheelchair Mobility    Modified Rankin (Stroke Patients Only)       Balance Overall balance assessment: Needs assistance;History of Falls         Standing balance support: Bilateral upper extremity supported Standing  balance-Leahy Scale: Poor                              Cognition Arousal/Alertness: Awake/alert Behavior During Therapy: WFL for tasks assessed/performed Overall Cognitive Status: Within Functional Limits for tasks assessed                                        Exercises     General Comments        Pertinent Vitals/Pain Pain Assessment: 0-10 Pain Score: 7  Pain Location: R knee Pain Descriptors / Indicators: Aching;Sore Pain Intervention(s): Monitored during session;Premedicated before session;Ice applied    Home Living                      Prior Function            PT Goals (current goals can now be found in the care plan section) Progress towards PT goals: Progressing toward goals    Frequency    7X/week      PT Plan Current plan remains appropriate    Co-evaluation              AM-PAC PT "6 Clicks" Mobility   Outcome Measure  Help needed turning from your back to your side while in a flat bed without using bedrails?: A Little Help needed moving from lying on your back to sitting on the side of a flat bed without using bedrails?: A Little Help needed moving to  and from a bed to a chair (including a wheelchair)?: A Little Help needed standing up from a chair using your arms (e.g., wheelchair or bedside chair)?: A Little Help needed to walk in hospital room?: A Little Help needed climbing 3-5 steps with a railing? : A Little 6 Click Score: 18    End of Session Equipment Utilized During Treatment: Gait belt Activity Tolerance: Patient limited by fatigue;Patient limited by pain Patient left: in bed;with call bell/phone within reach;with family/visitor present   PT Visit Diagnosis: Muscle weakness (generalized) (M62.81);Difficulty in walking, not elsewhere classified (R26.2)     Time: [id number] PT Time Calculation (min) (ACUTE ONLY): 10 min  Charges:  $Gait Training: 8-22 mins $Therapeutic Exercise: 8-22  mins                       Weston Anna, PT Acute Rehabilitation Services Pager: [phone] Office: [phone]

## 2019-03-06 DIAGNOSIS — M1711 Unilateral primary osteoarthritis, right knee: Secondary | ICD-10-CM | POA: Diagnosis not present

## 2019-03-06 LAB — CBC
HCT: 29.7 % — ABNORMAL LOW (ref 36.0–46.0)
Hemoglobin: 9.5 g/dL — ABNORMAL LOW (ref 12.0–15.0)
MCH: 30.6 pg (ref 26.0–34.0)
MCHC: 32 g/dL (ref 30.0–36.0)
MCV: 95.8 fL (ref 80.0–100.0)
Platelets: 155 10*3/uL (ref 150–400)
RBC: 3.1 MIL/uL — ABNORMAL LOW (ref 3.87–5.11)
RDW: 12.9 % (ref 11.5–15.5)
WBC: 7 10*3/uL (ref 4.0–10.5)
nRBC: 0 % (ref 0.0–0.2)

## 2019-03-06 LAB — GLUCOSE, CAPILLARY: Glucose-Capillary: 108 mg/dL — ABNORMAL HIGH (ref 70–99)

## 2019-03-06 MED ORDER — HYDROCODONE-ACETAMINOPHEN 5-325 MG PO TABS
1.0000 | ORAL_TABLET | ORAL | Status: DC | PRN
  Administered 2019-03-06: 2 via ORAL
  Filled 2019-03-06: qty 2

## 2019-03-06 NOTE — Discharge Summary (Signed)
Patient ID: EVERLINE MAHAFFY MRN: [id number] DOB/AGE: [birth year] 63 y.o.  Admit date: 03/04/2019 Discharge date: 03/06/2019  Admission Diagnoses:  Principal Problem:   Osteoarthritis of right knee Active Problems:   S/P total knee replacement, right   Discharge Diagnoses:  Same  Past Medical History:  Diagnosis Date  . Anal fissure   . Anemia 03/17/2011  . Anxiety   . Arthritis   . Breast cancer ([place])   . Bursitis of hip   . Chronic headaches   . Colon polyps   . Depression   . Diabetes mellitus   . Esophageal spasm   . Fibromyalgia   . Gallstones   . GERD (gastroesophageal reflux disease)   . Hyperlipidemia   . Hypertension   . Hypothyroidism   . IBS (irritable bowel syndrome)   . Neuropathy   . Obesity   . Osteopenia 07/2011   t score -1.5 FRAX 2.1%/0%  . PONV (postoperative nausea and vomiting)   . Status post cardiac catheterization   . Vertigo    Occurs 6 times a month    Surgeries: Procedure(s): RIGH TOTAL KNEE ARTHROPLASTY on 03/04/2019   Consultants:   Discharged Condition: Improved  Hospital Course: JIMMY STIPES is an 63 y.o. female who was admitted 03/04/2019 for operative treatment ofOsteoarthritis of right knee. Patient has severe unremitting pain that affects sleep, daily activities, and work/hobbies. After pre-op clearance the patient was taken to the operating room on 03/04/2019 and underwent  Procedure(s): RIGH TOTAL KNEE ARTHROPLASTY.    Patient was given perioperative antibiotics:  Anti-infectives (From admission, onward)   Start     Dose/Rate Route Frequency Ordered Stop   03/04/19 0915  vancomycin (VANCOCIN) IVPB 1000 mg/200 mL premix     1,000 mg 200 mL/hr over 60 Minutes Intravenous On call to O.R. 03/04/19 0900 03/04/19 1208       Patient was given sequential compression devices, early ambulation, and chemoprophylaxis to prevent DVT.  Patient benefited maximally from hospital stay and there were no complications.    Recent  vital signs:  Patient Vitals for the past 24 hrs:  BP Temp Temp src Pulse Resp SpO2  03/06/19 0510 118/74 98.2 F (36.8 C) Oral 69 16 98 %  03/05/19 2132 133/79 98.2 F (36.8 C) Oral 72 18 100 %  03/05/19 1809 129/77 99.1 F (37.3 C) Oral 84 18 100 %  03/05/19 1309 112/72 98 F (36.7 C) Oral (!) 59 18 100 %  03/05/19 1135 124/77 98.4 F (36.9 C) Oral (!) 57 18 100 %     Recent laboratory studies:  Recent Labs    03/05/19 0250 03/06/19 0237  WBC 7.4 7.0  HGB 10.9* 9.5*  HCT 34.6* 29.7*  PLT 192 155  NA 135  --   K 3.6  --   CL 102  --   CO2 25  --   BUN 9  --   CREATININE 1.00  --   GLUCOSE 243*  --   CALCIUM 8.5*  --      Discharge Medications:   Allergies as of 03/06/2019      Reactions   Morphine And Related Itching   Blood-group Specific Substance    Refuses Blood   Enalapril    cough   Food    Sesame oil, chicken, corn, lactose, peanut, wheat   Peanut-containing Drug Products    Simvastatin    Muscle pain      Medication List    STOP taking these medications  naproxen 500 MG tablet Commonly known as: Naprosyn     TAKE these medications   2-3CC SYRINGE 3 ML Misc 25 Syringes by Does not apply route every 30 (thirty) days.   Accu-Chek FastClix Lancets Misc Use daily as directed for Dx: 250.00   aspirin EC 81 MG tablet Take 1 tablet (81 mg total) by mouth 2 (two) times daily.   Cyanocobalamin 1000 MCG/ML Kit Inject 1,000 mcg as directed every 30 (thirty) days.   cyclobenzaprine 10 MG tablet Commonly known as: FLEXERIL Take 1 tablet (10 mg total) by mouth 3 (three) times daily as needed for muscle spasms.   diclofenac sodium 1 % Gel Commonly known as: VOLTAREN Apply 1 application topically 2 (two) times daily.   fluticasone 50 MCG/ACT nasal spray Commonly known as: FLONASE Place 2 sprays into both nostrils 2 (two) times daily.   furosemide 20 MG tablet Commonly known as: LASIX Take 1 tablet (20 mg total) by mouth 2 (two) times  daily.   gabapentin 600 MG tablet Commonly known as: NEURONTIN Take 600 mg by mouth 5 (five) times daily.   HYDROcodone-acetaminophen 10-325 MG tablet Commonly known as: NORCO Take 1 tablet by mouth every 6 (six) hours.   hydrocortisone 2.5 % rectal cream Commonly known as: Proctosol HC Place 1 application rectally as needed.   hydrOXYzine 25 MG tablet Commonly known as: ATARAX/VISTARIL Take 50 mg by mouth at bedtime.   levothyroxine 25 MCG tablet Commonly known as: SYNTHROID Take 25 mcg by mouth at bedtime.   linaclotide 290 MCG Caps capsule Commonly known as: LINZESS Take 290 mcg by mouth daily.   meclizine 25 MG tablet Commonly known as: ANTIVERT Take 25 mg by mouth 3 (three) times daily as needed for dizziness.   omeprazole 40 MG capsule Commonly known as: PRILOSEC Take 40 mg by mouth daily.   OZEMPIC (0.25 OR 0.5 MG/DOSE) [place] Inject 0.5 mg into the skin every Friday.   polyethylene glycol powder 17 GM/SCOOP powder Commonly known as: GLYCOLAX/MIRALAX Take 17 g by mouth 2 (two) times daily. What changed: when to take this   rosuvastatin 10 MG tablet Commonly known as: CRESTOR Take 10 mg by mouth at bedtime.   senna 8.6 MG tablet Commonly known as: SENOKOT Take 4 tablets by mouth 2 (two) times daily.   SYRINGE-NEEDLE (DISP) 3 ML 23G X 1" 3 ML Misc Commonly known as: Recruitment consultant Syringes 1 mL by Does not apply route every 30 (thirty) days. For B12 home administration   Vitamin D3 125 MCG (5000 UT) Caps Take 5,000 Units by mouth daily.            Durable Medical Equipment  (From admission, onward)         Start     Ordered   03/04/19 1646  DME Walker rolling  Once    Question:  Patient needs a walker to treat with the following condition  Answer:  Status post right knee replacement   03/04/19 1645   03/04/19 1646  DME 3 n 1  Once     03/04/19 1645           Discharge Care Instructions  (From admission, onward)         Start      Ordered   03/06/19 0000  Weight bearing as tolerated     03/06/19 3007          Diagnostic Studies: Dg Chest 2 View  Result Date: 02/28/2019 CLINICAL DATA:  Preoperative  for knee surgery.  Diabetes mellitus. EXAM: CHEST - 2 VIEW COMPARISON:  November 27, 2008 FINDINGS: There is no edema or consolidation. Heart size and pulmonary vascularity are normal. No adenopathy. There is mild degenerative change in the thoracic spine. IMPRESSION: No edema or consolidation. Electronically Signed   By: Lowella Grip III M.D.   On: 02/28/2019 10:28    Disposition: Discharge disposition: 01-Home or Self Care       Discharge Instructions    Call MD / Call 911   Complete by: As directed    If you experience chest pain or shortness of breath, CALL 911 and be transported to the hospital emergency room.  If you develope a fever above 101 F, pus (white drainage) or increased drainage or redness at the wound, or calf pain, call your surgeon's office.   Constipation Prevention   Complete by: As directed    Drink plenty of fluids.  Prune juice may be helpful.  You may use a stool softener, such as Colace (over the counter) 100 mg twice a day.  Use MiraLax (over the counter) for constipation as needed.   Diet - low sodium heart healthy   Complete by: As directed    Driving restrictions   Complete by: As directed    No driving for 2 weeks   Increase activity slowly as tolerated   Complete by: As directed    Patient may shower   Complete by: As directed    You may shower without a dressing once there is no drainage.  Do not wash over the wound.  If drainage remains, cover wound with plastic wrap and then shower.   Weight bearing as tolerated   Complete by: As directed       Follow-up Information    Frederik Pear, MD In 2 weeks.   Specialty: Orthopedic Surgery Contact information: [place] [address] [phone]        Home, [facility] Follow up.   Specialty: [facility] Contact information: [address] [place] [address] [phone]            Signed: Joanell Rising 03/06/2019, 9:22 AM

## 2019-03-06 NOTE — Progress Notes (Signed)
PATIENT ID: Kayla Mayo  MRN: [id number]  DOB/AGE:  [birth year] / 63 y.o.  2 Days Post-Op Procedure(s) (LRB): RIGH TOTAL KNEE ARTHROPLASTY (Right)    PROGRESS NOTE Subjective: Patient is alert, oriented, no Nausea, no Vomiting, yes passing gas. Taking PO well. Denies SOB, Chest or Calf Pain. Using Incentive Spirometer, PAS in place. Ambulate WBAT with pt walking 70 ft with therapy, Patient reports pain as 5/10 .    Objective: Vital signs in last 24 hours: Vitals:   03/05/19 1309 03/05/19 1809 03/05/19 2132 03/06/19 0510  BP: 112/72 129/77 133/79 118/74  Pulse: (!) 59 84 72 69  Resp: 18 18 18 16   Temp: 98 F (36.7 C) 99.1 F (37.3 C) 98.2 F (36.8 C) 98.2 F (36.8 C)  TempSrc: Oral Oral Oral Oral  SpO2: 100% 100% 100% 98%  Weight:      Height:          Intake/Output from previous day: I/O last 3 completed shifts: In: 1834.7 [P.O.:1020; I.V.:814.7] Out: 1625 [Urine:1625]   Intake/Output this shift: No intake/output data recorded.   LABORATORY DATA: Recent Labs    03/05/19 0250  03/05/19 1733 03/05/19 2029 03/06/19 0237 03/06/19 0729  WBC 7.4  --   --   --  7.0  --   HGB 10.9*  --   --   --  9.5*  --   HCT 34.6*  --   --   --  29.7*  --   PLT 192  --   --   --  155  --   NA 135  --   --   --   --   --   K 3.6  --   --   --   --   --   CL 102  --   --   --   --   --   CO2 25  --   --   --   --   --   BUN 9  --   --   --   --   --   CREATININE 1.00  --   --   --   --   --   GLUCOSE 243*  --   --   --   --   --   GLUCAP  --    < > 210* 137*  --  108*  CALCIUM 8.5*  --   --   --   --   --    < > = values in this interval not displayed.    Examination: Neurologically intact Neurovascular intact Sensation intact distally Intact pulses distally Dorsiflexion/Plantar flexion intact Incision: dressing C/D/I and no drainage No cellulitis present Compartment soft}  Assessment:   2 Days Post-Op Procedure(s) (LRB): RIGH TOTAL KNEE ARTHROPLASTY  (Right) ADDITIONAL DIAGNOSIS: Expected Acute Blood Loss Anemia, Diabetes and Hypertension  Patient's anticipated LOS is less than 2 midnights, meeting these requirements: - Younger than 22 - Lives within 1 hour of care - Has a competent adult at home to recover with post-op recover - NO history of  - Chronic pain requiring opiods  - Coronary Artery Disease  - Heart failure  - Heart attack  - Stroke  - DVT/VTE  - Cardiac arrhythmia  - Respiratory Failure/COPD  - Renal failure  - Anemia  - Advanced Liver disease       Plan: PT/OT WBAT, AROM and PROM  DVT Prophylaxis:  SCDx72hrs, ASA 81 mg BID x 2 weeks  DISCHARGE PLAN: Home DISCHARGE NEEDS: HHPT, Walker and 3-in-1 comode seat     Joanell Rising 03/06/2019, 9:20 AM

## 2019-03-06 NOTE — Progress Notes (Signed)
Physical Therapy Treatment Patient Details Name: Kayla Mayo MRN: [id number] DOB: 1956-03-18 Today's Date: 03/06/2019    History of Present Illness Patient is 63 y.o. female s/p Rt TKA on 03/04/19 with PMH significant for vertigo, osteopenia, HTN, HLD, fibromyalgia, breast cancer, DM, and back surgery.    PT Comments    Progressing with mobility. Reviewed exercises and gait training. All education completed. Okay to d/c from PT standpoint.    Follow Up Recommendations  Follow surgeon's recommendation for DC plan and follow-up therapies     Equipment Recommendations  None recommended by PT    Recommendations for Other Services       Precautions / Restrictions Precautions Precautions: Fall Restrictions Weight Bearing Restrictions: No RLE Weight Bearing: Weight bearing as tolerated    Mobility  Bed Mobility Overal bed mobility: Needs Assistance Bed Mobility: Supine to Sit;Sit to Supine     Supine to sit: Min guard;HOB elevated Sit to supine: Min guard;HOB elevated   General bed mobility comments: Pt uses UE to assist R LE.  Transfers Overall transfer level: Needs assistance Equipment used: Rolling walker (2 wheeled) Transfers: Sit to/from Stand Sit to Stand: Min guard         General transfer comment: Close guard for safety. Cues for safety, hand placement/LE placement.  Ambulation/Gait Ambulation/Gait assistance: Min guard Gait Distance (Feet): 115 Feet Assistive device: Rolling walker (2 wheeled) Gait Pattern/deviations: Step-to pattern;Step-through pattern;Decreased stride length     General Gait Details: for safety. slow gait speed.   Stairs             Wheelchair Mobility    Modified Rankin (Stroke Patients Only)       Balance                                            Cognition Arousal/Alertness: Awake/alert Behavior During Therapy: WFL for tasks assessed/performed Overall Cognitive Status: Within Functional  Limits for tasks assessed                                        Exercises Total Joint Exercises Ankle Circles/Pumps: AROM;Both;10 reps;Supine Quad Sets: AROM;Both;10 reps;Supine Heel Slides: AAROM;10 reps;Supine;Right Hip ABduction/ADduction: AAROM;Right;10 reps;Supine Straight Leg Raises: AAROM;Right;10 reps;Supine Goniometric ROM: ~10-70 degrees    General Comments        Pertinent Vitals/Pain Pain Assessment: 0-10 Pain Score: 6  Pain Location: R knee Pain Descriptors / Indicators: Aching;Sore;Discomfort Pain Intervention(s): Monitored during session;Ice applied;Repositioned    Home Living                      Prior Function            PT Goals (current goals can now be found in the care plan section) Progress towards PT goals: Progressing toward goals    Frequency    7X/week      PT Plan Current plan remains appropriate    Co-evaluation              AM-PAC PT "6 Clicks" Mobility   Outcome Measure  Help needed turning from your back to your side while in a flat bed without using bedrails?: A Little Help needed moving from lying on your back to sitting on the side of a flat bed without using  bedrails?: A Little Help needed moving to and from a bed to a chair (including a wheelchair)?: A Little Help needed standing up from a chair using your arms (e.g., wheelchair or bedside chair)?: A Little Help needed to walk in hospital room?: A Little Help needed climbing 3-5 steps with a railing? : A Little 6 Click Score: 18    End of Session Equipment Utilized During Treatment: Gait belt Activity Tolerance: Patient tolerated treatment well Patient left: with call bell/phone within reach;in bed   PT Visit Diagnosis: Muscle weakness (generalized) (M62.81);Other abnormalities of gait and mobility (R26.89)     Time: 1011-1030 PT Time Calculation (min) (ACUTE ONLY): 19 min  Charges:  $Gait Training: 8-22 mins                        Weston Anna, PT Acute Rehabilitation Services Pager: [phone] Office: [phone]

## 2019-03-13 IMAGING — MG DIGITAL SCREENING BILATERAL MAMMOGRAM WITH CAD
4 series · 4 of 4 positions shown · non-contrast
Comparison: Previous exam(s).

CLINICAL DATA: Screening.

EXAM:
DIGITAL SCREENING BILATERAL MAMMOGRAM WITH CAD

[R CC]
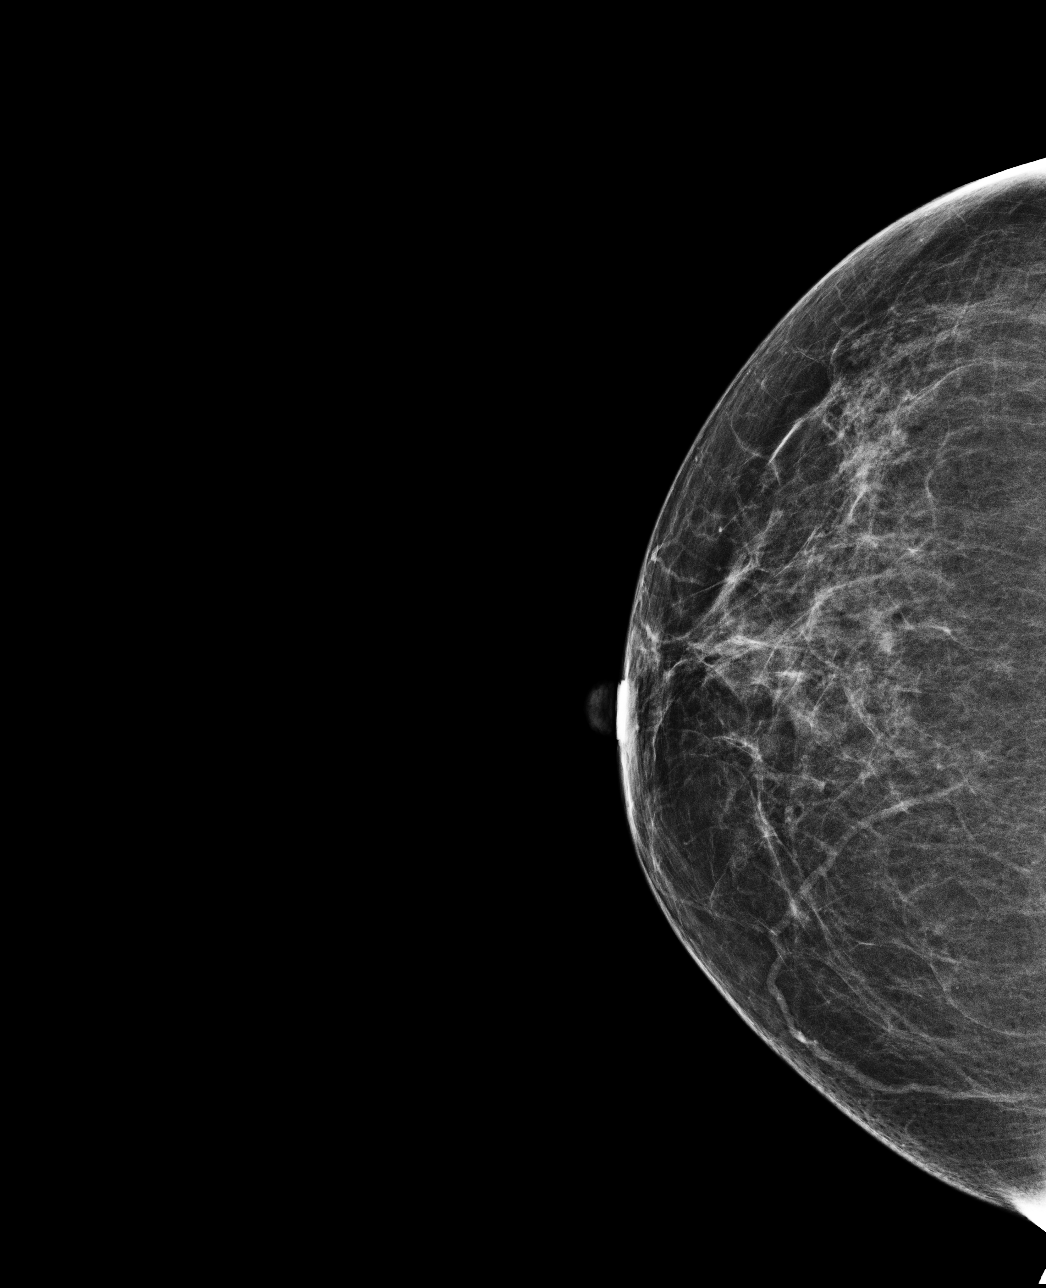

[R MLO]
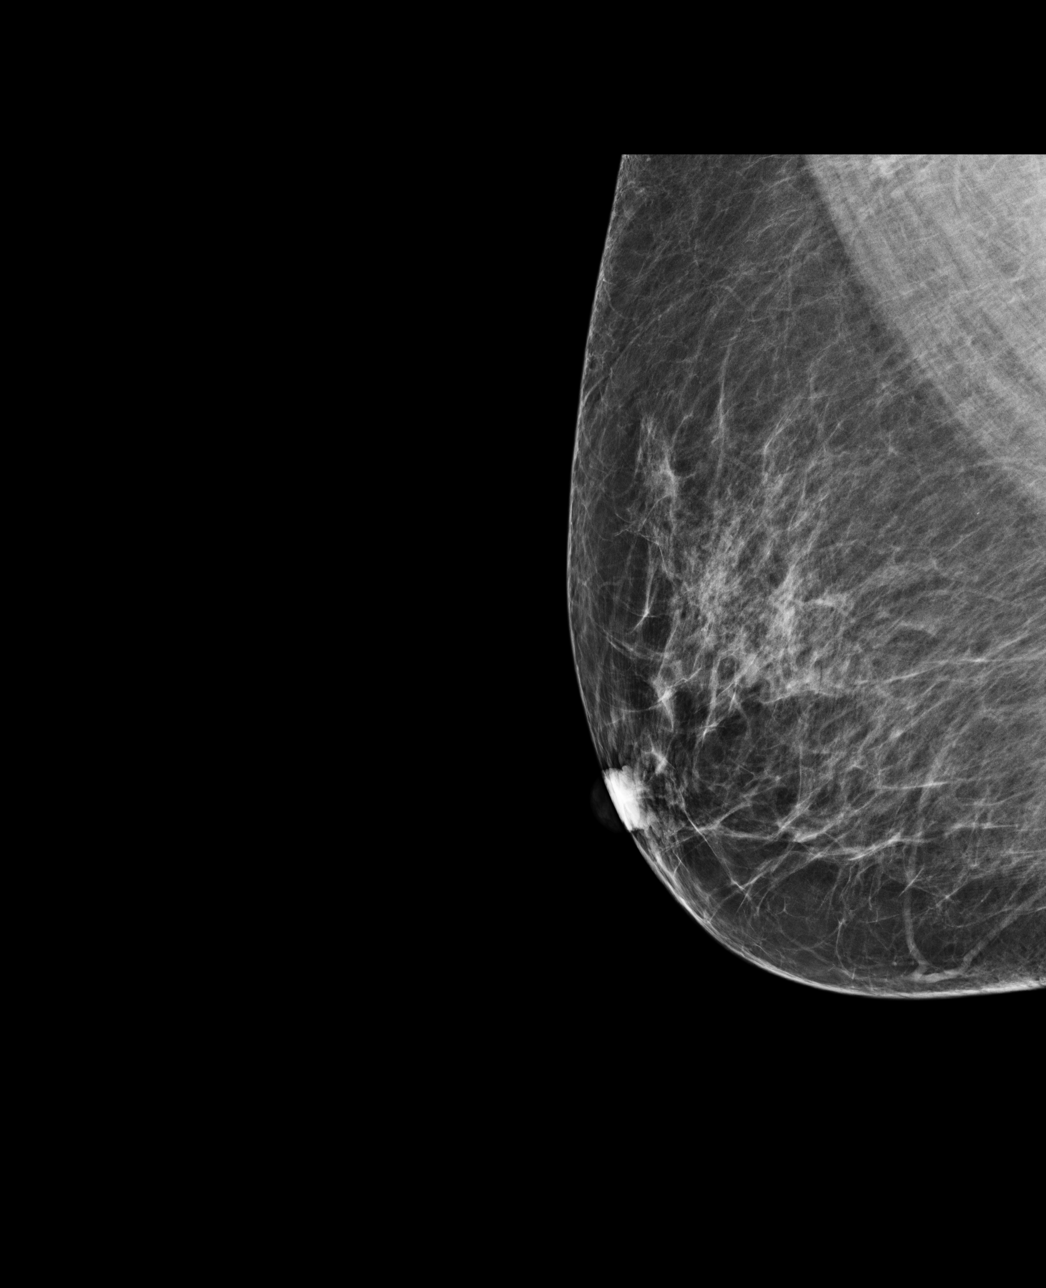

[L CC]
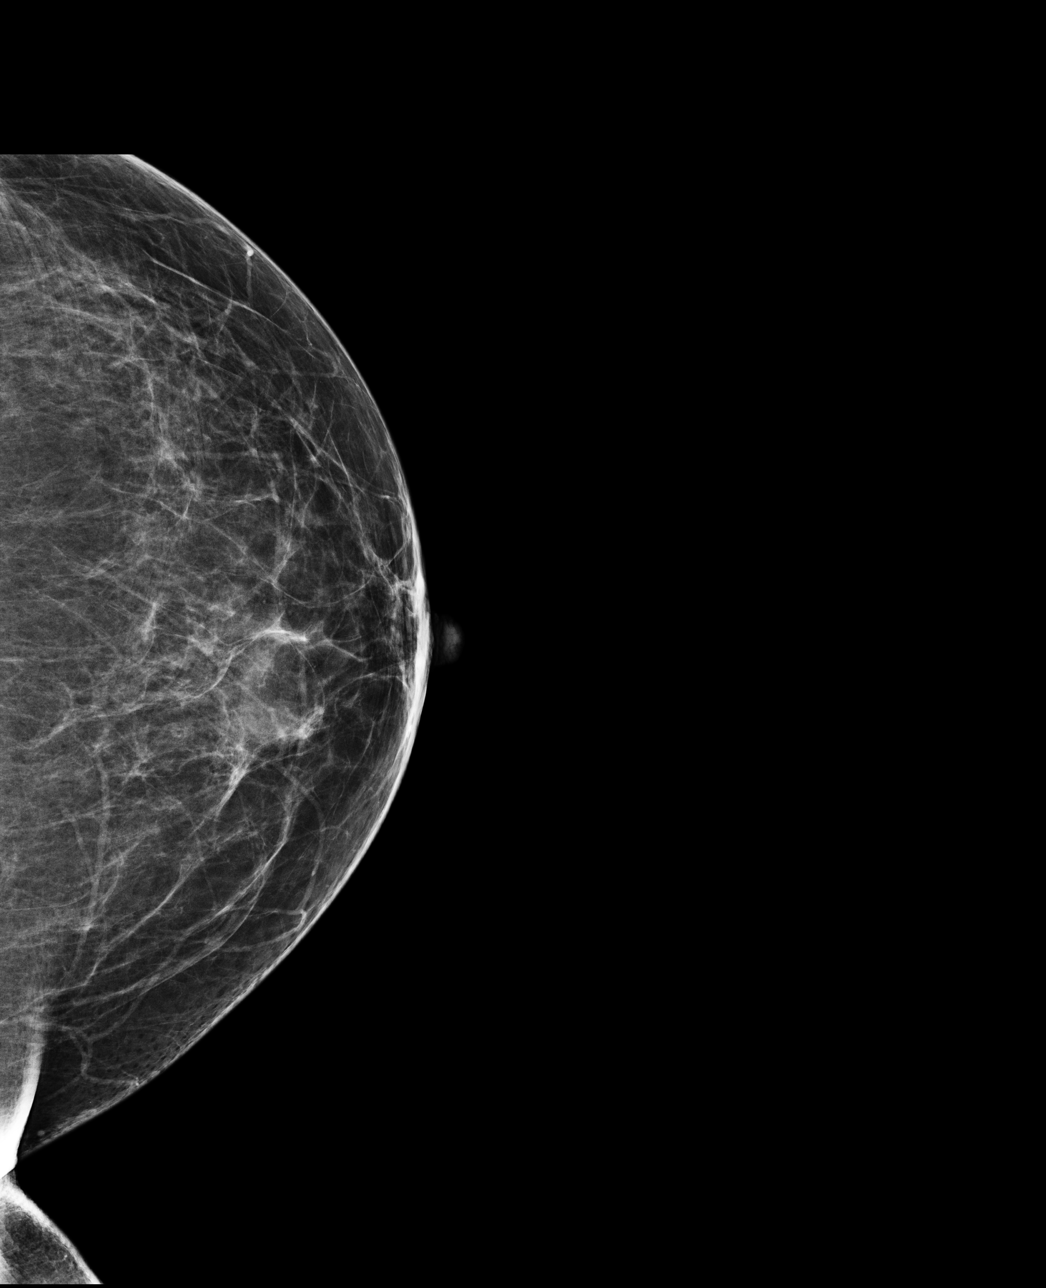

[L MLO]
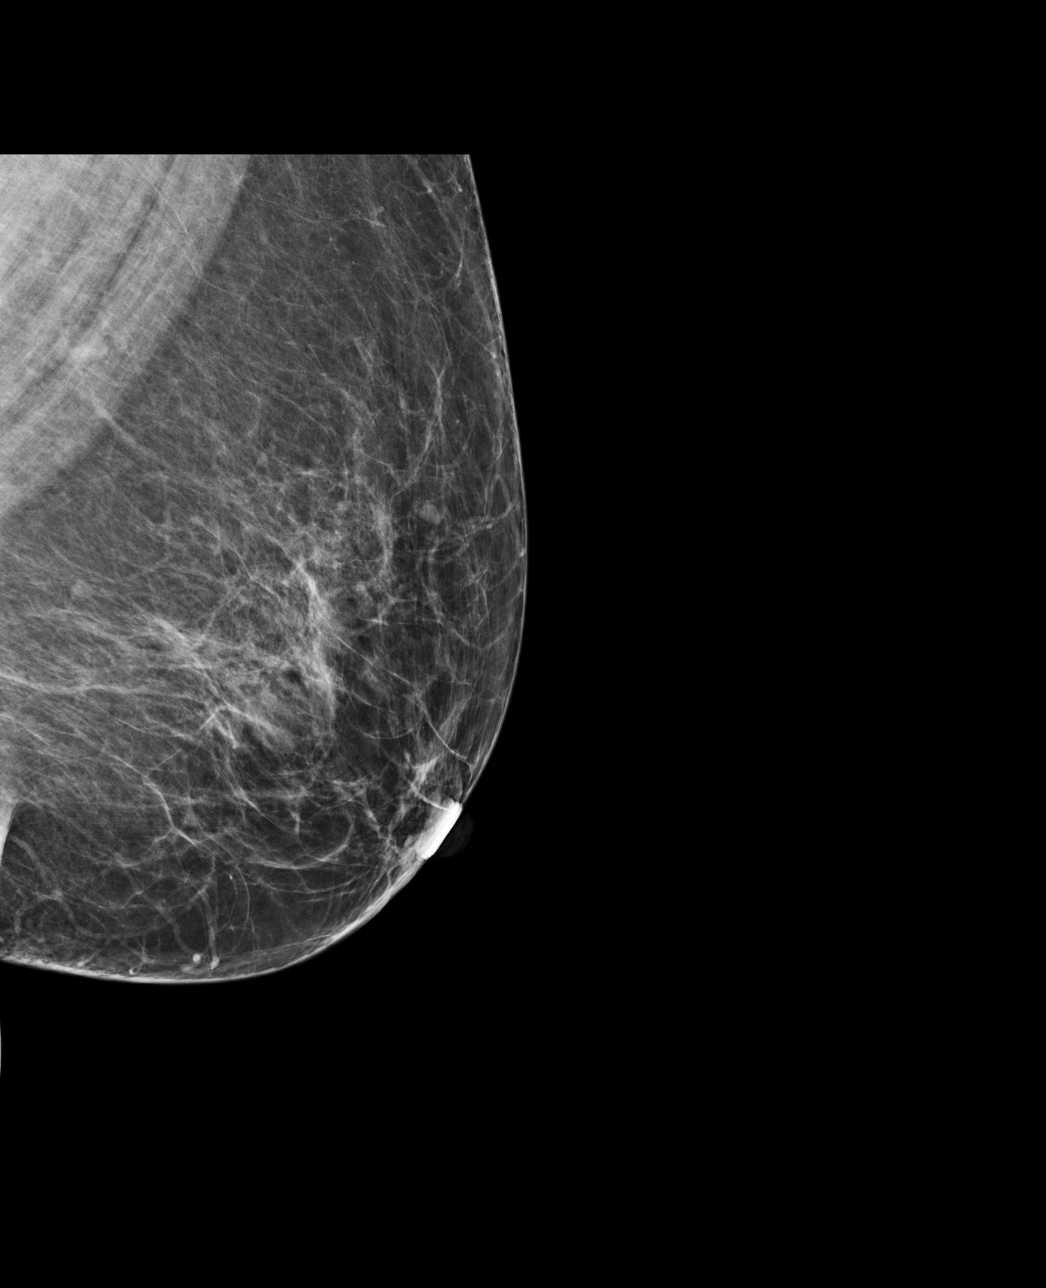

[4 of 4 positions shown; findings below may reference images not displayed]

ACR Breast Density Category b: There are scattered areas of
fibroglandular density.
FINDINGS: There are no findings suspicious for malignancy. Images were
processed with CAD.
IMPRESSION: No mammographic evidence of malignancy. A result letter of this
screening mammogram will be mailed directly to the patient.

RECOMMENDATION:
Screening mammogram in one year. (Code:AS-G-LCT)

BI-RADS CATEGORY  1: Negative.

## 2019-03-29 ENCOUNTER — Other Ambulatory Visit: Payer: Self-pay

## 2019-03-29 ENCOUNTER — Inpatient Hospital Stay: Payer: Medicare HMO | Attending: Hematology

## 2019-03-29 DIAGNOSIS — D508 Other iron deficiency anemias: Secondary | ICD-10-CM | POA: Diagnosis not present

## 2019-03-29 DIAGNOSIS — E559 Vitamin D deficiency, unspecified: Secondary | ICD-10-CM

## 2019-03-29 DIAGNOSIS — D6489 Other specified anemias: Secondary | ICD-10-CM

## 2019-03-29 DIAGNOSIS — D519 Vitamin B12 deficiency anemia, unspecified: Secondary | ICD-10-CM

## 2019-03-29 LAB — COMPREHENSIVE METABOLIC PANEL
ALT: 21 U/L (ref 0–44)
AST: 31 U/L (ref 15–41)
Albumin: 3.6 g/dL (ref 3.5–5.0)
Alkaline Phosphatase: 133 U/L — ABNORMAL HIGH (ref 38–126)
Anion gap: 11 (ref 5–15)
BUN: 11 mg/dL (ref 8–23)
CO2: 28 mmol/L (ref 22–32)
Calcium: 8.9 mg/dL (ref 8.9–10.3)
Chloride: 101 mmol/L (ref 98–111)
Creatinine, Ser: 0.99 mg/dL (ref 0.44–1.00)
GFR calc Af Amer: >60 mL/min (ref >60)
GFR calc non Af Amer: >60 mL/min (ref >60)
Glucose, Bld: 172 mg/dL — ABNORMAL HIGH (ref 70–99)
Potassium: 3.1 mmol/L — ABNORMAL LOW (ref 3.5–5.1)
Sodium: 140 mmol/L (ref 135–145)
Total Bilirubin: 0.7 mg/dL (ref 0.3–1.2)
Total Protein: 7 g/dL (ref 6.5–8.1)

## 2019-03-29 LAB — RETICULOCYTES
Immature Retic Fract: 9.2 % (ref 2.3–15.9)
RBC.: 3.59 MIL/uL — ABNORMAL LOW (ref 3.87–5.11)
Retic Count, Absolute: 112.4 10*3/uL (ref 19.0–186.0)
Retic Ct Pct: 3.1 % (ref 0.4–3.1)

## 2019-03-29 LAB — CBC WITH DIFFERENTIAL/PLATELET
Abs Immature Granulocytes: 0.01 10*3/uL (ref 0.00–0.07)
Basophils Absolute: 0 10*3/uL (ref 0.0–0.1)
Basophils Relative: 1 %
Eosinophils Absolute: 0.2 10*3/uL (ref 0.0–0.5)
Eosinophils Relative: 5 %
HCT: 35.1 % — ABNORMAL LOW (ref 36.0–46.0)
Hemoglobin: 10.9 g/dL — ABNORMAL LOW (ref 12.0–15.0)
Immature Granulocytes: 0 %
Lymphocytes Relative: 33 %
Lymphs Abs: 1.3 10*3/uL (ref 0.7–4.0)
MCH: 30.4 pg (ref 26.0–34.0)
MCHC: 31.1 g/dL (ref 30.0–36.0)
MCV: 97.8 fL (ref 80.0–100.0)
Monocytes Absolute: 0.3 10*3/uL (ref 0.1–1.0)
Monocytes Relative: 7 %
Neutro Abs: 2 10*3/uL (ref 1.7–7.7)
Neutrophils Relative %: 54 %
Platelets: 405 10*3/uL — ABNORMAL HIGH (ref 150–400)
RBC: 3.59 MIL/uL — ABNORMAL LOW (ref 3.87–5.11)
RDW: 13.9 % (ref 11.5–15.5)
WBC: 3.8 10*3/uL — ABNORMAL LOW (ref 4.0–10.5)
nRBC: 0 % (ref 0.0–0.2)

## 2019-03-29 LAB — VITAMIN D 25 HYDROXY (VIT D DEFICIENCY, FRACTURES): Vit D, 25-Hydroxy: 43.34 ng/mL (ref 30–100)

## 2019-03-29 LAB — IRON AND TIBC
Iron: 69 ug/dL (ref 41–142)
Saturation Ratios: 25 % (ref 21–57)
TIBC: 271 ug/dL (ref 236–444)
UIBC: 203 ug/dL (ref 120–384)

## 2019-03-29 LAB — FERRITIN: Ferritin: 412 ng/mL — ABNORMAL HIGH (ref 11–307)

## 2019-03-29 MED ORDER — CYANOCOBALAMIN 1000 MCG/ML IJ KIT: 1000.0000 ug | PACK | INTRAMUSCULAR | 5 refills | Status: DC

## 2019-03-31 ENCOUNTER — Other Ambulatory Visit: Payer: Self-pay | Admitting: Hematology

## 2019-03-31 DIAGNOSIS — D510 Vitamin B12 deficiency anemia due to intrinsic factor deficiency: Secondary | ICD-10-CM

## 2019-04-02 ENCOUNTER — Other Ambulatory Visit: Payer: Self-pay

## 2019-04-02 ENCOUNTER — Telehealth: Payer: Self-pay

## 2019-04-02 DIAGNOSIS — E876 Hypokalemia: Secondary | ICD-10-CM

## 2019-04-02 MED ORDER — POTASSIUM CHLORIDE CRYS ER 20 MEQ PO TBCR: 20.0000 meq | EXTENDED_RELEASE_TABLET | Freq: Every day | ORAL | 2 refills | Status: DC

## 2019-04-02 NOTE — Telephone Encounter (Signed)
-----   Message from Truitt Merle, MD sent at 03/31/2019  5:31 PM EST ----- Please let pt know her lab results, still has mild anemia, iron level is good, check if she is taking B12 injection at home, I will check her B12 level on next lab. K low, check if she is taking KCL, call in KCL 34meq daily if she is not on, or increase by 7meq/day if she is on. Thanks  Truitt Merle  03/31/2019

## 2019-04-02 NOTE — Telephone Encounter (Signed)
Spoke with patient regarding lab results.  Per Dr. Burr Medico notified her that she has mild anemia, iron level is good.  She is still taking B12 injections at home.  We will check B12 next lab.  Also notified her potassium is low, she is not taking potassium currently.  I am sending KCL 20 meq daily to her pharmacy.  She verbalized an understanding.

## 2019-04-16 ENCOUNTER — Other Ambulatory Visit: Payer: Self-pay | Admitting: Orthopedic Surgery

## 2019-04-27 IMAGING — CR DG KNEE COMPLETE 4+V*L*
4 series · 4 of 4 positions shown · non-contrast
Comparison: None.

CLINICAL DATA: 61-year-old female with left knee pain after fall
earlier today.

EXAM:
LEFT KNEE - COMPLETE 4+ VIEW

[t knee ap left]
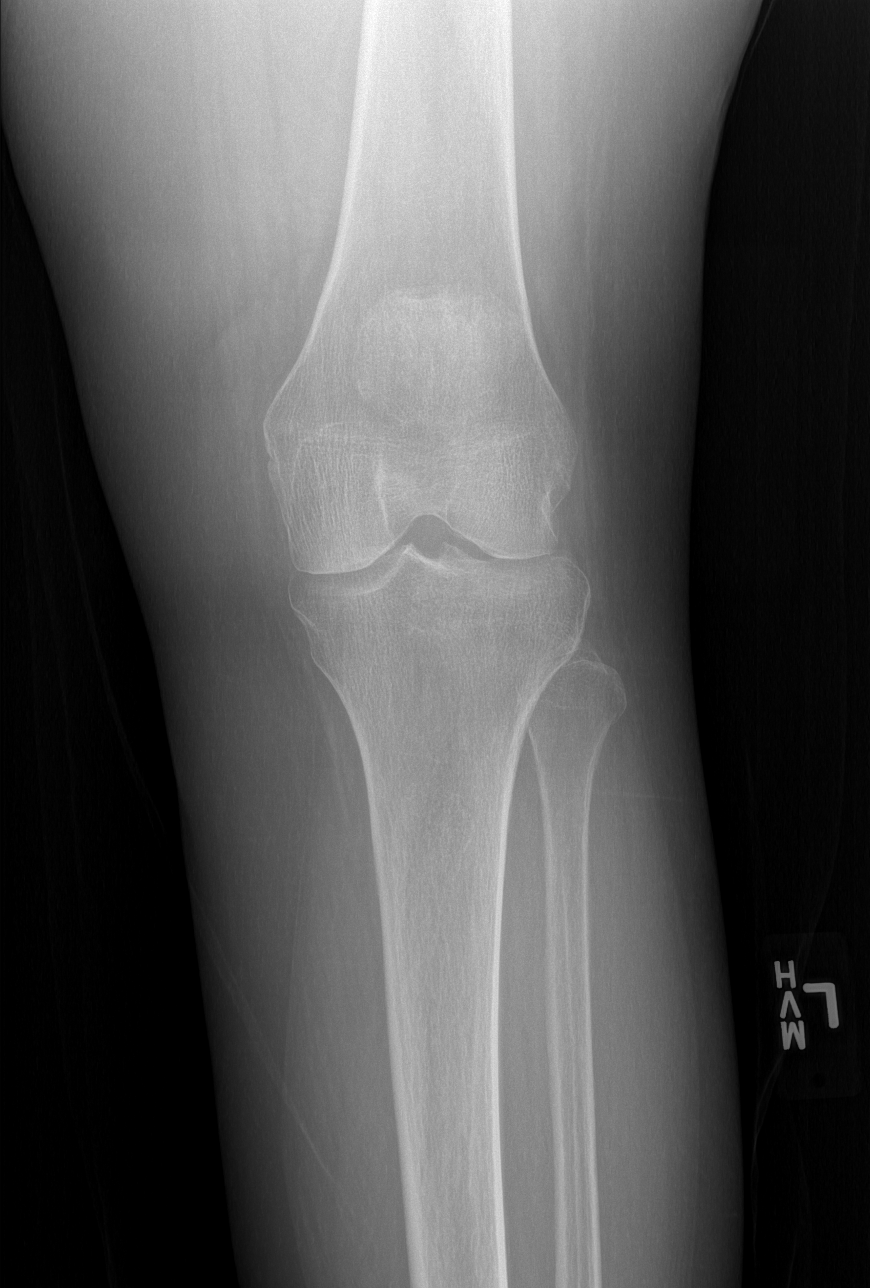

[t knee oblique left (1 of 2)]
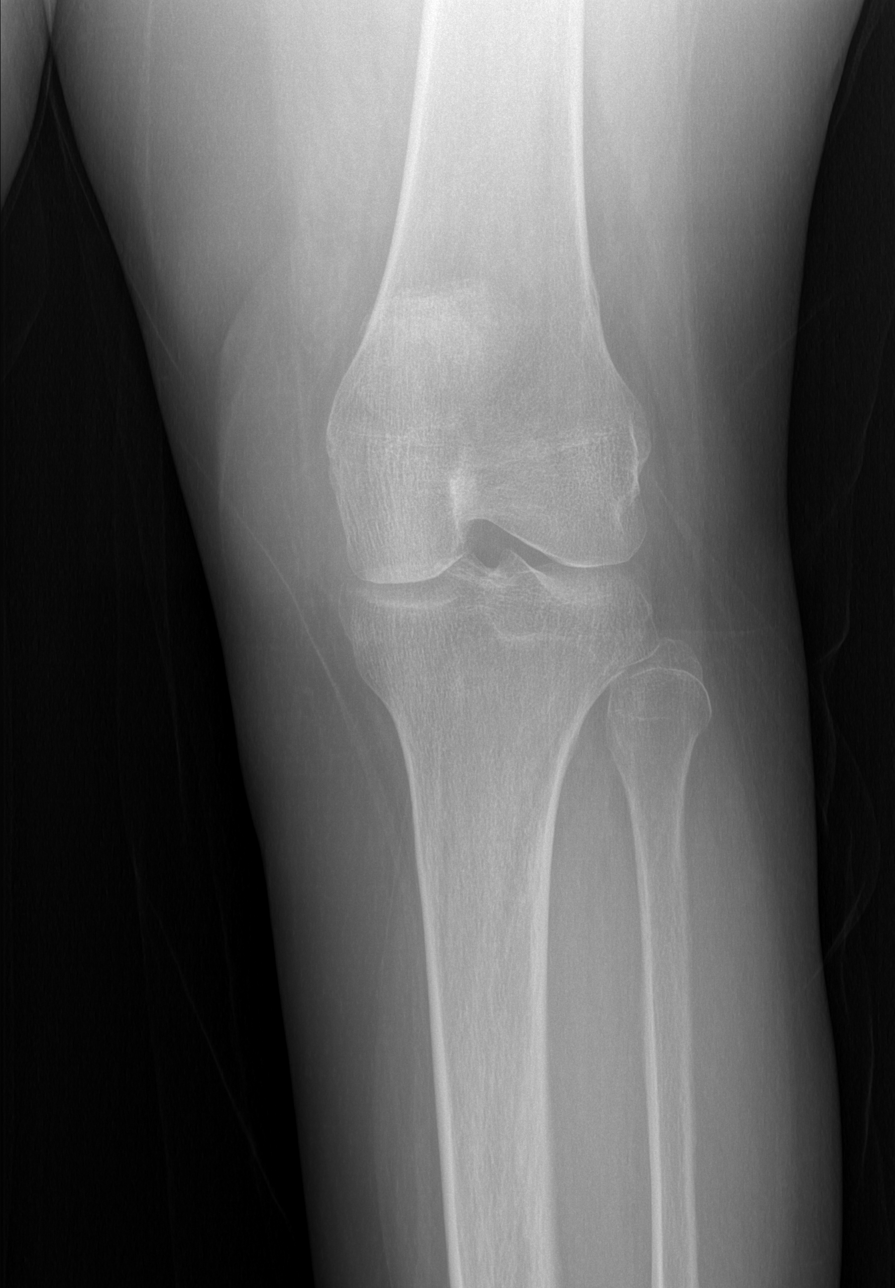

[t knee oblique left (2 of 2)]
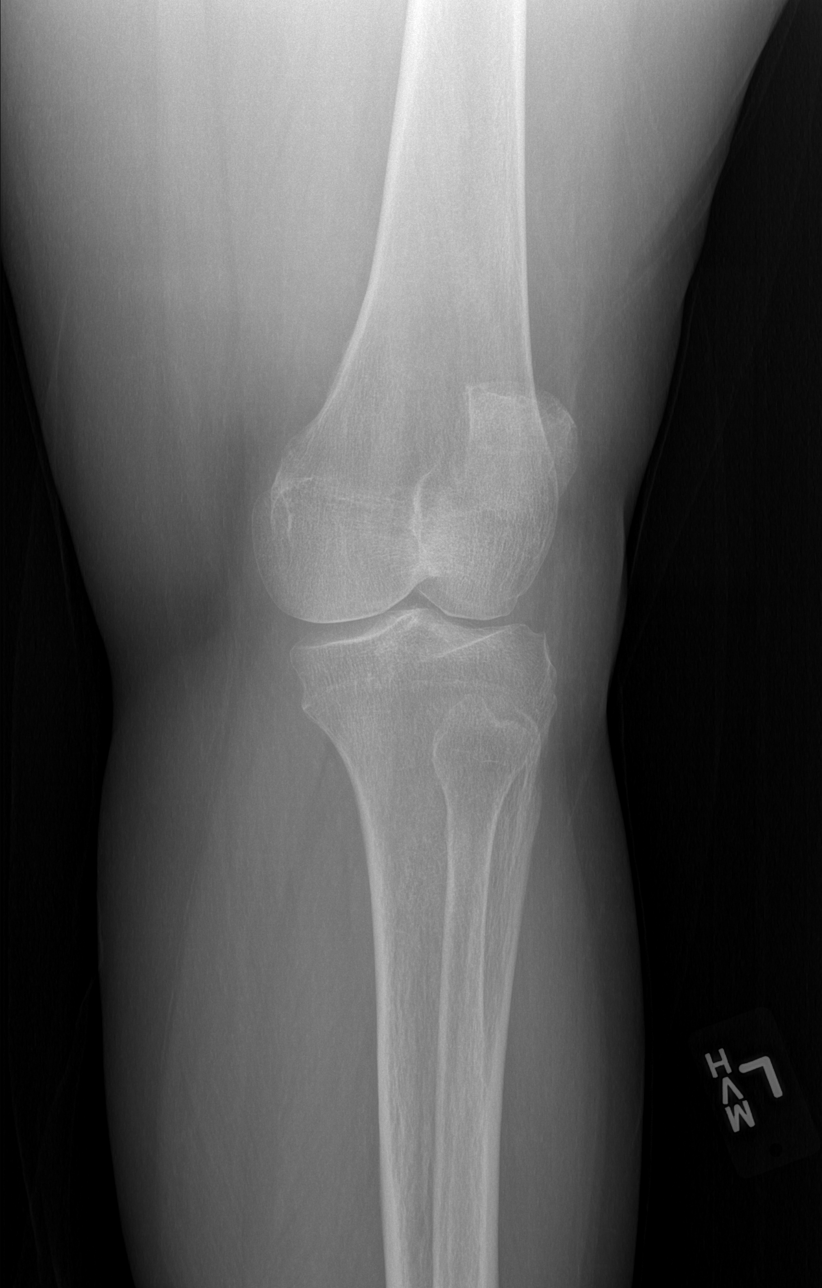

[t knee lat left]
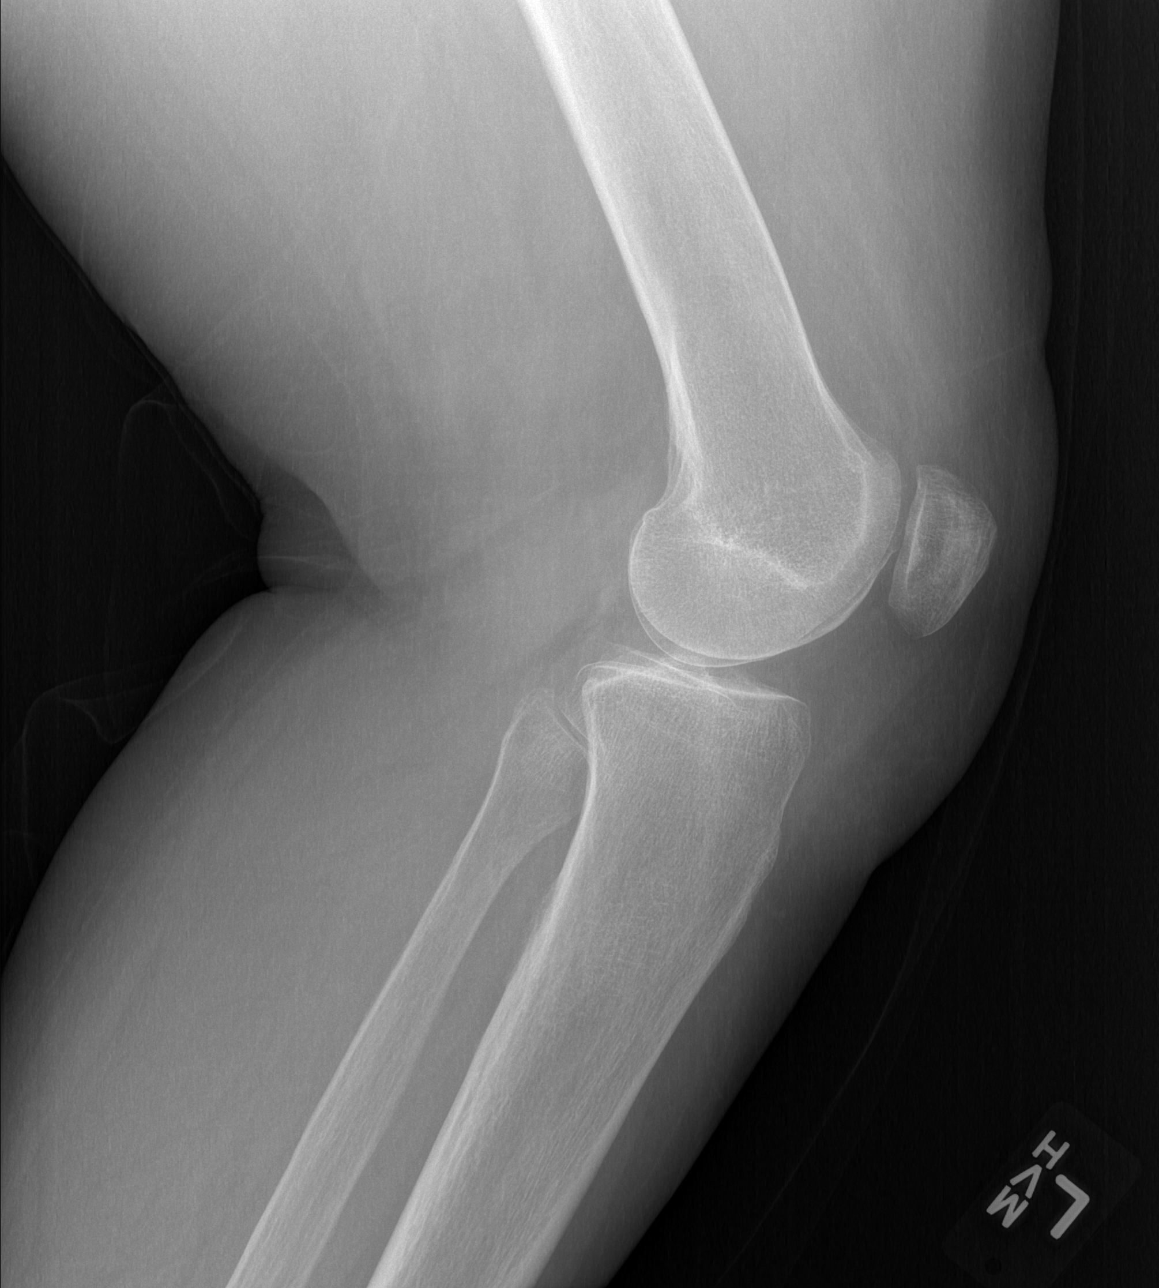

[4 of 4 positions shown; findings below may reference images not displayed]

FINDINGS: No evidence of fracture, dislocation, or joint effusion. No evidence
of arthropathy or other focal bone abnormality. Soft tissues are
unremarkable.
IMPRESSION: Negative.

## 2019-05-01 ENCOUNTER — Other Ambulatory Visit: Payer: Self-pay | Admitting: Family Medicine

## 2019-05-01 DIAGNOSIS — Z9289 Personal history of other medical treatment: Secondary | ICD-10-CM

## 2019-05-06 NOTE — Patient Instructions (Addendum)
DUE TO COVID-19 ONLY ONE VISITOR IS ALLOWED TO COME WITH YOU AND STAY IN THE WAITING ROOM ONLY DURING PRE OP AND PROCEDURE DAY OF SURGERY. THE 1 VISITOR MAY VISIT WITH YOU AFTER SURGERY IN YOUR PRIVATE ROOM DURING VISITING HOURS ONLY!  YOU NEED TO HAVE A COVID 19 TEST ON__Thursday 1/7/2021_____ @___9 :30AM____, THIS TEST MUST BE DONE BEFORE SURGERY, COME  [place], [place] , [address].  ([place]) ONCE YOUR COVID TEST IS COMPLETED, PLEASE BEGIN THE QUARANTINE INSTRUCTIONS AS OUTLINED IN YOUR HANDOUT.                 [place]    Your procedure is scheduled on: 05/13/2019   Report to [facility] Main  Entrance   Report to admitting at 11:30AM     Call this number if you have problems the morning of surgery [phone]    Remember: NO SOLID FOOD AFTER MIDNIGHT THE NIGHT PRIOR TO SURGERY. YOU MAY DRINK CLEAR LIQUIDS.   STOP CLEAR LIQUIDS AT ___11:00AM______ AND THEN DRINK THE G2 low sugar Gatorade PRE-SURGERY DRINK. NOTHING BY MOUTH AFTER THE Gatorade DRINK!     CLEAR LIQUID DIET   Foods Allowed                                                                     Foods Excluded  Coffee and tea, regular and decaf                             liquids that you cannot  Plain Jell-O any favor except red or purple                                           see through such as: Fruit ices (not with fruit pulp)                                     milk, soups, orange juice  Iced Popsicles                                    All solid food Carbonated beverages, regular and diet                                    Cranberry, grape and apple juices Sports drinks like Gatorade Lightly seasoned clear broth or consume(fat free) Sugar, honey syrup  Sample Menu Breakfast                                Lunch                                     Supper Cranberry juice  Beef broth                            Chicken broth Jell-O                                      Grape juice                           Apple juice Coffee or tea                        Jell-O                                      Popsicle                                                Coffee or tea                        Coffee or tea  _____________________________________________________________________    BRUSH YOUR TEETH MORNING OF SURGERY AND RINSE YOUR MOUTH OUT, NO CHEWING GUM CANDY OR MINTS.     Take these medicines the morning of surgery with A SIP OF WATER: gabapentin, omeprazole, hydroxyzine if needed, meclizine if needed    How to Manage Your Diabetes Before and After Surgery  Why is it important to control my blood sugar before and after surgery? . Improving blood sugar levels before and after surgery helps healing and can limit problems. . A way of improving blood sugar control is eating a healthy diet by: o  Eating less sugar and carbohydrates o  Increasing activity/exercise o  Talking with your doctor about reaching your blood sugar goals . High blood sugars (greater than 180 mg/dL) can raise your risk of infections and slow your recovery, so you will need to focus on controlling your diabetes during the weeks before surgery. . Make sure that the doctor who takes care of your diabetes knows about your planned surgery including the date and location.  How do I manage my blood sugar before surgery? . Check your blood sugar at least 4 times a day, starting 2 days before surgery, to make sure that the level is not too high or low. o Check your blood sugar the morning of your surgery when you wake up and every 2 hours until you get to the Short Stay unit. . If your blood sugar is less than 70 mg/dL, you will need to treat for low blood sugar: o Do not take insulin. o Treat a low blood sugar (less than 70 mg/dL) with  cup of clear juice (cranberry or apple), 4 glucose tablets, OR glucose gel. o Recheck blood sugar in 15 minutes after treatment (to make  sure it is greater than 70 mg/dL). If your blood sugar is not greater than 70 mg/dL on recheck, call [phone] for further instructions. . Report your blood sugar to the short stay nurse when you get to Short Stay.  . If you are admitted to the hospital after surgery: o Your blood sugar will  be checked by the staff and you will probably be given insulin after surgery (instead of oral diabetes medicines) to make sure you have good blood sugar levels. o The goal for blood sugar control after surgery is 80-180 mg/dL.   WHAT DO I DO ABOUT MY DIABETES MEDICATION?   . THE DAY BEFORE SURGERY, take [place] as usual       . THE MORNING OF SURGERY, DO NOT TAKE ANY DIABETIC MEDICATIONS   Patient Signature:  Date:   Nurse Signature:  Date:   Reviewed and Endorsed by [facility] Patient Education Committee, August 2015                                 You may not have any metal on your body including hair pins and              piercings  Do not wear jewelry, make-up, lotions, powders or perfumes, deodorant             Do not wear nail polish on your fingernails.  Do not shave  48 hours prior to surgery.              Men may shave face and neck.   Do not bring valuables to the hospital. [place].  Contacts, dentures or bridgework may not be worn into surgery.  YOU MAY BRING A SMALL OVERNIGHT BAG     Patients discharged the day of surgery will not be allowed to drive home. IF YOU ARE HAVING SURGERY AND GOING HOME THE SAME DAY, YOU MUST HAVE AN ADULT TO DRIVE YOU HOME AND BE WITH YOU FOR 24 HOURS. YOU MAY GO HOME BY TAXI OR UBER OR ORTHERWISE, BUT AN ADULT MUST ACCOMPANY YOU HOME AND STAY WITH YOU FOR 24 HOURS.  Name and phone number of your driver:  Special Instructions: N/A              Please read over the following fact sheets you were given: _____________________________________________________________________             [facility] - Preparing for Surgery  Before surgery, you can play an important role.  Because skin is not sterile, your skin needs to be as free of germs as possible.  You can reduce the number of germs on your skin by washing with CHG (chlorahexidine gluconate) soap before surgery.  CHG is an antiseptic cleaner which kills germs and bonds with the skin to continue killing germs even after washing. Please DO NOT use if you have an allergy to CHG or antibacterial soaps.  If your skin becomes reddened/irritated stop using the CHG and inform your nurse when you arrive at Short Stay. Do not shave (including legs and underarms) for at least 48 hours prior to the first CHG shower.  You may shave your face/neck. Please follow these instructions carefully:  1.  Shower with CHG Soap the night before surgery and the  morning of Surgery.  2.  If you choose to wash your hair, wash your hair first as usual with your  normal  shampoo.  3.  After you shampoo, rinse your hair and body thoroughly to remove the  shampoo.  4.  Use CHG as you would any other liquid soap.  You can apply chg directly  to the skin and wash                       Gently with a scrungie or clean washcloth.  5.  Apply the CHG Soap to your body ONLY FROM THE NECK DOWN.   Do not use on face/ open                           Wound or open sores. Avoid contact with eyes, ears mouth and genitals (private parts).                       Wash face,  Genitals (private parts) with your normal soap.             6.  Wash thoroughly, paying special attention to the area where your surgery  will be performed.  7.  Thoroughly rinse your body with warm water from the neck down.  8.  DO NOT shower/wash with your normal soap after using and rinsing off  the CHG Soap.                9.  Pat yourself dry with a clean towel.            10.  Wear clean pajamas.            11.  Place clean sheets on your bed the night of your first shower and do  not  sleep with pets. Day of Surgery : Do not apply any lotions/deodorants the morning of surgery.  Please wear clean clothes to the hospital/surgery center.  FAILURE TO FOLLOW THESE INSTRUCTIONS MAY RESULT IN THE CANCELLATION OF YOUR SURGERY PATIENT SIGNATURE_________________________________  NURSE SIGNATURE__________________________________  ________________________________________________________________________    Adam Phenix  An incentive spirometer is a tool that can help keep your lungs clear and active. This tool measures how well you are filling your lungs with each breath. Taking long deep breaths may help reverse or decrease the chance of developing breathing (pulmonary) problems (especially infection) following:  A long period of time when you are unable to move or be active. BEFORE THE PROCEDURE   If the spirometer includes an indicator to show your best effort, your nurse or respiratory therapist will set it to a desired goal.  If possible, sit up straight or lean slightly forward. Try not to slouch.  Hold the incentive spirometer in an upright position. INSTRUCTIONS FOR USE  1. Sit on the edge of your bed if possible, or sit up as far as you can in bed or on a chair. 2. Hold the incentive spirometer in an upright position. 3. Breathe out normally. 4. Place the mouthpiece in your mouth and seal your lips tightly around it. 5. Breathe in slowly and as deeply as possible, raising the piston or the ball toward the top of the column. 6. Hold your breath for 3-5 seconds or for as long as possible. Allow the piston or ball to fall to the bottom of the column. 7. Remove the mouthpiece from your mouth and breathe out normally. 8. Rest for a few seconds and repeat Steps 1 through 7 at least 10 times every 1-2 hours when you are awake. Take your time and take a few normal breaths between deep breaths. 9. The spirometer may include an indicator to  show your best effort.  Use the indicator as a goal to work toward during each repetition. 10. After each set of 10 deep breaths, practice coughing to be sure your lungs are clear. If you have an incision (the cut made at the time of surgery), support your incision when coughing by placing a pillow or rolled up towels firmly against it. Once you are able to get out of bed, walk around indoors and cough well. You may stop using the incentive spirometer when instructed by your caregiver.  RISKS AND COMPLICATIONS  Take your time so you do not get dizzy or light-headed.  If you are in pain, you may need to take or ask for pain medication before doing incentive spirometry. It is harder to take a deep breath if you are having pain. AFTER USE  Rest and breathe slowly and easily.  It can be helpful to keep track of a log of your progress. Your caregiver can provide you with a simple table to help with this. If you are using the spirometer at home, follow these instructions: [place] IF:   You are having difficultly using the spirometer.  You have trouble using the spirometer as often as instructed.  Your pain medication is not giving enough relief while using the spirometer.  You develop fever of 100.5 F (38.1 C) or higher. SEEK IMMEDIATE MEDICAL CARE IF:   You cough up bloody sputum that had not been present before.  You develop fever of 102 F (38.9 C) or greater.  You develop worsening pain at or near the incision site. MAKE SURE YOU:   Understand these instructions.  Will watch your condition.  Will get help right away if you are not doing well or get worse. Document Released: 08/29/2006 Document Revised: 07/11/2011 Document Reviewed: 10/30/2006 [facility] Patient Information 2014 [place], [place].   ________________________________________________________________________

## 2019-05-07 ENCOUNTER — Encounter (HOSPITAL_COMMUNITY)
Admission: RE | Admit: 2019-05-07 | Discharge: 2019-05-07 | Disposition: A | Discharge status: Home / Self Care | Payer: Medicare HMO | Source: Ambulatory Visit | Attending: Orthopedic Surgery | Admitting: Orthopedic Surgery

## 2019-05-07 ENCOUNTER — Other Ambulatory Visit: Payer: Self-pay

## 2019-05-07 ENCOUNTER — Encounter (HOSPITAL_COMMUNITY): Payer: Self-pay

## 2019-05-07 DIAGNOSIS — Z01812 Encounter for preprocedural laboratory examination: Secondary | ICD-10-CM | POA: Diagnosis not present

## 2019-05-07 NOTE — Progress Notes (Signed)
PCP - [name], MD Cardiologist - n/a  Chest x-ray - 02/28/2019 epic  EKG - 02/28/2019 epic  Stress Test -  ECHO -  Cardiac Cath -   Sleep Study -  CPAP -   Fasting Blood Sugar - 89-150 Checks Blood Sugar __1-2___ times a day  Blood Thinner Instructions: Aspirin Instructions: hold 5-7 days before surgery  Last Dose: Sunday 05/05/2019  Anesthesia review:    Patient denies shortness of breath, fever, cough and chest pain at PAT appointment   Patient verbalized understanding of instructions that were given to them at the PAT appointment. Patient was also instructed that they will need to review over the PAT instructions again at home before surgery.

## 2019-05-08 ENCOUNTER — Encounter (HOSPITAL_COMMUNITY)
Admission: RE | Admit: 2019-05-08 | Discharge: 2019-05-08 | Disposition: A | Discharge status: Home / Self Care | Payer: Medicare HMO | Source: Ambulatory Visit | Attending: Orthopedic Surgery | Admitting: Orthopedic Surgery

## 2019-05-08 DIAGNOSIS — Z01812 Encounter for preprocedural laboratory examination: Secondary | ICD-10-CM | POA: Insufficient documentation

## 2019-05-08 LAB — CBC WITH DIFFERENTIAL/PLATELET
Abs Immature Granulocytes: 0.01 10*3/uL (ref 0.00–0.07)
Basophils Absolute: 0 10*3/uL (ref 0.0–0.1)
Basophils Relative: 1 %
Eosinophils Absolute: 0.1 10*3/uL (ref 0.0–0.5)
Eosinophils Relative: 3 %
HCT: 40.4 % (ref 36.0–46.0)
Hemoglobin: 12.6 g/dL (ref 12.0–15.0)
Immature Granulocytes: 0 %
Lymphocytes Relative: 36 %
Lymphs Abs: 1.5 10*3/uL (ref 0.7–4.0)
MCH: 30.4 pg (ref 26.0–34.0)
MCHC: 31.2 g/dL (ref 30.0–36.0)
MCV: 97.3 fL (ref 80.0–100.0)
Monocytes Absolute: 0.3 10*3/uL (ref 0.1–1.0)
Monocytes Relative: 7 %
Neutro Abs: 2.2 10*3/uL (ref 1.7–7.7)
Neutrophils Relative %: 53 %
Platelets: 238 10*3/uL (ref 150–400)
RBC: 4.15 MIL/uL (ref 3.87–5.11)
RDW: 13.5 % (ref 11.5–15.5)
WBC: 4.2 10*3/uL (ref 4.0–10.5)
nRBC: 0 % (ref 0.0–0.2)

## 2019-05-08 LAB — URINALYSIS, ROUTINE W REFLEX MICROSCOPIC
Bilirubin Urine: NEGATIVE
Glucose, UA: NEGATIVE mg/dL
Hgb urine dipstick: NEGATIVE
Ketones, ur: 5 mg/dL — AB
Leukocytes,Ua: NEGATIVE
Nitrite: NEGATIVE
Protein, ur: NEGATIVE mg/dL
Specific Gravity, Urine: 1.026 (ref 1.005–1.030)
pH: 5 (ref 5.0–8.0)

## 2019-05-08 LAB — SURGICAL PCR SCREEN
MRSA, PCR: NEGATIVE
Staphylococcus aureus: NEGATIVE

## 2019-05-08 LAB — HEMOGLOBIN A1C
Hgb A1c MFr Bld: 6.3 % — ABNORMAL HIGH (ref 4.8–5.6)
Mean Plasma Glucose: 134.11 mg/dL

## 2019-05-08 LAB — PROTIME-INR
INR: 1 (ref 0.8–1.2)
Prothrombin Time: 12.8 seconds (ref 11.4–15.2)

## 2019-05-08 LAB — NO BLOOD PRODUCTS

## 2019-05-08 LAB — BASIC METABOLIC PANEL
Anion gap: 9 (ref 5–15)
BUN: 11 mg/dL (ref 8–23)
CO2: 30 mmol/L (ref 22–32)
Calcium: 9.2 mg/dL (ref 8.9–10.3)
Chloride: 102 mmol/L (ref 98–111)
Creatinine, Ser: 0.91 mg/dL (ref 0.44–1.00)
GFR calc Af Amer: >60 mL/min (ref >60)
GFR calc non Af Amer: >60 mL/min (ref >60)
Glucose, Bld: 153 mg/dL — ABNORMAL HIGH (ref 70–99)
Potassium: 4.4 mmol/L (ref 3.5–5.1)
Sodium: 141 mmol/L (ref 135–145)

## 2019-05-08 LAB — GLUCOSE, CAPILLARY: Glucose-Capillary: 142 mg/dL — ABNORMAL HIGH (ref 70–99)

## 2019-05-08 LAB — APTT: aPTT: 29 seconds (ref 24–36)

## 2019-05-09 ENCOUNTER — Other Ambulatory Visit (HOSPITAL_COMMUNITY)
Admission: RE | Admit: 2019-05-09 | Discharge: 2019-05-09 | Disposition: A | Discharge status: Home / Self Care | Payer: Medicare HMO | Source: Ambulatory Visit | Attending: Orthopedic Surgery | Admitting: Orthopedic Surgery

## 2019-05-09 DIAGNOSIS — Z01812 Encounter for preprocedural laboratory examination: Secondary | ICD-10-CM | POA: Diagnosis present

## 2019-05-09 DIAGNOSIS — Z20822 Contact with and (suspected) exposure to covid-19: Secondary | ICD-10-CM | POA: Insufficient documentation

## 2019-05-10 DIAGNOSIS — M1712 Unilateral primary osteoarthritis, left knee: Secondary | ICD-10-CM | POA: Diagnosis present

## 2019-05-10 NOTE — H&P (Signed)
TOTAL KNEE ADMISSION H&P  Patient is being admitted for left total knee arthroplasty.  Subjective:  Chief Complaint:left knee pain.  HPI: Kayla Mayo, 64 y.o. female, has a history of pain and functional disability in the left knee due to arthritis and has failed non-surgical conservative treatments for greater than 12 weeks to includeNSAID's and/or analgesics, corticosteriod injections, viscosupplementation injections, use of assistive devices, weight reduction as appropriate and activity modification.  Onset of symptoms was gradual, starting 10 years ago with gradually worsening course since that time. The patient noted prior procedures on the knee to include  arthroscopy on the left knee(s).  Patient currently rates pain in the left knee(s) at 10 out of 10 with activity. Patient has night pain, worsening of pain with activity and weight bearing, pain that interferes with activities of daily living, pain with passive range of motion, crepitus and joint swelling.  Patient has evidence of periarticular osteophytes and joint space narrowing by imaging studies.  There is no active infection.  Patient Active Problem List   Diagnosis Date Noted  . Degenerative arthritis of left knee 05/10/2019  . S/P total knee replacement, right 03/04/2019  . PONV (postoperative nausea and vomiting) 03/02/2019  . Osteoarthritis of right knee 03/01/2019  . Chronic pain syndrome 12/30/2015  . Relationship problem between partners 12/30/2015  . Anemia of decreased vitamin B12 absorption 10/12/2015  . Iron deficiency anemia 11/28/2013  . Major depressive disorder, recurrent ([place]) 10/06/2012  . GAD (generalized anxiety disorder) 08/22/2012  . Osteopenia   . Vitamin D deficiency 03/23/2011  . Insomnia 03/23/2011  . Anemia 03/17/2011  . Proteinuria 10/19/2010  . Back pain 10/19/2010  . Diabetes mellitus with peripheral autonomic neuropathy ([place]) 07/27/2010  . Weight gain 07/27/2010  . SHOULDER PAIN, RIGHT  05/12/2010  . GASTROENTERITIS WITHOUT DEHYDRATION 09/17/2009  . DRY SKIN 06/03/2009  . HYPERLIPIDEMIA 05/07/2009  . Anemia due to other cause 05/07/2009  . Major depressive disorder, recurrent episode, moderate ([place]) 05/07/2009  . ESSENTIAL HYPERTENSION, BENIGN 05/07/2009  . ARTHRITIS, KNEES, BILATERAL 05/07/2009  . DM 01/21/2009  . DYSPEPSIA&OTHER [facility] DISORDERS FUNCTION STOMACH 01/21/2009  . CONSTIPATION 01/21/2009  . DYSPHAGIA UNSPECIFIED 01/21/2009   Past Medical History:  Diagnosis Date  . Anal fissure   . Anemia 03/17/2011  . Anxiety   . Arthritis   . Breast cancer ([place])   . Bursitis of hip   . Chronic headaches   . Colon polyps   . Depression   . Diabetes mellitus    type 2   . Esophageal spasm   . Fibromyalgia   . Gallstones   . GERD (gastroesophageal reflux disease)   . Hyperlipidemia   . Hypertension   . Hypothyroidism   . IBS (irritable bowel syndrome)   . Neuropathy   . Obesity   . Osteopenia 07/2011   t score -1.5 FRAX 2.1%/0%  . PONV (postoperative nausea and vomiting)    none with the most recent surgeries   . Status post cardiac catheterization 2007   [place], [place] unsure which hospital it was ; reports it was ruled panic attack as the cause   . Vertigo    Occurs 6 times a month;  reports last occurrence was 04/2019    Past Surgical History:  Procedure Laterality Date  . APPENDECTOMY    . bilareral knee scopes    . CARPAL TUNNEL RELEASE    . CHOLECYSTECTOMY    . GASTRIC BYPASS    . left shouler surgery    .  lower back surgery    . TOTAL KNEE ARTHROPLASTY Right 03/04/2019   Procedure: [place] TOTAL KNEE ARTHROPLASTY;  Surgeon: Frederik Pear, MD;  Location: [facility] ORS;  Service: Orthopedics;  Laterality: Right;  . TUBAL LIGATION      No current facility-administered medications for this encounter.   Current Outpatient Medications  Medication Sig Dispense Refill Last Dose  . aspirin EC 81 MG tablet Take 1 tablet (81 mg total) by mouth 2 (two) times  daily. (Patient taking differently: Take 81 mg by mouth daily. ) 60 tablet 0   . Cholecalciferol (VITAMIN D3) 50 MCG (2000 UT) TABS Take 2,000 Units by mouth daily.     . cyanocobalamin (,VITAMIN B-12,) 1000 MCG/ML injection Inject 1,000 mcg into the muscle every 30 (thirty) days.     . Cyanocobalamin 1000 MCG/ML KIT Inject 1,000 mcg as directed every 30 (thirty) days. 1 kit 5   . cyclobenzaprine (FLEXERIL) 10 MG tablet Take 1 tablet (10 mg total) by mouth 3 (three) times daily as needed for muscle spasms. 30 tablet 0   . diclofenac sodium (VOLTAREN) 1 % GEL Apply 1 application topically 4 (four) times daily as needed (pain.).      Marland Kitchen dicyclomine (BENTYL) 20 MG tablet Take 20 mg by mouth 3 (three) times daily as needed for spasms (abdominal pain).     . fluticasone (FLONASE) 50 MCG/ACT nasal spray Place 2 sprays into both nostrils 2 (two) times daily.     Marland Kitchen gabapentin (NEURONTIN) 600 MG tablet Take 600 mg by mouth 5 (five) times daily.      Marland Kitchen HYDROcodone-acetaminophen (NORCO) 10-325 MG tablet Take 1 tablet by mouth every 4 (four) hours as needed (pain.).   0   . hydrocortisone (PROCTOSOL HC) 2.5 % rectal cream Place 1 application rectally as needed. (Patient taking differently: Place 1 application rectally 2 (two) times daily as needed (discomfort/hemorrhoids). ) 30 g 0   . hydrOXYzine (ATARAX/VISTARIL) 25 MG tablet Take 25 mg by mouth every 8 (eight) hours as needed for anxiety.      Marland Kitchen levothyroxine (SYNTHROID, LEVOTHROID) 25 MCG tablet Take 25 mcg by mouth at bedtime.      . Linaclotide (LINZESS) 290 MCG CAPS capsule Take 290 mcg by mouth daily.      . meclizine (ANTIVERT) 25 MG tablet Take 25 mg by mouth 3 (three) times daily as needed for dizziness.      . naloxone (NARCAN) 0.4 MG/ML injection Inject 0.4 mg into the skin as needed.     Marland Kitchen omeprazole (PRILOSEC) 40 MG capsule Take 40 mg by mouth daily.     . polyethylene glycol powder (GLYCOLAX/MIRALAX) powder Take 17 g by mouth 2 (two) times daily.  (Patient taking differently: Take 68 g by mouth 2 (two) times daily. ) 255 g 0   . potassium chloride SA (KLOR-CON) 20 MEQ tablet Take 1 tablet (20 mEq total) by mouth daily. 30 tablet 2   . rosuvastatin (CRESTOR) 10 MG tablet Take 10 mg by mouth at bedtime.      . Semaglutide (OZEMPIC, 0.25 OR 0.5 MG/DOSE, [place]) Inject 0.5 mg into the skin every Friday.     . senna (SENOKOT) 8.6 MG tablet Take 4 tablets by mouth 2 (two) times daily.     Marland Kitchen ACCU-CHEK FASTCLIX LANCETS MISC Use daily as directed for Dx: 250.00     . Syringe, Disposable, (2-3CC SYRINGE) 3 ML MISC 25 Syringes by Does not apply route every 30 (thirty) days. 1 each 0   .  SYRINGE-NEEDLE, DISP, 3 ML (LUER LOCK SAFETY SYRINGES) 23G X 1" 3 ML MISC 1 mL by Does not apply route every 30 (thirty) days. For B12 home administration 12 each 1    Facility-Administered Medications Ordered in Other Encounters  Medication Dose Route Frequency Provider Last Rate Last Admin  . cyanocobalamin ((VITAMIN B-12)) injection 1,000 mcg  1,000 mcg Intramuscular Q28 days Marcy Panning, MD       Allergies  Allergen Reactions  . Morphine And Related Itching  . Blood-Group Specific Substance     Refuses Blood  . Codeine Nausea And Vomiting and Other (See Comments)    hallucinations  . Enalapril     cough  . Food     Sesame oil, chicken, corn, lactose, peanut, wheat  . Oxycodone Nausea And Vomiting and Other (See Comments)    hallucinations  . Peanut-Containing Drug Products   . Simvastatin     Muscle pain    Social History   Tobacco Use  . Smoking status: Never Smoker  . Smokeless tobacco: Never Used  Substance Use Topics  . Alcohol use: No    Family History  Problem Relation Age of Onset  . Diabetes Mother   . Stroke Mother   . Heart disease Mother   . Hypertension Mother   . Hyperlipidemia Mother   . Anxiety disorder Mother   . Diabetes Brother   . Alcohol abuse Brother   . Drug abuse Brother   . Alcohol abuse Father   . Cancer Father         Stomach  . Diabetes Maternal Aunt   . Diabetes Maternal Uncle   . Alcohol abuse Maternal Uncle   . Diabetes Maternal Grandmother   . Dementia Maternal Grandmother   . Depression Maternal Grandmother   . Alcohol abuse Paternal Uncle   . Cancer Paternal Uncle        Throat  . Alcohol abuse Maternal Grandfather   . Alcohol abuse Paternal Grandfather   . Alcohol abuse Maternal Uncle   . Alcohol abuse Paternal Uncle   . Alcohol abuse Paternal Uncle   . Cancer Paternal Aunt        Leukemia     Review of Systems  Constitutional: Positive for fatigue.  HENT: Positive for sinus pain.   Eyes: Negative.   Respiratory: Negative.   Cardiovascular: Negative.   Gastrointestinal: Positive for constipation.  Endocrine: Negative.   Genitourinary: Negative.   Musculoskeletal: Positive for arthralgias and myalgias.  Neurological: Negative.   Hematological: Negative.   Psychiatric/Behavioral: The patient is nervous/anxious.     Objective:  Physical Exam  Constitutional: She is oriented to person, place, and time. She appears well-developed and well-nourished.  HENT:  Head: Normocephalic and atraumatic.  Eyes: Pupils are equal, round, and reactive to light.  Cardiovascular: Intact distal pulses.  Respiratory: Effort normal.  Musculoskeletal:        General: Tenderness present.     Cervical back: Normal range of motion and neck supple.     Comments: left knee valgus deformities of 8 tender along the lateral joint line of the right knee crepitus as you take her through range of motion trace effusion collateral ligaments are stable but valgus stress exacerbates her pain.  Range of motion 0/130  with pain on extremes.    Neurological: She is alert and oriented to person, place, and time.  Skin: Skin is warm and dry.  Psychiatric: She has a normal mood and affect. Her behavior is normal. Judgment  and thought content normal.    Vital signs in last 24 hours:    Labs:   Estimated  body mass index is 30.45 kg/m as calculated from the following:   Height as of 05/08/19: _0  (1.651 m).   Weight as of 05/08/19: 83 kg.   Imaging Review Plain radiographs demonstrate  on the contralateral left knee there is a valgus angulation with some flattening of the distal femur and loss of articular cartilage height.      Assessment/Plan:  End stage arthritis, left knee   The patient history, physical examination, clinical judgment of the provider and imaging studies are consistent with end stage degenerative joint disease of the left knee(s) and total knee arthroplasty is deemed medically necessary. The treatment options including medical management, injection therapy arthroscopy and arthroplasty were discussed at length. The risks and benefits of total knee arthroplasty were presented and reviewed. The risks due to aseptic loosening, infection, stiffness, patella tracking problems, thromboembolic complications and other imponderables were discussed. The patient acknowledged the explanation, agreed to proceed with the plan and consent was signed. Patient is being admitted for inpatient treatment for surgery, pain control, PT, OT, prophylactic antibiotics, VTE prophylaxis, progressive ambulation and ADL's and discharge planning. The patient is planning to be discharged home with home health services     Patient's anticipated LOS is less than 2 midnights, meeting these requirements: - Younger than 31 - Lives within 1 hour of care - Has a competent adult at home to recover with post-op recover - NO history of  - Chronic pain requiring opiods  - Diabetes  - Coronary Artery Disease  - Heart failure  - Heart attack  - Stroke  - DVT/VTE  - Cardiac arrhythmia  - Respiratory Failure/COPD  - Renal failure  - Anemia  - Advanced Liver disease

## 2019-05-11 LAB — NOVEL CORONAVIRUS, NAA (HOSP ORDER, SEND-OUT TO REF LAB; TAT 18-24 HRS): SARS-CoV-2, NAA: NOT DETECTED

## 2019-05-12 MED ORDER — BUPIVACAINE LIPOSOME 1.3 % IJ SUSP
20.0000 mL | Freq: Once | INTRAMUSCULAR | Status: DC
  Filled 2019-05-12: qty 20

## 2019-05-12 MED ORDER — TRANEXAMIC ACID 1000 MG/10ML IV SOLN
2000.0000 mg | INTRAVENOUS | Status: DC
  Filled 2019-05-12: qty 20

## 2019-05-13 ENCOUNTER — Observation Stay (HOSPITAL_COMMUNITY)
Admission: RE | Admit: 2019-05-13 | Discharge: 2019-05-15 | Disposition: A | Discharge status: Home / Self Care | Payer: Medicare HMO | Attending: Orthopedic Surgery | Admitting: Orthopedic Surgery

## 2019-05-13 ENCOUNTER — Ambulatory Visit (HOSPITAL_COMMUNITY): Payer: Medicare HMO | Admitting: Anesthesiology

## 2019-05-13 ENCOUNTER — Ambulatory Visit (HOSPITAL_COMMUNITY): Payer: Medicare HMO | Admitting: Physician Assistant

## 2019-05-13 ENCOUNTER — Encounter (HOSPITAL_COMMUNITY)
Admission: RE | Disposition: A | Discharge status: Home / Self Care | Payer: Self-pay | Source: Home / Self Care | Attending: Orthopedic Surgery

## 2019-05-13 ENCOUNTER — Encounter (HOSPITAL_COMMUNITY): Payer: Self-pay | Admitting: Orthopedic Surgery

## 2019-05-13 DIAGNOSIS — F411 Generalized anxiety disorder: Secondary | ICD-10-CM | POA: Diagnosis not present

## 2019-05-13 DIAGNOSIS — Z79899 Other long term (current) drug therapy: Secondary | ICD-10-CM | POA: Insufficient documentation

## 2019-05-13 DIAGNOSIS — E669 Obesity, unspecified: Secondary | ICD-10-CM | POA: Insufficient documentation

## 2019-05-13 DIAGNOSIS — E039 Hypothyroidism, unspecified: Secondary | ICD-10-CM | POA: Diagnosis not present

## 2019-05-13 DIAGNOSIS — E1143 Type 2 diabetes mellitus with diabetic autonomic (poly)neuropathy: Secondary | ICD-10-CM | POA: Diagnosis not present

## 2019-05-13 DIAGNOSIS — M797 Fibromyalgia: Secondary | ICD-10-CM | POA: Insufficient documentation

## 2019-05-13 DIAGNOSIS — I110 Hypertensive heart disease with heart failure: Secondary | ICD-10-CM | POA: Diagnosis not present

## 2019-05-13 DIAGNOSIS — M8580 Other specified disorders of bone density and structure, unspecified site: Secondary | ICD-10-CM | POA: Diagnosis not present

## 2019-05-13 DIAGNOSIS — M1712 Unilateral primary osteoarthritis, left knee: Principal | ICD-10-CM | POA: Insufficient documentation

## 2019-05-13 DIAGNOSIS — E785 Hyperlipidemia, unspecified: Secondary | ICD-10-CM | POA: Insufficient documentation

## 2019-05-13 DIAGNOSIS — Z79890 Hormone replacement therapy (postmenopausal): Secondary | ICD-10-CM | POA: Insufficient documentation

## 2019-05-13 DIAGNOSIS — Z7984 Long term (current) use of oral hypoglycemic drugs: Secondary | ICD-10-CM | POA: Insufficient documentation

## 2019-05-13 DIAGNOSIS — Z6830 Body mass index (BMI) 30.0-30.9, adult: Secondary | ICD-10-CM | POA: Diagnosis not present

## 2019-05-13 DIAGNOSIS — Z7982 Long term (current) use of aspirin: Secondary | ICD-10-CM | POA: Diagnosis not present

## 2019-05-13 DIAGNOSIS — Z96652 Presence of left artificial knee joint: Secondary | ICD-10-CM

## 2019-05-13 DIAGNOSIS — Z96651 Presence of right artificial knee joint: Secondary | ICD-10-CM

## 2019-05-13 DIAGNOSIS — K219 Gastro-esophageal reflux disease without esophagitis: Secondary | ICD-10-CM | POA: Diagnosis not present

## 2019-05-13 HISTORY — PX: TOTAL KNEE ARTHROPLASTY: SHX125

## 2019-05-13 LAB — GLUCOSE, CAPILLARY
Glucose-Capillary: 116 mg/dL — ABNORMAL HIGH (ref 70–99)
Glucose-Capillary: 151 mg/dL — ABNORMAL HIGH (ref 70–99)

## 2019-05-13 SURGERY — ARTHROPLASTY, KNEE, TOTAL
Anesthesia: Spinal | Site: Knee | Laterality: Left

## 2019-05-13 MED ORDER — ONDANSETRON HCL 4 MG/2ML IJ SOLN: 4.0000 mg | Freq: Four times a day (QID) | INTRAMUSCULAR | Status: DC | PRN

## 2019-05-13 MED ORDER — FENTANYL CITRATE (PF) 100 MCG/2ML IJ SOLN
50.0000 ug | INTRAMUSCULAR | Status: DC
  Administered 2019-05-13: 50 ug via INTRAVENOUS
  Filled 2019-05-13: qty 2

## 2019-05-13 MED ORDER — INSULIN ASPART 100 UNIT/ML ~~LOC~~ SOLN
0.0000 [IU] | Freq: Three times a day (TID) | SUBCUTANEOUS | Status: DC
  Administered 2019-05-14: 5 [IU] via SUBCUTANEOUS
  Administered 2019-05-14 – 2019-05-15 (×3): 3 [IU] via SUBCUTANEOUS

## 2019-05-13 MED ORDER — LINACLOTIDE 145 MCG PO CAPS
290.0000 ug | ORAL_CAPSULE | Freq: Every day | ORAL | Status: DC
  Administered 2019-05-13 – 2019-05-15 (×3): 290 ug via ORAL
  Filled 2019-05-13 (×3): qty 2

## 2019-05-13 MED ORDER — MIDAZOLAM HCL 2 MG/2ML IJ SOLN
1.0000 mg | INTRAMUSCULAR | Status: DC
  Administered 2019-05-13: 2 mg via INTRAVENOUS
  Filled 2019-05-13: qty 2

## 2019-05-13 MED ORDER — PHENYLEPHRINE 40 MCG/ML (10ML) SYRINGE FOR IV PUSH (FOR BLOOD PRESSURE SUPPORT)
PREFILLED_SYRINGE | INTRAVENOUS | Status: AC
  Filled 2019-05-13: qty 10

## 2019-05-13 MED ORDER — MIDAZOLAM HCL 5 MG/5ML IJ SOLN
INTRAMUSCULAR | Status: DC | PRN
  Administered 2019-05-13: 2 mg via INTRAVENOUS

## 2019-05-13 MED ORDER — CYCLOBENZAPRINE HCL 10 MG PO TABS: 10.0000 mg | ORAL_TABLET | Freq: Three times a day (TID) | ORAL | 0 refills | Status: DC | PRN

## 2019-05-13 MED ORDER — METHOCARBAMOL 500 MG IVPB - SIMPLE MED
INTRAVENOUS | Status: AC
  Filled 2019-05-13: qty 50

## 2019-05-13 MED ORDER — TRANEXAMIC ACID-NACL 1000-0.7 MG/100ML-% IV SOLN
1000.0000 mg | INTRAVENOUS | Status: AC
  Administered 2019-05-13: 13:00:00 1000 mg via INTRAVENOUS
  Filled 2019-05-13: qty 100

## 2019-05-13 MED ORDER — HYDROMORPHONE HCL 2 MG PO TABS: 2.0000 mg | ORAL_TABLET | ORAL | 0 refills | Status: DC | PRN

## 2019-05-13 MED ORDER — MENTHOL 3 MG MT LOZG: 1.0000 | LOZENGE | OROMUCOSAL | Status: DC | PRN

## 2019-05-13 MED ORDER — ONDANSETRON HCL 4 MG/2ML IJ SOLN
INTRAMUSCULAR | Status: AC
  Filled 2019-05-13: qty 2

## 2019-05-13 MED ORDER — PROPOFOL 10 MG/ML IV BOLUS
INTRAVENOUS | Status: AC
  Filled 2019-05-13: qty 20

## 2019-05-13 MED ORDER — ACETAMINOPHEN 325 MG PO TABS
325.0000 mg | ORAL_TABLET | Freq: Four times a day (QID) | ORAL | Status: DC | PRN
  Administered 2019-05-13: 650 mg via ORAL
  Filled 2019-05-13 (×2): qty 2

## 2019-05-13 MED ORDER — PROPOFOL 10 MG/ML IV BOLUS
INTRAVENOUS | Status: DC | PRN
  Administered 2019-05-13 (×2): 20 mg via INTRAVENOUS
  Administered 2019-05-13: 30 mg via INTRAVENOUS
  Administered 2019-05-13: 20 mg via INTRAVENOUS

## 2019-05-13 MED ORDER — LEVOTHYROXINE SODIUM 25 MCG PO TABS
25.0000 ug | ORAL_TABLET | Freq: Every day | ORAL | Status: DC
  Administered 2019-05-13 – 2019-05-14 (×2): 25 ug via ORAL
  Filled 2019-05-13 (×2): qty 1

## 2019-05-13 MED ORDER — ROPIVACAINE HCL 7.5 MG/ML IJ SOLN
INTRAMUSCULAR | Status: DC | PRN
  Administered 2019-05-13: 20 mL via PERINEURAL

## 2019-05-13 MED ORDER — ACETAMINOPHEN 500 MG PO TABS
1000.0000 mg | ORAL_TABLET | Freq: Once | ORAL | Status: AC
  Administered 2019-05-13: 1000 mg via ORAL
  Filled 2019-05-13: qty 2

## 2019-05-13 MED ORDER — FENTANYL CITRATE (PF) 100 MCG/2ML IJ SOLN
25.0000 ug | INTRAMUSCULAR | Status: DC | PRN
  Administered 2019-05-13 (×3): 50 ug via INTRAVENOUS

## 2019-05-13 MED ORDER — DOCUSATE SODIUM 100 MG PO CAPS
100.0000 mg | ORAL_CAPSULE | Freq: Two times a day (BID) | ORAL | Status: DC
  Administered 2019-05-13 – 2019-05-15 (×4): 100 mg via ORAL
  Filled 2019-05-13 (×4): qty 1

## 2019-05-13 MED ORDER — PEG 3350 17 G PO PACK
17.0000 g | PACK | Freq: Two times a day (BID) | ORAL | Status: DC
  Filled 2019-05-13: qty 1

## 2019-05-13 MED ORDER — DIPHENHYDRAMINE HCL 12.5 MG/5ML PO ELIX: 12.5000 mg | ORAL_SOLUTION | ORAL | Status: DC | PRN

## 2019-05-13 MED ORDER — TRANEXAMIC ACID 1000 MG/10ML IV SOLN
INTRAVENOUS | Status: DC | PRN
  Administered 2019-05-13: 14:00:00 2000 mg via TOPICAL

## 2019-05-13 MED ORDER — GABAPENTIN 100 MG PO CAPS: 100.0000 mg | ORAL_CAPSULE | Freq: Three times a day (TID) | ORAL | Status: DC

## 2019-05-13 MED ORDER — MIDAZOLAM HCL 2 MG/2ML IJ SOLN
INTRAMUSCULAR | Status: AC
  Filled 2019-05-13: qty 2

## 2019-05-13 MED ORDER — TRANEXAMIC ACID-NACL 1000-0.7 MG/100ML-% IV SOLN: 1000.0000 mg | Freq: Once | INTRAVENOUS | Status: DC

## 2019-05-13 MED ORDER — SODIUM CHLORIDE 0.9 % IR SOLN
Status: DC | PRN
  Administered 2019-05-13 (×2): 1000 mL

## 2019-05-13 MED ORDER — BUPIVACAINE HCL 0.25 % IJ SOLN
INTRAMUSCULAR | Status: DC | PRN
  Administered 2019-05-13: 20 mL

## 2019-05-13 MED ORDER — BUPIVACAINE LIPOSOME 1.3 % IJ SUSP
INTRAMUSCULAR | Status: DC | PRN
  Administered 2019-05-13: 20 mL

## 2019-05-13 MED ORDER — METHOCARBAMOL 500 MG IVPB - SIMPLE MED
500.0000 mg | Freq: Four times a day (QID) | INTRAVENOUS | Status: DC | PRN
  Administered 2019-05-13: 500 mg via INTRAVENOUS
  Filled 2019-05-13: qty 50

## 2019-05-13 MED ORDER — ONDANSETRON HCL 4 MG PO TABS: 4.0000 mg | ORAL_TABLET | Freq: Four times a day (QID) | ORAL | Status: DC | PRN

## 2019-05-13 MED ORDER — FLEET ENEMA 7-19 GM/118ML RE ENEM: 1.0000 | ENEMA | Freq: Once | RECTAL | Status: DC | PRN

## 2019-05-13 MED ORDER — FENTANYL CITRATE (PF) 100 MCG/2ML IJ SOLN
INTRAMUSCULAR | Status: AC
  Filled 2019-05-13: qty 2

## 2019-05-13 MED ORDER — HYDROMORPHONE HCL 1 MG/ML IJ SOLN
INTRAMUSCULAR | Status: AC
  Filled 2019-05-13: qty 1

## 2019-05-13 MED ORDER — DEXAMETHASONE SODIUM PHOSPHATE 10 MG/ML IJ SOLN
10.0000 mg | Freq: Once | INTRAMUSCULAR | Status: AC
  Administered 2019-05-14: 10 mg via INTRAVENOUS
  Filled 2019-05-13: qty 1

## 2019-05-13 MED ORDER — PHENOL 1.4 % MT LIQD
1.0000 | OROMUCOSAL | Status: DC | PRN
  Filled 2019-05-13: qty 177

## 2019-05-13 MED ORDER — WATER FOR IRRIGATION, STERILE IR SOLN
Status: DC | PRN
  Administered 2019-05-13: 2000 mL

## 2019-05-13 MED ORDER — SODIUM CHLORIDE (PF) 0.9 % IJ SOLN
INTRAMUSCULAR | Status: DC | PRN
  Administered 2019-05-13: 50 mL

## 2019-05-13 MED ORDER — ALUM & MAG HYDROXIDE-SIMETH 200-200-20 MG/5ML PO SUSP: 30.0000 mL | ORAL | Status: DC | PRN

## 2019-05-13 MED ORDER — DICYCLOMINE HCL 20 MG PO TABS: 20.0000 mg | ORAL_TABLET | Freq: Three times a day (TID) | ORAL | Status: DC | PRN

## 2019-05-13 MED ORDER — FLUTICASONE PROPIONATE 50 MCG/ACT NA SUSP
2.0000 | Freq: Two times a day (BID) | NASAL | Status: DC
  Administered 2019-05-13 – 2019-05-15 (×4): 2 via NASAL
  Filled 2019-05-13: qty 16

## 2019-05-13 MED ORDER — METHOCARBAMOL 500 MG PO TABS
500.0000 mg | ORAL_TABLET | Freq: Four times a day (QID) | ORAL | Status: DC | PRN
  Administered 2019-05-14 – 2019-05-15 (×4): 500 mg via ORAL
  Filled 2019-05-13 (×4): qty 1

## 2019-05-13 MED ORDER — CEFAZOLIN SODIUM-DEXTROSE 2-4 GM/100ML-% IV SOLN
2.0000 g | INTRAVENOUS | Status: AC
  Administered 2019-05-13: 13:00:00 2 g via INTRAVENOUS
  Filled 2019-05-13: qty 100

## 2019-05-13 MED ORDER — POTASSIUM CHLORIDE CRYS ER 20 MEQ PO TBCR
20.0000 meq | EXTENDED_RELEASE_TABLET | Freq: Every day | ORAL | Status: DC
  Administered 2019-05-13 – 2019-05-15 (×3): 20 meq via ORAL
  Filled 2019-05-13 (×3): qty 1

## 2019-05-13 MED ORDER — PROPOFOL 500 MG/50ML IV EMUL
INTRAVENOUS | Status: DC | PRN
  Administered 2019-05-13: 100 ug/kg/min via INTRAVENOUS

## 2019-05-13 MED ORDER — ASPIRIN 81 MG PO CHEW
81.0000 mg | CHEWABLE_TABLET | Freq: Two times a day (BID) | ORAL | Status: DC
  Administered 2019-05-13 – 2019-05-15 (×4): 81 mg via ORAL
  Filled 2019-05-13 (×4): qty 1

## 2019-05-13 MED ORDER — POLYETHYLENE GLYCOL 3350 17 G PO PACK: 17.0000 g | PACK | Freq: Every day | ORAL | Status: DC | PRN

## 2019-05-13 MED ORDER — ONDANSETRON HCL 4 MG/2ML IJ SOLN
4.0000 mg | Freq: Once | INTRAMUSCULAR | Status: AC | PRN
  Administered 2019-05-13: 4 mg via INTRAVENOUS

## 2019-05-13 MED ORDER — ASPIRIN EC 81 MG PO TBEC: 81.0000 mg | DELAYED_RELEASE_TABLET | Freq: Two times a day (BID) | ORAL | 0 refills | Status: DC

## 2019-05-13 MED ORDER — BUPIVACAINE HCL (PF) 0.25 % IJ SOLN
INTRAMUSCULAR | Status: AC
  Filled 2019-05-13: qty 30

## 2019-05-13 MED ORDER — MEPIVACAINE HCL (PF) 2 % IJ SOLN
INTRAMUSCULAR | Status: DC | PRN
  Administered 2019-05-13: 3 mL via INTRATHECAL

## 2019-05-13 MED ORDER — POVIDONE-IODINE 10 % EX SWAB
2.0000 "application " | Freq: Once | CUTANEOUS | Status: AC
  Administered 2019-05-13: 2 via TOPICAL

## 2019-05-13 MED ORDER — METOCLOPRAMIDE HCL 5 MG/ML IJ SOLN: 5.0000 mg | Freq: Three times a day (TID) | INTRAMUSCULAR | Status: DC | PRN

## 2019-05-13 MED ORDER — CHLORHEXIDINE GLUCONATE 4 % EX LIQD: 60.0000 mL | Freq: Once | CUTANEOUS | Status: DC

## 2019-05-13 MED ORDER — HYDROXYZINE HCL 25 MG PO TABS: 25.0000 mg | ORAL_TABLET | Freq: Three times a day (TID) | ORAL | Status: DC | PRN

## 2019-05-13 MED ORDER — DEXAMETHASONE SODIUM PHOSPHATE 10 MG/ML IJ SOLN
INTRAMUSCULAR | Status: AC
  Filled 2019-05-13: qty 1

## 2019-05-13 MED ORDER — LACTATED RINGERS IV SOLN: INTRAVENOUS | Status: DC

## 2019-05-13 MED ORDER — PHENYLEPHRINE 40 MCG/ML (10ML) SYRINGE FOR IV PUSH (FOR BLOOD PRESSURE SUPPORT)
PREFILLED_SYRINGE | INTRAVENOUS | Status: DC | PRN
  Administered 2019-05-13: 40 ug via INTRAVENOUS
  Administered 2019-05-13 (×2): 80 ug via INTRAVENOUS

## 2019-05-13 MED ORDER — MECLIZINE HCL 25 MG PO TABS: 25.0000 mg | ORAL_TABLET | Freq: Three times a day (TID) | ORAL | Status: DC | PRN

## 2019-05-13 MED ORDER — SODIUM CHLORIDE (PF) 0.9 % IJ SOLN
INTRAMUSCULAR | Status: AC
  Filled 2019-05-13: qty 50

## 2019-05-13 MED ORDER — LACTATED RINGERS IV BOLUS
250.0000 mL | Freq: Once | INTRAVENOUS | Status: AC
  Administered 2019-05-13: 250 mL via INTRAVENOUS

## 2019-05-13 MED ORDER — METOCLOPRAMIDE HCL 5 MG PO TABS: 5.0000 mg | ORAL_TABLET | Freq: Three times a day (TID) | ORAL | Status: DC | PRN

## 2019-05-13 MED ORDER — POLYETHYLENE GLYCOL 3350 17 G PO PACK
17.0000 g | PACK | Freq: Two times a day (BID) | ORAL | Status: DC
  Administered 2019-05-13 – 2019-05-15 (×4): 17 g via ORAL
  Filled 2019-05-13 (×4): qty 1

## 2019-05-13 MED ORDER — TRANEXAMIC ACID-NACL 1000-0.7 MG/100ML-% IV SOLN
INTRAVENOUS | Status: AC
  Administered 2019-05-13: 1000 mg
  Filled 2019-05-13: qty 100

## 2019-05-13 MED ORDER — HYDROMORPHONE HCL 1 MG/ML IJ SOLN
0.5000 mg | INTRAMUSCULAR | Status: DC | PRN
  Administered 2019-05-13: 0.5 mg via INTRAVENOUS
  Administered 2019-05-13: 1 mg via INTRAVENOUS
  Administered 2019-05-13: 0.5 mg via INTRAVENOUS
  Administered 2019-05-14 – 2019-05-15 (×5): 1 mg via INTRAVENOUS
  Filled 2019-05-13 (×6): qty 1

## 2019-05-13 MED ORDER — LACTATED RINGERS IV BOLUS
500.0000 mL | Freq: Once | INTRAVENOUS | Status: AC
  Administered 2019-05-13: 500 mL via INTRAVENOUS

## 2019-05-13 MED ORDER — GABAPENTIN 300 MG PO CAPS
600.0000 mg | ORAL_CAPSULE | Freq: Every day | ORAL | Status: DC
  Administered 2019-05-13 – 2019-05-15 (×7): 600 mg via ORAL
  Filled 2019-05-13 (×8): qty 2

## 2019-05-13 MED ORDER — CLONIDINE HCL (ANALGESIA) 100 MCG/ML EP SOLN
EPIDURAL | Status: DC | PRN
  Administered 2019-05-13: 100 ug

## 2019-05-13 MED ORDER — DEXAMETHASONE SODIUM PHOSPHATE 10 MG/ML IJ SOLN
INTRAMUSCULAR | Status: DC | PRN
  Administered 2019-05-13: 10 mg via INTRAVENOUS

## 2019-05-13 MED ORDER — BISACODYL 5 MG PO TBEC: 5.0000 mg | DELAYED_RELEASE_TABLET | Freq: Every day | ORAL | Status: DC | PRN

## 2019-05-13 MED ORDER — KCL IN DEXTROSE-NACL 20-5-0.45 MEQ/L-%-% IV SOLN
INTRAVENOUS | Status: DC
  Filled 2019-05-13: qty 1000

## 2019-05-13 MED ORDER — PROPOFOL 500 MG/50ML IV EMUL
INTRAVENOUS | Status: AC
  Filled 2019-05-13: qty 50

## 2019-05-13 MED ORDER — PANTOPRAZOLE SODIUM 40 MG PO TBEC
40.0000 mg | DELAYED_RELEASE_TABLET | Freq: Every day | ORAL | Status: DC
  Administered 2019-05-13 – 2019-05-15 (×3): 40 mg via ORAL
  Filled 2019-05-13 (×3): qty 1

## 2019-05-13 MED ORDER — EPHEDRINE 5 MG/ML INJ
INTRAVENOUS | Status: AC
  Filled 2019-05-13: qty 10

## 2019-05-13 MED ORDER — EPHEDRINE SULFATE-NACL 50-0.9 MG/10ML-% IV SOSY
PREFILLED_SYRINGE | INTRAVENOUS | Status: DC | PRN
  Administered 2019-05-13: 10 mg via INTRAVENOUS
  Administered 2019-05-13: 5 mg via INTRAVENOUS

## 2019-05-13 MED ORDER — ONDANSETRON HCL 4 MG/2ML IJ SOLN
INTRAMUSCULAR | Status: DC | PRN
  Administered 2019-05-13: 4 mg via INTRAVENOUS

## 2019-05-13 SURGICAL SUPPLY — 52 items
ATTUNE PSFEM LTSZ6 NARCEM KNEE (Femur) ×3 IMPLANT
ATTUNE PSRP INSR SZ6 5 KNEE (Insert) ×2 IMPLANT
ATTUNE PSRP INSR SZ6 5MM KNEE (Insert) ×1 IMPLANT
BAG DECANTER FOR FLEXI CONT (MISCELLANEOUS) ×3 IMPLANT
BAG ZIPLOCK 12X15 (MISCELLANEOUS) ×3 IMPLANT
BASE TIBIAL ROT PLAT SZ 5 KNEE (Knees) ×1 IMPLANT
BLADE SAG 18X100X1.27 (BLADE) ×3 IMPLANT
BLADE SAW SGTL 11.0X1.19X90.0M (BLADE) ×3 IMPLANT
BLADE SURG SZ10 CARB STEEL (BLADE) ×6 IMPLANT
BNDG ELASTIC 6X10 VLCR STRL LF (GAUZE/BANDAGES/DRESSINGS) ×3 IMPLANT
BOWL SMART MIX CTS (DISPOSABLE) ×3 IMPLANT
CEMENT HV SMART SET (Cement) ×6 IMPLANT
COVER SURGICAL LIGHT HANDLE (MISCELLANEOUS) ×3 IMPLANT
COVER WAND RF STERILE (DRAPES) IMPLANT
CUFF TOURN SGL QUICK 34 (TOURNIQUET CUFF) ×2
CUFF TRNQT CYL 34X4.125X (TOURNIQUET CUFF) ×1 IMPLANT
DECANTER SPIKE VIAL GLASS SM (MISCELLANEOUS) ×9 IMPLANT
DRAPE U-SHAPE 47X51 STRL (DRAPES) ×3 IMPLANT
DRSG AQUACEL AG ADV 3.5X10 (GAUZE/BANDAGES/DRESSINGS) ×3 IMPLANT
DURAPREP 26ML APPLICATOR (WOUND CARE) ×3 IMPLANT
ELECT REM PT RETURN 15FT ADLT (MISCELLANEOUS) ×3 IMPLANT
GLOVE BIO SURGEON STRL SZ7.5 (GLOVE) ×3 IMPLANT
GLOVE BIO SURGEON STRL SZ8.5 (GLOVE) ×3 IMPLANT
GLOVE BIOGEL PI IND STRL 8 (GLOVE) ×1 IMPLANT
GLOVE BIOGEL PI IND STRL 9 (GLOVE) ×1 IMPLANT
GLOVE BIOGEL PI INDICATOR 8 (GLOVE) ×2
GLOVE BIOGEL PI INDICATOR 9 (GLOVE) ×2
GOWN STRL REUS W/TWL XL LVL3 (GOWN DISPOSABLE) ×6 IMPLANT
HANDPIECE INTERPULSE COAX TIP (DISPOSABLE) ×2
HOOD PEEL AWAY FLYTE STAYCOOL (MISCELLANEOUS) ×9 IMPLANT
IMMOBILIZER KNEE 20 (SOFTGOODS) ×3
IMMOBILIZER KNEE 20 THIGH 36 (SOFTGOODS) ×1 IMPLANT
KIT TURNOVER KIT A (KITS) IMPLANT
NEEDLE HYPO 21X1.5 SAFETY (NEEDLE) ×6 IMPLANT
NS IRRIG 1000ML POUR BTL (IV SOLUTION) ×3 IMPLANT
PACK ICE MAXI GEL EZY WRAP (MISCELLANEOUS) ×3 IMPLANT
PACK TOTAL KNEE CUSTOM (KITS) ×3 IMPLANT
PATELLA MEDIAL ATTUN 35MM KNEE (Knees) ×3 IMPLANT
PENCIL SMOKE EVACUATOR (MISCELLANEOUS) IMPLANT
PIN STEINMAN FIXATION KNEE (PIN) ×3 IMPLANT
PROTECTOR NERVE ULNAR (MISCELLANEOUS) ×3 IMPLANT
SET HNDPC FAN SPRY TIP SCT (DISPOSABLE) ×1 IMPLANT
SUT VIC AB 1 CTX 36 (SUTURE) ×2
SUT VIC AB 1 CTX36XBRD ANBCTR (SUTURE) ×1 IMPLANT
SUT VIC AB 3-0 CT1 27 (SUTURE) ×6
SUT VIC AB 3-0 CT1 TAPERPNT 27 (SUTURE) ×3 IMPLANT
SYR CONTROL 10ML LL (SYRINGE) ×6 IMPLANT
TIBIAL BASE ROT PLAT SZ 5 KNEE (Knees) ×3 IMPLANT
TRAY FOLEY MTR SLVR 16FR STAT (SET/KITS/TRAYS/PACK) ×3 IMPLANT
WATER STERILE IRR 1000ML POUR (IV SOLUTION) ×6 IMPLANT
WRAP KNEE MAXI GEL POST OP (GAUZE/BANDAGES/DRESSINGS) ×3 IMPLANT
YANKAUER SUCT BULB TIP 10FT TU (MISCELLANEOUS) ×3 IMPLANT

## 2019-05-13 NOTE — Anesthesia Procedure Notes (Signed)
Spinal  Patient location during procedure: OR Start time: 05/13/2019 1:03 PM End time: 05/13/2019 1:07 PM Staffing Performed: anesthesiologist  Anesthesiologist: Catalina Gravel, MD Preanesthetic Checklist Completed: patient identified, IV checked, risks and benefits discussed, surgical consent, monitors and equipment checked, pre-op evaluation and timeout performed Spinal Block Patient position: sitting Prep: DuraPrep and site prepped and draped Patient monitoring: continuous pulse ox and blood pressure Approach: midline Location: L3-4 Injection technique: single-shot Needle Needle type: Quincke  Needle gauge: 22 G Needle length: 9 cm Assessment Sensory level: T8 Additional Notes Functioning IV was confirmed and monitors were applied. Sterile prep and drape, including hand hygiene, mask and sterile gloves were used. The patient was positioned and the spine was prepped. The skin was anesthetized with lidocaine.  Free flow of clear CSF was obtained prior to injecting local anesthetic into the CSF.  The spinal needle aspirated freely following injection.  The needle was carefully withdrawn.  The patient tolerated the procedure well. Consent was obtained prior to procedure with all questions answered and concerns addressed. Risks including but not limited to bleeding, infection, nerve damage, paralysis, failed block, inadequate analgesia, allergic reaction, high spinal, itching and headache were discussed and the patient wished to proceed.   Hoy Morn, MD

## 2019-05-13 NOTE — Care Plan (Signed)
Ortho Bundle Case Management Note  Patient Details  Name: Kayla Mayo MRN: [id number] Date of Birth: 1955/07/28     Spoke with patient prior to surgery. She will discharge to home with family and HHPT. Referral to [facility]. OPPT arranged with [facility]. She has all needed equipment at home. Patient and MD in agreement with plan. Choice offered                DME Arranged:    DME Agency:     HH Arranged:  PT HH Agency:  [facility] (formerly [facility])  Additional Comments: Please contact me with any questions of if this plan should need to change.  Ladell Heads,  [place]  [phone] 05/13/2019, 8:18 AM

## 2019-05-13 NOTE — Transfer of Care (Signed)
Immediate Anesthesia Transfer of Care Note  Patient: Kayla Mayo  Procedure(s) Performed: LEFT TOTAL KNEE ARTHROPLASTY (Left Knee)  Patient Location: PACU  Anesthesia Type:Spinal  Level of Consciousness: awake and alert   Airway & Oxygen Therapy: Patient Spontanous Breathing and Patient connected to face mask oxygen  Post-op Assessment: Report given to RN and Post -op Vital signs reviewed and stable  Post vital signs: Reviewed and stable  Last Vitals:  Vitals Value Taken Time  BP 101/61 05/13/19 1446  Temp    Pulse    Resp 14 05/13/19 1448  SpO2    Vitals shown include unvalidated device data.  Last Pain:  Vitals:   05/13/19 1243  TempSrc:   PainSc: 0-No pain      Patients Stated Pain Goal: 7 ([id number])  Complications: No apparent anesthesia complications

## 2019-05-13 NOTE — Evaluation (Signed)
Physical Therapy Evaluation Patient Details Name: Kayla Mayo MRN: [id number] DOB: [birth year] Today's Date: 05/13/2019   History of Present Illness  Patient is 64 y.o. female s/p L TKA with PMH significant for vertigo, osteopenia, HTN, HLD, fibromyalgia, breast cancer, DM, and back surgery, R TKA  Clinical Impression  Pt is s/p TKA resulting in the deficits listed below (see PT Problem List).  Pt with significant quad lag, limited ability to WB even with KI in place d/t L knee buckling. Pain also 7-9/10. Pt unsafe to d/c home at current level. RN made aware. Will continue to follow in acute setting.  Pt will benefit from skilled PT to increase their independence and safety with mobility to allow discharge to the venue listed below.      Follow Up Recommendations Follow surgeon's recommendation for DC plan and follow-up therapies    Equipment Recommendations  None recommended by PT    Recommendations for Other Services       Precautions / Restrictions Precautions Precautions: Knee;Fall Restrictions Weight Bearing Restrictions: No Other Position/Activity Restrictions: WBAT      Mobility  Bed Mobility Overal bed mobility: Needs Assistance Bed Mobility: Supine to Sit;Sit to Supine     Supine to sit: Min assist Sit to supine: Min assist   General bed mobility comments: assist with LLE  Transfers Overall transfer level: Needs assistance Equipment used: Rolling walker (2 wheeled) Transfers: Sit to/from Stand Sit to Stand: Min assist;Mod assist         General transfer comment: cues for safety and hand placement. assist to rise and stabilize  Ambulation/Gait             General Gait Details: unable d/t L knee buckling even with KI in place  Stairs            Wheelchair Mobility    Modified Rankin (Stroke Patients Only)       Balance Overall balance assessment: Needs assistance Sitting-balance support: Feet supported Sitting balance-Leahy  Scale: Fair       Standing balance-Leahy Scale: Poor Standing balance comment: reliant on UEs and external assist                             Pertinent Vitals/Pain Pain Assessment: 0-10 Pain Location: L knee Pain Descriptors / Indicators: Grimacing;Sore Pain Intervention(s): Premedicated before session;Monitored during session;Limited activity within patient's tolerance    Home Living Family/patient expects to be discharged to:: Private residence Living Arrangements: Spouse/significant other Available Help at Discharge: Family Type of Home: House Home Access: Level entry;Ramped entrance Entrance Stairs-Rails: None Entrance Stairs-Number of Steps: has a threshold Home Layout: One level Home Equipment: Cane - single point;Walker - 2 wheels;Shower seat;Toilet riser;Electric scooter      Prior Function           Comments: using SPC to walk     Hand Dominance        Extremity/Trunk Assessment   Upper Extremity Assessment Upper Extremity Assessment: Overall WFL for tasks assessed    Lower Extremity Assessment Lower Extremity Assessment: LLE deficits/detail LLE Deficits / Details: ankle df limited to neutral d/t pain, 20 degree quad lag, hip flexion 2 to 2+/5; sensation intact       Communication   Communication: No difficulties  Cognition Arousal/Alertness: Awake/alert Behavior During Therapy: WFL for tasks assessed/performed Overall Cognitive Status: Within Functional Limits for tasks assessed  General Comments      Exercises Total Joint Exercises Ankle Circles/Pumps: AROM;Both;10 reps Quad Sets: AROM;Both;5 reps   Assessment/Plan    PT Assessment Patient needs continued PT services  PT Problem List Decreased strength;Decreased range of motion;Decreased activity tolerance;Decreased balance;Pain;Decreased knowledge of use of DME;Decreased mobility       PT Treatment Interventions DME  instruction;Gait training;Functional mobility training;Therapeutic activities;Patient/family education;Therapeutic exercise    PT Goals (Current goals can be found in the Care Plan section)  Acute Rehab PT Goals PT Goal Formulation: With patient Time For Goal Achievement: 05/20/19 Potential to Achieve Goals: Good    Frequency 7X/week   Barriers to discharge        Co-evaluation               AM-PAC PT "6 Clicks" Mobility  Outcome Measure Help needed turning from your back to your side while in a flat bed without using bedrails?: A Little Help needed moving from lying on your back to sitting on the side of a flat bed without using bedrails?: A Little Help needed moving to and from a bed to a chair (including a wheelchair)?: A Lot Help needed standing up from a chair using your arms (e.g., wheelchair or bedside chair)?: A Lot Help needed to walk in hospital room?: Total Help needed climbing 3-5 steps with a railing? : Total 6 Click Score: 12    End of Session Equipment Utilized During Treatment: Gait belt Activity Tolerance: Patient limited by pain;Other (comment)(LLE weakness d/t adductor block) Patient left: with call bell/phone within reach;in bed   PT Visit Diagnosis: Difficulty in walking, not elsewhere classified (R26.2)    Time: [id number] PT Time Calculation (min) (ACUTE ONLY): 15 min   Charges:   PT Evaluation $PT Eval Low Complexity: [place], PT   Acute Rehab Dept [facility]): [id number]   05/13/2019   [facility] 05/13/2019, 7:10 PM

## 2019-05-13 NOTE — Op Note (Signed)
PATIENT ID:      ZELNA POARCH  MRN:     [id number] DOB/AGE:    10-30-1955 / 64 y.o.       OPERATIVE REPORT   DATE OF PROCEDURE:  05/13/2019      PREOPERATIVE DIAGNOSIS:   LEFT KNEE OSTEOARTHRITIS      Estimated body mass index is 30.45 kg/m as calculated from the following:   Height as of 05/08/19: 5\' 5"  (1.651 m).   Weight as of 05/08/19: 83 kg.                                                       POSTOPERATIVE DIAGNOSIS:   same                                  PROCEDURE:  Procedure(s): LEFT TOTAL KNEE ARTHROPLASTY Using DepuyAttune RP implants #6NL Femur, #5Tibia, 5 mm Attune RP bearing, 35 Patella    SURGEON: Kerin Salen  ASSISTANT:   Kerry Hough. [employer]   (Present and scrubbed throughout the case, critical for assistance with exposure, retraction, instrumentation, and closure.)        ANESTHESIA: Spinal, 20cc Exparel, 50cc 0.25% Marcaine EBL: 300 cc FLUID REPLACEMENT: 1500 cc crystaloid TOURNIQUET: DRAINS: None TRANEXAMIC ACID: 1gm IV, 2gm topical COMPLICATIONS:  None         INDICATIONS FOR PROCEDURE: The patient has  LEFT KNEE OSTEOARTHRITIS, Val deformities, XR shows bone on bone arthritis, lateral subluxation of tibia. Patient has failed all conservative measures including anti-inflammatory medicines, narcotics, attempts at exercise and weight loss, cortisone injections and viscosupplementation.  Risks and benefits of surgery have been discussed, questions answered.   DESCRIPTION OF PROCEDURE: The patient identified by armband, received  IV antibiotics, in the holding area at [facility]. Patient taken to the operating room, appropriate anesthetic monitors were attached, and Spinal anesthesia was  induced. IV Tranexamic acid was given.Tourniquet applied high to the operative thigh. Lateral post and foot positioner applied to the table, the lower extremity was then prepped and draped in usual sterile fashion from the toes to the tourniquet. Time-out procedure was  performed. The skin and subcutaneous tissue along the incision was injected with 20 cc of a mixture of Exparel and Marcaine solution, using a 20-gauge by 1-1/2 inch needle. We began the operation, with the knee flexed 130 degrees, by making the anterior midline incision starting at handbreadth above the patella going over the patella 1 cm medial to and 4 cm distal to the tibial tubercle. Small bleeders in the skin and the subcutaneous tissue identified and cauterized. Transverse retinaculum was incised and reflected medially and a medial parapatellar arthrotomy was accomplished. the patella was everted and theprepatellar fat pad resected. The superficial medial collateral ligament was then elevated from anterior to posterior along the proximal flare of the tibia and anterior half of the menisci resected. The knee was hyperflexed exposing bone on bone arthritis. Peripheral and notch osteophytes as well as the cruciate ligaments were then resected. We continued to work our way around posteriorly along the proximal tibia, and externally rotated the tibia subluxing it out from underneath the femur. A McHale PCL retractor was placed through the notch and a lateral Hohmann retractor placed, and we then  entered the proximal tibia in line with the Depuy starter drill in line with the axis of the tibia followed by an intramedullary guide rod and 0-degree posterior slope cutting guide. The tibial cutting guide, 4 degree posterior sloped, was pinned into place allowing resection of 7 mm of bone medially and 3 mm of bone laterally. Satisfied with the tibial resection, we then entered the distal femur 2 mm anterior to the PCL origin with the intramedullary guide rod and applied the distal femoral cutting guide set at 9 mm, with 5 degrees of valgus. This was pinned along the epicondylar axis. At this point, the distal femoral cut was accomplished without difficulty. We then sized for a #6L femoral component and pinned the guide  in 0 degrees of external rotation. The chamfer cutting guide was pinned into place. The anterior, posterior, and chamfer cuts were accomplished without difficulty followed by the Attune RP box cutting guide and the box cut. We also removed posterior osteophytes from the posterior femoral condyles. The posterior capsule was injected with Exparel solution. The knee was brought into full extension. We checked our extension gap and fit a 5 mm bearing. Distracting in extension with a lamina spreader,  bleeders in the posterior capsule, Posterior medial and posterior lateral gutter were cauterized.  The transexamic acid-soaked sponge was then placed in the gap of the knee in extension. The knee was flexed 30. The posterior patella cut was accomplished with the 9.5 mm Attune cutting guide, sized for a 70mm dome, and the fixation pegs drilled.The knee was then once again hyperflexed exposing the proximal tibia. We sized for a # 5 tibial base plate, applied the smokestack and the conical reamer followed by the the Delta fin keel punch. We then hammered into place the Attune RP trial femoral component, drilled the lugs, inserted a  5 mm trial bearing, trial patellar button, and took the knee through range of motion from 0-130 degrees. Medial and lateral ligamentous stability was checked. No thumb pressure was required for patellar Tracking. The tourniquet was not used. All trial components were removed, mating surfaces irrigated with pulse lavage, and dried with suction and sponges. 10 cc of the Exparel solution was applied to the cancellus bone of the patella distal femur and proximal tibia.  After waiting 30 seconds, the bony surfaces were again, dried with sponges. A double batch of DePuy HV cement was mixed and applied to all bony metallic mating surfaces except for the posterior condyles of the femur itself. In order, we hammered into place the tibial tray and removed excess cement, the femoral component and removed  excess cement. The final Attune RP bearing was inserted, and the knee brought to full extension with compression. The patellar button was clamped into place, and excess cement removed. The knee was held at 30 flexion with compression, while the cement cured. The wound was irrigated out with normal saline solution pulse lavage. The rest of the Exparel was injected into the parapatellar arthrotomy, subcutaneous tissues, and periosteal tissues. The parapatellar arthrotomy was closed with running #1 Vicryl suture. The subcutaneous tissue with 0 and 2-0 undyed Vicryl suture, and the skin with running 3-0 SQ vicryl. An Aquacil and Ace wrap were applied. The patient was taken to recovery room without difficulty.   Kerin Salen 05/13/2019, 7:30 AM

## 2019-05-13 NOTE — Anesthesia Postprocedure Evaluation (Signed)
Anesthesia Post Note  Patient: Kayla Mayo  Procedure(s) Performed: LEFT TOTAL KNEE ARTHROPLASTY (Left Knee)     Patient location during evaluation: PACU Anesthesia Type: Spinal Level of consciousness: oriented and awake and alert Pain management: pain level controlled Vital Signs Assessment: post-procedure vital signs reviewed and stable Respiratory status: spontaneous breathing, respiratory function stable, patient connected to nasal cannula oxygen and nonlabored ventilation Cardiovascular status: blood pressure returned to baseline and stable Postop Assessment: no headache, no backache, no apparent nausea or vomiting, spinal receding and patient able to bend at knees Anesthetic complications: no    Last Vitals:  Vitals:   05/13/19 1515 05/13/19 1530  BP: 105/65 99/67  Pulse: 67 66  Resp: 20 15  Temp:    SpO2: 100% 100%    Last Pain:  Vitals:   05/13/19 1515  TempSrc:   PainSc: 0-No pain                 [name]

## 2019-05-13 NOTE — Interval H&P Note (Signed)
History and Physical Interval Note:  05/13/2019 11:32 AM  Kayla Mayo  has presented today for surgery, with the diagnosis of LEFT KNEE OSTEOARTHRITIS.  The various methods of treatment have been discussed with the patient and family. After consideration of risks, benefits and other options for treatment, the patient has consented to  Procedure(s): LEFT TOTAL KNEE ARTHROPLASTY (Left) as a surgical intervention.  The patient's history has been reviewed, patient examined, no change in status, stable for surgery.  I have reviewed the patient's chart and labs.  Questions were answered to the patient's satisfaction.     Kerin Salen

## 2019-05-13 NOTE — Discharge Instructions (Signed)

## 2019-05-13 NOTE — Anesthesia Procedure Notes (Signed)
Anesthesia Regional Block: Adductor canal block   Pre-Anesthetic Checklist: ,, timeout performed, Correct Patient, Correct Site, Correct Laterality, Correct Procedure, Correct Position, site marked, Risks and benefits discussed,  Surgical consent,  Pre-op evaluation,  At surgeon's request and post-op pain management  Laterality: Left  Prep: chloraprep       Needles:  Injection technique: Single-shot  Needle Type: Echogenic Needle     Needle Length: 9cm  Needle Gauge: 21     Additional Needles:   Procedures:,,,, ultrasound used (permanent image in chart),,,,  Narrative:  Start time: 05/13/2019 12:16 PM End time: 05/13/2019 12:21 PM Injection made incrementally with aspirations every 5 mL.  Performed by: Personally  Anesthesiologist: Catalina Gravel, MD  Additional Notes: No pain on injection. No increased resistance to injection. Injection made in 5cc increments.  Good needle visualization.  Patient tolerated procedure well.

## 2019-05-13 NOTE — Anesthesia Preprocedure Evaluation (Addendum)
Anesthesia Evaluation  Patient identified by MRN, date of birth, ID band Patient awake    Reviewed: Allergy & Precautions, NPO status , Patient's Chart, lab work & pertinent test results  History of Anesthesia Complications (+) PONV and history of anesthetic complications  Airway Mallampati: II  TM Distance: >3 FB Neck ROM: Full    Dental  (+) Teeth Intact, Dental Advisory Given   Pulmonary neg pulmonary ROS,    Pulmonary exam normal breath sounds clear to auscultation       Cardiovascular hypertension, Normal cardiovascular exam Rhythm:Regular Rate:Normal     Neuro/Psych  Headaches, PSYCHIATRIC DISORDERS Anxiety Depression  Neuromuscular disease    GI/Hepatic Neg liver ROS, GERD  Medicated,  Endo/Other  diabetes, Well Controlled, Type 2, Oral Hypoglycemic AgentsHypothyroidism Obesity   Renal/GU negative Renal ROS     Musculoskeletal  (+) Arthritis , Fibromyalgia -  Abdominal   Peds  Hematology Plt 238k   Anesthesia Other Findings Day of surgery medications reviewed with the patient.  Reproductive/Obstetrics                           Anesthesia Physical Anesthesia Plan  ASA: II  Anesthesia Plan: Spinal   Post-op Pain Management:  Regional for Post-op pain   Induction:   PONV Risk Score and Plan: 3 and Propofol infusion  Airway Management Planned: Natural Airway and Nasal Cannula  Additional Equipment:   Intra-op Plan:   Post-operative Plan:   Informed Consent: I have reviewed the patients History and Physical, chart, labs and discussed the procedure including the risks, benefits and alternatives for the proposed anesthesia with the patient or authorized representative who has indicated his/her understanding and acceptance.     Dental advisory given  Plan Discussed with: CRNA, Anesthesiologist and Surgeon  Anesthesia Plan Comments:         Anesthesia Quick  Evaluation

## 2019-05-13 NOTE — Anesthesia Procedure Notes (Signed)
Date/Time: 05/13/2019 12:58 PM Performed by: Sharlette Dense, CRNA Oxygen Delivery Method: Simple face mask

## 2019-05-13 NOTE — Progress Notes (Signed)
AssistedDr. Turk with left, ultrasound guided, adductor canal block. Side rails up, monitors on throughout procedure. See vital signs in flow sheet. Tolerated Procedure well.  

## 2019-05-14 ENCOUNTER — Encounter: Payer: Self-pay | Admitting: *Deleted

## 2019-05-14 ENCOUNTER — Other Ambulatory Visit: Payer: Self-pay

## 2019-05-14 DIAGNOSIS — M1712 Unilateral primary osteoarthritis, left knee: Secondary | ICD-10-CM | POA: Diagnosis not present

## 2019-05-14 LAB — CBC
HCT: 31.8 % — ABNORMAL LOW (ref 36.0–46.0)
Hemoglobin: 9.8 g/dL — ABNORMAL LOW (ref 12.0–15.0)
MCH: 30.6 pg (ref 26.0–34.0)
MCHC: 30.8 g/dL (ref 30.0–36.0)
MCV: 99.4 fL (ref 80.0–100.0)
Platelets: 168 10*3/uL (ref 150–400)
RBC: 3.2 MIL/uL — ABNORMAL LOW (ref 3.87–5.11)
RDW: 13.6 % (ref 11.5–15.5)
WBC: 6.5 10*3/uL (ref 4.0–10.5)
nRBC: 0 % (ref 0.0–0.2)

## 2019-05-14 LAB — HEMOGLOBIN A1C
Hgb A1c MFr Bld: 6.3 % — ABNORMAL HIGH (ref 4.8–5.6)
Mean Plasma Glucose: 134.11 mg/dL

## 2019-05-14 LAB — BASIC METABOLIC PANEL
Anion gap: 5 (ref 5–15)
BUN: 11 mg/dL (ref 8–23)
CO2: 26 mmol/L (ref 22–32)
Calcium: 8.7 mg/dL — ABNORMAL LOW (ref 8.9–10.3)
Chloride: 106 mmol/L (ref 98–111)
Creatinine, Ser: 0.82 mg/dL (ref 0.44–1.00)
GFR calc Af Amer: >60 mL/min (ref >60)
GFR calc non Af Amer: >60 mL/min (ref >60)
Glucose, Bld: 220 mg/dL — ABNORMAL HIGH (ref 70–99)
Potassium: 4.8 mmol/L (ref 3.5–5.1)
Sodium: 137 mmol/L (ref 135–145)

## 2019-05-14 LAB — GLUCOSE, CAPILLARY
Glucose-Capillary: 180 mg/dL — ABNORMAL HIGH (ref 70–99)
Glucose-Capillary: 196 mg/dL — ABNORMAL HIGH (ref 70–99)
Glucose-Capillary: 203 mg/dL — ABNORMAL HIGH (ref 70–99)
Glucose-Capillary: 229 mg/dL — ABNORMAL HIGH (ref 70–99)
Glucose-Capillary: 236 mg/dL — ABNORMAL HIGH (ref 70–99)

## 2019-05-14 MED ORDER — HYDROCODONE-ACETAMINOPHEN 5-325 MG PO TABS
1.0000 | ORAL_TABLET | ORAL | Status: DC | PRN
  Administered 2019-05-14 – 2019-05-15 (×7): 2 via ORAL
  Filled 2019-05-14 (×7): qty 2

## 2019-05-14 NOTE — Progress Notes (Signed)
Patient ID: Kayla Mayo, female   DOB: 26-Oct-1955, 64 y.o.   MRN: [id number] PATIENT ID: Kayla Mayo  MRN: [id number]  DOB/AGE:  1955/05/07 / 64 y.o.  1 Day Post-Op Procedure(s) (LRB): LEFT TOTAL KNEE ARTHROPLASTY (Left)    PROGRESS NOTE Subjective: Patient is alert, oriented, no Nausea, no Vomiting, yes passing gas. Taking PO well. Denies SOB, Chest or Calf Pain. Using Incentive Spirometer, PAS in place. Ambulate weak quad, Patient reports pain as 5/10 .    Objective: Vital signs in last 24 hours: Vitals:   05/13/19 2000 05/13/19 2039 05/14/19 0120 05/14/19 0507  BP: 117/81 112/79 118/71 105/90  Pulse:  77 71 65  Resp:  17 17 17   Temp: 98.7 F (37.1 C) 98.3 F (36.8 C) 97.9 F (36.6 C) 97.9 F (36.6 C)  TempSrc:  Oral Oral Oral  SpO2: 97% 100% 100% 100%  Weight:      Height:          Intake/Output from previous day: I/O last 3 completed shifts: In: 1970.6 [P.O.:180; I.V.:1590.6; IV Piggyback:200] Out: 1800 [Urine:1700; Blood:100]   Intake/Output this shift: No intake/output data recorded.   LABORATORY DATA: Recent Labs    05/13/19 1101 05/13/19 1532 05/14/19 0004 05/14/19 0418  WBC  --   --   --  6.5  HGB  --   --   --  9.8*  HCT  --   --   --  31.8*  PLT  --   --   --  168  NA  --   --   --  137  K  --   --   --  4.8  CL  --   --   --  106  CO2  --   --   --  26  BUN  --   --   --  11  CREATININE  --   --   --  0.82  GLUCOSE  --   --   --  220*  GLUCAP 116* 151* 236*  --   CALCIUM  --   --   --  8.7*    Examination: Neurologically intact ABD soft Neurovascular intact Sensation intact distally Intact pulses distally Dorsiflexion/Plantar flexion intact Incision: dressing C/D/I No cellulitis present Compartment soft} Weak Quad, no effusion, PROM 0-90  Assessment:   1 Day Post-Op Procedure(s) (LRB): LEFT TOTAL KNEE ARTHROPLASTY (Left) ADDITIONAL DIAGNOSIS: Expected Acute Blood Loss Anemia, Diabetes and Hypertension  Patient's anticipated  LOS is less than 2 midnights, meeting these requirements: - Younger than 41 - Lives within 1 hour of care - Has a competent adult at home to recover with post-op recover - NO history of  - Chronic pain requiring opiods  - Diabetes  - Coronary Artery Disease  - Heart failure  - Heart attack  - Stroke  - DVT/VTE  - Cardiac arrhythmia  - Respiratory Failure/COPD  - Renal failure  - Anemia  - Advanced Liver disease       Plan: PT/OT WBAT, AROM and PROM, Hydrocodone PO DVT Prophylaxis:  SCDx72hrs, ASA 81 mg BID x 2 weeks DISCHARGE PLAN: Home , today if passes PT  DISCHARGE NEEDS: HHPT, Walker and 3-in-1 comode seat     Kerin Salen 05/14/2019, 7:11 AM

## 2019-05-14 NOTE — Care Management Obs Status (Signed)
[facility] NOTIFICATION   Patient Details  Name: Kayla Mayo MRN: [id number] Date of Birth: 1955-12-03   Medicare Observation Status Notification Given:  Yes    Leeroy Cha, RN 05/14/2019, 11:02 AM

## 2019-05-14 NOTE — TOC Initial Note (Signed)
Transition of Care [facility]) - Initial/Assessment Note    Patient Details  Name: Kayla Mayo MRN: [id number] Date of Birth: [birth year]  Transition of Care [facility]) CM/SW Contact:    Leeroy Cha, RN Phone Number: 05/14/2019, 10:17 AM  Clinical Narrative:                 [id number]  Expected Discharge Plan: [place] Barriers to Discharge: No Barriers Identified   Patient Goals and CMS Choice Patient states their goals for this hospitalization and ongoing recovery are:: to go home CMS Medicare.gov Compare Post Acute Care list provided to:: Patient Choice offered to / list presented to : Patient  Expected Discharge Plan and Services Expected Discharge Plan: [place]   Discharge Planning Services: CM Consult Post Acute Care Choice: Home Health, Durable Medical Equipment Living arrangements for the past 2 months: Single Family Home Expected Discharge Date: 05/13/19               DME Arranged: Berta Minor rolling DME Agency: [employer] Date DME Agency Contacted: 05/14/19 Time DME Agency Contacted: [phone] Representative spoke with at DME Agency: [place]: PT [place]: [facility] [employer]) Date [place]: 05/14/19 Time [place]: 45 Representative spoke with at [place]: [place] Arrangements/Services Living arrangements for the past 2 months: [place] Lives with:: Spouse Patient language and need for interpreter reviewed:: No Do you feel safe going back to the place where you live?: Yes      Need for Family Participation in Patient Care: Yes (Comment)     Criminal Activity/Legal Involvement Pertinent to Current Situation/Hospitalization: No - Comment as needed  Activities of Daily Living Home Assistive Devices/Equipment: CBG Meter, Dentures (specify type), Cane (specify quad or straight) ADL Screening (condition at time of  admission) Patient's cognitive ability adequate to safely complete daily activities?: Yes Is the patient deaf or have difficulty hearing?: No Does the patient have difficulty seeing, even when wearing glasses/contacts?: No Does the patient have difficulty concentrating, remembering, or making decisions?: No Patient able to express need for assistance with ADLs?: Yes Does the patient have difficulty dressing or bathing?: No Independently performs ADLs?: Yes (appropriate for developmental age) Does the patient have difficulty walking or climbing stairs?: Yes Weakness of Legs: Both Weakness of Arms/Hands: None  Permission Sought/Granted                  Emotional Assessment Appearance:: Appears stated age     Orientation: : Oriented to Self, Oriented to Place, Oriented to  Time, Oriented to Situation Alcohol / Substance Use: Not Applicable Psych Involvement: No (comment)  Admission diagnosis:  Status post left knee replacement [Z96.652] Patient Active Problem List   Diagnosis Date Noted  . Status post left knee replacement 05/13/2019  . Degenerative arthritis of left knee 05/10/2019  . S/P total knee replacement, right 03/04/2019  . PONV (postoperative nausea and vomiting) 03/02/2019  . Osteoarthritis of right knee 03/01/2019  . Chronic pain syndrome 12/30/2015  . Relationship problem between partners 12/30/2015  . Anemia of decreased vitamin B12 absorption 10/12/2015  . Iron deficiency anemia 11/28/2013  . Major depressive disorder, recurrent ([place]) 10/06/2012  . GAD (generalized anxiety disorder) 08/22/2012  . Osteopenia   . Vitamin D deficiency 03/23/2011  . Insomnia 03/23/2011  . Anemia 03/17/2011  . Proteinuria 10/19/2010  . Back pain 10/19/2010  . Diabetes mellitus with peripheral  autonomic neuropathy ([place]) 07/27/2010  . Weight gain 07/27/2010  . SHOULDER PAIN, RIGHT 05/12/2010  . GASTROENTERITIS WITHOUT DEHYDRATION 09/17/2009  . DRY SKIN 06/03/2009  .  HYPERLIPIDEMIA 05/07/2009  . Anemia due to other cause 05/07/2009  . Major depressive disorder, recurrent episode, moderate ([place]) 05/07/2009  . ESSENTIAL HYPERTENSION, BENIGN 05/07/2009  . ARTHRITIS, KNEES, BILATERAL 05/07/2009  . DM 01/21/2009  . DYSPEPSIA&OTHER [facility] DISORDERS FUNCTION STOMACH 01/21/2009  . CONSTIPATION 01/21/2009  . DYSPHAGIA UNSPECIFIED 01/21/2009   PCP:  [name], MD Pharmacy:   [facility] [place]), [place] - [address] [place] [address] Phone: [phone] Fax: [phone]     Social Determinants of Health (SDOH) Interventions    Readmission Risk Interventions No flowsheet data found.

## 2019-05-14 NOTE — Progress Notes (Signed)
Physical Therapy Treatment Patient Details Name: Kayla Mayo MRN: [id number] DOB: December 19, 1955 Today's Date: 05/14/2019    History of Present Illness Patient is 64 y.o. female s/p L TKA with PMH significant for vertigo, osteopenia, HTN, HLD, fibromyalgia, breast cancer, DM, and back surgery, R TKA    PT Comments    Pt unable to progress today d/t LLE weakness and pain. Pt unable to WB LLE d/t L knee buckling in standing, KI in place. Very poor eccentirc quad control with knee exercises in supine. Will continue to follow. Pt is unsaef to d/c home at this time from PT standpoint  Follow Up Recommendations  Follow surgeon's recommendation for DC plan and follow-up therapies     Equipment Recommendations  None recommended by PT    Recommendations for Other Services       Precautions / Restrictions Precautions Precautions: Knee;Fall Required Braces or Orthoses: Knee Immobilizer - Left Restrictions Weight Bearing Restrictions: No Other Position/Activity Restrictions: WBAT    Mobility  Bed Mobility Overal bed mobility: Needs Assistance Bed Mobility: Supine to Sit     Supine to sit: Min assist     General bed mobility comments: assist with LLE  Transfers Overall transfer level: Needs assistance Equipment used: Rolling walker (2 wheeled) Transfers: Sit to/from Stand;Lateral/Scoot Transfers Sit to Stand: Mod assist;Max assist        Lateral/Scoot Transfers: Min assist;Mod assist General transfer comment: cues for safety and hand placement. assist to rise and stabilize; d/t LLE weakness performed lateral scooting bed to chair transfer   Ambulation/Gait             General Gait Details: unable d/t L knee buckling even with KI in place   Stairs             Wheelchair Mobility    Modified Rankin (Stroke Patients Only)       Balance   Sitting-balance support: Feet supported Sitting balance-Leahy Scale: Fair       Standing balance-Leahy Scale:  Poor Standing balance comment: reliant on UEs and external assist                            Cognition Arousal/Alertness: Awake/alert Behavior During Therapy: WFL for tasks assessed/performed Overall Cognitive Status: Within Functional Limits for tasks assessed                                        Exercises Total Joint Exercises Ankle Circles/Pumps: AROM;Both;5 reps Quad Sets: AROM;Both;5 reps Heel Slides: AAROM;Both;5 reps;Limitations Heel Slides Limitations: poor eccentric quad control Straight Leg Raises: AAROM;Left;5 reps;Other (comment)(with KI in place --AA)    General Comments        Pertinent Vitals/Pain Pain Assessment: 0-10 Pain Score: 5  Pain Location: L knee Pain Descriptors / Indicators: Grimacing;Sore Pain Intervention(s): Limited activity within patient's tolerance;Monitored during session;Premedicated before session;Repositioned;Patient requesting pain meds-RN notified    Home Living                      Prior Function            PT Goals (current goals can now be found in the care plan section) Acute Rehab PT Goals Patient Stated Goal: to go home  PT Goal Formulation: With patient Time For Goal Achievement: 05/20/19 Potential to Achieve Goals: Good Progress towards PT goals: Progressing  toward goals    Frequency    7X/week      PT Plan Current plan remains appropriate    Co-evaluation              AM-PAC PT "6 Clicks" Mobility   Outcome Measure  Help needed turning from your back to your side while in a flat bed without using bedrails?: A Little Help needed moving from lying on your back to sitting on the side of a flat bed without using bedrails?: A Little Help needed moving to and from a bed to a chair (including a wheelchair)?: Total Help needed standing up from a chair using your arms (e.g., wheelchair or bedside chair)?: A Lot Help needed to walk in hospital room?: Total Help needed  climbing 3-5 steps with a railing? : Total 6 Click Score: 11    End of Session Equipment Utilized During Treatment: Left knee immobilizer;Gait belt Activity Tolerance: Other (comment);No increased pain(LLE weakness) Patient left: in chair;with call bell/phone within reach   PT Visit Diagnosis: Difficulty in walking, not elsewhere classified (R26.2)     Time: [id number] PT Time Calculation (min) (ACUTE ONLY): 25 min  Charges:  $Therapeutic Activity: 23-37 mins                     Wyley Hack, PT   Acute Rehab Dept [facility]): [id number]   05/14/2019    [facility] 05/14/2019, 11:54 AM

## 2019-05-14 NOTE — Progress Notes (Signed)
05/14/19 1400  PT Visit Information  Last PT Received On 05/14/19  Pt progressing slowly d/t LLE weakness and pain. Unable to stand and WB LLE, performed lateral scooting transfer from chair to bed. Pt making excellent effort to self assist with UEs and RLE. Will continue to follow in acute setting. Unsafe to d/c home at current status  Assistance Needed +1  History of Present Illness Patient is 64 y.o. female s/p L TKA with PMH significant for vertigo, osteopenia, HTN, HLD, fibromyalgia, breast cancer, DM, and back surgery, R TKA  Subjective Data  Patient Stated Goal to go home   Precautions  Precautions Knee;Fall  Required Braces or Orthoses Knee Immobilizer - Left  Restrictions  Weight Bearing Restrictions No  Other Position/Activity Restrictions WBAT  Pain Assessment  Pain Assessment 0-10  Pain Score 8  Pain Location L knee  Pain Descriptors / Indicators Grimacing;Sore  Pain Intervention(s) Limited activity within patient's tolerance;Monitored during session;Premedicated before session;Repositioned;Patient requesting pain meds-RN notified;Ice applied  Cognition  Arousal/Alertness Awake/alert  Behavior During Therapy WFL for tasks assessed/performed  Overall Cognitive Status Within Functional Limits for tasks assessed  Bed Mobility  Overal bed mobility Needs Assistance  Bed Mobility Sit to Supine  Sit to supine Min assist  General bed mobility comments assist with LLE, incr time d/t pain  Transfers  Overall transfer level Needs assistance  Equipment used Rolling walker (2 wheeled)  Transfers Lateral/Scoot Transfers   Lateral/Scoot Transfers Min assist;Mod assist  General transfer comment cues for safety and hand placement. assist to rise and stabilize; d/t LLE weakness performed lateral scooting transfer chair to bed  using bil UEs and RLE   Ambulation/Gait  General Gait Details unable d/t L knee buckling even with KI in place  Balance  Sitting-balance support Feet  supported  Sitting balance-Leahy Scale Fair  Standing balance-Leahy Scale Poor  Standing balance comment reliant on UEs and external assist  Total Joint Exercises  Ankle Circles/Pumps AROM;Both;5 reps  PT - End of Session  Equipment Utilized During Treatment Left knee immobilizer;Gait belt  Activity Tolerance Other (comment);Patient tolerated treatment well;Patient limited by pain (LLE weakness)  Patient left with call bell/phone within reach;in bed;with bed alarm set   PT - Assessment/Plan  PT Plan Current plan remains appropriate  PT Visit Diagnosis Difficulty in walking, not elsewhere classified (R26.2)  PT Frequency (ACUTE ONLY) 7X/week  Follow Up Recommendations Follow surgeon's recommendation for DC plan and follow-up therapies  PT equipment None recommended by PT  AM-PAC PT "6 Clicks" Mobility Outcome Measure (Version 2)  Help needed turning from your back to your side while in a flat bed without using bedrails? 3  Help needed moving from lying on your back to sitting on the side of a flat bed without using bedrails? 3  Help needed moving to and from a bed to a chair (including a wheelchair)? 2  Help needed standing up from a chair using your arms (e.g., wheelchair or bedside chair)? 2  Help needed to walk in hospital room? 1  Help needed climbing 3-5 steps with a railing?  1  6 Click Score 12  Consider Recommendation of Discharge To: CIR/SNF/LTACH  PT Goal Progression  Progress towards PT goals Progressing toward goals  Acute Rehab PT Goals  PT Goal Formulation With patient  Time For Goal Achievement 05/20/19  Potential to Achieve Goals Good  PT Time Calculation  PT Start Time (ACUTE ONLY) 1405  PT Stop Time (ACUTE ONLY) 1430  PT Time Calculation (  min) (ACUTE ONLY) 25 min  PT Treatments  $Therapeutic Activity 23-37 mins

## 2019-05-14 NOTE — Care Management CC44 (Signed)
Condition Code 44 Documentation Completed  Patient Details  Name: Kayla Mayo MRN: [id number] Date of Birth: Jun 23, 1955   Condition Code 44 given:  Yes Patient signature on Condition Code 44 notice:  Yes Documentation of 2 MD's agreement:  Yes Code 44 added to claim:  Yes    Leeroy Cha, RN 05/14/2019, 11:02 AM

## 2019-05-14 NOTE — Progress Notes (Signed)
Jaclyn Shaggy aware patient did not pass PT and states he will see her in the morning.

## 2019-05-15 DIAGNOSIS — M1712 Unilateral primary osteoarthritis, left knee: Secondary | ICD-10-CM | POA: Diagnosis not present

## 2019-05-15 LAB — CBC
HCT: 30.4 % — ABNORMAL LOW (ref 36.0–46.0)
Hemoglobin: 9.4 g/dL — ABNORMAL LOW (ref 12.0–15.0)
MCH: 30 pg (ref 26.0–34.0)
MCHC: 30.9 g/dL (ref 30.0–36.0)
MCV: 97.1 fL (ref 80.0–100.0)
Platelets: 169 10*3/uL (ref 150–400)
RBC: 3.13 MIL/uL — ABNORMAL LOW (ref 3.87–5.11)
RDW: 13.5 % (ref 11.5–15.5)
WBC: 7.3 10*3/uL (ref 4.0–10.5)
nRBC: 0 % (ref 0.0–0.2)

## 2019-05-15 LAB — GLUCOSE, CAPILLARY: Glucose-Capillary: 199 mg/dL — ABNORMAL HIGH (ref 70–99)

## 2019-05-15 NOTE — Plan of Care (Signed)

## 2019-05-15 NOTE — Discharge Summary (Signed)
Patient ID: DARSHAY DEUPREE MRN: [id number] DOB/AGE: 1956-01-01 64 y.o.  Admit date: 05/13/2019 Discharge date: 05/15/2019  Admission Diagnoses:  Principal Problem:   Degenerative arthritis of left knee Active Problems:   Status post left knee replacement   Discharge Diagnoses:  Same  Past Medical History:  Diagnosis Date  . Anal fissure   . Anemia 03/17/2011  . Anxiety   . Arthritis   . Breast cancer ([place])   . Bursitis of hip   . Chronic headaches   . Colon polyps   . Depression   . Diabetes mellitus    type 2   . Esophageal spasm   . Fibromyalgia   . Gallstones   . GERD (gastroesophageal reflux disease)   . Hyperlipidemia   . Hypertension   . Hypothyroidism   . IBS (irritable bowel syndrome)   . Neuropathy   . Obesity   . Osteopenia 07/2011   t score -1.5 FRAX 2.1%/0%  . PONV (postoperative nausea and vomiting)    none with the most recent surgeries   . Status post cardiac catheterization 2007   [place], [place] unsure which hospital it was ; reports it was ruled panic attack as the cause   . Vertigo    Occurs 6 times a month;  reports last occurrence was 04/2019    Surgeries: Procedure(s): LEFT TOTAL KNEE ARTHROPLASTY on 05/13/2019   Consultants:   Discharged Condition: Improved  Hospital Course: GLENOLA WHEAT is an 64 y.o. female who was admitted 05/13/2019 for operative treatment ofDegenerative arthritis of left knee. Patient has severe unremitting pain that affects sleep, daily activities, and work/hobbies. After pre-op clearance the patient was taken to the operating room on 05/13/2019 and underwent  Procedure(s): LEFT TOTAL KNEE ARTHROPLASTY.    Patient was given perioperative antibiotics:  Anti-infectives (From admission, onward)   Start     Dose/Rate Route Frequency Ordered Stop   05/13/19 1045  ceFAZolin (ANCEF) IVPB 2g/100 mL premix     2 g 200 mL/hr over 30 Minutes Intravenous On call to O.R. 05/13/19 1035 05/13/19 1315       Patient was  given sequential compression devices, early ambulation, and chemoprophylaxis to prevent DVT.  Patient benefited maximally from hospital stay and there were no complications.    Recent vital signs:  Patient Vitals for the past 24 hrs:  BP Temp Temp src Pulse Resp SpO2  05/15/19 0631 98/64 98.3 F (36.8 C) Oral 73 18 100 %  05/14/19 2049 113/73 98.2 F (36.8 C) Oral 68 18 100 %  05/14/19 1450 111/81 98.3 F (36.8 C) Oral 68 18 100 %     Recent laboratory studies:  Recent Labs    05/14/19 0418 05/15/19 0455  WBC 6.5 7.3  HGB 9.8* 9.4*  HCT 31.8* 30.4*  PLT 168 169  NA 137  --   K 4.8  --   CL 106  --   CO2 26  --   BUN 11  --   CREATININE 0.82  --   GLUCOSE 220*  --   CALCIUM 8.7*  --      Discharge Medications:   Allergies as of 05/15/2019      Reactions   Morphine And Related Itching   Blood-group Specific Substance    Refuses Blood   Codeine Nausea And Vomiting, Other (See Comments)   hallucinations   Enalapril    cough   Food    Sesame oil, chicken, corn, lactose, peanut, wheat   Oxycodone Nausea  And Vomiting, Other (See Comments)   hallucinations   Peanut-containing Drug Products    Simvastatin    Muscle pain      Medication List    STOP taking these medications   HYDROcodone-acetaminophen 10-325 MG tablet Commonly known as: NORCO     TAKE these medications   2-3CC SYRINGE 3 ML Misc 25 Syringes by Does not apply route every 30 (thirty) days.   Accu-Chek FastClix Lancets Misc Use daily as directed for Dx: 250.00   aspirin EC 81 MG tablet Take 1 tablet (81 mg total) by mouth 2 (two) times daily. What changed: when to take this   Cyanocobalamin 1000 MCG/ML Kit Inject 1,000 mcg as directed every 30 (thirty) days.   cyanocobalamin 1000 MCG/ML injection Commonly known as: (VITAMIN B-12) Inject 1,000 mcg into the muscle every 30 (thirty) days.   cyclobenzaprine 10 MG tablet Commonly known as: FLEXERIL Take 1 tablet (10 mg total) by mouth 3  (three) times daily as needed for muscle spasms.   diclofenac sodium 1 % Gel Commonly known as: VOLTAREN Apply 1 application topically 4 (four) times daily as needed (pain.).   dicyclomine 20 MG tablet Commonly known as: BENTYL Take 20 mg by mouth 3 (three) times daily as needed for spasms (abdominal pain).   fluticasone 50 MCG/ACT nasal spray Commonly known as: FLONASE Place 2 sprays into both nostrils 2 (two) times daily.   gabapentin 600 MG tablet Commonly known as: NEURONTIN Take 600 mg by mouth 5 (five) times daily.   hydrocortisone 2.5 % rectal cream Commonly known as: Proctosol HC Place 1 application rectally as needed. What changed:   when to take this  reasons to take this   HYDROmorphone 2 MG tablet Commonly known as: Dilaudid Take 1 tablet (2 mg total) by mouth every 4 (four) hours as needed for severe pain.   hydrOXYzine 25 MG tablet Commonly known as: ATARAX/VISTARIL Take 25 mg by mouth every 8 (eight) hours as needed for anxiety.   levothyroxine 25 MCG tablet Commonly known as: SYNTHROID Take 25 mcg by mouth at bedtime.   linaclotide 290 MCG Caps capsule Commonly known as: LINZESS Take 290 mcg by mouth daily.   meclizine 25 MG tablet Commonly known as: ANTIVERT Take 25 mg by mouth 3 (three) times daily as needed for dizziness.   naloxone 0.4 MG/ML injection Commonly known as: NARCAN Inject 0.4 mg into the skin as needed.   omeprazole 40 MG capsule Commonly known as: PRILOSEC Take 40 mg by mouth daily.   OZEMPIC (0.25 OR 0.5 MG/DOSE) [place] Inject 0.5 mg into the skin every Friday.   polyethylene glycol powder 17 GM/SCOOP powder Commonly known as: GLYCOLAX/MIRALAX Take 17 g by mouth 2 (two) times daily. What changed: how much to take   potassium chloride SA 20 MEQ tablet Commonly known as: KLOR-CON Take 1 tablet (20 mEq total) by mouth daily.   rosuvastatin 10 MG tablet Commonly known as: CRESTOR Take 10 mg by mouth at bedtime.   senna  8.6 MG tablet Commonly known as: SENOKOT Take 4 tablets by mouth 2 (two) times daily.   SYRINGE-NEEDLE (DISP) 3 ML 23G X 1" 3 ML Misc Commonly known as: Recruitment consultant Syringes 1 mL by Does not apply route every 30 (thirty) days. For B12 home administration   Vitamin D3 50 MCG (2000 UT) Tabs Take 2,000 Units by mouth daily.            Durable Medical Equipment  (From admission, onward)  Start     Ordered   05/13/19 2036  DME Walker rolling  Once    Question:  Patient needs a walker to treat with the following condition  Answer:  Status post total left knee replacement   05/13/19 2036   05/13/19 2036  DME 3 n 1  Once     05/13/19 2036           Discharge Care Instructions  (From admission, onward)         Start     Ordered   05/15/19 0000  Weight bearing as tolerated     05/15/19 0832   05/13/19 0000  Weight bearing as tolerated     05/13/19 1517          Diagnostic Studies: No results found.  Disposition: Discharge disposition: 01-Home or Self Care       Discharge Instructions    Call MD / Call 911   Complete by: As directed    If you experience chest pain or shortness of breath, CALL 911 and be transported to the hospital emergency room.  If you develope a fever above 101 F, pus (white drainage) or increased drainage or redness at the wound, or calf pain, call your surgeon's office.   Call MD / Call 911   Complete by: As directed    If you experience chest pain or shortness of breath, CALL 911 and be transported to the hospital emergency room.  If you develope a fever above 101 F, pus (white drainage) or increased drainage or redness at the wound, or calf pain, call your surgeon's office.   Constipation Prevention   Complete by: As directed    Drink plenty of fluids.  Prune juice may be helpful.  You may use a stool softener, such as Colace (over the counter) 100 mg twice a day.  Use MiraLax (over the counter) for constipation as needed.    Constipation Prevention   Complete by: As directed    Drink plenty of fluids.  Prune juice may be helpful.  You may use a stool softener, such as Colace (over the counter) 100 mg twice a day.  Use MiraLax (over the counter) for constipation as needed.   Diet - low sodium heart healthy   Complete by: As directed    Driving restrictions   Complete by: As directed    No driving for 2 weeks   Driving restrictions   Complete by: As directed    No driving for 2 weeks   Increase activity slowly as tolerated   Complete by: As directed    Increase activity slowly as tolerated   Complete by: As directed    Patient may shower   Complete by: As directed    You may shower without a dressing once there is no drainage.  Do not wash over the wound.  If drainage remains, cover wound with plastic wrap and then shower.   Patient may shower   Complete by: As directed    You may shower without a dressing once there is no drainage.  Do not wash over the wound.  If drainage remains, cover wound with plastic wrap and then shower.   Weight bearing as tolerated   Complete by: As directed    Weight bearing as tolerated   Complete by: As directed       Follow-up Information    Frederik Pear, MD. Go on 05/28/2019.   Specialty: Orthopedic Surgery Why: Your appointment is scheduled for  9:15.  Contact information: [place] [address] [phone]        [facility] Follow up.   Specialty: [place] Why: You will be seen at home by [place] prior to starting Outpatient physical therapy  Contact information: [address] [place] [address] [phone]        [facility], [place]. Go on 05/28/2019.   Why: You are scheduled to start Outpatient physical therapy at 10:40. please go over to the therapy desk after your MD appointment to complete your paperwork  Contact information: Physical Therapy [place]  [address] [phone]            Signed: Joanell Rising 05/15/2019, 8:32 AM

## 2019-05-15 NOTE — Progress Notes (Signed)
PATIENT ID: Kayla Mayo  MRN: [id number]  DOB/AGE:  64-25-1957 / 64 y.o.  2 Days Post-Op Procedure(s) (LRB): LEFT TOTAL KNEE ARTHROPLASTY (Left)    PROGRESS NOTE Subjective: Patient is alert, oriented, no Nausea, no Vomiting, yes passing gas. Taking PO well. Denies SOB, Chest or Calf Pain. Using Incentive Spirometer, PAS in place. Ambulate WBAT with pt up with therapy yesterday, Patient reports pain as moderate at rest.    Objective: Vital signs in last 24 hours: Vitals:   05/14/19 0507 05/14/19 1450 05/14/19 2049 05/15/19 0631  BP: 105/90 111/81 113/73 98/64  Pulse: 65 68 68 73  Resp: 17 18 18 18   Temp: 97.9 F (36.6 C) 98.3 F (36.8 C) 98.2 F (36.8 C) 98.3 F (36.8 C)  TempSrc: Oral Oral Oral Oral  SpO2: 100% 100% 100% 100%  Weight:      Height:          Intake/Output from previous day: I/O last 3 completed shifts: In: 2692.9 [P.O.:1500; I.V.:1192.9] Out: 2400 [Urine:2400]   Intake/Output this shift: No intake/output data recorded.   LABORATORY DATA: Recent Labs    05/14/19 0418 05/14/19 1143 05/14/19 1638 05/14/19 2229 05/15/19 0455  WBC 6.5  --   --   --  7.3  HGB 9.8*  --   --   --  9.4*  HCT 31.8*  --   --   --  30.4*  PLT 168  --   --   --  169  NA 137  --   --   --   --   K 4.8  --   --   --   --   CL 106  --   --   --   --   CO2 26  --   --   --   --   BUN 11  --   --   --   --   CREATININE 0.82  --   --   --   --   GLUCOSE 220*  --   --   --   --   GLUCAP  --  196* 203* 229*  --   CALCIUM 8.7*  --   --   --   --     Examination: Neurologically intact Neurovascular intact Sensation intact distally Intact pulses distally Dorsiflexion/Plantar flexion intact Incision: dressing C/D/I and no drainage No cellulitis present Compartment soft}  Assessment:   2 Days Post-Op Procedure(s) (LRB): LEFT TOTAL KNEE ARTHROPLASTY (Left) ADDITIONAL DIAGNOSIS: Expected Acute Blood Loss Anemia, Diabetes, Hypertension and vertigo, osteopenia, HLD,  fibromyalgia, breast cancer, and back surgery,  Anticipated LOS equal to or greater than 2 midnights due to - Age 64 and older with one or more of the following:  - Obesity  - Expected need for hospital services (PT, OT, Nursing) required for safe  discharge  - Anticipated need for postoperative skilled nursing care or inpatient rehab  - Active co-morbidities: Chronic pain requiring opiods and Diabetes OR   - Unanticipated findings during/Post Surgery: Slow post-op progression: GI, pain control, mobility     Plan: PT/OT WBAT, AROM and PROM  DVT Prophylaxis:  SCDx72hrs, ASA 81 mg BID x 2 weeks DISCHARGE PLAN: Home, later today once pt mets therapy goals DISCHARGE NEEDS: HHPT, Walker and 3-in-1 comode seat     Joanell Rising 05/15/2019, 8:28 AM

## 2019-05-15 NOTE — Progress Notes (Signed)
Physical Therapy Treatment Patient Details Name: Kayla Mayo MRN: [id number] DOB: 09-28-1955 Today's Date: 05/15/2019    History of Present Illness Patient is 64 y.o. female s/p L TKA with PMH significant for vertigo, osteopenia, HTN, HLD, fibromyalgia, breast cancer, DM, and back surgery, R TKA    PT Comments    Pt with much improved strength/quad control today although still unable to perform SLR without assist. KI adjusted to provide lateral as well as posterior support. Pt able to perform TKA exercises and amb short distance into hallway with RW and min to min-guard assist.  Significant other present for session. Pt has all DME at home including manual and motorized w/c.  Pt is ready to d/c from PT stand point. Discussed close supervision for safety as well as w/c follow until pt is stronger, pt is at risk for falls and reports a near fall after last knee surgery, was assisted by significant other.  Pt is anxious to d/c home   Follow Up Recommendations  Follow surgeon's recommendation for DC plan and follow-up therapies     Equipment Recommendations  None recommended by PT    Recommendations for Other Services       Precautions / Restrictions Precautions Precautions: Knee;Fall Required Braces or Orthoses: Knee Immobilizer - Left Restrictions Weight Bearing Restrictions: No Other Position/Activity Restrictions: WBAT    Mobility  Bed Mobility Overal bed mobility: Needs Assistance Bed Mobility: Supine to Sit     Supine to sit: Min assist     General bed mobility comments: assist with LLE, incr time d/t pain  Transfers Overall transfer level: Needs assistance Equipment used: Rolling walker (2 wheeled) Transfers: Sit to/from Stand Sit to Stand: Min assist         General transfer comment: cues for hand placement and LE position  Ambulation/Gait Ambulation/Gait assistance: Min assist;Min guard Gait Distance (Feet): 28 Feet Assistive device: Rolling walker (2  wheeled) Gait Pattern/deviations: Step-to pattern;Decreased step length - right;Decreased step length - left     General Gait Details: cues for sequence, wt shift, safety. min assist initial 15', then min/guard for safety   Stairs             Wheelchair Mobility    Modified Rankin (Stroke Patients Only)       Balance                                            Cognition Arousal/Alertness: Awake/alert Behavior During Therapy: WFL for tasks assessed/performed Overall Cognitive Status: Within Functional Limits for tasks assessed                                        Exercises Total Joint Exercises Ankle Circles/Pumps: AROM;Both;10 reps Quad Sets: AROM;Both;10 reps Heel Slides: AAROM;Left;10 reps Straight Leg Raises: AAROM;Left;5 reps;Limitations Straight Leg Raises Limitations: pain and weakness    General Comments        Pertinent Vitals/Pain Pain Assessment: 0-10 Pain Score: 5  Pain Location: L knee Pain Descriptors / Indicators: Grimacing;Sore Pain Intervention(s): Limited activity within patient's tolerance;Monitored during session;Repositioned;Premedicated before session    Home Living                      Prior Function  PT Goals (current goals can now be found in the care plan section) Acute Rehab PT Goals Patient Stated Goal: to go home  PT Goal Formulation: With patient Time For Goal Achievement: 05/20/19 Potential to Achieve Goals: Good Progress towards PT goals: Progressing toward goals    Frequency    7X/week      PT Plan Current plan remains appropriate    Co-evaluation              AM-PAC PT "6 Clicks" Mobility   Outcome Measure  Help needed turning from your back to your side while in a flat bed without using bedrails?: A Little Help needed moving from lying on your back to sitting on the side of a flat bed without using bedrails?: A Little Help needed moving to  and from a bed to a chair (including a wheelchair)?: A Little Help needed standing up from a chair using your arms (e.g., wheelchair or bedside chair)?: A Little Help needed to walk in hospital room?: A Little Help needed climbing 3-5 steps with a railing? : A Lot 6 Click Score: 17    End of Session Equipment Utilized During Treatment: Left knee immobilizer;Gait belt Activity Tolerance: Patient tolerated treatment well Patient left: with call bell/phone within reach;in chair;with chair alarm set   PT Visit Diagnosis: Difficulty in walking, not elsewhere classified (R26.2)     Time: [id number] PT Time Calculation (min) (ACUTE ONLY): 26 min  Charges:  $Gait Training: 23-37 mins                     Mearl Olver, PT   Acute Rehab Dept [facility]): [id number]   05/15/2019    [facility] 05/15/2019, 11:40 AM

## 2019-06-10 ENCOUNTER — Other Ambulatory Visit: Payer: Self-pay

## 2019-06-10 ENCOUNTER — Ambulatory Visit
Admission: RE | Admit: 2019-06-10 | Discharge: 2019-06-10 | Disposition: A | Discharge status: Home / Self Care | Payer: Medicare HMO | Source: Ambulatory Visit | Attending: Family Medicine | Admitting: Family Medicine

## 2019-06-10 DIAGNOSIS — Z9289 Personal history of other medical treatment: Secondary | ICD-10-CM

## 2019-06-12 ENCOUNTER — Other Ambulatory Visit: Payer: Self-pay

## 2019-06-13 ENCOUNTER — Encounter: Payer: Self-pay | Admitting: Obstetrics and Gynecology

## 2019-06-13 ENCOUNTER — Ambulatory Visit: Payer: Medicare HMO | Admitting: Obstetrics and Gynecology

## 2019-06-13 VITALS — BP 124/80 | Ht 65.5 in | Wt 187.0 lb

## 2019-06-13 DIAGNOSIS — Z01419 Encounter for gynecological examination (general) (routine) without abnormal findings: Secondary | ICD-10-CM | POA: Diagnosis not present

## 2019-06-13 DIAGNOSIS — Z124 Encounter for screening for malignant neoplasm of cervix: Secondary | ICD-10-CM

## 2019-06-13 DIAGNOSIS — Z780 Asymptomatic menopausal state: Secondary | ICD-10-CM

## 2019-06-13 NOTE — Progress Notes (Signed)
Kayla Mayo [facility] 1956/02/12 [id number]  SUBJECTIVE:  64 y.o. G2P2 female for annual routine gynecologic exam and Pap smear. She has no gynecologic concerns.  Current Outpatient Medications  Medication Sig Dispense Refill  . aspirin EC 81 MG tablet Take 1 tablet (81 mg total) by mouth 2 (two) times daily. 60 tablet 0  . Cholecalciferol (VITAMIN D3) 50 MCG (2000 UT) TABS Take 2,000 Units by mouth daily.    . cyanocobalamin (,VITAMIN B-12,) 1000 MCG/ML injection Inject 1,000 mcg into the muscle every 30 (thirty) days.    . Cyanocobalamin 1000 MCG/ML KIT Inject 1,000 mcg as directed every 30 (thirty) days. 1 kit 5  . cyclobenzaprine (FLEXERIL) 10 MG tablet Take 1 tablet (10 mg total) by mouth 3 (three) times daily as needed for muscle spasms. 30 tablet 0  . diclofenac sodium (VOLTAREN) 1 % GEL Apply 1 application topically 4 (four) times daily as needed (pain.).     Marland Kitchen dicyclomine (BENTYL) 20 MG tablet Take 20 mg by mouth 3 (three) times daily as needed for spasms (abdominal pain).    . fluticasone (FLONASE) 50 MCG/ACT nasal spray Place 2 sprays into both nostrils 2 (two) times daily.    Marland Kitchen gabapentin (NEURONTIN) 600 MG tablet Take 600 mg by mouth 5 (five) times daily.     . hydrocortisone (PROCTOSOL HC) 2.5 % rectal cream Place 1 application rectally as needed. (Patient taking differently: Place 1 application rectally 2 (two) times daily as needed (discomfort/hemorrhoids). ) 30 g 0  . hydrOXYzine (ATARAX/VISTARIL) 25 MG tablet Take 25 mg by mouth every 8 (eight) hours as needed for anxiety.     Marland Kitchen levothyroxine (SYNTHROID, LEVOTHROID) 25 MCG tablet Take 25 mcg by mouth at bedtime.     . Linaclotide (LINZESS) 290 MCG CAPS capsule Take 290 mcg by mouth daily.     . meclizine (ANTIVERT) 25 MG tablet Take 25 mg by mouth 3 (three) times daily as needed for dizziness.     Marland Kitchen omeprazole (PRILOSEC) 40 MG capsule Take 40 mg by mouth daily.    . polyethylene glycol powder (GLYCOLAX/MIRALAX) powder Take 17  g by mouth 2 (two) times daily. (Patient taking differently: Take 68 g by mouth 2 (two) times daily. ) 255 g 0  . potassium chloride SA (KLOR-CON) 20 MEQ tablet Take 1 tablet (20 mEq total) by mouth daily. 30 tablet 2  . rosuvastatin (CRESTOR) 10 MG tablet Take 10 mg by mouth at bedtime.     . Semaglutide (OZEMPIC, 0.25 OR 0.5 MG/DOSE, [place]) Inject 0.5 mg into the skin every Friday.    . senna (SENOKOT) 8.6 MG tablet Take 4 tablets by mouth 2 (two) times daily.    . Syringe, Disposable, (2-3CC SYRINGE) 3 ML MISC 25 Syringes by Does not apply route every 30 (thirty) days. 1 each 0  . SYRINGE-NEEDLE, DISP, 3 ML (LUER LOCK SAFETY SYRINGES) 23G X 1" 3 ML MISC 1 mL by Does not apply route every 30 (thirty) days. For B12 home administration 12 each 1  . ACCU-CHEK FASTCLIX LANCETS MISC Use daily as directed for Dx: 250.00    . naloxone (NARCAN) 0.4 MG/ML injection Inject 0.4 mg into the skin as needed.     No current facility-administered medications for this visit.   Facility-Administered Medications Ordered in Other Visits  Medication Dose Route Frequency Provider Last Rate Last Admin  . cyanocobalamin ((VITAMIN B-12)) injection 1,000 mcg  1,000 mcg Intramuscular Q28 days Marcy Panning, MD  Allergies: Morphine and related, Blood-group specific substance, Codeine, Enalapril, Food, Oxycodone, Peanut-containing drug products, and Simvastatin  Patient's last menstrual period was 05/02/2006.  Past medical history,surgical history, problem list, medications, allergies, family history and social history were all reviewed and documented as reviewed in the EPIC chart.  ROS:  Feeling well. No dyspnea or chest pain on exertion.  No abdominal pain, change in bowel habits, black or bloody stools.  No urinary tract symptoms. GYN ROS: no abnormal bleeding, pelvic pain or discharge, no breast pain or new or enlarging lumps on self exam. No neurological complaints.   OBJECTIVE:  BP 124/80   Ht 5' 5.5" (1.664  m)   Wt 187 lb (84.8 kg)   LMP 05/02/2006   BMI 30.65 kg/m  The patient appears well, alert, oriented x 3, in no distress. ENT normal.  Neck supple. No cervical or supraclavicular adenopathy or thyromegaly.  Lungs are clear, good air entry, no wheezes, rhonchi or rales. S1 and S2 normal, no murmurs, regular rate and rhythm.  Abdomen soft without tenderness, guarding, mass or organomegaly.  Neurological is normal, no focal findings.  BREAST EXAM: breasts appear normal, no suspicious masses, no skin or nipple changes or axillary nodes  PELVIC EXAM: VULVA: normal appearing vulva with no masses, tenderness or lesions, VAGINA: normal appearing vagina with normal color and discharge, no lesions, CERVIX: normal appearing cervix without discharge or lesions, UTERUS: uterus is normal size, shape, consistency and nontender, ADNEXA: normal adnexa in size, nontender and no masses, RECTAL: declined by the patient, PAP: Pap smear done today, thin-prep method  Chaperone: Caryn Bee present during the examination  ASSESSMENT:  64 y.o. G2P2 here for annual gynecologic exam  PLAN:   1. Postmenopausal. No concerns or significant symptoms. 2. Pap smear 10/2015. Pap smear is due so it is repeated today. No prior history of abnormal Pap smears.  3. Mammogram 06/12/2019 was normal.  Will continue with annual mammography. Breast exam normal today. 4. Colonoscopy 2012. Recommended that she continue per the prescribed interval.   5. Osteopenia. DEXA 07/2011 T score -1.5 distal third of the forearm. Normal at other measured sites.   Recommended repeat DEXA at her convenience in the upcoming months. 6. Health maintenance.  No lab work as she has this completed with her primary care provider.  She is managed for Diabetes mellitus and hyperlipidemia by them as well.  Return annually or sooner, prn.  Joseph Pierini MD  06/13/19

## 2019-06-13 NOTE — Patient Instructions (Addendum)
Please schedule a Bone density scan Your mammogram was normal

## 2019-06-17 LAB — PAP IG W/ RFLX HPV ASCU

## 2019-09-24 NOTE — Progress Notes (Signed)
[place]   Telephone:(336) [phone] Fax:(336) [phone]   Clinic Follow up Note   Patient Care Team: [name], MD as PCP - General (Family Medicine)  Date of Service:  09/26/2019  CHIEF COMPLAINT: F/u on anemia  CURRENT THERAPY:  -Monthly vitamin B12 injections, now at home  -IV Feraheme as needed, last in 10/2018  INTERVAL HISTORY:  Kayla Mayo is here for a follow up of anemia. She was last seen by me 1 year ago. She presents to the clinic alone. Since last visit she had 2 knee replacements, 1 in 03/2019 and the other in 05/2019. She notes she has scar tissue under her right meniscus so she has cracking with walking, not much pain. She does have pain in her left knee and it is improving. She ambulates with walking stick outside. She notes her energy level is fair. She notes she does not sleep well to wake up for her knee pain.     REVIEW OF SYSTEMS:   Constitutional: Denies fevers, chills or abnormal weight loss (+) Trouble sleeping  Eyes: Denies blurriness of vision Ears, nose, mouth, throat, and face: Denies mucositis or sore throat Respiratory: Denies cough, dyspnea or wheezes Cardiovascular: Denies palpitation, chest discomfort or lower extremity swelling Gastrointestinal:  Denies nausea, heartburn or change in bowel habits Skin: Denies abnormal skin rashes MSK: (+) Left knee pain  Lymphatics: Denies new lymphadenopathy or easy bruising Neurological:Denies numbness, tingling or new weaknesses Behavioral/Psych: Mood is stable, no new changes  All other systems were reviewed with the patient and are negative.  MEDICAL HISTORY:  Past Medical History:  Diagnosis Date  . Anal fissure   . Anemia 03/17/2011  . Anxiety   . Arthritis   . Breast cancer ([place])   . Bursitis of hip   . Chronic headaches   . Colon polyps   . Depression   . Diabetes mellitus    type 2   . Esophageal spasm   . Fibromyalgia   . Gallstones   . GERD (gastroesophageal  reflux disease)   . Hyperlipidemia   . Hypertension   . Hypothyroidism   . IBS (irritable bowel syndrome)   . Neuropathy   . Obesity   . Osteopenia 07/2011   t score -1.5 FRAX 2.1%/0%  . PONV (postoperative nausea and vomiting)    none with the most recent surgeries   . Status post cardiac catheterization 2007   [place], [place] unsure which hospital it was ; reports it was ruled panic attack as the cause   . Vertigo    Occurs 6 times a month;  reports last occurrence was 04/2019    SURGICAL HISTORY: Past Surgical History:  Procedure Laterality Date  . APPENDECTOMY    . bilareral knee scopes    . CARPAL TUNNEL RELEASE    . CHOLECYSTECTOMY    . GASTRIC BYPASS    . left shouler surgery    . lower back surgery    . TOTAL KNEE ARTHROPLASTY Right 03/04/2019   Procedure: [place] TOTAL KNEE ARTHROPLASTY;  Surgeon: Frederik Pear, MD;  Location: [facility];  Service: Orthopedics;  Laterality: Right;  . TOTAL KNEE ARTHROPLASTY Left 05/13/2019   Procedure: LEFT TOTAL KNEE ARTHROPLASTY;  Surgeon: Frederik Pear, MD;  Location: [facility];  Service: Orthopedics;  Laterality: Left;  . TUBAL LIGATION      I have reviewed the social history and family history with the patient and they are unchanged from previous note.  ALLERGIES:  is allergic  to morphine and related; blood-group specific substance; codeine; enalapril; food; oxycodone; peanut-containing drug products; and simvastatin.  MEDICATIONS:  Current Outpatient Medications  Medication Sig Dispense Refill  . ACCU-CHEK FASTCLIX LANCETS MISC Use daily as directed for Dx: 250.00    . aspirin EC 81 MG tablet Take 1 tablet (81 mg total) by mouth 2 (two) times daily. 60 tablet 0  . CELEBREX 200 MG capsule Take 200 mg by mouth 2 (two) times daily.    . cetirizine (QUZYTTIR) 10 MG/ML injection 1 tablet    . Cholecalciferol (VITAMIN D3) 50 MCG (2000 UT) TABS Take 2,000 Units by mouth daily.    . cyanocobalamin (,VITAMIN B-12,) 1000 MCG/ML injection Inject  1,000 mcg into the muscle every 30 (thirty) days.    . Cyanocobalamin 1000 MCG/ML KIT Inject 1,000 mcg as directed every 30 (thirty) days. 1 kit 5  . cyclobenzaprine (FLEXERIL) 10 MG tablet Take 1 tablet (10 mg total) by mouth 3 (three) times daily as needed for muscle spasms. 30 tablet 0  . diclofenac sodium (VOLTAREN) 1 % GEL Apply 1 application topically 4 (four) times daily as needed (pain.).     Marland Kitchen dicyclomine (BENTYL) 20 MG tablet Take 20 mg by mouth 3 (three) times daily as needed for spasms (abdominal pain).    . fluticasone (FLONASE) 50 MCG/ACT nasal spray Place 2 sprays into both nostrils 2 (two) times daily.    Marland Kitchen gabapentin (NEURONTIN) 600 MG tablet Take 600 mg by mouth 5 (five) times daily.     Marland Kitchen HYDROcodone-Acetaminophen 10-325 MG/15ML SOLN 1 tablet as needed    . hydrocortisone (PROCTOSOL HC) 2.5 % rectal cream Place 1 application rectally as needed. (Patient taking differently: Place 1 application rectally 2 (two) times daily as needed (discomfort/hemorrhoids). ) 30 g 0  . hydrOXYzine (ATARAX/VISTARIL) 25 MG tablet Take 25 mg by mouth every 8 (eight) hours as needed for anxiety.     Marland Kitchen levothyroxine (SYNTHROID, LEVOTHROID) 25 MCG tablet Take 25 mcg by mouth at bedtime.     . Linaclotide (LINZESS) 290 MCG CAPS capsule Take 290 mcg by mouth daily.     . meclizine (ANTIVERT) 25 MG tablet Take 25 mg by mouth 3 (three) times daily as needed for dizziness.     . naloxone (NARCAN) 0.4 MG/ML injection Inject 0.4 mg into the skin as needed.    Marland Kitchen omeprazole (PRILOSEC) 40 MG capsule Take 40 mg by mouth daily.    . polyethylene glycol powder (GLYCOLAX/MIRALAX) powder Take 17 g by mouth 2 (two) times daily. (Patient taking differently: Take 68 g by mouth 2 (two) times daily. ) 255 g 0  . potassium chloride SA (KLOR-CON) 20 MEQ tablet Take 1 tablet (20 mEq total) by mouth daily. 30 tablet 2  . rosuvastatin (CRESTOR) 10 MG tablet Take 10 mg by mouth at bedtime.     . Semaglutide (OZEMPIC, 0.25 OR 0.5  MG/DOSE, [place]) Inject 0.5 mg into the skin every Friday.    . senna (SENOKOT) 8.6 MG tablet Take 4 tablets by mouth 2 (two) times daily.    . Syringe, Disposable, (2-3CC SYRINGE) 3 ML MISC 25 Syringes by Does not apply route every 30 (thirty) days. 1 each 0  . SYRINGE-NEEDLE, DISP, 3 ML (LUER LOCK SAFETY SYRINGES) 23G X 1" 3 ML MISC 1 mL by Does not apply route every 30 (thirty) days. For B12 home administration 12 each 1   No current facility-administered medications for this visit.   Facility-Administered Medications Ordered in Other  Visits  Medication Dose Route Frequency Provider Last Rate Last Admin  . cyanocobalamin ((VITAMIN B-12)) injection 1,000 mcg  1,000 mcg Intramuscular Q28 days Marcy Panning, MD        PHYSICAL EXAMINATION: ECOG PERFORMANCE STATUS: 1 - Symptomatic but completely ambulatory  Vitals:   09/26/19 1124  BP: (!) 142/88  Pulse: 81  Resp: 17  Temp: (!) 97.3 F (36.3 C)  SpO2: 100%   Filed Weights   09/26/19 1124  Weight: 185 lb 8 oz (84.1 kg)    Due to COVID19 we will limit examination to appearance. Patient had no complaints.  GENERAL:alert, no distress and comfortable SKIN: skin color normal, no rashes or significant lesions EYES: normal, Conjunctiva are pink and non-injected, sclera clear  NEURO: alert & oriented x 3 with fluent speech   LABORATORY DATA:  I have reviewed the data as listed CBC Latest Ref Rng & Units 09/26/2019 05/15/2019 05/14/2019  WBC 4.0 - 10.5 K/uL 4.2 7.3 6.5  Hemoglobin 12.0 - 15.0 g/dL 12.6 9.4(L) 9.8(L)  Hematocrit 36.0 - 46.0 % 39.5 30.4(L) 31.8(L)  Platelets 150 - 400 K/uL 198 169 168     CMP Latest Ref Rng & Units 09/26/2019 05/14/2019 05/08/2019  Glucose 70 - 99 mg/dL 220(H) 220(H) 153(H)  BUN 8 - 23 mg/dL '16 11 11  '$ Creatinine 0.44 - 1.00 mg/dL 0.92 0.82 0.91  Sodium 135 - 145 mmol/L 141 137 141  Potassium 3.5 - 5.1 mmol/L 3.9 4.8 4.4  Chloride 98 - 111 mmol/L 105 106 102  CO2 22 - 32 mmol/L '27 26 30  '$ Calcium 8.9 -  10.3 mg/dL 9.1 8.7(L) 9.2  Total Protein 6.5 - 8.1 g/dL 7.2 - -  Total Bilirubin 0.3 - 1.2 mg/dL 0.6 - -  Alkaline Phos 38 - 126 U/L 99 - -  AST 15 - 41 U/L 25 - -  ALT 0 - 44 U/L 17 - -      RADIOGRAPHIC STUDIES: I have personally reviewed the radiological images as listed and agreed with the findings in the report. No results found.   ASSESSMENT & PLAN:  Roselie Cirigliano is a 64 y.o. female with   1. 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 Last IV iron infusion was in 04/2015.    -She is not on iron pills now due to low absorption.  -She is clinically doing well. Labs reviewed, Anemia currently resolved with normal retic panel. Iron panel and B12 and Vit D still pending..  -She will continue Vitamin B12 injections at home.   -Continue labs with her other physicians or in our clinic in 6 months. May give IV Feraheme if Ferritin <50. -F/u in 1 year. I encouraged her to contact clinic for any concerns or significant fatigue.    2. Arthritis, DM with neuropathy,depression,insomnia, fatigue -f/u with PCP -Continue medications  -She has b/l knee replacements in the past year, 03/2019 and 05/2019. She is recovering fairly but has residual left knee pain and swelling. She ambulated with walking stick outside. She notes this impacts her sleep which impacts her energy.   3. Hypokalemia and hypocalcemia  -likely related to lasix  -I called in KCL 80mq bid for 7 days then once daily -repeat lab in 2 months -I encourage her to take OTC calcium    Plan -continue B12 injections monthly at home  -Lab in 6 and 12 months, will  consider iv iron if ferritin<50 -ferritin 56 today with normal serum iron and TIBC, no iv iron this time  -F/u in 12 months   No problem-specific Assessment & Plan notes found for this encounter.   No orders of the defined  types were placed in this encounter.  All questions were answered. The patient knows to call the clinic with any problems, questions or concerns. No barriers to learning was detected. The total time spent in the appointment was 20 minutes.     Truitt Merle, MD 09/26/2019   I, Joslyn Devon, am acting as scribe for Truitt Merle, MD.   I have reviewed the above documentation for accuracy and completeness, and I agree with the above.

## 2019-09-26 ENCOUNTER — Inpatient Hospital Stay: Payer: Medicare HMO | Admitting: Hematology

## 2019-09-26 ENCOUNTER — Inpatient Hospital Stay: Payer: Medicare HMO | Attending: Hematology

## 2019-09-26 ENCOUNTER — Other Ambulatory Visit: Payer: Self-pay

## 2019-09-26 ENCOUNTER — Encounter: Payer: Self-pay | Admitting: Hematology

## 2019-09-26 VITALS — BP 142/88 | HR 81 | Temp 97.3°F | Resp 17 | Ht 65.5 in | Wt 185.5 lb

## 2019-09-26 DIAGNOSIS — E1140 Type 2 diabetes mellitus with diabetic neuropathy, unspecified: Secondary | ICD-10-CM | POA: Insufficient documentation

## 2019-09-26 DIAGNOSIS — F419 Anxiety disorder, unspecified: Secondary | ICD-10-CM | POA: Diagnosis not present

## 2019-09-26 DIAGNOSIS — F329 Major depressive disorder, single episode, unspecified: Secondary | ICD-10-CM | POA: Diagnosis not present

## 2019-09-26 DIAGNOSIS — D6489 Other specified anemias: Secondary | ICD-10-CM

## 2019-09-26 DIAGNOSIS — D513 Other dietary vitamin B12 deficiency anemia: Secondary | ICD-10-CM | POA: Insufficient documentation

## 2019-09-26 DIAGNOSIS — G4700 Insomnia, unspecified: Secondary | ICD-10-CM | POA: Insufficient documentation

## 2019-09-26 DIAGNOSIS — Z96653 Presence of artificial knee joint, bilateral: Secondary | ICD-10-CM | POA: Insufficient documentation

## 2019-09-26 DIAGNOSIS — D509 Iron deficiency anemia, unspecified: Secondary | ICD-10-CM

## 2019-09-26 DIAGNOSIS — Z9884 Bariatric surgery status: Secondary | ICD-10-CM | POA: Insufficient documentation

## 2019-09-26 DIAGNOSIS — E876 Hypokalemia: Secondary | ICD-10-CM | POA: Insufficient documentation

## 2019-09-26 DIAGNOSIS — D518 Other vitamin B12 deficiency anemias: Secondary | ICD-10-CM

## 2019-09-26 DIAGNOSIS — Z853 Personal history of malignant neoplasm of breast: Secondary | ICD-10-CM | POA: Insufficient documentation

## 2019-09-26 DIAGNOSIS — R5383 Other fatigue: Secondary | ICD-10-CM | POA: Diagnosis not present

## 2019-09-26 DIAGNOSIS — D510 Vitamin B12 deficiency anemia due to intrinsic factor deficiency: Secondary | ICD-10-CM

## 2019-09-26 DIAGNOSIS — D508 Other iron deficiency anemias: Secondary | ICD-10-CM | POA: Diagnosis not present

## 2019-09-26 DIAGNOSIS — E559 Vitamin D deficiency, unspecified: Secondary | ICD-10-CM

## 2019-09-26 DIAGNOSIS — M1990 Unspecified osteoarthritis, unspecified site: Secondary | ICD-10-CM | POA: Diagnosis not present

## 2019-09-26 LAB — CBC WITH DIFFERENTIAL (CANCER CENTER ONLY)
Abs Immature Granulocytes: 0 10*3/uL (ref 0.00–0.07)
Basophils Absolute: 0 10*3/uL (ref 0.0–0.1)
Basophils Relative: 1 %
Eosinophils Absolute: 0.1 10*3/uL (ref 0.0–0.5)
Eosinophils Relative: 2 %
HCT: 39.5 % (ref 36.0–46.0)
Hemoglobin: 12.6 g/dL (ref 12.0–15.0)
Immature Granulocytes: 0 %
Lymphocytes Relative: 32 %
Lymphs Abs: 1.3 10*3/uL (ref 0.7–4.0)
MCH: 30.5 pg (ref 26.0–34.0)
MCHC: 31.9 g/dL (ref 30.0–36.0)
MCV: 95.6 fL (ref 80.0–100.0)
Monocytes Absolute: 0.3 10*3/uL (ref 0.1–1.0)
Monocytes Relative: 6 %
Neutro Abs: 2.5 10*3/uL (ref 1.7–7.7)
Neutrophils Relative %: 59 %
Platelet Count: 198 10*3/uL (ref 150–400)
RBC: 4.13 MIL/uL (ref 3.87–5.11)
RDW: 13.2 % (ref 11.5–15.5)
WBC Count: 4.2 10*3/uL (ref 4.0–10.5)
nRBC: 0 % (ref 0.0–0.2)

## 2019-09-26 LAB — CMP (CANCER CENTER ONLY)
ALT: 17 U/L (ref 0–44)
AST: 25 U/L (ref 15–41)
Albumin: 4.2 g/dL (ref 3.5–5.0)
Alkaline Phosphatase: 99 U/L (ref 38–126)
Anion gap: 9 (ref 5–15)
BUN: 16 mg/dL (ref 8–23)
CO2: 27 mmol/L (ref 22–32)
Calcium: 9.1 mg/dL (ref 8.9–10.3)
Chloride: 105 mmol/L (ref 98–111)
Creatinine: 0.92 mg/dL (ref 0.44–1.00)
GFR, Est AFR Am: >60 mL/min (ref >60)
GFR, Estimated: >60 mL/min (ref >60)
Glucose, Bld: 220 mg/dL — ABNORMAL HIGH (ref 70–99)
Potassium: 3.9 mmol/L (ref 3.5–5.1)
Sodium: 141 mmol/L (ref 135–145)
Total Bilirubin: 0.6 mg/dL (ref 0.3–1.2)
Total Protein: 7.2 g/dL (ref 6.5–8.1)

## 2019-09-26 LAB — RETICULOCYTES
Immature Retic Fract: 6.1 % (ref 2.3–15.9)
RBC.: 4.14 MIL/uL (ref 3.87–5.11)
Retic Count, Absolute: 58.8 10*3/uL (ref 19.0–186.0)
Retic Ct Pct: 1.4 % (ref 0.4–3.1)

## 2019-09-26 LAB — VITAMIN D 25 HYDROXY (VIT D DEFICIENCY, FRACTURES): Vit D, 25-Hydroxy: 34.61 ng/mL (ref 30–100)

## 2019-09-26 LAB — VITAMIN B12: Vitamin B-12: 947 pg/mL — ABNORMAL HIGH (ref 180–914)

## 2019-09-27 ENCOUNTER — Encounter: Payer: Self-pay | Admitting: Hematology

## 2019-09-27 ENCOUNTER — Telehealth: Payer: Self-pay | Admitting: Hematology

## 2019-09-27 LAB — IRON AND TIBC
Iron: 96 ug/dL (ref 28–170)
Saturation Ratios: 30 % (ref 10.4–31.8)
TIBC: 325 ug/dL (ref 250–450)
UIBC: 229 ug/dL

## 2019-09-27 LAB — FERRITIN: Ferritin: 56 ng/mL (ref 11–307)

## 2019-09-27 NOTE — Telephone Encounter (Signed)
Scheduled appt per 5/27 los.  Left a vm of the appt date and time.

## 2019-09-28 ENCOUNTER — Encounter: Payer: Self-pay | Admitting: Hematology

## 2019-10-01 ENCOUNTER — Other Ambulatory Visit: Payer: Self-pay

## 2019-10-01 DIAGNOSIS — D519 Vitamin B12 deficiency anemia, unspecified: Secondary | ICD-10-CM

## 2019-10-01 MED ORDER — CYANOCOBALAMIN 1000 MCG/ML IJ KIT: 1000.0000 ug | PACK | INTRAMUSCULAR | 5 refills | Status: DC

## 2019-11-18 ENCOUNTER — Encounter: Payer: Self-pay | Admitting: Hematology

## 2019-12-04 ENCOUNTER — Other Ambulatory Visit: Payer: Self-pay | Admitting: Hematology

## 2020-01-15 ENCOUNTER — Telehealth: Payer: Self-pay

## 2020-01-15 DIAGNOSIS — D519 Vitamin B12 deficiency anemia, unspecified: Secondary | ICD-10-CM

## 2020-01-15 MED ORDER — CYANOCOBALAMIN 1000 MCG/ML IJ KIT: 1000.0000 ug | PACK | INTRAMUSCULAR | 12 refills | Status: DC

## 2020-01-15 MED ORDER — "SAFETY INSULIN SYRINGES 27G X 1/2"" 1 ML MISC": 1.0000 | 12 refills | Status: DC

## 2020-01-15 NOTE — Telephone Encounter (Signed)
Kayla Mayo called stating she needs a refill for the B12 and 1 ml syringes.  She needs that rx sent to [place] mail delivery.

## 2020-01-16 ENCOUNTER — Other Ambulatory Visit: Payer: Self-pay

## 2020-01-16 DIAGNOSIS — D519 Vitamin B12 deficiency anemia, unspecified: Secondary | ICD-10-CM

## 2020-01-16 MED ORDER — CYANOCOBALAMIN 1000 MCG/ML IJ KIT: 1000.0000 ug | PACK | INTRAMUSCULAR | 4 refills | Status: DC

## 2020-01-16 MED ORDER — "SAFETY INSULIN SYRINGES 27G X 1/2"" 1 ML MISC": 1.0000 | 4 refills | Status: DC

## 2020-01-20 ENCOUNTER — Other Ambulatory Visit: Payer: Self-pay | Admitting: Hematology

## 2020-01-21 ENCOUNTER — Other Ambulatory Visit: Payer: Self-pay

## 2020-01-21 DIAGNOSIS — D519 Vitamin B12 deficiency anemia, unspecified: Secondary | ICD-10-CM

## 2020-01-21 MED ORDER — "SYRINGE 25G X 1"" 3 ML MISC": 1.0000 mL | 3 refills | Status: DC

## 2020-01-21 NOTE — Progress Notes (Signed)
error 

## 2020-03-30 ENCOUNTER — Inpatient Hospital Stay: Payer: Medicare HMO

## 2020-04-14 ENCOUNTER — Encounter: Payer: Self-pay | Admitting: Hematology

## 2020-05-24 ENCOUNTER — Encounter: Payer: Self-pay | Admitting: Hematology

## 2020-05-26 ENCOUNTER — Other Ambulatory Visit: Payer: Self-pay

## 2020-05-26 DIAGNOSIS — D510 Vitamin B12 deficiency anemia due to intrinsic factor deficiency: Secondary | ICD-10-CM

## 2020-05-26 MED ORDER — "SYRINGE/NEEDLE (DISP) 25G X 1"" 1 ML MISC": 1.0000 mL | 3 refills | Status: DC

## 2020-07-09 ENCOUNTER — Other Ambulatory Visit: Payer: Self-pay | Admitting: Family Medicine

## 2020-07-09 DIAGNOSIS — Z1231 Encounter for screening mammogram for malignant neoplasm of breast: Secondary | ICD-10-CM

## 2020-07-13 ENCOUNTER — Other Ambulatory Visit: Payer: Self-pay

## 2020-07-13 ENCOUNTER — Ambulatory Visit
Admission: RE | Admit: 2020-07-13 | Discharge: 2020-07-13 | Disposition: A | Discharge status: Home / Self Care | Payer: Medicare HMO | Source: Ambulatory Visit | Attending: Family Medicine | Admitting: Family Medicine

## 2020-07-13 DIAGNOSIS — Z1231 Encounter for screening mammogram for malignant neoplasm of breast: Secondary | ICD-10-CM

## 2020-09-21 NOTE — Progress Notes (Signed)
[place]   [phone] [phone]   Clinic Follow up Note   Patient Care Team: [name], MD as PCP - General (Family Medicine)  Date of Service:  09/25/2020  CHIEF COMPLAINT: F/u on anemia  CURRENT THERAPY:  -MonthlyvitaminB12 injections, now at home  -IV Feraheme if Ferritin <50 from 09/23/11. Has not needed since 10/05/18.   INTERVAL HISTORY:  Kayla Mayo is here for a follow up of anemia. She was last seen by me 1 year ago. She presents to the clinic alone. She notes she is doing well. She denies any new major changes. She notes she always feels tired which she attributes to her medications. She denies SOB or chest pain. She continues B12 injections monthly at home. She is not on oral iron. She notes she did drink Glucerna this morning. She notes before Glucerna her BG was 103. I reviewed her medication list with her.   REVIEW OF SYSTEMS:   Constitutional: Denies fevers, chills or abnormal weight loss Eyes: Denies blurriness of vision Ears, nose, mouth, throat, and face: Denies mucositis or sore throat Respiratory: Denies cough, dyspnea or wheezes Cardiovascular: Denies palpitation, chest discomfort or lower extremity swelling Gastrointestinal:  Denies nausea, heartburn or change in bowel habits Skin: Denies abnormal skin rashes Lymphatics: Denies new lymphadenopathy or easy bruising Neurological:Denies numbness, tingling or new weaknesses Behavioral/Psych: Mood is stable, no new changes  All other systems were reviewed with the patient and are negative.  MEDICAL HISTORY:  Past Medical History:  Diagnosis Date  . Anal fissure   . Anemia 03/17/2011  . Anxiety   . Arthritis   . Bursitis of hip   . Chronic headaches   . Colon polyps   . Depression   . Diabetes mellitus    type 2   . Esophageal spasm   . Fibromyalgia   . Gallstones   . GERD (gastroesophageal reflux disease)   . Hyperlipidemia   . Hypertension   .  Hypothyroidism   . IBS (irritable bowel syndrome)   . Neuropathy   . Obesity   . Osteopenia 07/2011   t score -1.5 FRAX 2.1%/0%  . PONV (postoperative nausea and vomiting)    none with the most recent surgeries   . Status post cardiac catheterization 2007   [place], [place] unsure which hospital it was ; reports it was ruled panic attack as the cause   . Vertigo    Occurs 6 times a month;  reports last occurrence was 04/2019    SURGICAL HISTORY: Past Surgical History:  Procedure Laterality Date  . APPENDECTOMY    . bilareral knee scopes    . CARPAL TUNNEL RELEASE    . CHOLECYSTECTOMY    . GASTRIC BYPASS    . left shouler surgery    . lower back surgery    . TOTAL KNEE ARTHROPLASTY Right 03/04/2019   Procedure: [place] TOTAL KNEE ARTHROPLASTY;  Surgeon: Frederik Pear, MD;  Location: [facility];  Service: Orthopedics;  Laterality: Right;  . TOTAL KNEE ARTHROPLASTY Left 05/13/2019   Procedure: LEFT TOTAL KNEE ARTHROPLASTY;  Surgeon: Frederik Pear, MD;  Location: [facility];  Service: Orthopedics;  Laterality: Left;  . TUBAL LIGATION      I have reviewed the social history and family history with the patient and they are unchanged from previous note.  ALLERGIES:  is allergic to morphine and related, blood-group specific substance, codeine, enalapril, food, oxycodone, peanut-containing drug products, and simvastatin.  MEDICATIONS:  Current Outpatient Medications  Medication Sig Dispense Refill  . ACCU-CHEK FASTCLIX LANCETS MISC Use daily as directed for Dx: 250.00    . CELEBREX 200 MG capsule Take 200 mg by mouth 2 (two) times daily.    . Cyanocobalamin 1000 MCG/ML KIT Inject 1,000 mcg as directed every 30 (thirty) days. 3 kit 4  . cyclobenzaprine (FLEXERIL) 10 MG tablet Take 1 tablet (10 mg total) by mouth 3 (three) times daily as needed for muscle spasms. 30 tablet 0  . diclofenac sodium (VOLTAREN) 1 % GEL Apply 1 application topically 4 (four) times daily as needed (pain.).     Marland Kitchen dicyclomine  (BENTYL) 20 MG tablet Take 20 mg by mouth 3 (three) times daily as needed for spasms (abdominal pain).    . fluticasone (FLONASE) 50 MCG/ACT nasal spray Place 2 sprays into both nostrils 2 (two) times daily.    Marland Kitchen gabapentin (NEURONTIN) 600 MG tablet Take 600 mg by mouth 5 (five) times daily.     Marland Kitchen HYDROcodone-Acetaminophen 10-325 MG/15ML SOLN 1 tablet as needed    . hydrocortisone (PROCTOSOL HC) 2.5 % rectal cream Place 1 application rectally as needed. (Patient taking differently: Place 1 application rectally 2 (two) times daily as needed (discomfort/hemorrhoids). ) 30 g 0  . hydrOXYzine (ATARAX/VISTARIL) 25 MG tablet Take 25 mg by mouth every 8 (eight) hours as needed for anxiety.     Marland Kitchen levothyroxine (SYNTHROID, LEVOTHROID) 25 MCG tablet Take 25 mcg by mouth at bedtime.     . Linaclotide (LINZESS) 290 MCG CAPS capsule Take 290 mcg by mouth daily.     . meclizine (ANTIVERT) 25 MG tablet Take 25 mg by mouth 3 (three) times daily as needed for dizziness.     . naloxone (NARCAN) 0.4 MG/ML injection Inject 0.4 mg into the skin as needed.    Marland Kitchen omeprazole (PRILOSEC) 40 MG capsule Take 40 mg by mouth daily.    . polyethylene glycol powder (GLYCOLAX/MIRALAX) powder Take 17 g by mouth 2 (two) times daily. (Patient taking differently: Take 68 g by mouth 2 (two) times daily. ) 255 g 0  . potassium chloride SA (KLOR-CON) 20 MEQ tablet Take 1 tablet (20 mEq total) by mouth daily. 30 tablet 2  . rosuvastatin (CRESTOR) 10 MG tablet Take 10 mg by mouth at bedtime.     . Semaglutide (OZEMPIC, 0.25 OR 0.5 MG/DOSE, [place]) Inject 0.5 mg into the skin every Friday.    . senna (SENOKOT) 8.6 MG tablet Take 4 tablets by mouth 2 (two) times daily.    . Syringe/Needle, Disp, 25G X 1" 1 ML MISC 1 mL by Does not apply route every 30 (thirty) days. 3 each 3   No current facility-administered medications for this visit.   Facility-Administered Medications Ordered in Other Visits  Medication Dose Route Frequency Provider Last  Rate Last Admin  . cyanocobalamin ((VITAMIN B-12)) injection 1,000 mcg  1,000 mcg Intramuscular Q28 days Marcy Panning, MD        PHYSICAL EXAMINATION: ECOG PERFORMANCE STATUS: 1 - Symptomatic but completely ambulatory  Vitals:   09/25/20 1050  BP: 137/86  Pulse: 87  Resp: 19  Temp: (!) 97 F (36.1 C)  SpO2: 98%   Filed Weights   09/25/20 1050  Weight: 189 lb 9.6 oz (86 kg)    Due to COVID19 we will limit examination to appearance. Patient had no complaints.  GENERAL:alert, no distress and comfortable SKIN: skin color normal, no rashes or significant lesions EYES: normal, Conjunctiva are pink and non-injected, sclera clear  NEURO: alert & oriented x 3 with fluent speech   LABORATORY DATA:  I have reviewed the data as listed CBC Latest Ref Rng & Units 09/25/2020 09/26/2019 05/15/2019  WBC 4.0 - 10.5 K/uL 4.9 4.2 7.3  Hemoglobin 12.0 - 15.0 g/dL 12.7 12.6 9.4(L)  Hematocrit 36.0 - 46.0 % 39.4 39.5 30.4(L)  Platelets 150 - 400 K/uL 252 198 169     CMP Latest Ref Rng & Units 09/25/2020 09/26/2019 05/14/2019  Glucose 70 - 99 mg/dL 174(H) 220(H) 220(H)  BUN 8 - 23 mg/dL $Remove'14 16 11  'OiAYCVI$ Creatinine 0.44 - 1.00 mg/dL 1.09(H) 0.92 0.82  Sodium 135 - 145 mmol/L 141 141 137  Potassium 3.5 - 5.1 mmol/L 4.0 3.9 4.8  Chloride 98 - 111 mmol/L 106 105 106  CO2 22 - 32 mmol/L $RemoveB'25 27 26  'AVHMZDZm$ Calcium 8.9 - 10.3 mg/dL 9.6 9.1 8.7(L)  Total Protein 6.5 - 8.1 g/dL 7.3 7.2 -  Total Bilirubin 0.3 - 1.2 mg/dL 0.8 0.6 -  Alkaline Phos 38 - 126 U/L 132(H) 99 -  AST 15 - 41 U/L 19 25 -  ALT 0 - 44 U/L 11 17 -      RADIOGRAPHIC STUDIES: I have personally reviewed the radiological images as listed and agreed with the findings in the report. No results found.   ASSESSMENT & PLAN:  Kayla Mayo is a 66 y.o. female with   1. 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 Last IV  iron infusion was in 10/2018. She is not on iron pills nowdue to low absorption.  -From an anemia standpoint, she is clinically doing well. Pt notes her fatigue is related to her many medications. Labs reviewed, Hg normal range at 12.7, BG 174, Cr 1.09, alk Phos 132. Iron panel and B12 and Vit D still pending.  -Given her anemia is resolved and she has not need iv iron for over 2 years, she can monitor this with her PCP and return as needed for IV Iron if needed. She is agreeable.  -She will continue Vitamin B12 injections at home. May give IV Feraheme if Ferritin <50.   2. Arthritis, DMwith neuropathy,depression,insomnia, fatigue -f/u with PCP -Continue medications -She has b/l knee replacements in the past year, 03/2019 and 05/2019. She is recovering fairly but has residual left knee pain and swelling. She ambulated with walking stick outside.  -She attributes her fatigue to her many medications and Comorbidities.    Plan -continue B12 injectionsmonthly athome -f/u with PCP with CBC and iron study every 6 months. I will see her back as needed    No problem-specific Assessment & Plan notes found for this encounter.   No orders of the defined types were placed in this encounter.  All questions were answered. The patient knows to call the clinic with any problems, questions or concerns. No barriers to learning was detected. The total time spent in the appointment was 20 minutes.     Truitt Merle, MD 09/25/2020   I, Joslyn Devon, am acting as scribe for Truitt Merle, MD.   I have reviewed the above documentation for accuracy and completeness, and I agree with the above.

## 2020-09-24 ENCOUNTER — Other Ambulatory Visit: Payer: Self-pay

## 2020-09-24 DIAGNOSIS — D509 Iron deficiency anemia, unspecified: Secondary | ICD-10-CM

## 2020-09-24 DIAGNOSIS — D510 Vitamin B12 deficiency anemia due to intrinsic factor deficiency: Secondary | ICD-10-CM

## 2020-09-25 ENCOUNTER — Encounter: Payer: Self-pay | Admitting: Hematology

## 2020-09-25 ENCOUNTER — Inpatient Hospital Stay: Payer: Medicare HMO | Attending: Hematology | Admitting: Hematology

## 2020-09-25 ENCOUNTER — Other Ambulatory Visit: Payer: Self-pay

## 2020-09-25 ENCOUNTER — Inpatient Hospital Stay: Payer: Medicare HMO

## 2020-09-25 VITALS — BP 137/86 | HR 87 | Temp 97.0°F | Resp 19 | Ht 65.5 in | Wt 189.6 lb

## 2020-09-25 DIAGNOSIS — D518 Other vitamin B12 deficiency anemias: Secondary | ICD-10-CM

## 2020-09-25 DIAGNOSIS — D519 Vitamin B12 deficiency anemia, unspecified: Secondary | ICD-10-CM | POA: Diagnosis not present

## 2020-09-25 DIAGNOSIS — D508 Other iron deficiency anemias: Secondary | ICD-10-CM | POA: Diagnosis present

## 2020-09-25 DIAGNOSIS — D510 Vitamin B12 deficiency anemia due to intrinsic factor deficiency: Secondary | ICD-10-CM

## 2020-09-25 DIAGNOSIS — G4700 Insomnia, unspecified: Secondary | ICD-10-CM | POA: Insufficient documentation

## 2020-09-25 DIAGNOSIS — D509 Iron deficiency anemia, unspecified: Secondary | ICD-10-CM | POA: Diagnosis not present

## 2020-09-25 DIAGNOSIS — R5383 Other fatigue: Secondary | ICD-10-CM | POA: Diagnosis not present

## 2020-09-25 DIAGNOSIS — E1140 Type 2 diabetes mellitus with diabetic neuropathy, unspecified: Secondary | ICD-10-CM | POA: Insufficient documentation

## 2020-09-25 DIAGNOSIS — M1990 Unspecified osteoarthritis, unspecified site: Secondary | ICD-10-CM | POA: Diagnosis not present

## 2020-09-25 DIAGNOSIS — Z9884 Bariatric surgery status: Secondary | ICD-10-CM | POA: Diagnosis present

## 2020-09-25 DIAGNOSIS — E559 Vitamin D deficiency, unspecified: Secondary | ICD-10-CM

## 2020-09-25 DIAGNOSIS — Z96653 Presence of artificial knee joint, bilateral: Secondary | ICD-10-CM | POA: Diagnosis not present

## 2020-09-25 DIAGNOSIS — F32A Depression, unspecified: Secondary | ICD-10-CM | POA: Diagnosis not present

## 2020-09-25 LAB — IRON AND TIBC
Iron: 87 ug/dL (ref 41–142)
Saturation Ratios: 28 % (ref 21–57)
TIBC: 311 ug/dL (ref 236–444)
UIBC: 225 ug/dL (ref 120–384)

## 2020-09-25 LAB — CBC WITH DIFFERENTIAL (CANCER CENTER ONLY)
Abs Immature Granulocytes: 0.01 10*3/uL (ref 0.00–0.07)
Basophils Absolute: 0 10*3/uL (ref 0.0–0.1)
Basophils Relative: 1 %
Eosinophils Absolute: 0.1 10*3/uL (ref 0.0–0.5)
Eosinophils Relative: 2 %
HCT: 39.4 % (ref 36.0–46.0)
Hemoglobin: 12.7 g/dL (ref 12.0–15.0)
Immature Granulocytes: 0 %
Lymphocytes Relative: 30 %
Lymphs Abs: 1.5 10*3/uL (ref 0.7–4.0)
MCH: 30.6 pg (ref 26.0–34.0)
MCHC: 32.2 g/dL (ref 30.0–36.0)
MCV: 94.9 fL (ref 80.0–100.0)
Monocytes Absolute: 0.3 10*3/uL (ref 0.1–1.0)
Monocytes Relative: 7 %
Neutro Abs: 3 10*3/uL (ref 1.7–7.7)
Neutrophils Relative %: 60 %
Platelet Count: 252 10*3/uL (ref 150–400)
RBC: 4.15 MIL/uL (ref 3.87–5.11)
RDW: 13.2 % (ref 11.5–15.5)
WBC Count: 4.9 10*3/uL (ref 4.0–10.5)
nRBC: 0 % (ref 0.0–0.2)

## 2020-09-25 LAB — CMP (CANCER CENTER ONLY)
ALT: 11 U/L (ref 0–44)
AST: 19 U/L (ref 15–41)
Albumin: 4 g/dL (ref 3.5–5.0)
Alkaline Phosphatase: 132 U/L — ABNORMAL HIGH (ref 38–126)
Anion gap: 10 (ref 5–15)
BUN: 14 mg/dL (ref 8–23)
CO2: 25 mmol/L (ref 22–32)
Calcium: 9.6 mg/dL (ref 8.9–10.3)
Chloride: 106 mmol/L (ref 98–111)
Creatinine: 1.09 mg/dL — ABNORMAL HIGH (ref 0.44–1.00)
GFR, Estimated: 57 mL/min — ABNORMAL LOW
Glucose, Bld: 174 mg/dL — ABNORMAL HIGH (ref 70–99)
Potassium: 4 mmol/L (ref 3.5–5.1)
Sodium: 141 mmol/L (ref 135–145)
Total Bilirubin: 0.8 mg/dL (ref 0.3–1.2)
Total Protein: 7.3 g/dL (ref 6.5–8.1)

## 2020-09-25 LAB — VITAMIN D 25 HYDROXY (VIT D DEFICIENCY, FRACTURES): Vit D, 25-Hydroxy: 35.34 ng/mL (ref 30–100)

## 2020-09-25 LAB — VITAMIN B12: Vitamin B-12: 532 pg/mL (ref 180–914)

## 2020-09-25 LAB — FERRITIN: Ferritin: 85 ng/mL (ref 11–307)

## 2020-09-25 NOTE — Progress Notes (Signed)
Today's ov note faxed to Dr Glennon Mac

## 2020-12-23 LAB — COLOGUARD: COLOGUARD: NEGATIVE

## 2020-12-23 LAB — EXTERNAL GENERIC LAB PROCEDURE: COLOGUARD: NEGATIVE

## 2021-01-24 ENCOUNTER — Other Ambulatory Visit: Payer: Self-pay | Admitting: Hematology

## 2021-05-24 ENCOUNTER — Other Ambulatory Visit: Payer: Self-pay | Admitting: Hematology

## 2021-06-29 DIAGNOSIS — I739 Peripheral vascular disease, unspecified: Secondary | ICD-10-CM | POA: Insufficient documentation

## 2021-06-29 DIAGNOSIS — Z9884 Bariatric surgery status: Secondary | ICD-10-CM | POA: Insufficient documentation

## 2021-06-30 DIAGNOSIS — Z8616 Personal history of COVID-19: Secondary | ICD-10-CM | POA: Insufficient documentation

## 2021-06-30 DIAGNOSIS — R42 Dizziness and giddiness: Secondary | ICD-10-CM | POA: Insufficient documentation

## 2021-06-30 DIAGNOSIS — Z636 Dependent relative needing care at home: Secondary | ICD-10-CM | POA: Insufficient documentation

## 2021-06-30 DIAGNOSIS — R0600 Dyspnea, unspecified: Secondary | ICD-10-CM | POA: Insufficient documentation

## 2021-06-30 DIAGNOSIS — K9189 Other postprocedural complications and disorders of digestive system: Secondary | ICD-10-CM | POA: Insufficient documentation

## 2021-07-07 ENCOUNTER — Other Ambulatory Visit: Payer: Self-pay | Admitting: Family Medicine

## 2021-07-07 DIAGNOSIS — M8580 Other specified disorders of bone density and structure, unspecified site: Secondary | ICD-10-CM

## 2021-07-07 DIAGNOSIS — Z1231 Encounter for screening mammogram for malignant neoplasm of breast: Secondary | ICD-10-CM

## 2021-07-07 DIAGNOSIS — Z13820 Encounter for screening for osteoporosis: Secondary | ICD-10-CM

## 2021-07-07 DIAGNOSIS — R944 Abnormal results of kidney function studies: Secondary | ICD-10-CM | POA: Insufficient documentation

## 2021-07-07 DIAGNOSIS — Z79891 Long term (current) use of opiate analgesic: Secondary | ICD-10-CM | POA: Insufficient documentation

## 2021-07-13 DIAGNOSIS — N1831 Chronic kidney disease, stage 3a: Secondary | ICD-10-CM | POA: Insufficient documentation

## 2021-07-23 ENCOUNTER — Ambulatory Visit
Admission: RE | Admit: 2021-07-23 | Discharge: 2021-07-23 | Disposition: A | Discharge status: Home / Self Care | Payer: Medicare HMO | Source: Ambulatory Visit | Attending: Family Medicine | Admitting: Family Medicine

## 2021-07-23 DIAGNOSIS — Z1231 Encounter for screening mammogram for malignant neoplasm of breast: Secondary | ICD-10-CM

## 2021-07-26 ENCOUNTER — Other Ambulatory Visit: Payer: Self-pay | Admitting: Family Medicine

## 2021-07-26 ENCOUNTER — Ambulatory Visit
Admission: RE | Admit: 2021-07-26 | Discharge: 2021-07-26 | Disposition: A | Discharge status: Home / Self Care | Payer: Medicare HMO | Source: Ambulatory Visit | Attending: Family Medicine | Admitting: Family Medicine

## 2021-07-26 DIAGNOSIS — R0602 Shortness of breath: Secondary | ICD-10-CM

## 2021-07-26 DIAGNOSIS — Z8616 Personal history of COVID-19: Secondary | ICD-10-CM

## 2021-08-19 ENCOUNTER — Ambulatory Visit
Admission: RE | Admit: 2021-08-19 | Discharge: 2021-08-19 | Disposition: A | Discharge status: Home / Self Care | Payer: Medicare HMO | Source: Ambulatory Visit | Attending: Family Medicine | Admitting: Family Medicine

## 2021-08-19 DIAGNOSIS — Z13820 Encounter for screening for osteoporosis: Secondary | ICD-10-CM

## 2021-08-19 DIAGNOSIS — M8580 Other specified disorders of bone density and structure, unspecified site: Secondary | ICD-10-CM

## 2021-09-02 ENCOUNTER — Other Ambulatory Visit: Payer: Self-pay | Admitting: Hematology

## 2021-09-02 NOTE — Telephone Encounter (Signed)
Medication needs to be managed by pt's PCP since f/u is with PCP and not Dr. Burr Medico ?

## 2021-11-03 ENCOUNTER — Other Ambulatory Visit: Payer: Self-pay | Admitting: Hematology

## 2021-11-08 ENCOUNTER — Other Ambulatory Visit: Payer: Self-pay

## 2021-11-08 DIAGNOSIS — D519 Vitamin B12 deficiency anemia, unspecified: Secondary | ICD-10-CM

## 2021-11-08 MED ORDER — CYANOCOBALAMIN 1000 MCG/ML IJ KIT: 1000.0000 ug | PACK | INTRAMUSCULAR | 4 refills | Status: DC

## 2021-12-03 DIAGNOSIS — G5600 Carpal tunnel syndrome, unspecified upper limb: Secondary | ICD-10-CM | POA: Insufficient documentation

## 2022-06-28 ENCOUNTER — Other Ambulatory Visit: Payer: Self-pay | Admitting: Geriatric Medicine

## 2022-06-28 DIAGNOSIS — Z1231 Encounter for screening mammogram for malignant neoplasm of breast: Secondary | ICD-10-CM

## 2022-07-07 ENCOUNTER — Other Ambulatory Visit: Payer: Self-pay | Admitting: Orthopedic Surgery

## 2022-07-07 DIAGNOSIS — M545 Low back pain, unspecified: Secondary | ICD-10-CM

## 2022-07-31 ENCOUNTER — Ambulatory Visit
Admission: RE | Admit: 2022-07-31 | Discharge: 2022-07-31 | Disposition: A | Discharge status: Home / Self Care | Payer: Medicare HMO | Source: Ambulatory Visit | Attending: Orthopedic Surgery | Admitting: Orthopedic Surgery

## 2022-07-31 DIAGNOSIS — M545 Low back pain, unspecified: Secondary | ICD-10-CM

## 2022-08-16 ENCOUNTER — Ambulatory Visit
Admission: RE | Admit: 2022-08-16 | Discharge: 2022-08-16 | Disposition: A | Discharge status: Home / Self Care | Payer: Medicare HMO | Source: Ambulatory Visit | Attending: Geriatric Medicine | Admitting: Geriatric Medicine

## 2022-08-16 DIAGNOSIS — Z1231 Encounter for screening mammogram for malignant neoplasm of breast: Secondary | ICD-10-CM

## 2022-11-11 ENCOUNTER — Other Ambulatory Visit: Payer: Self-pay

## 2022-11-11 ENCOUNTER — Other Ambulatory Visit: Payer: Self-pay | Admitting: Hematology

## 2022-11-17 ENCOUNTER — Encounter: Payer: Self-pay | Admitting: Oncology

## 2022-12-16 DIAGNOSIS — T402X5A Adverse effect of other opioids, initial encounter: Secondary | ICD-10-CM | POA: Insufficient documentation

## 2023-01-16 DIAGNOSIS — L659 Nonscarring hair loss, unspecified: Secondary | ICD-10-CM | POA: Insufficient documentation

## 2023-03-17 DIAGNOSIS — M25551 Pain in right hip: Secondary | ICD-10-CM | POA: Insufficient documentation

## 2023-04-12 DIAGNOSIS — M51369 Other intervertebral disc degeneration, lumbar region without mention of lumbar back pain or lower extremity pain: Secondary | ICD-10-CM | POA: Insufficient documentation

## 2023-04-13 ENCOUNTER — Telehealth: Payer: Self-pay

## 2023-04-13 NOTE — Telephone Encounter (Signed)
Pt called stating she was seeing Dr. Mosetta Putt for anemia but was later sent to PCP for future monitoring.  Pt stated her last Ferritin drawn was 26 and was told by her PCP that she may need to contact Dr. Mosetta Putt to restart IV Iron infusions.  Pt stated she's scheduled for surgery in February 2025 and want to know if Dr. Mosetta Putt would like for her to come in to be seen and have labs drawn.  Pt also wanted to know if Dr. Mosetta Putt would like for her to have IV Iron infusions prior to having her surgery in February 2025.  Notified Dr. Mosetta Putt and Vincent Gros, NP of pt's call.

## 2023-04-14 ENCOUNTER — Telehealth: Payer: Self-pay

## 2023-04-14 NOTE — Telephone Encounter (Signed)
Returned pt's call regarding Ferritin.  Stated that Dr. Mosetta Putt would like for the pt to take oral iron along with Vitamin C to help with absorption.  Stated Dr. Latanya Maudlin scheduler will be in contact with the pt to get the pt scheduled the 2nd week of Januray 2025 for lab and ov with someone on Dr. Latanya Maudlin team.  Pt verbalized understanding and have no further questions or concerns.

## 2023-05-08 ENCOUNTER — Encounter: Payer: Self-pay | Admitting: Oncology

## 2023-05-20 ENCOUNTER — Other Ambulatory Visit (HOSPITAL_COMMUNITY): Payer: Self-pay | Admitting: Psychiatry

## 2023-05-23 NOTE — Assessment & Plan Note (Signed)
-  She received monthly vitamin B 12 injections, now at home since 04/2018. Last IV iron infusion was in 10/2018. She is not on iron pills now due to low absorption.

## 2023-05-23 NOTE — Assessment & Plan Note (Signed)
-  iv iron as needed if ferritin<50

## 2023-05-24 ENCOUNTER — Inpatient Hospital Stay: Payer: Medicaid Other | Attending: Hematology

## 2023-05-24 ENCOUNTER — Ambulatory Visit (INDEPENDENT_AMBULATORY_CARE_PROVIDER_SITE_OTHER): Payer: PRIVATE HEALTH INSURANCE | Admitting: Family Medicine

## 2023-05-24 ENCOUNTER — Inpatient Hospital Stay: Payer: PPO

## 2023-05-24 ENCOUNTER — Inpatient Hospital Stay (HOSPITAL_BASED_OUTPATIENT_CLINIC_OR_DEPARTMENT_OTHER): Payer: Medicaid Other | Admitting: Hematology

## 2023-05-24 ENCOUNTER — Encounter: Payer: Self-pay | Admitting: Oncology

## 2023-05-24 VITALS — BP 108/73 | HR 70 | Temp 98.2°F | Resp 18 | Wt 165.0 lb

## 2023-05-24 VITALS — BP 100/74 | HR 69 | Temp 99.6°F | Ht 65.5 in | Wt 165.4 lb

## 2023-05-24 DIAGNOSIS — D509 Iron deficiency anemia, unspecified: Secondary | ICD-10-CM

## 2023-05-24 DIAGNOSIS — K219 Gastro-esophageal reflux disease without esophagitis: Secondary | ICD-10-CM

## 2023-05-24 DIAGNOSIS — E039 Hypothyroidism, unspecified: Secondary | ICD-10-CM

## 2023-05-24 DIAGNOSIS — D510 Vitamin B12 deficiency anemia due to intrinsic factor deficiency: Secondary | ICD-10-CM

## 2023-05-24 DIAGNOSIS — G629 Polyneuropathy, unspecified: Secondary | ICD-10-CM | POA: Insufficient documentation

## 2023-05-24 DIAGNOSIS — M1611 Unilateral primary osteoarthritis, right hip: Secondary | ICD-10-CM

## 2023-05-24 DIAGNOSIS — D500 Iron deficiency anemia secondary to blood loss (chronic): Secondary | ICD-10-CM

## 2023-05-24 DIAGNOSIS — F339 Major depressive disorder, recurrent, unspecified: Secondary | ICD-10-CM

## 2023-05-24 DIAGNOSIS — E1143 Type 2 diabetes mellitus with diabetic autonomic (poly)neuropathy: Secondary | ICD-10-CM

## 2023-05-24 DIAGNOSIS — G894 Chronic pain syndrome: Secondary | ICD-10-CM | POA: Diagnosis not present

## 2023-05-24 DIAGNOSIS — D518 Other vitamin B12 deficiency anemias: Secondary | ICD-10-CM | POA: Insufficient documentation

## 2023-05-24 DIAGNOSIS — E782 Mixed hyperlipidemia: Secondary | ICD-10-CM

## 2023-05-24 LAB — CBC WITH DIFFERENTIAL/PLATELET
Abs Immature Granulocytes: 0.01 10*3/uL (ref 0.00–0.07)
Basophils Absolute: 0.1 10*3/uL (ref 0.0–0.1)
Basophils Relative: 1 %
Eosinophils Absolute: 0.3 10*3/uL (ref 0.0–0.5)
Eosinophils Relative: 8 %
HCT: 40.8 % (ref 36.0–46.0)
Hemoglobin: 12.9 g/dL (ref 12.0–15.0)
Immature Granulocytes: 0 %
Lymphocytes Relative: 37 %
Lymphs Abs: 1.5 10*3/uL (ref 0.7–4.0)
MCH: 30.9 pg (ref 26.0–34.0)
MCHC: 31.6 g/dL (ref 30.0–36.0)
MCV: 97.8 fL (ref 80.0–100.0)
Monocytes Absolute: 0.3 10*3/uL (ref 0.1–1.0)
Monocytes Relative: 7 %
Neutro Abs: 1.9 10*3/uL (ref 1.7–7.7)
Neutrophils Relative %: 47 %
Platelets: 244 10*3/uL (ref 150–400)
RBC: 4.17 MIL/uL (ref 3.87–5.11)
RDW: 14.1 % (ref 11.5–15.5)
WBC: 4 10*3/uL (ref 4.0–10.5)
nRBC: 0 % (ref 0.0–0.2)

## 2023-05-24 LAB — VITAMIN B12: Vitamin B-12: 377 pg/mL (ref 180–914)

## 2023-05-24 LAB — FERRITIN: Ferritin: 22 ng/mL (ref 11–307)

## 2023-05-24 MED ORDER — ROSUVASTATIN CALCIUM 10 MG PO TABS: 10.0000 mg | ORAL_TABLET | Freq: Every day | ORAL | 1 refills | Status: DC

## 2023-05-24 MED ORDER — CYANOCOBALAMIN 1000 MCG/ML IJ SOLN
1000.0000 ug | Freq: Once | INTRAMUSCULAR | Status: AC
  Administered 2023-05-24: 1000 ug via INTRAMUSCULAR
  Filled 2023-05-24: qty 1

## 2023-05-24 NOTE — Progress Notes (Signed)
New Patient Office Visit  Subjective    Patient ID: Kayla Mayo, female    DOB: 04-Dec-1955  Age: 68 y.o. MRN: [id number]  CC:  Chief Complaint  Patient presents with   Establish Care    Establish Care. Patient having right hip replacement February the 25th of this year. Patient notes of type 2 diabetes.    HPI Kayla Mayo presents to establish care. Will refill gabapentin next month when pt reaches out.  Cannot recall the name of the mail order pharmacy that she needs to use. 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 She has labs with her that were drawn at her previous PCP, most recent A1c on 04/10/2024 was 6.1. We have requested records today. She has a right hip replacement scheduled with Dr. Constance Goltz on June 27, 2023. She is Jehovah's Witness, she would not like blood products if they are needed.  She would except saline, ringer solution, electrolyte crystalloids, hetastarch colloids, albumin, erythropoietin, tranexamic acid if needed. Denies need for refills today. Denies concerns today.   Outpatient Encounter Medications as of 05/24/2023  Medication Sig   ACCU-CHEK FASTCLIX LANCETS MISC Use daily as directed for Dx: 250.00   B-D INSULIN SYRINGE 1CC/25GX1" 25G X 1" 1 ML MISC USE AS DIRECTED EVERY 30 DAYS   cyanocobalamin (,VITAMIN B-12,) 1000 MCG/ML injection INJECT EVERY 30 (THIRTY) DAYS AS DIRECTED   cyclobenzaprine (FLEXERIL) 10 MG tablet Take 1 tablet (10 mg total) by mouth 3 (three) times daily as needed for muscle spasms.   diclofenac sodium (VOLTAREN) 1 % GEL Apply 1 application topically 4 (four) times daily as needed (pain.).    dicyclomine (BENTYL) 20 MG tablet Take 20 mg by mouth 3 (three) times daily as needed for spasms (abdominal pain).   fluticasone (FLONASE) 50 MCG/ACT nasal spray Place 2 sprays into both nostrils 2 (two) times daily.   hydrocortisone (PROCTOSOL HC) 2.5 % rectal cream Place 1 application rectally as needed.  (Patient taking differently: Place 1 application  rectally 2 (two) times daily as needed (discomfort/hemorrhoids).)   hydrOXYzine (ATARAX/VISTARIL) 25 MG tablet Take 25 mg by mouth every 8 (eight) hours as needed for anxiety.    levothyroxine (SYNTHROID, LEVOTHROID) 25 MCG tablet Take 25 mcg by mouth at bedtime.    Linaclotide (LINZESS) 290 MCG CAPS capsule Take 290 mcg by mouth daily.    meclizine (ANTIVERT) 25 MG tablet Take 25 mg by mouth 3 (three) times daily as needed for dizziness.    naloxone (NARCAN) 0.4 MG/ML injection Inject 0.4 mg into the skin as needed.   polyethylene glycol powder (GLYCOLAX/MIRALAX) powder Take 17 g by mouth 2 (two) times daily. (Patient taking differently: Take 68 g by mouth 2 (two) times daily.)   Semaglutide (OZEMPIC, 0.25 OR 0.5 MG/DOSE, ) Inject 0.5 mg into the skin every Friday.   senna (SENOKOT) 8.6 MG tablet Take 4 tablets by mouth 2 (two) times daily.   Syringe/Needle, Disp, 25G X 1" 1 ML MISC 1 mL by Does not apply route every 30 (thirty) days.   [DISCONTINUED] rosuvastatin (CRESTOR) 10 MG tablet Take 10 mg by mouth at bedtime.    [DISCONTINUED] CELEBREX 200 MG capsule Take 200 mg by mouth 2 (two) times daily.   [DISCONTINUED] Cyanocobalamin 1000 MCG/ML KIT Inject 1,000 mcg as directed every 30 (thirty) days.   [DISCONTINUED] gabapentin (NEURONTIN) 600 MG tablet Take 600 mg by mouth 5 (five) times daily.    [DISCONTINUED] HYDROcodone-Acetaminophen 10-325 MG/15ML SOLN 1  tablet as needed   [DISCONTINUED] omeprazole (PRILOSEC) 40 MG capsule Take 40 mg by mouth daily.   [DISCONTINUED] potassium chloride SA (KLOR-CON) 20 MEQ tablet Take 1 tablet (20 mEq total) by mouth daily.   [DISCONTINUED] rosuvastatin (CRESTOR) 10 MG tablet Take 1 tablet (10 mg total) by mouth at bedtime.   [DISCONTINUED] traZODone (DESYREL) 100 MG tablet 1& 1/2 TABLETS BY MOUTH AT BEDTIME   Facility-Administered Encounter Medications as of 05/24/2023  Medication   cyanocobalamin  ((VITAMIN B-12)) injection 1,000 mcg    Past Medical History:  Diagnosis Date   Anal fissure    Anemia 03/17/2011   Anxiety    Arthritis    Bursitis of hip    Chronic headaches    Colon polyps    Depression    Diabetes mellitus    type 2    Esophageal spasm    Fibromyalgia    Gallstones    GERD (gastroesophageal reflux disease)    Hyperlipidemia    Hypertension    Hypothyroidism    IBS (irritable bowel syndrome)    Neuropathy    Obesity    Osteopenia 07/2011   t score -1.5 FRAX 2.1%/0%   PONV (postoperative nausea and vomiting)    none with the most recent surgeries    Status post cardiac catheterization 2007   [place],  unsure which hospital it was ; reports it was ruled panic attack as the cause    Vertigo    Occurs 6 times a month;  reports last occurrence was 04/2019    Past Surgical History:  Procedure Laterality Date   APPENDECTOMY     bilareral knee scopes     CARPAL TUNNEL RELEASE     CHOLECYSTECTOMY     GASTRIC BYPASS     left shouler surgery     lower Mayo surgery     TOTAL KNEE ARTHROPLASTY Right 03/04/2019   Procedure: [place] TOTAL KNEE ARTHROPLASTY;  Surgeon: Gean Birchwood, MD;  Location: [facility];  Service: Orthopedics;  Laterality: Right;   TOTAL KNEE ARTHROPLASTY Left 05/13/2019   Procedure: LEFT TOTAL KNEE ARTHROPLASTY;  Surgeon: Gean Birchwood, MD;  Location: [facility];  Service: Orthopedics;  Laterality: Left;   TUBAL LIGATION      Family History  Problem Relation Age of Onset   Diabetes Mother    Stroke Mother    Heart disease Mother    Hypertension Mother    Hyperlipidemia Mother    Anxiety disorder Mother    Diabetes Brother    Alcohol abuse Brother    Drug abuse Brother    Alcohol abuse Father    Cancer Father        Stomach   Diabetes Maternal Aunt    Diabetes Maternal Uncle    Alcohol abuse Maternal Uncle    Diabetes Maternal Grandmother    Dementia Maternal Grandmother    Depression Maternal Grandmother    Alcohol abuse  Paternal Uncle    Cancer Paternal Uncle        Throat   Alcohol abuse Maternal Grandfather    Alcohol abuse Paternal Grandfather    Alcohol abuse Maternal Uncle    Alcohol abuse Paternal Uncle    Alcohol abuse Paternal Uncle    Cancer Paternal Aunt        Leukemia    Social History   Socioeconomic History   Marital status: Married    Spouse name: Not on file   Number of children: 2   Years of education: 73  Highest education level: Associate degree: occupational, Scientist, product/process development, or vocational program  Occupational History   Occupation: Disabled    Employer: UNEMPLOYED  Tobacco Use   Smoking status: Never   Smokeless tobacco: Never  Vaping Use   Vaping status: Never Used  Substance and Sexual Activity   Alcohol use: No   Drug use: No   Sexual activity: Not Currently    Partners: Male    Birth control/protection: Post-menopausal, Surgical    Comment: Tubal lig  Other Topics Concern   Not on file  Social History Narrative   Denies caffeine use    Social Drivers of Health   Financial Resource Strain: Medium Risk (05/24/2023)   Overall Financial Resource Strain (CARDIA)    Difficulty of Paying Living Expenses: Somewhat hard  Food Insecurity: Food Insecurity Present (05/24/2023)   Hunger Vital Sign    Worried About Running Out of Food in the Last Year: Patient declined    Ran Out of Food in the Last Year: Sometimes true  Transportation Needs: Unknown (05/24/2023)   PRAPARE - Transportation    Lack of Transportation (Medical): No    Lack of Transportation (Non-Medical): Patient declined  Physical Activity: Unknown (05/24/2023)   Exercise Vital Sign    Days of Exercise per Week: 0 days    Minutes of Exercise per Session: Not on file  Stress: No Stress Concern Present (05/24/2023)   [employer] of Occupational Health - Occupational Stress Questionnaire    Feeling of Stress : Only a little  Social Connections: Unknown (05/24/2023)   Social Connection and Isolation Panel  [NHANES]    Frequency of Communication with Friends and Family: More than three times a week    Frequency of Social Gatherings with Friends and Family: Patient declined    Attends Religious Services: Patient declined    Database administrator or Organizations: Yes    Attends Banker Meetings: Patient declined    Marital Status: Married  Catering manager Violence: Not At Risk (05/04/2022)   Received from [employer], [facility]   HITS    Over the last 12 months how often did your partner physically hurt you?: Never    Over the last 12 months how often did your partner insult you or talk down to you?: Sometimes    Over the last 12 months how often did your partner threaten you with physical harm?: Never    Over the last 12 months how often did your partner scream or curse at you?: Sometimes    ROS Per HPI      Objective    BP 100/74   Pulse 69   Temp 99.6 F (37.6 C)   Ht 5' 5.5" (1.664 m)   Wt 165 lb 6.4 oz (75 kg)   LMP 05/02/2006   SpO2 98%   BMI 27.11 kg/m   Physical Exam Vitals and nursing note reviewed.  Constitutional:      General: She is not in acute distress.    Appearance: Normal appearance. She is normal weight.  HENT:     Head: Normocephalic and atraumatic.     Right Ear: Tympanic membrane and ear canal normal.     Left Ear: Tympanic membrane and ear canal normal.     Nose: Nose normal.  Eyes:     Extraocular Movements: Extraocular movements intact.     Pupils: Pupils are equal, round, and reactive to light.  Cardiovascular:     Rate and Rhythm: Normal rate and regular rhythm.  Heart sounds: Normal heart sounds.  Pulmonary:     Effort: Pulmonary effort is normal. No respiratory distress.     Breath sounds: Normal breath sounds. No wheezing, rhonchi or rales.  Musculoskeletal:        General: Normal range of motion.     Cervical Mayo: Normal range of motion.     Comments: Using rollator walker  Lymphadenopathy:     Cervical: No  cervical adenopathy.  Neurological:     General: No focal deficit present.     Mental Status: She is alert and oriented to person, place, and time.  Psychiatric:        Mood and Affect: Mood normal.        Thought Content: Thought content normal.         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, unspecified whether long term insulin use (HCC) - Continue current medication regimen  Recurrent major depressive disorder, remission status unspecified (HCC) - Continue current medication regimen  Chronic pain syndrome - Follow up as scheduled - We will manage gabapentin  Mixed hyperlipidemia - Continue current med regimen - Continue efforts in healthy diet and activity level  Gastroesophageal reflux disease without esophagitis -Continue current medication regimen -Follow-up with GI as scheduled  Acquired hypothyroidism -Continue current medication regimen  Iron deficiency anemia, unspecified iron deficiency anemia type - Continue current medication regimen -Follow-up with hematology today as scheduled  Primary osteoarthritis of right hip -Continue current medication regimen -Follow-up with Ortho as scheduled   Return in about 6 months (around 11/21/2023) for Med check, also needs AWV.   Moshe Cipro, FNP

## 2023-05-24 NOTE — Patient Instructions (Signed)
 Vitamin B12 Injection What is this medication? Vitamin B12 (VAHY tuh min B12) prevents and treats low vitamin B12 levels in your body. It is used in people who do not get enough vitamin B12 from their diet or when their digestive tract does not absorb enough. Vitamin B12 plays an important role in maintaining the health of your nervous system and red blood cells. This medicine may be used for other purposes; ask your health care provider or pharmacist if you have questions. COMMON BRAND NAME(S): B-12 Compliance Kit, B-12 Injection Kit, Cyomin, Dodex, LA-12, Nutri-Twelve, Physicians EZ Use B-12, Primabalt, Vitamin Deficiency Injectable System - B12 What should I tell my care team before I take this medication? They need to know if you have any of these conditions: Kidney disease Leber's disease Megaloblastic anemia An unusual or allergic reaction to cyanocobalamin, cobalt, other medications, foods, dyes, or preservatives Pregnant or trying to get pregnant Breast-feeding How should I use this medication? This medication is injected into a muscle or deeply under the skin. It is usually given in a clinic or care team's office. However, your care team may teach you how to inject yourself. Follow all instructions. Talk to your care team about the use of this medication in children. Special care may be needed. Overdosage: If you think you have taken too much of this medicine contact a poison control center or emergency room at once. NOTE: This medicine is only for you. Do not share this medicine with others. What if I miss a dose? If you are given your dose at a clinic or care team's office, call to reschedule your appointment. If you give your own injections, and you miss a dose, take it as soon as you can. If it is almost time for your next dose, take only that dose. Do not take double or extra doses. What may interact with this medication? Alcohol Colchicine This list may not describe all possible  interactions. Give your health care provider a list of all the medicines, herbs, non-prescription drugs, or dietary supplements you use. Also tell them if you smoke, drink alcohol, or use illegal drugs. Some items may interact with your medicine. What should I watch for while using this medication? Visit your care team regularly. You may need blood work done while you are taking this medication. You may need to follow a special diet. Talk to your care team. Limit your alcohol intake and avoid smoking to get the best benefit. What side effects may I notice from receiving this medication? Side effects that you should report to your care team as soon as possible: Allergic reactions--skin rash, itching, hives, swelling of the face, lips, tongue, or throat Swelling of the ankles, hands, or feet Trouble breathing Side effects that usually do not require medical attention (report to your care team if they continue or are bothersome): Diarrhea This list may not describe all possible side effects. Call your doctor for medical advice about side effects. You may report side effects to FDA at 1-800-FDA-1088. Where should I keep my medication? Keep out of the reach of children. Store at room temperature between 15 and 30 degrees C (59 and 85 degrees F). Protect from light. Throw away any unused medication after the expiration date. NOTE: This sheet is a summary. It may not cover all possible information. If you have questions about this medicine, talk to your doctor, pharmacist, or health care provider.  2024 Elsevier/Gold Standard (2020-12-29 00:00:00)

## 2023-05-24 NOTE — Patient Instructions (Addendum)
Welcome to [facility]!  Just let me know in the next month when you need your medication refilled and which mail order pharmacy to send it to.  I be happy to do this for you.  Follow-up with me in about 3 to 6 months for medication management and a Medicare annual wellness visit, sooner if needed.

## 2023-05-24 NOTE — Progress Notes (Signed)
[facility]   Telephone:([phone] [phone]) [phone]   Clinic Follow up Note   Patient Care Team: Moshe Cipro, FNP as PCP - General (Family Medicine)  Date of Service:  05/24/2023  CHIEF COMPLAINT: f/u of anemia  CURRENT THERAPY:  B12 injection monthly at home Oral iron  Oncology History   Anemia of decreased vitamin B12 absorption -She received monthly vitamin B 12 injections, now at home since 04/2018. Last IV iron infusion was in 10/2018. She is not on iron pills now due to low absorption.    Iron deficiency anemia -iv iron as needed if ferritin<50   Assessment and Plan    Anemia Currently not anemic with hemoglobin at 12.9. Previous iron levels were adequate; current iron level pending. Reports fatigue likely unrelated to anemia. Last B12 injection was last month; insurance issues have prevented recent doses. If ferritin is below 20, IV iron will be administered. Fatigue may be due to caregiving responsibilities. - Administer B12 injection today - Check current iron level - If ferritin is below 20, administer IV iron - Continue oral iron with vitamin C - Schedule follow-up lab appointment in 3-4 months  Neuropathy On gabapentin four times a day for neuropathy. 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 - Monitor fatigue and consider other causes if it persists  Plan -Will give her B12 injection today in the office, she will restart home injection next month when her new insurance approves it -Continue oral iron -If today's ferritin is less than 20, I will set up her IV iron. -Lab every 3 months and follow-up in 9 months         Discussed the use of AI scribe software for clinical note transcription with the patient, who gave verbal consent to proceed.  History of Present Illness   The patient, a 68 year old female with a history of anemia  and neuropathy, presents for a follow-up visit. 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 She has been managing her neuropathy with gabapentin, taken four times daily. She has been receiving B12 injections at home for her anemia, but has recently encountered issues with her insurance covering the cost of the B12. She also takes oral iron and vitamin C to manage her anemia.         All other systems were reviewed with the patient and are negative.  MEDICAL HISTORY:  Past Medical History:  Diagnosis Date   Anal fissure    Anemia 03/17/2011   Anxiety    Arthritis    Bursitis of hip    Chronic headaches    Colon polyps    Depression    Diabetes mellitus    type 2    Esophageal spasm    Fibromyalgia    Gallstones    GERD (gastroesophageal reflux disease)    Hyperlipidemia    Hypertension    Hypothyroidism    IBS (irritable bowel syndrome)    Neuropathy    Obesity    Osteopenia 07/2011   t score -1.5 FRAX 2.1%/0%   PONV (postoperative nausea and vomiting)    none with the most recent surgeries    Status post cardiac catheterization 2007   [place], [place] unsure which hospital it was ; reports it was ruled panic attack as the cause    Vertigo    Occurs 6 times a month;  reports last occurrence was 04/2019    SURGICAL HISTORY: Past Surgical History:  Procedure Laterality Date   APPENDECTOMY     bilareral knee scopes     CARPAL TUNNEL RELEASE     CHOLECYSTECTOMY     GASTRIC BYPASS     left shouler surgery     lower back surgery     TOTAL KNEE ARTHROPLASTY Right 03/04/2019   Procedure: RIGH TOTAL KNEE ARTHROPLASTY;  Surgeon: Gean Birchwood, MD;  Location: [facility];  Service: Orthopedics;  Laterality: Right;   TOTAL KNEE ARTHROPLASTY Left 05/13/2019   Procedure: LEFT TOTAL KNEE ARTHROPLASTY;  Surgeon: Gean Birchwood, MD;  Location: [facility];  Service: Orthopedics;  Laterality: Left;   TUBAL LIGATION      I have  reviewed the social history and family history with the patient and they are unchanged from previous note.  ALLERGIES:  is allergic to morphine and codeine, blood-group specific substance, codeine, enalapril, food, oxycodone, peanut-containing drug products, and simvastatin.  MEDICATIONS:  Current Outpatient Medications  Medication Sig Dispense Refill   ACCU-CHEK FASTCLIX LANCETS MISC Use daily as directed for Dx: 250.00     B-D INSULIN SYRINGE 1CC/25GX1" 25G X 1" 1 ML MISC USE AS DIRECTED EVERY 30 DAYS 3 each 3   cyanocobalamin (,VITAMIN B-12,) 1000 MCG/ML injection INJECT EVERY 30 (THIRTY) DAYS AS DIRECTED 3 mL 1   cyclobenzaprine (FLEXERIL) 10 MG tablet Take 1 tablet (10 mg total) by mouth 3 (three) times daily as needed for muscle spasms. 30 tablet 0   diclofenac sodium (VOLTAREN) 1 % GEL Apply 1 application topically 4 (four) times daily as needed (pain.).      dicyclomine (BENTYL) 20 MG tablet Take 20 mg by mouth 3 (three) times daily as needed for spasms (abdominal pain).     fluticasone (FLONASE) 50 MCG/ACT nasal spray Place 2 sprays into both nostrils 2 (two) times daily.     hydrocortisone (PROCTOSOL HC) 2.5 % rectal cream Place 1 application rectally as needed. (Patient taking differently: Place 1 application  rectally 2 (two) times daily as needed (discomfort/hemorrhoids).) 30 g 0   hydrOXYzine (ATARAX/VISTARIL) 25 MG tablet Take 25 mg by mouth every 8 (eight) hours as needed for anxiety.      levothyroxine (SYNTHROID, LEVOTHROID) 25 MCG tablet Take 25 mcg by mouth at bedtime.      Linaclotide (LINZESS) 290 MCG CAPS capsule Take 290 mcg by mouth daily.      meclizine (ANTIVERT) 25 MG tablet Take 25 mg by mouth 3 (three) times daily as needed for dizziness.      naloxone (NARCAN) 0.4 MG/ML injection Inject 0.4 mg into the skin as needed.     polyethylene glycol powder (GLYCOLAX/MIRALAX) powder Take 17 g by mouth 2 (two) times daily. (Patient taking differently: Take 68 g by mouth 2  (two) times daily.) 255 g 0   Semaglutide (OZEMPIC, 0.25 OR 0.5 MG/DOSE, [place]) Inject 0.5 mg into the skin every Friday.     senna (SENOKOT) 8.6 MG tablet Take 4 tablets by mouth 2 (two) times daily.     Syringe/Needle, Disp, 25G X 1" 1 ML MISC 1 mL by Does not apply route every 30 (thirty) days. 3 each 3   No current facility-administered medications for this visit.   Facility-Administered Medications Ordered in Other Visits  Medication Dose Route Frequency Provider Last Rate Last Admin   cyanocobalamin ((VITAMIN B-12)) injection 1,000 mcg  1,000 mcg Intramuscular Q28 days Drue Second, MD  PHYSICAL EXAMINATION: ECOG PERFORMANCE STATUS: 1 - Symptomatic but completely ambulatory  Vitals:   05/24/23 1122  BP: 108/73  Pulse: 70  Resp: 18  Temp: 98.2 F (36.8 C)  SpO2: 100%   Wt Readings from Last 3 Encounters:  05/24/23 165 lb (74.8 kg)  05/24/23 165 lb 6.4 oz (75 kg)  09/25/20 189 lb 9.6 oz (86 kg)     GENERAL:alert, no distress and comfortable SKIN: skin color, texture, turgor are normal, no rashes or significant lesions EYES: normal, Conjunctiva are pink and non-injected, sclera clear NECK: supple, thyroid normal size, non-tender, without nodularity LYMPH:  no palpable lymphadenopathy in the cervical, axillary  LUNGS: clear to auscultation and percussion with normal breathing effort HEART: regular rate & rhythm and no murmurs and no lower extremity edema ABDOMEN:abdomen soft, non-tender and normal bowel sounds Musculoskeletal:no cyanosis of digits and no clubbing  NEURO: alert & oriented x 3 with fluent speech, no focal motor/sensory deficits   LABORATORY DATA:  I have reviewed the data as listed    Latest Ref Rng & Units 05/24/2023   11:06 AM 09/25/2020   10:26 AM 09/26/2019   10:59 AM  CBC  WBC 4.0 - 10.5 K/uL 4.0  4.9  4.2   Hemoglobin 12.0 - 15.0 g/dL 13.0  86.5  78.4   Hematocrit 36.0 - 46.0 % 40.8  39.4  39.5   Platelets 150 - 400 K/uL 244  252  198          Latest Ref Rng & Units 09/25/2020   10:26 AM 09/26/2019   10:53 AM 05/14/2019    4:18 AM  CMP  Glucose 70 - 99 mg/dL 696  295  284   BUN 8 - 23 mg/dL 14  16  11    Creatinine 0.44 - 1.00 mg/dL 1.32  4.40  1.02   Sodium 135 - 145 mmol/L 141  141  137   Potassium 3.5 - 5.1 mmol/L 4.0  3.9  4.8   Chloride 98 - 111 mmol/L 106  105  106   CO2 22 - 32 mmol/L 25  27  26    Calcium 8.9 - 10.3 mg/dL 9.6  9.1  8.7   Total Protein 6.5 - 8.1 g/dL 7.3  7.2    Total Bilirubin 0.3 - 1.2 mg/dL 0.8  0.6    Alkaline Phos 38 - 126 U/L 132  99    AST 15 - 41 U/L 19  25    ALT 0 - 44 U/L 11  17        RADIOGRAPHIC STUDIES: I have personally reviewed the radiological images as listed and agreed with the findings in the report. No results found.    Orders Placed This Encounter  Procedures   CBC with Differential/Platelet    Standing Status:   Standing    Number of Occurrences:   50    Expiration Date:   05/22/2024   Ferritin    Standing Status:   Standing    Number of Occurrences:   20    Expiration Date:   05/22/2024   All questions were answered. The patient knows to call the clinic with any problems, questions or concerns. No barriers to learning was detected. The total time spent in the appointment was 20 minutes.     Malachy Mood, MD 05/24/2023

## 2023-05-25 ENCOUNTER — Encounter: Payer: Self-pay | Admitting: Oncology

## 2023-05-25 ENCOUNTER — Encounter: Payer: Self-pay | Admitting: Hematology

## 2023-05-31 ENCOUNTER — Other Ambulatory Visit: Payer: Self-pay

## 2023-05-31 ENCOUNTER — Telehealth: Payer: Self-pay

## 2023-05-31 NOTE — Telephone Encounter (Signed)
Dr. Mosetta Putt and Olegario Shearer, patient will be scheduled as soon as possible.  Auth Submission: NO AUTH NEEDED Site of care: Site of care: [facility] Payer: [employer] Medication & CPT/J Code(s) submitted: Feraheme (ferumoxytol) [id number] Route of submission (phone, fax, portal): phone Phone # [phone] Fax # Auth type: Buy/Bill PB Units/visits requested: 510mg  x 2 doses Reference number: [id number] Approval from: 05/31/23 to 10/29/23

## 2023-06-01 ENCOUNTER — Encounter: Payer: Self-pay | Admitting: Family Medicine

## 2023-06-05 ENCOUNTER — Encounter: Payer: Self-pay | Admitting: Oncology

## 2023-06-05 ENCOUNTER — Encounter: Payer: Self-pay | Admitting: Hematology

## 2023-06-05 ENCOUNTER — Ambulatory Visit: Payer: Medicare HMO | Admitting: Family Medicine

## 2023-06-05 ENCOUNTER — Other Ambulatory Visit: Payer: Self-pay | Admitting: Family Medicine

## 2023-06-05 ENCOUNTER — Other Ambulatory Visit (HOSPITAL_COMMUNITY): Payer: Self-pay

## 2023-06-05 ENCOUNTER — Other Ambulatory Visit: Payer: Self-pay

## 2023-06-05 MED ORDER — LEVOTHYROXINE SODIUM 25 MCG PO TABS
25.0000 ug | ORAL_TABLET | Freq: Every day | ORAL | 0 refills | Status: DC
  Filled 2023-06-05: qty 90, 90d supply, fill #0

## 2023-06-05 MED ORDER — OZEMPIC (0.25 OR 0.5 MG/DOSE) 2 MG/3ML ~~LOC~~ SOPN
0.5000 mg | PEN_INJECTOR | SUBCUTANEOUS | 1 refills | Status: DC
  Filled 2023-06-05: qty 3, 28d supply, fill #0
  Filled 2023-06-16 – 2023-07-07 (×4): qty 3, 28d supply, fill #1

## 2023-06-05 MED ORDER — DICYCLOMINE HCL 20 MG PO TABS
20.0000 mg | ORAL_TABLET | Freq: Three times a day (TID) | ORAL | 3 refills | Status: DC | PRN
  Filled 2023-06-05: qty 90, 30d supply, fill #0
  Filled 2023-09-16: qty 90, 30d supply, fill #1
  Filled 2024-03-17: qty 90, 30d supply, fill #2

## 2023-06-05 MED ORDER — FLUTICASONE PROPIONATE 50 MCG/ACT NA SUSP
2.0000 | Freq: Two times a day (BID) | NASAL | 1 refills | Status: DC
  Filled 2023-06-05: qty 16, 15d supply, fill #0
  Filled 2023-06-16: qty 16, 15d supply, fill #1

## 2023-06-06 ENCOUNTER — Other Ambulatory Visit: Payer: Self-pay

## 2023-06-07 ENCOUNTER — Other Ambulatory Visit (HOSPITAL_COMMUNITY): Payer: Self-pay

## 2023-06-07 ENCOUNTER — Encounter: Payer: Self-pay | Admitting: Family Medicine

## 2023-06-07 DIAGNOSIS — E1143 Type 2 diabetes mellitus with diabetic autonomic (poly)neuropathy: Secondary | ICD-10-CM

## 2023-06-08 ENCOUNTER — Ambulatory Visit: Payer: PPO

## 2023-06-08 VITALS — BP 127/81 | HR 67 | Temp 98.6°F | Resp 12 | Ht 65.5 in | Wt 168.2 lb

## 2023-06-08 DIAGNOSIS — D509 Iron deficiency anemia, unspecified: Secondary | ICD-10-CM

## 2023-06-08 DIAGNOSIS — D519 Vitamin B12 deficiency anemia, unspecified: Secondary | ICD-10-CM

## 2023-06-08 DIAGNOSIS — D518 Other vitamin B12 deficiency anemias: Secondary | ICD-10-CM

## 2023-06-08 MED ORDER — SODIUM CHLORIDE 0.9 % IV SOLN
510.0000 mg | Freq: Once | INTRAVENOUS | Status: AC
  Administered 2023-06-08: 510 mg via INTRAVENOUS
  Filled 2023-06-08: qty 17

## 2023-06-08 MED ORDER — ACETAMINOPHEN 325 MG PO TABS
650.0000 mg | ORAL_TABLET | Freq: Once | ORAL | Status: AC
Start: 2023-06-08 — End: 2023-06-08
  Administered 2023-06-08: 650 mg via ORAL
  Filled 2023-06-08: qty 2

## 2023-06-08 MED ORDER — DIPHENHYDRAMINE HCL 25 MG PO CAPS
25.0000 mg | ORAL_CAPSULE | Freq: Once | ORAL | Status: AC
  Administered 2023-06-08: 25 mg via ORAL
  Filled 2023-06-08: qty 1

## 2023-06-08 NOTE — Progress Notes (Signed)
 Diagnosis: Iron Deficiency Anemia  Provider:  Mannam, Praveen MD  Procedure: IV Infusion  IV Type: Peripheral, IV Location: R Hand  Feraheme  (Ferumoxytol ), Dose: 510 mg  Infusion Start Time: 1138  Infusion Stop Time: 1153  Post Infusion IV Care: Observation period completed and Peripheral IV Discontinued  Discharge: Condition: Stable, Destination: Home . AVS Provided  Performed by:  Rocky FORBES Sar, RN

## 2023-06-13 NOTE — Patient Instructions (Addendum)
SURGICAL WAITING ROOM VISITATION  Patients having surgery or a procedure may have no more than 2 support people in the waiting area - these visitors may rotate.    Children under the age of 89 must have an adult with them who is not the patient.  Due to an increase in RSV and influenza rates and associated hospitalizations, children ages 66 and under may not visit patients in [facility] hospitals.  Visitors with respiratory illnesses are discouraged from visiting and should remain at home.  If the patient needs to stay at the hospital during part of their recovery, the visitor guidelines for inpatient rooms apply. Pre-op nurse will coordinate an appropriate time for 1 support person to accompany patient in pre-op.  This support person may not rotate.    Please refer to the [facility] website for the visitor guidelines for Inpatients (after your surgery is over and you are in a regular room).       Your procedure is scheduled on: 06-27-23   Report to [facility] Main Entrance    Report to admitting at     11:50   AM   Call this number if you have problems the morning of surgery [phone]   Do not eat food :After Midnight.   After Midnight you may have the following liquids until __11:20 ____ AM/DAY OF SURGERY then nothing by mouth  Water Non-Citrus Juices (without pulp, NO RED-Apple, White grape, White cranberry) Black Coffee (NO MILK/CREAM OR CREAMERS, sugar ok)  Clear Tea (NO MILK/CREAM OR CREAMERS, sugar ok) regular and decaf                             Plain Jell-O (NO RED)                                           Fruit ices (not with fruit pulp, NO RED)                                     Popsicles (NO RED)                                                               Sports drinks like Gatorade (NO RED)                    The day of surgery:  Drink ONE (1) Pre-Surgery G2 BY 11:20  AM the morning of surgery. Drink in one sitting. Do not sip.  This drink  was given to you during your hospital  pre-op appointment visit. Nothing else to drink after completing the  Pre-Surgery  G2.          If you have questions, please contact your surgeon's office.   FOLLOW ANY ADDITIONAL PRE OP INSTRUCTIONS YOU RECEIVED FROM YOUR SURGEON'S OFFICE!!!     Oral Hygiene is also important to reduce your risk of infection.  Remember - BRUSH YOUR TEETH THE MORNING OF SURGERY WITH YOUR REGULAR TOOTHPASTE  DENTURES WILL BE REMOVED PRIOR TO SURGERY PLEASE DO NOT APPLY "Poly grip" OR ADHESIVES!!!   Do NOT smoke after Midnight   Stop all vitamins and herbal supplements 7 days before surgery.   Take these medicines the morning of surgery with A SIP OF WATER: nexium, flonase, meclizine, gabapentin,   OZEMPIC HOLD 7 DAYS PRIOR TO SURGERY  Bring CPAP mask and tubing day of surgery.                              You may not have any metal on your body including hair pins, jewelry, and body piercing             Do not wear make-up, lotions, powders, perfumes/cologne, or deodorant  Do not wear nail polish including gel and S&S, artificial/acrylic nails, or any other type of covering on natural nails including finger and toenails. If you have artificial nails, gel coating, etc. that needs to be removed by a nail salon please have this removed prior to surgery or surgery may need to be canceled/ delayed if the surgeon/ anesthesia feels like they are unable to be safely monitored.   Do not shave  5 days prior to surgery.           Do not bring valuables to the hospital. [facility] IS NOT             RESPONSIBLE   FOR VALUABLES.   Contacts, glasses, dentures or bridgework may not be worn into surgery.   You may Bring small overnight bag day of surgery.   DO NOT BRING YOUR HOME MEDICATIONS TO THE HOSPITAL. PHARMACY WILL DISPENSE MEDICATIONS LISTED ON YOUR MEDICATION LIST TO YOU DURING YOUR ADMISSION IN THE  HOSPITAL!      Special Instructions: Bring a copy of your healthcare power of attorney and living will documents the day of surgery if you haven't scanned them before.              Please read over the following fact sheets you were given: IF YOU HAVE QUESTIONS ABOUT YOUR PRE-OP INSTRUCTIONS PLEASE CALL [phone]    If you test positive for Covid or have been in contact with anyone that has tested positive in the last 10 days please notify you surgeon.      Pre-operative 5 CHG Bath Instructions   You can play a key role in reducing the risk of infection after surgery. Your skin needs to be as free of germs as possible. You can reduce the number of germs on your skin by washing with CHG (chlorhexidine gluconate) soap before surgery. CHG is an antiseptic soap that kills germs and continues to kill germs even after washing.   DO NOT use if you have an allergy to chlorhexidine/CHG or antibacterial soaps. If your skin becomes reddened or irritated, stop using the CHG and notify one of our RNs at [phone].   Please shower with the CHG soap starting 4 days before surgery using the following schedule:     Please keep in mind the following:  DO NOT shave, including legs and underarms, starting the day of your first shower.   You may shave your face at any point before/day of surgery.  Place clean sheets on your bed the day you start using CHG soap. Use a clean washcloth (not used since being washed) for  each shower. DO NOT sleep with pets once you start using the CHG.   CHG Shower Instructions:  If you choose to wash your hair and private area, wash first with your normal shampoo/soap.  After you use shampoo/soap, rinse your hair and body thoroughly to remove shampoo/soap residue.  Turn the water OFF and apply about 3 tablespoons (45 ml) of CHG soap to a CLEAN washcloth.  Apply CHG soap ONLY FROM YOUR NECK DOWN TO YOUR TOES (washing for 3-5 minutes)  DO NOT use CHG soap on face,  private areas, open wounds, or sores.  Pay special attention to the area where your surgery is being performed.  If you are having back surgery, having someone wash your back for you may be helpful. Wait 2 minutes after CHG soap is applied, then you may rinse off the CHG soap.  Pat dry with a clean towel  Put on clean clothes/pajamas   If you choose to wear lotion, please use ONLY the CHG-compatible lotions on the back of this paper.     Additional instructions for the day of surgery: DO NOT APPLY any lotions, deodorants, cologne, or perfumes.   Put on clean/comfortable clothes.  Brush your teeth.  Ask your nurse before applying any prescription medications to the skin.      CHG Compatible Lotions   Aveeno Moisturizing lotion  Cetaphil Moisturizing Cream  Cetaphil Moisturizing Lotion  Clairol Herbal Essence Moisturizing Lotion, Dry Skin  Clairol Herbal Essence Moisturizing Lotion, Extra Dry Skin  Clairol Herbal Essence Moisturizing Lotion, Normal Skin  Curel Age Defying Therapeutic Moisturizing Lotion with Alpha Hydroxy  Curel Extreme Care Body Lotion  Curel Soothing Hands Moisturizing Hand Lotion  Curel Therapeutic Moisturizing Cream, Fragrance-Free  Curel Therapeutic Moisturizing Lotion, Fragrance-Free  Curel Therapeutic Moisturizing Lotion, Original Formula  Eucerin Daily Replenishing Lotion  Eucerin Dry Skin Therapy Plus Alpha Hydroxy Crme  Eucerin Dry Skin Therapy Plus Alpha Hydroxy Lotion  Eucerin Original Crme  Eucerin Original Lotion  Eucerin Plus Crme Eucerin Plus Lotion  Eucerin TriLipid Replenishing Lotion  Keri Anti-Bacterial Hand Lotion  Keri Deep Conditioning Original Lotion Dry Skin Formula Softly Scented  Keri Deep Conditioning Original Lotion, Fragrance Free Sensitive Skin Formula  Keri Lotion Fast Absorbing Fragrance Free Sensitive Skin Formula  Keri Lotion Fast Absorbing Softly Scented Dry Skin Formula  Keri Original Lotion  Keri Skin Renewal Lotion  Keri Silky Smooth Lotion  Keri Silky Smooth Sensitive Skin Lotion  Nivea Body Creamy Conditioning Oil  Nivea Body Extra Enriched Lotion  Nivea Body Original Lotion  Nivea Body Sheer Moisturizing Lotion Nivea Crme  Nivea Skin Firming Lotion  NutraDerm 30 Skin Lotion  NutraDerm Skin Lotion  NutraDerm Therapeutic Skin Cream  NutraDerm Therapeutic Skin Lotion  ProShield Protective Hand Cream  Provon moisturizing lotion   Incentive Spirometer  An incentive spirometer is a tool that can help keep your lungs clear and active. This tool measures how well you are filling your lungs with each breath. Taking long deep breaths may help reverse or decrease the chance of developing breathing (pulmonary) problems (especially infection) following: A long period of time when you are unable to move or be active. BEFORE THE PROCEDURE  If the spirometer includes an indicator to show your best effort, your nurse or respiratory therapist will set it to a desired goal. If possible, sit up straight or lean slightly forward. Try not to slouch. Hold the incentive spirometer in an upright position. INSTRUCTIONS FOR USE  Sit on the edge of  your bed if possible, or sit up as far as you can in bed or on a chair. Hold the incentive spirometer in an upright position. Breathe out normally. Place the mouthpiece in your mouth and seal your lips tightly around it. Breathe in slowly and as deeply as possible, raising the piston or the ball toward the top of the column. Hold your breath for 3-5 seconds or for as long as possible. Allow the piston or ball to fall to the bottom of the column. Remove the mouthpiece from your mouth and breathe out normally. Rest for a few seconds and repeat Steps 1 through 7 at least 10 times every 1-2 hours when you are awake. Take your time and take a few normal breaths between deep breaths. The spirometer may include an indicator to show your best effort. Use the indicator as a goal to  work toward during each repetition. After each set of 10 deep breaths, practice coughing to be sure your lungs are clear. If you have an incision (the cut made at the time of surgery), support your incision when coughing by placing a pillow or rolled up towels firmly against it. Once you are able to get out of bed, walk around indoors and cough well. You may stop using the incentive spirometer when instructed by your caregiver.  RISKS AND COMPLICATIONS Take your time so you do not get dizzy or light-headed. If you are in pain, you may need to take or ask for pain medication before doing incentive spirometry. It is harder to take a deep breath if you are having pain. AFTER USE Rest and breathe slowly and easily. It can be helpful to keep track of a log of your progress. Your caregiver can provide you with a simple table to help with this. If you are using the spirometer at home, follow these instructions: SEEK MEDICAL CARE IF:  You are having difficultly using the spirometer. You have trouble using the spirometer as often as instructed. Your pain medication is not giving enough relief while using the spirometer. You develop fever of 100.5 F (38.1 C) or higher. SEEK IMMEDIATE MEDICAL CARE IF:  You cough up bloody sputum that had not been present before. You develop fever of 102 F (38.9 C) or greater. You develop worsening pain at or near the incision site. MAKE SURE YOU:  Understand these instructions. Will watch your condition. Will get help right away if you are not doing well or get worse. Document Released: 08/29/2006 Document Revised: 07/11/2011 Document Reviewed: 10/30/2006 [facility] Patient Information 2014 [place], [place].   ________________________________________________________________________

## 2023-06-13 NOTE — Progress Notes (Addendum)
Anesthesia Review:HTN, DM2   Hematology clearance in media tab 06-01-23  PCP: Moshe Cipro , NP LOV 05/24/23  Cardiologist :  Chest x-ray : EKG : Echo : Stress test: Cardiac Cath :   Activity level:   Sleep Study/ CPAP :  Fasting Blood Sugar :      / Checks Blood Sugar -- times a day:   Blood Thinner/ Instructions /Last Dose: ASA / Instructions/ Last Dose :    DM- type 2 Hgba1c-  Ozempic- last dose

## 2023-06-14 ENCOUNTER — Other Ambulatory Visit: Payer: Self-pay

## 2023-06-14 ENCOUNTER — Encounter (HOSPITAL_COMMUNITY)
Admission: RE | Admit: 2023-06-14 | Discharge: 2023-06-14 | Disposition: A | Discharge status: Home / Self Care | Payer: PPO | Source: Ambulatory Visit | Attending: Orthopedic Surgery | Admitting: Orthopedic Surgery

## 2023-06-14 ENCOUNTER — Encounter (HOSPITAL_COMMUNITY): Payer: Self-pay

## 2023-06-14 VITALS — BP 118/80 | HR 70 | Temp 98.6°F | Resp 16 | Ht 65.5 in | Wt 165.0 lb

## 2023-06-14 DIAGNOSIS — Z01818 Encounter for other preprocedural examination: Secondary | ICD-10-CM | POA: Diagnosis present

## 2023-06-14 DIAGNOSIS — M1611 Unilateral primary osteoarthritis, right hip: Secondary | ICD-10-CM | POA: Insufficient documentation

## 2023-06-14 DIAGNOSIS — E1143 Type 2 diabetes mellitus with diabetic autonomic (poly)neuropathy: Secondary | ICD-10-CM | POA: Diagnosis not present

## 2023-06-14 LAB — CBC
HCT: 37.2 % (ref 36.0–46.0)
Hemoglobin: 11.5 g/dL — ABNORMAL LOW (ref 12.0–15.0)
MCH: 30.7 pg (ref 26.0–34.0)
MCHC: 30.9 g/dL (ref 30.0–36.0)
MCV: 99.2 fL (ref 80.0–100.0)
Platelets: 208 10*3/uL (ref 150–400)
RBC: 3.75 MIL/uL — ABNORMAL LOW (ref 3.87–5.11)
RDW: 13.8 % (ref 11.5–15.5)
WBC: 4 10*3/uL (ref 4.0–10.5)
nRBC: 0 % (ref 0.0–0.2)

## 2023-06-14 LAB — BASIC METABOLIC PANEL
Anion gap: 9 (ref 5–15)
BUN: 16 mg/dL (ref 8–23)
CO2: 24 mmol/L (ref 22–32)
Calcium: 8.5 mg/dL — ABNORMAL LOW (ref 8.9–10.3)
Chloride: 107 mmol/L (ref 98–111)
Creatinine, Ser: 0.79 mg/dL (ref 0.44–1.00)
GFR, Estimated: >60 mL/min (ref >60)
Glucose, Bld: 157 mg/dL — ABNORMAL HIGH (ref 70–99)
Potassium: 3.3 mmol/L — ABNORMAL LOW (ref 3.5–5.1)
Sodium: 140 mmol/L (ref 135–145)

## 2023-06-14 LAB — HEMOGLOBIN A1C
Hgb A1c MFr Bld: 5.7 % — ABNORMAL HIGH (ref 4.8–5.6)
Mean Plasma Glucose: 116.89 mg/dL

## 2023-06-14 LAB — SURGICAL PCR SCREEN
MRSA, PCR: NEGATIVE
Staphylococcus aureus: NEGATIVE

## 2023-06-14 LAB — GLUCOSE, CAPILLARY: Glucose-Capillary: 158 mg/dL — ABNORMAL HIGH (ref 70–99)

## 2023-06-14 LAB — NO BLOOD PRODUCTS

## 2023-06-15 ENCOUNTER — Ambulatory Visit: Payer: PPO

## 2023-06-15 VITALS — BP 104/70 | HR 68 | Temp 98.5°F | Resp 16 | Ht 65.0 in | Wt 168.4 lb

## 2023-06-15 DIAGNOSIS — D509 Iron deficiency anemia, unspecified: Secondary | ICD-10-CM | POA: Diagnosis not present

## 2023-06-15 DIAGNOSIS — D518 Other vitamin B12 deficiency anemias: Secondary | ICD-10-CM

## 2023-06-15 DIAGNOSIS — D519 Vitamin B12 deficiency anemia, unspecified: Secondary | ICD-10-CM

## 2023-06-15 MED ORDER — DIPHENHYDRAMINE HCL 25 MG PO CAPS
25.0000 mg | ORAL_CAPSULE | Freq: Once | ORAL | Status: AC
  Administered 2023-06-15: 25 mg via ORAL
  Filled 2023-06-15: qty 1

## 2023-06-15 MED ORDER — SODIUM CHLORIDE 0.9 % IV SOLN
510.0000 mg | Freq: Once | INTRAVENOUS | Status: AC
  Administered 2023-06-15: 510 mg via INTRAVENOUS
  Filled 2023-06-15: qty 17

## 2023-06-15 MED ORDER — ACETAMINOPHEN 325 MG PO TABS
650.0000 mg | ORAL_TABLET | Freq: Once | ORAL | Status: AC
Start: 2023-06-15 — End: 2023-06-15
  Administered 2023-06-15: 650 mg via ORAL
  Filled 2023-06-15: qty 2

## 2023-06-15 NOTE — Progress Notes (Signed)
Diagnosis: Iron Deficiency Anemia  Provider:  Chilton Greathouse MD  Procedure: IV Infusion  IV Type: Peripheral, IV Location: R Forearm  Feraheme (Ferumoxytol), Dose: 510 mg  Infusion Start Time: 1133  Infusion Stop Time: 1153  Post Infusion IV Care: Observation period completed and Peripheral IV Discontinued  Discharge: Condition: Good, Destination: Home . AVS Declined  Performed by:  Adriana Mccallum, RN

## 2023-06-16 ENCOUNTER — Other Ambulatory Visit: Payer: Self-pay

## 2023-06-20 ENCOUNTER — Telehealth: Payer: Self-pay

## 2023-06-20 ENCOUNTER — Other Ambulatory Visit (HOSPITAL_COMMUNITY): Payer: Self-pay

## 2023-06-20 NOTE — Telephone Encounter (Signed)
 Pharmacy Patient Advocate Encounter   Received notification from Patient Advice Request messages that prior authorization for Ozempic (0.25 or 0.5 MG/DOSE) 2MG /3ML pen-injectors is required/requested.   Insurance verification completed.   The patient is insured through [facility] ADVANTAGE/RX ADVANCE .   Per test claim: PA required and submitted KEY/EOC/Request #: [id number] due to: Prior Authorization duplicate/approved  Refill too soon. PA is not needed at this time. Medication was filled 06/05/23. Next eligible fill date is 06/26/23.

## 2023-06-21 ENCOUNTER — Encounter: Payer: Self-pay | Admitting: Oncology

## 2023-06-22 NOTE — H&P (Signed)
 TOTAL HIP ADMISSION H&P  Patient is admitted for right total hip arthroplasty.  Therapy Plans: HEP Disposition: Home with husband & children Planned DVT Prophylaxis: aspirin 81mg  BID DME needed: none PCP: Revonda Standard - clearance received TXA: IV Allergies: codeine - hallucinations Anesthesia Concerns: nausea/vomiting BMI: 27.2 Last HgbA1c: 6.1%   Other: - NO BLOOD PRODUCTS - JEHOVAHs WITNESS - History of bilateral TKA - not been happy with her outcome - hydrocodone (she tolerates this), robaxin, tylenol, celebrex - No hx of VTE or cancer - Oncologist (Feng)>> iron infusions - Norco 10 mg q4h >> has now reduced to once daily (we will manage post op meds)  Subjective:  Chief Complaint: right hip pain  HPI: Kayla Mayo, 68 y.o. female, has a history of pain and functional disability in the right hip(s) due to arthritis and patient has failed non-surgical conservative treatments for greater than 12 weeks to include NSAID's and/or analgesics and activity modification.  Onset of symptoms was gradual starting 2 years ago with gradually worsening course since that time.The patient noted no past surgery on the right hip(s).  Patient currently rates pain in the right hip at 8 out of 10 with activity. Patient has worsening of pain with activity and weight bearing, pain that interfers with activities of daily living, and pain with passive range of motion. Patient has evidence of joint space narrowing by imaging studies. This condition presents safety issues increasing the risk of falls. There is no current active infection.  Patient Active Problem List   Diagnosis Date Noted   Status post left knee replacement 05/13/2019   Degenerative arthritis of left knee 05/10/2019   S/P total knee replacement, right 03/04/2019   PONV (postoperative nausea and vomiting) 03/02/2019   Osteoarthritis of right knee 03/01/2019   Chronic pain syndrome 12/30/2015   Relationship problem between partners  12/30/2015   Anemia of decreased vitamin B12 absorption 10/12/2015   Iron deficiency anemia 11/28/2013   Major depressive disorder, recurrent (HCC) 10/06/2012   GAD (generalized anxiety disorder) 08/22/2012   Osteopenia    Vitamin D deficiency 03/23/2011   Insomnia 03/23/2011   Anemia 03/17/2011   Proteinuria 10/19/2010   Back pain 10/19/2010   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) 07/27/2010   Weight gain 07/27/2010   SHOULDER PAIN, RIGHT 05/12/2010   GASTROENTERITIS WITHOUT DEHYDRATION 09/17/2009   DRY SKIN 06/03/2009   HYPERLIPIDEMIA 05/07/2009   Other specified anemias 05/07/2009   Major depressive disorder, recurrent episode, moderate (HCC) 05/07/2009   ESSENTIAL HYPERTENSION, BENIGN 05/07/2009   ARTHRITIS, KNEES, BILATERAL 05/07/2009   DM 01/21/2009   DYSPEPSIA&OTHER SPEC DISORDERS FUNCTION STOMACH 01/21/2009   Constipation 01/21/2009   Dysphagia 01/21/2009   Past Medical History:  Diagnosis Date   Anal fissure    Anemia 03/17/2011   Anxiety    Arthritis    Bursitis of hip    Chronic headaches    Colon polyps    Diabetes mellitus    type 2    Esophageal spasm    Fibromyalgia    neuropathy right arm left leg and feet   Gallstones    GERD (gastroesophageal reflux disease)    Hyperlipidemia    Hypertension    no meds in 2 years   Hypothyroidism    IBS (irritable bowel syndrome)    Neuropathy    Obesity    Osteopenia 07/2011   t score -1.5 FRAX 2.1%/0%   PONV (postoperative nausea and vomiting)    none with the most recent surgeries  Status post cardiac catheterization 2007   [place], [place] unsure which hospital it was ; reports it was ruled panic attack as the cause    Vertigo    Occurs 6 times a month;  reports last occurrence was 04/2019    Past Surgical History:  Procedure Laterality Date   APPENDECTOMY     bilareral knee scopes     CARPAL TUNNEL RELEASE     CHOLECYSTECTOMY     GASTRIC BYPASS     left shouler surgery      lower back surgery     TOTAL KNEE ARTHROPLASTY Right 03/04/2019   Procedure: RIGH TOTAL KNEE ARTHROPLASTY;  Surgeon: Gean Birchwood, MD;  Location: [facility];  Service: Orthopedics;  Laterality: Right;   TOTAL KNEE ARTHROPLASTY Left 05/13/2019   Procedure: LEFT TOTAL KNEE ARTHROPLASTY;  Surgeon: Gean Birchwood, MD;  Location: [facility];  Service: Orthopedics;  Laterality: Left;   TUBAL LIGATION      No current facility-administered medications for this encounter.   Current Outpatient Medications  Medication Sig Dispense Refill Last Dose/Taking   Ascorbic Acid (VITAMIN C) 1000 MG tablet Take 1,000 mg by mouth every Monday, Wednesday, and Friday.   Taking   cyanocobalamin (,VITAMIN B-12,) 1000 MCG/ML injection INJECT EVERY 30 (THIRTY) DAYS AS DIRECTED 3 mL 1 Taking   denosumab (PROLIA) 60 MG/ML SOSY injection Inject 60 mg into the skin every 6 (six) months.   Taking   diclofenac (VOLTAREN) 75 MG EC tablet Take 75 mg by mouth 2 (two) times daily.   Taking   diclofenac sodium (VOLTAREN) 1 % GEL Apply 1 application topically 4 (four) times daily as needed (pain.).    Taking As Needed   dicyclomine (BENTYL) 20 MG tablet Take 1 tablet (20 mg total) by mouth 3 (three) times daily as needed for spasms (abdominal pain). 90 tablet 3 Taking As Needed   esomeprazole (NEXIUM) 40 MG capsule Take 40 mg by mouth daily at 12 noon.   Taking   ferrous sulfate ER (SLOW FE) 142 (45 Fe) MG TBCR tablet Take 1 tablet by mouth every Monday, Wednesday, and Friday.   Taking   fluticasone (FLONASE) 50 MCG/ACT nasal spray Place 2 sprays into both nostrils 2 (two) times daily. 16 g 1 Taking   gabapentin (NEURONTIN) 800 MG tablet Take 800 mg by mouth 4 (four) times daily.   Taking   hydrOXYzine (ATARAX/VISTARIL) 25 MG tablet Take 25 mg by mouth daily as needed for anxiety.   Taking As Needed   levothyroxine (SYNTHROID) 25 MCG tablet Take 1 tablet (25 mcg total) by mouth at bedtime. 90 tablet 0 Taking   Linaclotide (LINZESS) 290 MCG  CAPS capsule Take 290 mcg by mouth daily.    Taking   meclizine (ANTIVERT) 12.5 MG tablet Take 12.5 mg by mouth daily.   Taking   naloxone (NARCAN) 0.4 MG/ML injection Inject 0.4 mg into the skin as needed.   Taking As Needed   polyethylene glycol powder (GLYCOLAX/MIRALAX) powder Take 17 g by mouth 2 (two) times daily. (Patient taking differently: Take 34 g by mouth 2 (two) times daily.) 255 g 0 Taking Differently   rosuvastatin (CRESTOR) 40 MG tablet Take 40 mg by mouth daily.   Taking   Semaglutide,0.25 or 0.5MG /DOS, (OZEMPIC, 0.25 OR 0.5 MG/DOSE,) 2 MG/3ML SOPN Inject 0.5 mg into the skin every Friday. (Patient taking differently: Inject 0.5 mg into the skin every Thursday.) 6 mL 1 Taking Differently   senna (SENOKOT) 8.6 MG tablet Take  4 tablets by mouth 2 (two) times daily.   Taking   ACCU-CHEK FASTCLIX LANCETS MISC Use daily as directed for Dx: 250.00      B-D INSULIN SYRINGE 1CC/25GX1" 25G X 1" 1 ML MISC USE AS DIRECTED EVERY 30 DAYS 3 each 3    HYDROcodone-acetaminophen (NORCO/VICODIN) 5-325 MG tablet Take 1 tablet by mouth every 6 (six) hours as needed for moderate pain (pain score 4-6).      Syringe/Needle, Disp, 25G X 1" 1 ML MISC 1 mL by Does not apply route every 30 (thirty) days. 3 each 3    Facility-Administered Medications Ordered in Other Encounters  Medication Dose Route Frequency Provider Last Rate Last Admin   cyanocobalamin ((VITAMIN B-12)) injection 1,000 mcg  1,000 mcg Intramuscular Q28 days Drue Second, MD       Allergies  Allergen Reactions   Chicken Allergy     Sinus drainage   Morphine And Codeine Itching   Blood-Group Specific Substance     Refuses Blood   Codeine Nausea And Vomiting and Other (See Comments)    hallucinations   Corn-Containing Products     Nasal drainage    Enalapril Cough   Food     Sesame seeds- nasal drainage     Lactose Intolerance (Gi) Nausea And Vomiting   Oxycodone Nausea And Vomiting and Other (See Comments)    hallucinations    Peanut-Containing Drug Products Nausea And Vomiting    migraines   Simvastatin     Muscle pain   Wheat     Nasal drainage     Social History   Tobacco Use   Smoking status: Never   Smokeless tobacco: Never  Substance Use Topics   Alcohol use: No    Family History  Problem Relation Age of Onset   Diabetes Mother    Stroke Mother    Heart disease Mother    Hypertension Mother    Hyperlipidemia Mother    Anxiety disorder Mother    Diabetes Brother    Alcohol abuse Brother    Drug abuse Brother    Alcohol abuse Father    Cancer Father        Stomach   Diabetes Maternal Aunt    Diabetes Maternal Uncle    Alcohol abuse Maternal Uncle    Diabetes Maternal Grandmother    Dementia Maternal Grandmother    Depression Maternal Grandmother    Alcohol abuse Paternal Uncle    Cancer Paternal Uncle        Throat   Alcohol abuse Maternal Grandfather    Alcohol abuse Paternal Grandfather    Alcohol abuse Maternal Uncle    Alcohol abuse Paternal Uncle    Alcohol abuse Paternal Uncle    Cancer Paternal Aunt        Leukemia     Review of Systems  Constitutional:  Negative for chills and fever.  Respiratory:  Negative for cough and shortness of breath.   Cardiovascular:  Negative for chest pain.  Gastrointestinal:  Negative for nausea and vomiting.  Musculoskeletal:  Positive for arthralgias.      Objective:  Physical Exam Well nourished and well developed. General: Alert and oriented x3, cooperative and pleasant, no acute distress. Head: normocephalic, atraumatic, neck supple.  Musculoskeletal: Right hip exam: She does have pain and limited hip flexion internal rotation to 5 degrees with pelvic tilting, external rotation over 20 degrees No significant external rotation contracture with active hip flexion Neuro vas intact distally   Calves soft  and nontender. Motor function intact in LE. Strength 5/5 LE bilaterally. Neuro: Distal pulses 2+. Sensation to light touch  intact in LE.   Vital signs in last 24 hours:    Labs:   Estimated body mass index is 28.02 kg/m as calculated from the following:   Height as of 06/15/23: 5\' 5"  (1.651 m).   Weight as of 06/15/23: 76.4 kg.   Imaging Review Plain radiographs demonstrate severe degenerative joint disease of the right hip(s). The bone quality appears to be adequate for age and reported activity level.      Assessment/Plan:  End stage arthritis, right hip(s)  The patient history, physical examination, clinical judgement of the provider and imaging studies are consistent with end stage degenerative joint disease of the right hip(s) and total hip arthroplasty is deemed medically necessary. The treatment options including medical management, injection therapy, arthroscopy and arthroplasty were discussed at length. The risks and benefits of total hip arthroplasty were presented and reviewed. The risks due to aseptic loosening, infection, stiffness, dislocation/subluxation,  thromboembolic complications and other imponderables were discussed.  The patient acknowledged the explanation, agreed to proceed with the plan and consent was signed. Patient is being admitted for inpatient treatment for surgery, pain control, PT, OT, prophylactic antibiotics, VTE prophylaxis, progressive ambulation and ADL's and discharge planning.The patient is planning to be discharged  home.   Rosalene Billings, PA-C Orthopedic Surgery [facility] [place] [phone]

## 2023-06-27 ENCOUNTER — Ambulatory Visit (HOSPITAL_COMMUNITY): Payer: PPO

## 2023-06-27 ENCOUNTER — Other Ambulatory Visit: Payer: Self-pay

## 2023-06-27 ENCOUNTER — Observation Stay (HOSPITAL_COMMUNITY): Payer: PPO

## 2023-06-27 ENCOUNTER — Encounter (HOSPITAL_COMMUNITY): Payer: Self-pay | Admitting: Orthopedic Surgery

## 2023-06-27 ENCOUNTER — Observation Stay (HOSPITAL_COMMUNITY)
Admission: RE | Admit: 2023-06-27 | Discharge: 2023-06-28 | Disposition: A | Discharge status: Home / Self Care | Payer: PPO | Source: Ambulatory Visit | Attending: Orthopedic Surgery | Admitting: Orthopedic Surgery

## 2023-06-27 ENCOUNTER — Ambulatory Visit (HOSPITAL_BASED_OUTPATIENT_CLINIC_OR_DEPARTMENT_OTHER): Payer: PPO

## 2023-06-27 ENCOUNTER — Encounter (HOSPITAL_COMMUNITY)
Admission: RE | Disposition: A | Discharge status: Home / Self Care | Payer: Self-pay | Source: Ambulatory Visit | Attending: Orthopedic Surgery

## 2023-06-27 DIAGNOSIS — Z79899 Other long term (current) drug therapy: Secondary | ICD-10-CM | POA: Diagnosis not present

## 2023-06-27 DIAGNOSIS — M1611 Unilateral primary osteoarthritis, right hip: Principal | ICD-10-CM | POA: Insufficient documentation

## 2023-06-27 DIAGNOSIS — E039 Hypothyroidism, unspecified: Secondary | ICD-10-CM | POA: Diagnosis not present

## 2023-06-27 DIAGNOSIS — E1143 Type 2 diabetes mellitus with diabetic autonomic (poly)neuropathy: Secondary | ICD-10-CM

## 2023-06-27 DIAGNOSIS — I10 Essential (primary) hypertension: Secondary | ICD-10-CM | POA: Insufficient documentation

## 2023-06-27 DIAGNOSIS — Z96653 Presence of artificial knee joint, bilateral: Secondary | ICD-10-CM | POA: Insufficient documentation

## 2023-06-27 DIAGNOSIS — E119 Type 2 diabetes mellitus without complications: Secondary | ICD-10-CM | POA: Diagnosis not present

## 2023-06-27 DIAGNOSIS — Z96641 Presence of right artificial hip joint: Secondary | ICD-10-CM

## 2023-06-27 DIAGNOSIS — Z01818 Encounter for other preprocedural examination: Secondary | ICD-10-CM

## 2023-06-27 HISTORY — PX: TOTAL HIP ARTHROPLASTY: SHX124

## 2023-06-27 LAB — GLUCOSE, CAPILLARY
Glucose-Capillary: 118 mg/dL — ABNORMAL HIGH (ref 70–99)
Glucose-Capillary: 134 mg/dL — ABNORMAL HIGH (ref 70–99)
Glucose-Capillary: 263 mg/dL — ABNORMAL HIGH (ref 70–99)

## 2023-06-27 SURGERY — ARTHROPLASTY, HIP, TOTAL, ANTERIOR APPROACH
Anesthesia: General | Site: Hip | Laterality: Right

## 2023-06-27 MED ORDER — CEFAZOLIN SODIUM-DEXTROSE 2-4 GM/100ML-% IV SOLN
2.0000 g | INTRAVENOUS | Status: AC
  Administered 2023-06-27: 2 g via INTRAVENOUS
  Filled 2023-06-27: qty 100

## 2023-06-27 MED ORDER — SODIUM CHLORIDE 0.9% FLUSH: 3.0000 mL | Freq: Two times a day (BID) | INTRAVENOUS | Status: DC

## 2023-06-27 MED ORDER — HYDROXYZINE HCL 25 MG PO TABS: 25.0000 mg | ORAL_TABLET | Freq: Every day | ORAL | Status: DC | PRN

## 2023-06-27 MED ORDER — HYDROMORPHONE HCL 1 MG/ML IJ SOLN
INTRAMUSCULAR | Status: DC | PRN
Start: 2023-06-27 — End: 2023-06-27
  Administered 2023-06-27: 1 mg via INTRAVENOUS

## 2023-06-27 MED ORDER — METHOCARBAMOL 500 MG PO TABS
ORAL_TABLET | ORAL | Status: AC
  Filled 2023-06-27: qty 1

## 2023-06-27 MED ORDER — ACETAMINOPHEN 325 MG PO TABS: 325.0000 mg | ORAL_TABLET | Freq: Four times a day (QID) | ORAL | Status: DC | PRN

## 2023-06-27 MED ORDER — FENTANYL CITRATE PF 50 MCG/ML IJ SOSY
PREFILLED_SYRINGE | INTRAMUSCULAR | Status: AC
  Filled 2023-06-27: qty 2

## 2023-06-27 MED ORDER — ACETAMINOPHEN 10 MG/ML IV SOLN
INTRAVENOUS | Status: AC
  Filled 2023-06-27: qty 100

## 2023-06-27 MED ORDER — METHOCARBAMOL 1000 MG/10ML IJ SOLN: 500.0000 mg | Freq: Four times a day (QID) | INTRAMUSCULAR | Status: DC | PRN

## 2023-06-27 MED ORDER — KETAMINE HCL 10 MG/ML IJ SOLN
INTRAMUSCULAR | Status: DC | PRN
  Administered 2023-06-27: 30 mg via INTRAVENOUS

## 2023-06-27 MED ORDER — MECLIZINE HCL 25 MG PO TABS
12.5000 mg | ORAL_TABLET | Freq: Every day | ORAL | Status: DC
  Administered 2023-06-27: 12.5 mg via ORAL
  Filled 2023-06-27 (×3): qty 1

## 2023-06-27 MED ORDER — DIPHENHYDRAMINE HCL 12.5 MG/5ML PO ELIX: 12.5000 mg | ORAL_SOLUTION | ORAL | Status: DC | PRN

## 2023-06-27 MED ORDER — NALOXONE HCL 0.4 MG/ML IJ SOLN: 0.4000 mg | INTRAMUSCULAR | Status: DC | PRN

## 2023-06-27 MED ORDER — SODIUM CHLORIDE 0.9% FLUSH
3.0000 mL | Freq: Two times a day (BID) | INTRAVENOUS | Status: DC
  Administered 2023-06-27 – 2023-06-28 (×2): 10 mL via INTRAVENOUS

## 2023-06-27 MED ORDER — PROPOFOL 1000 MG/100ML IV EMUL
INTRAVENOUS | Status: AC
  Filled 2023-06-27: qty 100

## 2023-06-27 MED ORDER — FENTANYL CITRATE PF 50 MCG/ML IJ SOSY
25.0000 ug | PREFILLED_SYRINGE | INTRAMUSCULAR | Status: DC | PRN
  Administered 2023-06-27: 50 ug via INTRAVENOUS
  Administered 2023-06-27 (×2): 25 ug via INTRAVENOUS
  Administered 2023-06-27: 50 ug via INTRAVENOUS

## 2023-06-27 MED ORDER — PHENOL 1.4 % MT LIQD: 1.0000 | OROMUCOSAL | Status: DC | PRN

## 2023-06-27 MED ORDER — CHLORHEXIDINE GLUCONATE 0.12 % MT SOLN
15.0000 mL | Freq: Once | OROMUCOSAL | Status: AC
  Administered 2023-06-27: 15 mL via OROMUCOSAL

## 2023-06-27 MED ORDER — FENTANYL CITRATE (PF) 100 MCG/2ML IJ SOLN
INTRAMUSCULAR | Status: AC
  Filled 2023-06-27: qty 2

## 2023-06-27 MED ORDER — LEVOTHYROXINE SODIUM 25 MCG PO TABS
25.0000 ug | ORAL_TABLET | Freq: Every day | ORAL | Status: DC
  Administered 2023-06-27: 25 ug via ORAL
  Filled 2023-06-27: qty 1

## 2023-06-27 MED ORDER — TRAMADOL HCL 50 MG PO TABS
ORAL_TABLET | ORAL | Status: AC
Start: 2023-06-27 — End: 2023-06-28
  Filled 2023-06-27: qty 1

## 2023-06-27 MED ORDER — TRAMADOL HCL 50 MG PO TABS
50.0000 mg | ORAL_TABLET | Freq: Once | ORAL | Status: AC
  Administered 2023-06-27: 50 mg via ORAL

## 2023-06-27 MED ORDER — EPHEDRINE SULFATE-NACL 50-0.9 MG/10ML-% IV SOSY
PREFILLED_SYRINGE | INTRAVENOUS | Status: DC | PRN
  Administered 2023-06-27: 10 mg via INTRAVENOUS

## 2023-06-27 MED ORDER — FENTANYL CITRATE PF 50 MCG/ML IJ SOSY
PREFILLED_SYRINGE | INTRAMUSCULAR | Status: AC
  Filled 2023-06-27: qty 1

## 2023-06-27 MED ORDER — DEXAMETHASONE SODIUM PHOSPHATE 10 MG/ML IJ SOLN
INTRAMUSCULAR | Status: AC
  Filled 2023-06-27: qty 1

## 2023-06-27 MED ORDER — ASPIRIN 81 MG PO CHEW
81.0000 mg | CHEWABLE_TABLET | Freq: Two times a day (BID) | ORAL | Status: DC
  Administered 2023-06-27 – 2023-06-28 (×2): 81 mg via ORAL
  Filled 2023-06-27 (×2): qty 1

## 2023-06-27 MED ORDER — LACTATED RINGERS IV SOLN: INTRAVENOUS | Status: DC

## 2023-06-27 MED ORDER — METHOCARBAMOL 500 MG PO TABS
500.0000 mg | ORAL_TABLET | Freq: Four times a day (QID) | ORAL | Status: DC | PRN
  Administered 2023-06-27: 500 mg via ORAL

## 2023-06-27 MED ORDER — HYDROCODONE-ACETAMINOPHEN 5-325 MG PO TABS
1.0000 | ORAL_TABLET | ORAL | Status: DC | PRN
  Administered 2023-06-27 – 2023-06-28 (×2): 2 via ORAL
  Filled 2023-06-27 (×2): qty 2

## 2023-06-27 MED ORDER — PHENYLEPHRINE HCL-NACL 20-0.9 MG/250ML-% IV SOLN
INTRAVENOUS | Status: DC | PRN
Start: 2023-06-27 — End: 2023-06-27
  Administered 2023-06-27: 50 ug/min via INTRAVENOUS

## 2023-06-27 MED ORDER — EPHEDRINE 5 MG/ML INJ
INTRAVENOUS | Status: AC
  Filled 2023-06-27: qty 5

## 2023-06-27 MED ORDER — KETAMINE HCL 50 MG/5ML IJ SOSY
PREFILLED_SYRINGE | INTRAMUSCULAR | Status: AC
  Filled 2023-06-27: qty 5

## 2023-06-27 MED ORDER — TRANEXAMIC ACID-NACL 1000-0.7 MG/100ML-% IV SOLN
INTRAVENOUS | Status: AC
  Filled 2023-06-27: qty 100

## 2023-06-27 MED ORDER — BUPIVACAINE-EPINEPHRINE (PF) 0.25% -1:200000 IJ SOLN
INTRAMUSCULAR | Status: DC | PRN
  Administered 2023-06-27: 30 mL

## 2023-06-27 MED ORDER — AMISULPRIDE (ANTIEMETIC) 5 MG/2ML IV SOLN: 10.0000 mg | Freq: Once | INTRAVENOUS | Status: DC | PRN

## 2023-06-27 MED ORDER — DEXMEDETOMIDINE HCL IN NACL 80 MCG/20ML IV SOLN
INTRAVENOUS | Status: DC | PRN
  Administered 2023-06-27: 8 ug via INTRAVENOUS
  Administered 2023-06-27: 4 ug via INTRAVENOUS

## 2023-06-27 MED ORDER — GLYCOPYRROLATE 0.2 MG/ML IJ SOLN
INTRAMUSCULAR | Status: DC | PRN
Start: 2023-06-27 — End: 2023-06-27
  Administered 2023-06-27: .2 mg via INTRAVENOUS

## 2023-06-27 MED ORDER — CEFAZOLIN SODIUM-DEXTROSE 2-4 GM/100ML-% IV SOLN
2.0000 g | Freq: Four times a day (QID) | INTRAVENOUS | Status: AC
  Administered 2023-06-27 – 2023-06-28 (×2): 2 g via INTRAVENOUS
  Filled 2023-06-27: qty 100

## 2023-06-27 MED ORDER — SODIUM CHLORIDE (PF) 0.9 % IJ SOLN
INTRAMUSCULAR | Status: DC | PRN
  Administered 2023-06-27: 30 mL

## 2023-06-27 MED ORDER — BUPIVACAINE-EPINEPHRINE (PF) 0.25% -1:200000 IJ SOLN
INTRAMUSCULAR | Status: AC
  Filled 2023-06-27: qty 30

## 2023-06-27 MED ORDER — ALUM & MAG HYDROXIDE-SIMETH 200-200-20 MG/5ML PO SUSP: 30.0000 mL | ORAL | Status: DC | PRN

## 2023-06-27 MED ORDER — GABAPENTIN 400 MG PO CAPS
800.0000 mg | ORAL_CAPSULE | Freq: Four times a day (QID) | ORAL | Status: DC
  Administered 2023-06-27 – 2023-06-28 (×3): 800 mg via ORAL
  Filled 2023-06-27 (×4): qty 2

## 2023-06-27 MED ORDER — BISACODYL 10 MG RE SUPP: 10.0000 mg | Freq: Every day | RECTAL | Status: DC | PRN

## 2023-06-27 MED ORDER — DEXAMETHASONE SODIUM PHOSPHATE 10 MG/ML IJ SOLN
INTRAMUSCULAR | Status: DC | PRN
  Administered 2023-06-27: 8 mg via INTRAVENOUS

## 2023-06-27 MED ORDER — POLYETHYLENE GLYCOL 3350 17 G PO PACK
17.0000 g | PACK | Freq: Two times a day (BID) | ORAL | Status: DC
  Administered 2023-06-27 – 2023-06-28 (×2): 17 g via ORAL
  Filled 2023-06-27 (×2): qty 1

## 2023-06-27 MED ORDER — DEXAMETHASONE SODIUM PHOSPHATE 10 MG/ML IJ SOLN: 8.0000 mg | Freq: Once | INTRAMUSCULAR | Status: DC

## 2023-06-27 MED ORDER — HYDROMORPHONE HCL 2 MG/ML IJ SOLN
INTRAMUSCULAR | Status: AC
  Filled 2023-06-27: qty 1

## 2023-06-27 MED ORDER — ROSUVASTATIN CALCIUM 20 MG PO TABS: 40.0000 mg | ORAL_TABLET | Freq: Every day | ORAL | Status: DC

## 2023-06-27 MED ORDER — TRANEXAMIC ACID-NACL 1000-0.7 MG/100ML-% IV SOLN
1000.0000 mg | Freq: Once | INTRAVENOUS | Status: AC
  Administered 2023-06-27: 1000 mg via INTRAVENOUS

## 2023-06-27 MED ORDER — METOCLOPRAMIDE HCL 5 MG/ML IJ SOLN: 5.0000 mg | Freq: Three times a day (TID) | INTRAMUSCULAR | Status: DC | PRN

## 2023-06-27 MED ORDER — ORAL CARE MOUTH RINSE: 15.0000 mL | Freq: Once | OROMUCOSAL | Status: AC

## 2023-06-27 MED ORDER — MIDAZOLAM HCL 2 MG/2ML IJ SOLN
INTRAMUSCULAR | Status: AC
  Filled 2023-06-27: qty 2

## 2023-06-27 MED ORDER — PHENYLEPHRINE 80 MCG/ML (10ML) SYRINGE FOR IV PUSH (FOR BLOOD PRESSURE SUPPORT)
PREFILLED_SYRINGE | INTRAVENOUS | Status: AC
  Filled 2023-06-27: qty 10

## 2023-06-27 MED ORDER — MENTHOL 3 MG MT LOZG: 1.0000 | LOZENGE | OROMUCOSAL | Status: DC | PRN

## 2023-06-27 MED ORDER — INSULIN ASPART 100 UNIT/ML IJ SOLN: 0.0000 [IU] | INTRAMUSCULAR | Status: DC | PRN

## 2023-06-27 MED ORDER — PANTOPRAZOLE SODIUM 40 MG PO TBEC
40.0000 mg | DELAYED_RELEASE_TABLET | Freq: Every day | ORAL | Status: DC
  Administered 2023-06-28: 40 mg via ORAL
  Filled 2023-06-27: qty 1

## 2023-06-27 MED ORDER — SODIUM CHLORIDE 0.9% FLUSH: 3.0000 mL | INTRAVENOUS | Status: DC | PRN

## 2023-06-27 MED ORDER — METOCLOPRAMIDE HCL 5 MG PO TABS: 5.0000 mg | ORAL_TABLET | Freq: Three times a day (TID) | ORAL | Status: DC | PRN

## 2023-06-27 MED ORDER — INSULIN ASPART 100 UNIT/ML IJ SOLN
0.0000 [IU] | Freq: Three times a day (TID) | INTRAMUSCULAR | Status: DC
  Administered 2023-06-28: 2 [IU] via SUBCUTANEOUS
  Administered 2023-06-28: 5 [IU] via SUBCUTANEOUS

## 2023-06-27 MED ORDER — KETOROLAC TROMETHAMINE 30 MG/ML IJ SOLN
INTRAMUSCULAR | Status: DC | PRN
  Administered 2023-06-27: 30 mg via INTRAVENOUS

## 2023-06-27 MED ORDER — PROPOFOL 500 MG/50ML IV EMUL
INTRAVENOUS | Status: DC | PRN
  Administered 2023-06-27: 10 mg via INTRAVENOUS
  Administered 2023-06-27: 30 mg via INTRAVENOUS
  Administered 2023-06-27 (×2): 20 mg via INTRAVENOUS
  Administered 2023-06-27: 170 mg via INTRAVENOUS

## 2023-06-27 MED ORDER — DEXAMETHASONE SODIUM PHOSPHATE 10 MG/ML IJ SOLN
10.0000 mg | Freq: Once | INTRAMUSCULAR | Status: AC
  Administered 2023-06-28: 10 mg via INTRAVENOUS
  Filled 2023-06-27: qty 1

## 2023-06-27 MED ORDER — POVIDONE-IODINE 10 % EX SWAB: 2.0000 | Freq: Once | CUTANEOUS | Status: DC

## 2023-06-27 MED ORDER — SODIUM CHLORIDE (PF) 0.9 % IJ SOLN
INTRAMUSCULAR | Status: AC
  Filled 2023-06-27: qty 30

## 2023-06-27 MED ORDER — HYDROCODONE-ACETAMINOPHEN 7.5-325 MG PO TABS
1.0000 | ORAL_TABLET | ORAL | Status: DC | PRN
  Administered 2023-06-28 (×2): 2 via ORAL
  Filled 2023-06-27: qty 1
  Filled 2023-06-27 (×2): qty 2

## 2023-06-27 MED ORDER — ONDANSETRON HCL 4 MG/2ML IJ SOLN
INTRAMUSCULAR | Status: AC
  Filled 2023-06-27: qty 2

## 2023-06-27 MED ORDER — MORPHINE SULFATE (PF) 2 MG/ML IV SOLN
INTRAVENOUS | Status: AC
  Filled 2023-06-27: qty 1

## 2023-06-27 MED ORDER — SENNA 8.6 MG PO TABS
2.0000 | ORAL_TABLET | Freq: Every day | ORAL | Status: DC
  Administered 2023-06-27: 17.2 mg via ORAL
  Filled 2023-06-27: qty 2

## 2023-06-27 MED ORDER — MORPHINE SULFATE (PF) 2 MG/ML IV SOLN
0.5000 mg | INTRAVENOUS | Status: DC | PRN
  Administered 2023-06-27: 1 mg via INTRAVENOUS

## 2023-06-27 MED ORDER — FERROUS SULFATE 325 (65 FE) MG PO TABS
325.0000 mg | ORAL_TABLET | Freq: Two times a day (BID) | ORAL | Status: DC
  Filled 2023-06-27: qty 1

## 2023-06-27 MED ORDER — FENTANYL CITRATE (PF) 100 MCG/2ML IJ SOLN
INTRAMUSCULAR | Status: DC | PRN
  Administered 2023-06-27 (×4): 50 ug via INTRAVENOUS

## 2023-06-27 MED ORDER — DICYCLOMINE HCL 20 MG PO TABS: 20.0000 mg | ORAL_TABLET | Freq: Three times a day (TID) | ORAL | Status: DC | PRN

## 2023-06-27 MED ORDER — CEFAZOLIN SODIUM-DEXTROSE 2-4 GM/100ML-% IV SOLN
INTRAVENOUS | Status: AC
  Filled 2023-06-27: qty 100

## 2023-06-27 MED ORDER — ONDANSETRON HCL 4 MG/2ML IJ SOLN: 4.0000 mg | Freq: Four times a day (QID) | INTRAMUSCULAR | Status: DC | PRN

## 2023-06-27 MED ORDER — ONDANSETRON HCL 4 MG PO TABS: 4.0000 mg | ORAL_TABLET | Freq: Four times a day (QID) | ORAL | Status: DC | PRN

## 2023-06-27 MED ORDER — LINACLOTIDE 145 MCG PO CAPS
290.0000 ug | ORAL_CAPSULE | Freq: Every day | ORAL | Status: DC
  Administered 2023-06-28: 290 ug via ORAL
  Filled 2023-06-27: qty 2

## 2023-06-27 MED ORDER — ONDANSETRON HCL 4 MG/2ML IJ SOLN
INTRAMUSCULAR | Status: DC | PRN
  Administered 2023-06-27: 4 mg via INTRAVENOUS

## 2023-06-27 MED ORDER — TRANEXAMIC ACID-NACL 1000-0.7 MG/100ML-% IV SOLN
1000.0000 mg | INTRAVENOUS | Status: AC
  Administered 2023-06-27: 1000 mg via INTRAVENOUS
  Filled 2023-06-27: qty 100

## 2023-06-27 MED ORDER — MIDAZOLAM HCL 2 MG/2ML IJ SOLN
INTRAMUSCULAR | Status: DC | PRN
  Administered 2023-06-27: 2 mg via INTRAVENOUS

## 2023-06-27 MED ORDER — PHENYLEPHRINE 80 MCG/ML (10ML) SYRINGE FOR IV PUSH (FOR BLOOD PRESSURE SUPPORT)
PREFILLED_SYRINGE | INTRAVENOUS | Status: DC | PRN
  Administered 2023-06-27 (×2): 120 ug via INTRAVENOUS

## 2023-06-27 MED ORDER — KETOROLAC TROMETHAMINE 30 MG/ML IJ SOLN
INTRAMUSCULAR | Status: AC
  Filled 2023-06-27: qty 1

## 2023-06-27 MED ORDER — ACETAMINOPHEN 10 MG/ML IV SOLN
INTRAVENOUS | Status: DC | PRN
  Administered 2023-06-27: 1000 mg via INTRAVENOUS

## 2023-06-27 MED ORDER — 0.9 % SODIUM CHLORIDE (POUR BTL) OPTIME
TOPICAL | Status: DC | PRN
  Administered 2023-06-27: 1000 mL

## 2023-06-27 SURGICAL SUPPLY — 40 items
BAG COUNTER SPONGE SURGICOUNT (BAG) IMPLANT
BAG ZIPLOCK 12X15 (MISCELLANEOUS) IMPLANT
BLADE SAG 18X100X1.27 (BLADE) ×1 IMPLANT
COVER PERINEAL POST (MISCELLANEOUS) ×1 IMPLANT
COVER SURGICAL LIGHT HANDLE (MISCELLANEOUS) ×1 IMPLANT
CUP ACETBLR 52 OD PINNACLE (Hips) IMPLANT
DERMABOND ADVANCED .7 DNX12 (GAUZE/BANDAGES/DRESSINGS) ×1 IMPLANT
DRAPE FOOT SWITCH (DRAPES) ×1 IMPLANT
DRAPE STERI IOBAN 125X83 (DRAPES) ×1 IMPLANT
DRAPE U-SHAPE 47X51 STRL (DRAPES) ×2 IMPLANT
DRESSING AQUACEL AG SP 3.5X10 (GAUZE/BANDAGES/DRESSINGS) ×1 IMPLANT
DRSG AQUACEL AG ADV 3.5X10 (GAUZE/BANDAGES/DRESSINGS) IMPLANT
DRSG AQUACEL AG SP 3.5X10 (GAUZE/BANDAGES/DRESSINGS) ×1 IMPLANT
DURAPREP 26ML APPLICATOR (WOUND CARE) ×1 IMPLANT
ELECT REM PT RETURN 15FT ADLT (MISCELLANEOUS) ×1 IMPLANT
GLOVE BIO SURGEON STRL SZ 6 (GLOVE) ×1 IMPLANT
GLOVE BIOGEL PI IND STRL 6.5 (GLOVE) ×1 IMPLANT
GLOVE BIOGEL PI IND STRL 7.5 (GLOVE) ×1 IMPLANT
GLOVE ORTHO TXT STRL SZ7.5 (GLOVE) ×2 IMPLANT
GOWN STRL REUS W/ TWL LRG LVL3 (GOWN DISPOSABLE) ×2 IMPLANT
HEAD CERAMIC DELTA 36 PLUS 1.5 (Hips) IMPLANT
HOLDER FOLEY CATH W/STRAP (MISCELLANEOUS) ×1 IMPLANT
KIT TURNOVER KIT A (KITS) IMPLANT
LINER NEUTRAL 52X36MM PLUS 4 (Liner) IMPLANT
MANIFOLD NEPTUNE II (INSTRUMENTS) ×1 IMPLANT
NDL SAFETY ECLIPSE 18X1.5 (NEEDLE) IMPLANT
PACK ANTERIOR HIP CUSTOM (KITS) ×1 IMPLANT
SCREW 6.5MMX30MM (Screw) IMPLANT
STEM FEMORAL SZ6 HIGH ACTIS (Stem) IMPLANT
SUT MNCRL AB 4-0 PS2 18 (SUTURE) ×1 IMPLANT
SUT STRATAFIX 0 PDS 27 VIOLET (SUTURE) ×1 IMPLANT
SUT VIC AB 1 CT1 36 (SUTURE) ×3 IMPLANT
SUT VIC AB 2-0 CT1 TAPERPNT 27 (SUTURE) ×2 IMPLANT
SUTURE STRATFX 0 PDS 27 VIOLET (SUTURE) ×1 IMPLANT
SYR 3ML LL SCALE MARK (SYRINGE) IMPLANT
TOWEL GREEN STERILE FF (TOWEL DISPOSABLE) ×1 IMPLANT
TRAY FOLEY MTR SLVR 14FR STAT (SET/KITS/TRAYS/PACK) IMPLANT
TRAY FOLEY MTR SLVR 16FR STAT (SET/KITS/TRAYS/PACK) ×1 IMPLANT
TUBE SUCTION HIGH CAP CLEAR NV (SUCTIONS) ×1 IMPLANT
WATER STERILE IRR 1000ML POUR (IV SOLUTION) ×1 IMPLANT

## 2023-06-27 NOTE — Op Note (Signed)
 NAME:  Kayla Mayo                ACCOUNT NO.: [id number]      MEDICAL RECORD NO.: [id number]      FACILITY:  [facility]      PHYSICIAN:  Shelda Pal  DATE OF BIRTH:  12-04-1955     DATE OF PROCEDURE:  06/27/2023                                 OPERATIVE REPORT         PREOPERATIVE DIAGNOSIS: Right  hip osteoarthritis.      POSTOPERATIVE DIAGNOSIS:  Right hip osteoarthritis.      PROCEDURE:  Right total hip replacement through an anterior approach   utilizing DePuy THR system, component size 52 mm pinnacle cup, a size 36+4 neutral   Altrex liner, a size 6 Hi Actis stem with a 36+1.5 delta ceramic   ball.      SURGEON:  Madlyn Frankel. Charlann Boxer, M.D.      ASSISTANT:  Rosalene Billings, PA-C     ANESTHESIA:  Spinal.      SPECIMENS:  None.      COMPLICATIONS:  None.      BLOOD LOSS:  250 cc     DRAINS:  None.      INDICATION OF THE PROCEDURE:  Kayla Mayo is a 68 y.o. female who had   presented to office for evaluation of right hip pain.  Radiographs revealed   progressive degenerative changes with bone-on-bone   articulation of the  hip joint, including subchondral cystic changes and osteophytes.  The patient had painful limited range of   motion significantly affecting their overall quality of life and function.  The patient was failing to    respond to conservative measures including medications and/or injections and activity modification and at this point was ready   to proceed with more definitive measures.  Consent was obtained for   benefit of pain relief.  Specific risks of infection, DVT, component   failure, dislocation, neurovascular injury, and need for revision surgery were reviewed in the office.     PROCEDURE IN DETAIL:  The patient was brought to operative theater.   Once adequate anesthesia, preoperative antibiotics, 2 gm of Ancef, 1 gm of Tranexamic Acid, and 10 mg of Decadron were administered, the patient was positioned supine  on the [employer] table.  Once the patient was safely positioned with adequate padding of boney prominences we predraped out the hip, and used fluoroscopy to confirm orientation of the pelvis.      The right hip was then prepped and draped from proximal iliac crest to   mid thigh with a shower curtain technique.      Time-out was performed identifying the patient, planned procedure, and the appropriate extremity.     An incision was then made 2 cm lateral to the   anterior superior iliac spine extending over the orientation of the   tensor fascia lata muscle and sharp dissection was carried down to the   fascia of the muscle.      The fascia was then incised.  The muscle belly was identified and swept   laterally and retractor placed along the superior neck.  Following   cauterization of the circumflex vessels and removing some pericapsular   fat, a second cobra retractor was placed on the inferior  neck.  A T-capsulotomy was made along the line of the   superior neck to the trochanteric fossa, then extended proximally and   distally.  Tag sutures were placed and the retractors were then placed   intracapsular.  We then identified the trochanteric fossa and   orientation of my neck cut and then made a neck osteotomy with the femur on traction.  The femoral   head was removed without difficulty or complication.  Traction was let   off and retractors were placed posterior and anterior around the   acetabulum.      The labrum and foveal tissue were debrided.  I began reaming with a 47 mm   reamer and reamed up to 51 mm reamer with good bony bed preparation and a 52 mm  cup was chosen.  The final 52 mm Pinnacle cup was then impacted under fluoroscopy to confirm the depth of penetration and orientation with respect to   Abduction and forward flexion.  A screw was placed into the ilium followed by the hole eliminator.  The final   36+4 neutral Altrex liner was impacted with good visualized rim  fit.  The cup was positioned anatomically within the acetabular portion of the pelvis.      At this point, the femur was rolled to 100 degrees.  Further capsule was   released off the inferior aspect of the femoral neck.  I then   released the superior capsule proximally.  With the leg in a neutral position the hook was placed laterally   along the femur under the vastus lateralis origin and elevated manually and then held in position using the hook attachment on the bed.  The leg was then extended and adducted with the leg rolled to 100   degrees of external rotation.  Retractors were placed along the medial calcar and posteriorly over the greater trochanter.  Once the proximal femur was fully   exposed, I used a box osteotome to set orientation.  I then began   broaching with the starting chili pepper broach and passed this by hand and then broached up to 6.  With the 6 broach in place I chose a high offset neck and did several trial reductions.  The offset was appropriate, leg lengths   appeared to be equal best matched with the +1.5 head ball trial confirmed radiographically.   Given these findings, I went ahead and dislocated the hip, repositioned all   retractors and positioned the right hip in the extended and abducted position.  The final 6 Hi Actis stem was   chosen and it was impacted down to the level of neck cut.  Based on this   and the trial reductions, a final 36+1.5 delta ceramic ball was chosen and   impacted onto a clean and dry trunnion, and the hip was reduced.  The   hip had been irrigated throughout the case again at this point.  I did   reapproximate the superior capsular leaflet to the anterior leaflet   using #1 Vicryl.  The fascia of the   tensor fascia lata muscle was then reapproximated using #1 Vicryl and #0 Stratafix sutures.  The   remaining wound was closed with 2-0 Vicryl and running 4-0 Monocryl.   The hip was cleaned, dried, and dressed sterilely using  Dermabond and   Aquacel dressing.  The patient was then brought   to recovery room in stable condition tolerating the procedure well.    Morrie Sheldon  Domenic Schwab, PA-C was present for the entirety of the case involved from   preoperative positioning, perioperative retractor management, general   facilitation of the case, as well as primary wound closure as assistant.            Madlyn Frankel Charlann Boxer, M.D.        06/27/2023 12:36 PM

## 2023-06-27 NOTE — Anesthesia Postprocedure Evaluation (Signed)
 Anesthesia Post Note  Patient: Kayla Mayo  Procedure(s) Performed: TOTAL HIP ARTHROPLASTY ANTERIOR APPROACH (Right: Hip)     Patient location during evaluation: PACU Anesthesia Type: General Level of consciousness: awake and alert Pain management: pain level controlled Vital Signs Assessment: post-procedure vital signs reviewed and stable Respiratory status: spontaneous breathing, nonlabored ventilation, respiratory function stable and patient connected to nasal cannula oxygen Cardiovascular status: blood pressure returned to baseline and stable Postop Assessment: no apparent nausea or vomiting Anesthetic complications: no  No notable events documented.  Last Vitals:  Vitals:   06/27/23 1645 06/27/23 1700  BP: 113/67 117/64  Pulse: 73 67  Resp: 15 13  Temp: (!) 36.3 C   SpO2: 100% 99%    Last Pain:  Vitals:   06/27/23 1700  TempSrc:   PainSc: 3                  Kennieth Rad

## 2023-06-27 NOTE — Transfer of Care (Signed)
 Immediate Anesthesia Transfer of Care Note  Patient: Ferrel Logan Truett  Procedure(s) Performed: TOTAL HIP ARTHROPLASTY ANTERIOR APPROACH (Right: Hip)  Patient Location: PACU  Anesthesia Type:General  Level of Consciousness: awake and patient cooperative  Airway & Oxygen Therapy: Patient Spontanous Breathing and Patient connected to face mask  Post-op Assessment: Report given to RN and Post -op Vital signs reviewed and stable  Post vital signs: Reviewed and stable  Last Vitals:  Vitals Value Taken Time  BP 107/66 06/27/23 1525  Temp 36.2 C 06/27/23 1525  Pulse    Resp 15 06/27/23 1529  SpO2    Vitals shown include unfiled device data.  Last Pain:  Vitals:   06/27/23 1221  TempSrc: Oral  PainSc:          Complications: No notable events documented.

## 2023-06-27 NOTE — Discharge Instructions (Signed)

## 2023-06-27 NOTE — Anesthesia Procedure Notes (Signed)
 Procedure Name: LMA Insertion Date/Time: 06/27/2023 1:59 PM  Performed by: Lezlie Lye, CRNAPre-anesthesia Checklist: Patient identified, Emergency Drugs available, Suction available and Patient being monitored Patient Re-evaluated:Patient Re-evaluated prior to induction Oxygen Delivery Method: Circle System Utilized Preoxygenation: Pre-oxygenation with 100% oxygen Induction Type: IV induction LMA: LMA inserted LMA Size: 4.0 Number of attempts: 1 Placement Confirmation: positive ETCO2 Tube secured with: Tape Dental Injury: Teeth and Oropharynx as per pre-operative assessment

## 2023-06-27 NOTE — Anesthesia Preprocedure Evaluation (Addendum)
 Anesthesia Evaluation  Patient identified by MRN, date of birth, ID band Patient awake    Reviewed: Allergy & Precautions, NPO status , Patient's Chart, lab work & pertinent test results  History of Anesthesia Complications (+) PONV and history of anesthetic complications  Airway Mallampati: II  TM Distance: >3 FB Neck ROM: Full    Dental  (+) Dental Advisory Given   Pulmonary neg pulmonary ROS   breath sounds clear to auscultation       Cardiovascular hypertension, Pt. on medications  Rhythm:Regular Rate:Normal     Neuro/Psych  Headaches  Neuromuscular disease    GI/Hepatic Neg liver ROS,GERD  ,,  Endo/Other  diabetes, Type 2Hypothyroidism    Renal/GU negative Renal ROS     Musculoskeletal  (+) Arthritis ,  Fibromyalgia -  Abdominal   Peds  Hematology  (+) Blood dyscrasia, anemia , REFUSES BLOOD PRODUCTS, JEHOVAH'S WITNESS  Anesthesia Other Findings   Reproductive/Obstetrics                              Lab Results  Component Value Date   WBC 4.0 06/14/2023   HGB 11.5 (L) 06/14/2023   HCT 37.2 06/14/2023   MCV 99.2 06/14/2023   PLT 208 06/14/2023   Lab Results  Component Value Date   NA 140 06/14/2023   CL 107 06/14/2023   K 3.3 (L) 06/14/2023   CO2 24 06/14/2023   BUN 16 06/14/2023   CREATININE 0.79 06/14/2023   GFRNONAA >60 06/14/2023   CALCIUM 8.5 (L) 06/14/2023   PHOS 3.9 11/27/2008   ALBUMIN 4.0 09/25/2020   GLUCOSE 157 (H) 06/14/2023    Anesthesia Physical Anesthesia Plan  ASA: 2  Anesthesia Plan: Spinal   Post-op Pain Management: Ofirmev IV (intra-op)*   Induction:   PONV Risk Score and Plan: 3 and Propofol infusion, Dexamethasone and Ondansetron  Airway Management Planned: Natural Airway and Simple Face Mask  Additional Equipment:   Intra-op Plan:   Post-operative Plan:   Informed Consent: I have reviewed the patients History and Physical,  chart, labs and discussed the procedure including the risks, benefits and alternatives for the proposed anesthesia with the patient or authorized representative who has indicated his/her understanding and acceptance.       Plan Discussed with: CRNA  Anesthesia Plan Comments:          Anesthesia Quick Evaluation

## 2023-06-27 NOTE — Interval H&P Note (Signed)
 History and Physical Interval Note:  06/27/2023 12:36 PM  Kayla Mayo  has presented today for surgery, with the diagnosis of RT HIP OSTEOARTHRITIS.  The various methods of treatment have been discussed with the patient and family. After consideration of risks, benefits and other options for treatment, the patient has consented to  Procedure(s): TOTAL HIP ARTHROPLASTY ANTERIOR APPROACH (Right) as a surgical intervention.  The patient's history has been reviewed, patient examined, no change in status, stable for surgery.  I have reviewed the patient's chart and labs.  Questions were answered to the patient's satisfaction.     Shelda Pal

## 2023-06-28 ENCOUNTER — Encounter (HOSPITAL_COMMUNITY): Payer: Self-pay | Admitting: Orthopedic Surgery

## 2023-06-28 DIAGNOSIS — M1611 Unilateral primary osteoarthritis, right hip: Secondary | ICD-10-CM | POA: Diagnosis not present

## 2023-06-28 LAB — BASIC METABOLIC PANEL
Anion gap: 8 (ref 5–15)
BUN: 14 mg/dL (ref 8–23)
CO2: 25 mmol/L (ref 22–32)
Calcium: 8.1 mg/dL — ABNORMAL LOW (ref 8.9–10.3)
Chloride: 102 mmol/L (ref 98–111)
Creatinine, Ser: 0.91 mg/dL (ref 0.44–1.00)
GFR, Estimated: >60 mL/min (ref >60)
Glucose, Bld: 222 mg/dL — ABNORMAL HIGH (ref 70–99)
Potassium: 3.8 mmol/L (ref 3.5–5.1)
Sodium: 135 mmol/L (ref 135–145)

## 2023-06-28 LAB — CBC
HCT: 31.2 % — ABNORMAL LOW (ref 36.0–46.0)
Hemoglobin: 9.9 g/dL — ABNORMAL LOW (ref 12.0–15.0)
MCH: 31.4 pg (ref 26.0–34.0)
MCHC: 31.7 g/dL (ref 30.0–36.0)
MCV: 99 fL (ref 80.0–100.0)
Platelets: 151 10*3/uL (ref 150–400)
RBC: 3.15 MIL/uL — ABNORMAL LOW (ref 3.87–5.11)
RDW: 13.5 % (ref 11.5–15.5)
WBC: 6.1 10*3/uL (ref 4.0–10.5)
nRBC: 0 % (ref 0.0–0.2)

## 2023-06-28 LAB — GLUCOSE, CAPILLARY
Glucose-Capillary: 133 mg/dL — ABNORMAL HIGH (ref 70–99)
Glucose-Capillary: 241 mg/dL — ABNORMAL HIGH (ref 70–99)

## 2023-06-28 MED ORDER — HYDROCODONE-ACETAMINOPHEN 7.5-325 MG PO TABS: 1.0000 | ORAL_TABLET | ORAL | 0 refills | Status: DC | PRN

## 2023-06-28 MED ORDER — ASPIRIN 81 MG PO CHEW: 81.0000 mg | CHEWABLE_TABLET | Freq: Two times a day (BID) | ORAL | 0 refills | Status: AC

## 2023-06-28 MED ORDER — METHOCARBAMOL 500 MG PO TABS: 500.0000 mg | ORAL_TABLET | Freq: Four times a day (QID) | ORAL | 2 refills | Status: DC | PRN

## 2023-06-28 NOTE — Care Management Obs Status (Signed)
 MEDICARE OBSERVATION STATUS NOTIFICATION   Patient Details  Name: Kayla Mayo MRN: [id number] Date of Birth: 05-14-1955   Medicare Observation Status Notification Given:  Yes    Howell Rucks, RN 06/28/2023, 9:32 AM

## 2023-06-28 NOTE — Progress Notes (Signed)
 Patient ID: Kayla Mayo, female   DOB: July 19, 1955, 68 y.o.   MRN: [id number] Subjective: 1 Day Post-Op Procedure(s) (LRB): TOTAL HIP ARTHROPLASTY ANTERIOR APPROACH (Right)    Patient reports pain as mild. No events overnight.  Daughter in room.  Was up on bedside commode this morning.  Objective:   VITALS:   Vitals:   06/28/23 0024 06/28/23 0433  BP: 103/65 103/61  Pulse: 72 69  Resp: 16 16  Temp: 98 F (36.7 C) 98.1 F (36.7 C)  SpO2: 99% 98%    Neurovascular intact Incision: dressing C/D/I  LABS Recent Labs    06/28/23 0338  HGB 9.9*  HCT 31.2*  WBC 6.1  PLT 151    Recent Labs    06/28/23 0338  NA 135  K 3.8  BUN 14  CREATININE 0.91  GLUCOSE 222*    No results for input(s): "LABPT", "INR" in the last 72 hours.   Assessment/Plan: 1 Day Post-Op Procedure(s) (LRB): TOTAL HIP ARTHROPLASTY ANTERIOR APPROACH (Right)   Advance diet Up with therapy Reviewed post op activity and goals RTC in 2 weeks Meds to be sent to pharmacy

## 2023-06-28 NOTE — TOC Transition Note (Signed)
 Transition of Care [facility]) - Discharge Note   Patient Details  Name: Kayla Mayo MRN: [id number] Date of Birth: 12/12/1955  [facility] CM/SW Contact:  Howell Rucks, RN Phone Number: 06/28/2023, 10:49 AM   Clinical Narrative:   Met with pt at bedside to review dc therapy plans and home DME needs, pt confirmed HEP, has walker at home, no home DME needs. No TOC needs.     Final next level of care: Home/Self Care Barriers to Discharge: No Barriers Identified   Patient Goals and CMS Choice Patient states their goals for this hospitalization and ongoing recovery are:: return home          Discharge Placement                       Discharge Plan and Services Additional resources added to the After Visit Summary for                                       Social Drivers of Health (SDOH) Interventions SDOH Screenings   Food Insecurity: Food Insecurity Present (06/27/2023)  Housing: Low Risk  (06/27/2023)  Transportation Needs: No Transportation Needs (06/27/2023)  Utilities: Not At Risk (06/27/2023)  Financial Resource Strain: Medium Risk (05/24/2023)  Physical Activity: Unknown (05/24/2023)  Social Connections: Unknown (06/27/2023)  Stress: No Stress Concern Present (05/24/2023)  Tobacco Use: Low Risk  (06/27/2023)     Readmission Risk Interventions     No data to display

## 2023-06-28 NOTE — Evaluation (Signed)
 Physical Therapy Evaluation Patient Details Name: Kayla Mayo MRN: [id number] DOB: 11/07/1955 Today's Date: 06/28/2023  History of Present Illness  68 yo female s/p R DA THA on 06/27/23. PMH: bil TKAs, chronic pain, DM, MDD, anemia, HTN, fibromyalgia, back surgery, vertigo  Clinical Impression  Pt is s/p THA resulting in the deficits listed below (see PT Problem List).  Pt amb 74' with RW and CGA; will see again in pm, anticipate pt will be ready to d/c later this afternoon  Pt will benefit from acute skilled PT to increase their independence and safety with mobility to facilitate discharge.          If plan is discharge home, recommend the following: A little help with bathing/dressing/bathroom;Assistance with cooking/housework;Help with stairs or ramp for entrance;Assist for transportation   Can travel by private vehicle        Equipment Recommendations None recommended by PT  Recommendations for Other Services       Functional Status Assessment Patient has had a recent decline in their functional status and demonstrates the ability to make significant improvements in function in a reasonable and predictable amount of time.     Precautions / Restrictions Precautions Precautions: Fall Restrictions Weight Bearing Restrictions Per Provider Order: No Other Position/Activity Restrictions: WBAT      Mobility  Bed Mobility               General bed mobility comments: pt in recliner    Transfers Overall transfer level: Needs assistance Equipment used: Rolling walker (2 wheels) Transfers: Sit to/from Stand Sit to Stand: Contact guard assist           General transfer comment: cues for hand  placement and RLE position    Ambulation/Gait Ambulation/Gait assistance: Contact guard assist Gait Distance (Feet): 60 Feet Assistive device: Rolling walker (2 wheels) Gait Pattern/deviations: Step-to pattern, Step-through pattern, Decreased stance time - right        General Gait Details: cues for sequence and RW position. progression to step through with improved wt shift to RLE with distance  Stairs            Wheelchair Mobility     Tilt Bed    Modified Rankin (Stroke Patients Only)       Balance                                             Pertinent Vitals/Pain Pain Assessment Pain Assessment: 0-10 Pain Location: right hip Pain Descriptors / Indicators: Discomfort, Guarding Pain Intervention(s): Limited activity within patient's tolerance, Monitored during session, Premedicated before session    Home Living Family/patient expects to be discharged to:: Private residence Living Arrangements: Spouse/significant other Available Help at Discharge: Family Type of Home: House Home Access: Level entry (small incline)       Home Layout: One level Home Equipment: Cane - single point;Toilet riser      Prior Function Prior Level of Function : Independent/Modified Independent             Mobility Comments: amb with RW       Extremity/Trunk Assessment   Upper Extremity Assessment Upper Extremity Assessment: Overall WFL for tasks assessed    Lower Extremity Assessment Lower Extremity Assessment: RLE deficits/detail RLE Deficits / Details: ankle WFL, knee and hip 2+ to 3/5       Communication   Communication Communication:  No apparent difficulties    Cognition Arousal: Alert Behavior During Therapy: WFL for tasks assessed/performed   PT - Cognitive impairments: No apparent impairments                         Following commands: Intact       Cueing Cueing Techniques: Verbal cues     General Comments      Exercises Total Joint Exercises Ankle Circles/Pumps: AROM, Both, 10 reps   Assessment/Plan    PT Assessment Patient needs continued PT services  PT Problem List Decreased strength;Decreased range of motion;Decreased activity tolerance;Decreased balance;Decreased  mobility;Decreased knowledge of use of DME;Pain       PT Treatment Interventions DME instruction;Gait training;Functional mobility training;Therapeutic activities;Patient/family education;Therapeutic exercise    PT Goals (Current goals can be found in the Care Plan section)  Acute Rehab PT Goals PT Goal Formulation: With patient Time For Goal Achievement: 07/05/23 Potential to Achieve Goals: Good    Frequency 7X/week     Co-evaluation               AM-PAC PT "6 Clicks" Mobility  Outcome Measure Help needed turning from your back to your side while in a flat bed without using bedrails?: A Little Help needed moving from lying on your back to sitting on the side of a flat bed without using bedrails?: A Little Help needed moving to and from a bed to a chair (including a wheelchair)?: A Little Help needed standing up from a chair using your arms (e.g., wheelchair or bedside chair)?: A Little Help needed to walk in hospital room?: A Little Help needed climbing 3-5 steps with a railing? : A Little 6 Click Score: 18    End of Session Equipment Utilized During Treatment: Gait belt Activity Tolerance: Patient tolerated treatment well Patient left: with call bell/phone within reach;in chair;with family/visitor present   PT Visit Diagnosis: Other abnormalities of gait and mobility (R26.89)    Time: 1610-9604 PT Time Calculation (min) (ACUTE ONLY): 26 min   Charges:   PT Evaluation $PT Eval Low Complexity: 1 Low PT Treatments $Gait Training: 8-22 mins PT General Charges $$ ACUTE PT VISIT: 1 Visit         Kagen Kunath, PT  Acute Rehab Dept ([facility]) [phone]  06/28/2023   [facility] 06/28/2023, 11:28 AM

## 2023-06-28 NOTE — Progress Notes (Signed)
 Physical Therapy Treatment Patient Details Name: Kayla Mayo MRN: [id number] DOB: 06-20-1955 Today's Date: 06/28/2023   History of Present Illness 68 yo female s/p R DA THA on 06/27/23. PMH: bil TKAs, chronic pain, DM, MDD, anemia, HTN, fibromyalgia, back surgery, vertigo    PT Comments  Pt progressing well, meeting goals. Reviewed mobility as below, HEP and progression. Ready for d/c from PT standpoint with family assisting prn.     If plan is discharge home, recommend the following: A little help with bathing/dressing/bathroom;Assistance with cooking/housework;Help with stairs or ramp for entrance;Assist for transportation   Can travel by private vehicle        Equipment Recommendations  None recommended by PT    Recommendations for Other Services       Precautions / Restrictions Precautions Precautions: Fall Recall of Precautions/Restrictions: Intact Restrictions Weight Bearing Restrictions Per Provider Order: No Other Position/Activity Restrictions: WBAT     Mobility  Bed Mobility Overal bed mobility: Needs Assistance Bed Mobility: Supine to Sit     Supine to sit: Supervision     General bed mobility comments: pt able to self assist    Transfers Overall transfer level: Needs assistance Equipment used: Rolling walker (2 wheels) Transfers: Sit to/from Stand Sit to Stand: Supervision, Contact guard assist           General transfer comment: cues for hand  placement and RLE position; STS from bed and BSC    Ambulation/Gait Ambulation/Gait assistance: Contact guard assist, Supervision Gait Distance (Feet): 100 Feet Assistive device: Rolling walker (2 wheels) Gait Pattern/deviations: Step-to pattern, Step-through pattern, Decreased stance time - right       General Gait Details: cues for sequence and RW position. pt demonstrates carryover from am session   Stairs             Wheelchair Mobility     Tilt Bed    Modified Rankin  (Stroke Patients Only)       Balance                                            Communication Communication Communication: No apparent difficulties  Cognition Arousal: Alert Behavior During Therapy: WFL for tasks assessed/performed   PT - Cognitive impairments: No apparent impairments                         Following commands: Intact      Cueing Cueing Techniques: Verbal cues  Exercises Total Joint Exercises Ankle Circles/Pumps: AROM, Both, 10 reps Quad Sets: Both, 10 reps Heel Slides: AAROM, AROM, Right, 10 reps Hip ABduction/ADduction: AROM, Right, 10 reps Long Arc Quad: AROM, Strengthening, Right, 10 reps    General Comments        Pertinent Vitals/Pain Pain Assessment Pain Assessment: 0-10 Pain Score: 5  Pain Location: right hip Pain Descriptors / Indicators: Discomfort, Guarding Pain Intervention(s): Limited activity within patient's tolerance, Monitored during session, Repositioned, Premedicated before session    Home Living                          Prior Function            PT Goals (current goals can now be found in the care plan section) Acute Rehab PT Goals PT Goal Formulation: With patient Time For Goal Achievement: 07/05/23 Potential to  Achieve Goals: Good Progress towards PT goals: Progressing toward goals    Frequency    7X/week      PT Plan      Co-evaluation              AM-PAC PT "6 Clicks" Mobility   Outcome Measure  Help needed turning from your back to your side while in a flat bed without using bedrails?: A Little Help needed moving from lying on your back to sitting on the side of a flat bed without using bedrails?: A Little Help needed moving to and from a bed to a chair (including a wheelchair)?: A Little Help needed standing up from a chair using your arms (e.g., wheelchair or bedside chair)?: A Little Help needed to walk in hospital room?: A Little Help needed climbing 3-5  steps with a railing? : A Little 6 Click Score: 18    End of Session Equipment Utilized During Treatment: Gait belt Activity Tolerance: Patient tolerated treatment well Patient left: in bed;with call bell/phone within reach;with family/visitor present Nurse Communication: Mobility status PT Visit Diagnosis: Other abnormalities of gait and mobility (R26.89)     Time: 4098-1191 PT Time Calculation (min) (ACUTE ONLY): 36 min  Charges:    $Gait Training: 8-22 mins $Therapeutic Exercise: 8-22 mins PT General Charges $$ ACUTE PT VISIT: 1 Visit                     Delice Bison, PT  Acute Rehab Dept [facility]) [phone]  06/28/2023    [facility] 06/28/2023, 3:39 PM

## 2023-06-28 NOTE — Progress Notes (Signed)
 Socials Services Guide added to AVS

## 2023-06-28 NOTE — Plan of Care (Signed)

## 2023-07-04 ENCOUNTER — Telehealth: Payer: Self-pay

## 2023-07-04 ENCOUNTER — Other Ambulatory Visit (HOSPITAL_COMMUNITY): Payer: Self-pay

## 2023-07-04 NOTE — Telephone Encounter (Signed)
 Good morning! Noticed patient is now due for refill. Would you be able to get the new PA in? Thanks in advance!

## 2023-07-04 NOTE — Telephone Encounter (Signed)
 Pharmacy Patient Advocate Encounter   Received notification from Pt Calls Messages that prior authorization for [facility] is required/requested.   Insurance verification completed.   The patient is insured through [facility] ADVANTAGE/RX ADVANCE .   Per test claim: The current 28 day co-pay is, $0.  No PA needed at this time. This test claim was processed through [facility]- copay amounts may vary at other pharmacies due to pharmacy/plan contracts, or as the patient moves through the different stages of their insurance plan.        Found an approval letter in media. PA is valid through 06/12/24

## 2023-07-04 NOTE — Discharge Summary (Signed)
 Patient ID: Kayla Mayo MRN: [id number] DOB/AGE: 68-Aug-1957 68 y.o.  Admit date: 06/27/2023 Discharge date: 06/28/2023  Admission Diagnoses:  Right hip osteoarthritis  Discharge Diagnoses:  Principal Problem:   S/P total right hip arthroplasty   Past Medical History:  Diagnosis Date   Anal fissure    Anemia 03/17/2011   Anxiety    Arthritis    Bursitis of hip    Chronic headaches    Colon polyps    Diabetes mellitus    type 2    Esophageal spasm    Fibromyalgia    neuropathy right arm left leg and feet   Gallstones    GERD (gastroesophageal reflux disease)    Hyperlipidemia    Hypertension    no meds in 2 years   Hypothyroidism    IBS (irritable bowel syndrome)    Neuropathy    Obesity    Osteopenia 07/2011   t score -1.5 FRAX 2.1%/0%   PONV (postoperative nausea and vomiting)    none with the most recent surgeries    Status post cardiac catheterization 2007   [place], [place] unsure which hospital it was ; reports it was ruled panic attack as the cause    Vertigo    Occurs 6 times a month;  reports last occurrence was 04/2019    Surgeries: Procedure(s): TOTAL HIP ARTHROPLASTY ANTERIOR APPROACH on 06/27/2023   Consultants:   Discharged Condition: Improved  Hospital Course: Kayla Mayo is an 68 y.o. female who was admitted 06/27/2023 for operative treatment ofS/P total right hip arthroplasty. Patient has severe unremitting pain that affects sleep, daily activities, and work/hobbies. After pre-op clearance the patient was taken to the operating room on 06/27/2023 and underwent  Procedure(s): TOTAL HIP ARTHROPLASTY ANTERIOR APPROACH.    Patient was given perioperative antibiotics:  Anti-infectives (From admission, onward)    Start     Dose/Rate Route Frequency Ordered Stop   06/27/23 2000  ceFAZolin (ANCEF) IVPB 2g/100 mL premix        2 g 200 mL/hr over 30 Minutes Intravenous Every 6 hours 06/27/23 1828 06/28/23 0739   06/27/23 1944  ceFAZolin  (ANCEF) 2-4 GM/100ML-% IVPB       Note to Pharmacy: Norville Haggard C: cabinet override      06/27/23 1944 06/27/23 2148   06/27/23 1215  ceFAZolin (ANCEF) IVPB 2g/100 mL premix        2 g 200 mL/hr over 30 Minutes Intravenous On call to O.R. 06/27/23 1211 06/27/23 1419        Patient was given sequential compression devices, early ambulation, and chemoprophylaxis to prevent DVT. Patient worked with PT and was meeting their goals regarding safe ambulation and transfers.  Patient benefited maximally from hospital stay and there were no complications.    Recent vital signs: No data found.   Recent laboratory studies: No results for input(s): "WBC", "HGB", "HCT", "PLT", "NA", "K", "CL", "CO2", "BUN", "CREATININE", "GLUCOSE", "INR", "CALCIUM" in the last 72 hours.  Invalid input(s): "PT", "2"   Discharge Medications:   Allergies as of 06/28/2023       Reactions   Morphine And Codeine Itching   Blood-group Specific Substance    Refuses Blood   Codeine Nausea And Vomiting, Other (See Comments)   hallucinations   Enalapril Cough   Lactose Intolerance (gi) Nausea And Vomiting   Oxycodone Nausea And Vomiting, Other (See Comments)   hallucinations   Peanut-containing Drug Products Nausea And Vomiting   migraines   Simvastatin  Muscle pain        Medication List     STOP taking these medications    diclofenac 75 MG EC tablet Commonly known as: VOLTAREN   HYDROcodone-acetaminophen 5-325 MG tablet Commonly known as: NORCO/VICODIN Replaced by: HYDROcodone-acetaminophen 7.5-325 MG tablet       TAKE these medications    Accu-Chek FastClix Lancets Misc Use daily as directed for Dx: 250.00   aspirin 81 MG chewable tablet Chew 1 tablet (81 mg total) by mouth 2 (two) times daily for 28 days.   B-D INSULIN SYRINGE 1CC/25GX1" 25G X 1" 1 ML Misc Generic drug: Insulin Syringe-Needle U-100 USE AS DIRECTED EVERY 30 DAYS   cyanocobalamin 1000 MCG/ML injection Commonly known  as: VITAMIN B12 INJECT EVERY 30 (THIRTY) DAYS AS DIRECTED   denosumab 60 MG/ML Sosy injection Commonly known as: PROLIA Inject 60 mg into the skin every 6 (six) months.   diclofenac sodium 1 % Gel Commonly known as: VOLTAREN Apply 1 application topically 4 (four) times daily as needed (pain.).   dicyclomine 20 MG tablet Commonly known as: BENTYL Take 1 tablet (20 mg total) by mouth 3 (three) times daily as needed for spasms (abdominal pain).   esomeprazole 40 MG capsule Commonly known as: NEXIUM Take 40 mg by mouth daily at 12 noon.   fluticasone 50 MCG/ACT nasal spray Commonly known as: FLONASE Place 2 sprays into both nostrils 2 (two) times daily.   gabapentin 800 MG tablet Commonly known as: NEURONTIN Take 800 mg by mouth 4 (four) times daily.   HYDROcodone-acetaminophen 7.5-325 MG tablet Commonly known as: NORCO Take 1 tablet by mouth every 4 (four) hours as needed for severe pain (pain score 7-10). Replaces: HYDROcodone-acetaminophen 5-325 MG tablet   hydrOXYzine 25 MG tablet Commonly known as: ATARAX Take 25 mg by mouth daily as needed for anxiety.   levothyroxine 25 MCG tablet Commonly known as: SYNTHROID Take 1 tablet (25 mcg total) by mouth at bedtime.   linaclotide 290 MCG Caps capsule Commonly known as: LINZESS Take 290 mcg by mouth daily.   meclizine 12.5 MG tablet Commonly known as: ANTIVERT Take 12.5 mg by mouth daily.   methocarbamol 500 MG tablet Commonly known as: ROBAXIN Take 1 tablet (500 mg total) by mouth every 6 (six) hours as needed for muscle spasms (muscle pain).   naloxone 0.4 MG/ML injection Commonly known as: NARCAN Inject 0.4 mg into the skin as needed.   Ozempic (0.25 or 0.5 MG/DOSE) 2 MG/3ML Sopn Generic drug: Semaglutide(0.25 or 0.5MG /DOS) Inject 0.5 mg into the skin every Friday. What changed: when to take this   polyethylene glycol powder 17 GM/SCOOP powder Commonly known as: GLYCOLAX/MIRALAX Take 17 g by mouth 2  (two) times daily. What changed: how much to take   rosuvastatin 40 MG tablet Commonly known as: CRESTOR Take 40 mg by mouth daily.   senna 8.6 MG tablet Commonly known as: SENOKOT Take 4 tablets by mouth 2 (two) times daily.   Slow Fe 142 (45 Fe) MG Tbcr tablet Generic drug: ferrous sulfate ER Take 1 tablet by mouth every Monday, Wednesday, and Friday.   Syringe/Needle (Disp) 25G X 1" 1 ML Misc 1 mL by Does not apply route every 30 (thirty) days.   vitamin C 1000 MG tablet Take 1,000 mg by mouth every Monday, Wednesday, and Friday.               Discharge Care Instructions  (From admission, onward)  Start     Ordered   06/28/23 0000  Change dressing       Comments: Maintain surgical dressing until follow up in the clinic. If the edges start to pull up, may reinforce with tape. If the dressing is no longer working, may remove and cover with gauze and tape, but must keep the area dry and clean.  Call with any questions or concerns.   06/28/23 0823            Diagnostic Studies: DG Pelvis Portable Result Date: 06/27/2023 CLINICAL DATA:  Post total hip arthroplasty. EXAM: PORTABLE PELVIS 1-2 VIEWS COMPARISON:  Fluoroscopy 06/27/2023 FINDINGS: Postoperative right total hip arthroplasty using non cemented components with a single fixation screw in the acetabular component. Alignment is normal. No evidence of acute fracture or dislocation. Arthroplasty components appear well seated. Soft tissue gas is present consistent with recent surgery. Pelvis and left hip appear otherwise intact. Surgical clips in the pelvis consistent with tubal ligations. IMPRESSION: Postoperative left total hip arthroplasty. Components appear well seated. Electronically Signed   By: Burman Nieves M.D.   On: 06/27/2023 19:40   DG HIP UNILAT WITH PELVIS 1V RIGHT Result Date: 06/27/2023 CLINICAL DATA:  Total right hip arthroplasty. Intraoperative fluoroscopy. EXAM: DG HIP (WITH OR WITHOUT  PELVIS) 1V RIGHT COMPARISON:  None Available. FINDINGS: Images were performed intraoperatively without the presence of a radiologist. The patient is undergoing total right hip arthroplasty. No hardware complication is seen. Total fluoroscopy images: 7 Total fluoroscopy time: 6 seconds Total dose: Radiation Exposure Index (as provided by the fluoroscopic device): 0.788 mGy air Kerma Please see intraoperative findings for further detail. IMPRESSION: Intraoperative fluoroscopy for total right hip arthroplasty. Electronically Signed   By: Neita Garnet M.D.   On: 06/27/2023 16:54   DG C-Arm 1-60 Min-No Report Result Date: 06/27/2023 Fluoroscopy was utilized by the requesting physician.  No radiographic interpretation.    Disposition: Discharge disposition: 01-Home or Self Care       Discharge Instructions     Call MD / Call 911   Complete by: As directed    If you experience chest pain or shortness of breath, CALL 911 and be transported to the hospital emergency room.  If you develope a fever above 101 F, pus (white drainage) or increased drainage or redness at the wound, or calf pain, call your surgeon's office.   Change dressing   Complete by: As directed    Maintain surgical dressing until follow up in the clinic. If the edges start to pull up, may reinforce with tape. If the dressing is no longer working, may remove and cover with gauze and tape, but must keep the area dry and clean.  Call with any questions or concerns.   Constipation Prevention   Complete by: As directed    Drink plenty of fluids.  Prune juice may be helpful.  You may use a stool softener, such as Colace (over the counter) 100 mg twice a day.  Use MiraLax (over the counter) for constipation as needed.   Diet - low sodium heart healthy   Complete by: As directed    Increase activity slowly as tolerated   Complete by: As directed    Weight bearing as tolerated with assist device (walker, cane, etc) as directed, use it as  long as suggested by your surgeon or therapist, typically at least 4-6 weeks.   Post-operative opioid taper instructions:   Complete by: As directed    POST-OPERATIVE OPIOID TAPER INSTRUCTIONS:  It is important to wean off of your opioid medication as soon as possible. If you do not need pain medication after your surgery it is ok to stop day one. Opioids include: Codeine, Hydrocodone(Norco, Vicodin), Oxycodone(Percocet, oxycontin) and hydromorphone amongst others.  Long term and even short term use of opiods can cause: Increased pain response Dependence Constipation Depression Respiratory depression And more.  Withdrawal symptoms can include Flu like symptoms Nausea, vomiting And more Techniques to manage these symptoms Hydrate well Eat regular healthy meals Stay active Use relaxation techniques(deep breathing, meditating, yoga) Do Not substitute Alcohol to help with tapering If you have been on opioids for less than two weeks and do not have pain than it is ok to stop all together.  Plan to wean off of opioids This plan should start within one week post op of your joint replacement. Maintain the same interval or time between taking each dose and first decrease the dose.  Cut the total daily intake of opioids by one tablet each day Next start to increase the time between doses. The last dose that should be eliminated is the evening dose.      TED hose   Complete by: As directed    Use stockings (TED hose) for 2 weeks on both leg(s).  You may remove them at night for sleeping.        Follow-up Information     Durene Romans, MD. Schedule an appointment as soon as possible for a visit in 2 week(s).   Specialty: Orthopedic Surgery Contact information: [address] [place] [address] [phone]                  Signed: Cassandria Anger 07/04/2023, 4:14 PM

## 2023-07-04 NOTE — Telephone Encounter (Signed)
 New Encounter has been or will be created for follow up. For additional info see Pharmacy Prior Auth telephone encounter from 07/04/23.

## 2023-07-07 ENCOUNTER — Other Ambulatory Visit: Payer: Self-pay

## 2023-07-07 ENCOUNTER — Other Ambulatory Visit (HOSPITAL_COMMUNITY): Payer: Self-pay

## 2023-07-07 ENCOUNTER — Other Ambulatory Visit: Payer: Self-pay | Admitting: Family Medicine

## 2023-07-07 MED ORDER — FLUTICASONE PROPIONATE 50 MCG/ACT NA SUSP
2.0000 | Freq: Two times a day (BID) | NASAL | 1 refills | Status: DC
  Filled 2023-07-07 – 2023-07-17 (×2): qty 16, 15d supply, fill #0
  Filled 2023-07-27 – 2023-07-29 (×2): qty 16, 15d supply, fill #1

## 2023-07-07 MED ORDER — OZEMPIC (0.25 OR 0.5 MG/DOSE) 2 MG/3ML ~~LOC~~ SOPN
0.5000 mg | PEN_INJECTOR | SUBCUTANEOUS | 1 refills | Status: DC
  Filled 2023-07-07: qty 6, 56d supply, fill #0
  Filled 2023-08-21: qty 6, 56d supply, fill #1

## 2023-07-08 ENCOUNTER — Other Ambulatory Visit (HOSPITAL_COMMUNITY): Payer: Self-pay

## 2023-07-10 ENCOUNTER — Other Ambulatory Visit: Payer: Self-pay

## 2023-07-10 ENCOUNTER — Other Ambulatory Visit (HOSPITAL_COMMUNITY): Payer: Self-pay

## 2023-07-10 MED ORDER — LEVOTHYROXINE SODIUM 25 MCG PO TABS
25.0000 ug | ORAL_TABLET | Freq: Every day | ORAL | 0 refills | Status: DC
  Filled 2023-07-10 – 2023-08-21 (×2): qty 90, 90d supply, fill #0

## 2023-07-11 ENCOUNTER — Other Ambulatory Visit (HOSPITAL_COMMUNITY): Payer: Self-pay

## 2023-07-14 ENCOUNTER — Other Ambulatory Visit (HOSPITAL_COMMUNITY): Payer: Self-pay

## 2023-07-14 ENCOUNTER — Other Ambulatory Visit: Payer: Self-pay

## 2023-07-14 MED ORDER — FREESTYLE LIBRE 3 PLUS SENSOR MISC
3 refills | Status: AC
Start: 2023-07-14 — End: ?
  Filled 2023-07-14 – 2023-07-17 (×2): qty 6, 90d supply, fill #0
  Filled 2023-08-11 – 2023-10-04 (×4): qty 6, 90d supply, fill #1
  Filled 2023-12-25: qty 6, 90d supply, fill #2
  Filled 2024-03-17: qty 6, 90d supply, fill #3

## 2023-07-16 ENCOUNTER — Other Ambulatory Visit: Payer: Self-pay | Admitting: Family Medicine

## 2023-07-17 ENCOUNTER — Other Ambulatory Visit: Payer: Self-pay

## 2023-07-17 ENCOUNTER — Other Ambulatory Visit (HOSPITAL_COMMUNITY): Payer: Self-pay

## 2023-07-18 ENCOUNTER — Other Ambulatory Visit (HOSPITAL_COMMUNITY): Payer: Self-pay

## 2023-07-18 MED ORDER — GABAPENTIN 800 MG PO TABS
800.0000 mg | ORAL_TABLET | Freq: Four times a day (QID) | ORAL | 1 refills | Status: DC
  Filled 2023-07-18 – 2023-07-27 (×2): qty 120, 30d supply, fill #0
  Filled 2023-08-09 – 2023-08-21 (×2): qty 120, 30d supply, fill #1

## 2023-07-27 ENCOUNTER — Other Ambulatory Visit (HOSPITAL_COMMUNITY): Payer: Self-pay

## 2023-07-27 ENCOUNTER — Other Ambulatory Visit: Payer: Self-pay

## 2023-08-02 NOTE — Progress Notes (Unsigned)
   Established Patient Office Visit  Subjective   Patient ID: Kayla Mayo, female    DOB: Oct 01, 1955  Age: 68 y.o. MRN: [id number]  No chief complaint on file.   HPI Patient presents today for medication management. Has been having elevated blood sugar at home, up to 260s. Had right hip replacement at the end of February. Currently taking Ozempic 0.5 mg once weekly for diabetes, has been well-controlled on this for *** Reports compliance with medication regimen. Not fasting today. Denies other concerns. Medical history as outlined below.  ROS Per HPI    Objective:     LMP 05/02/2006   Physical Exam Vitals and nursing note reviewed.  Constitutional:      General: She is not in acute distress.    Appearance: Normal appearance. She is normal weight.  HENT:     Head: Normocephalic and atraumatic.     Right Ear: External ear normal.     Left Ear: External ear normal.     Nose: Nose normal.     Mouth/Throat:     Mouth: Mucous membranes are moist.     Pharynx: Oropharynx is clear.  Eyes:     Extraocular Movements: Extraocular movements intact.     Pupils: Pupils are equal, round, and reactive to light.  Cardiovascular:     Rate and Rhythm: Normal rate and regular rhythm.     Pulses: Normal pulses.     Heart sounds: Normal heart sounds.  Pulmonary:     Effort: Pulmonary effort is normal. No respiratory distress.     Breath sounds: Normal breath sounds. No wheezing, rhonchi or rales.  Musculoskeletal:        General: Normal range of motion.     Cervical back: Normal range of motion.     Right lower leg: No edema.     Left lower leg: No edema.  Lymphadenopathy:     Cervical: No cervical adenopathy.  Neurological:     General: No focal deficit present.     Mental Status: She is alert and oriented to person, place, and time.  Psychiatric:        Mood and Affect: Mood normal.        Thought Content: Thought content normal.    No results found for any visits  on 08/04/23.   The ASCVD Risk score (Arnett DK, et al., 2019) failed to calculate for the following reasons:   Cannot find a previous HDL lab    Assessment & Plan:   There are no diagnoses linked to this encounter.   No follow-ups on file.    Sherald Barge, FNP

## 2023-08-04 ENCOUNTER — Ambulatory Visit (INDEPENDENT_AMBULATORY_CARE_PROVIDER_SITE_OTHER): Admitting: Family Medicine

## 2023-08-04 ENCOUNTER — Encounter: Payer: Self-pay | Admitting: Family Medicine

## 2023-08-04 VITALS — BP 122/80 | HR 71 | Temp 98.4°F | Ht 65.0 in | Wt 162.4 lb

## 2023-08-04 DIAGNOSIS — Z7984 Long term (current) use of oral hypoglycemic drugs: Secondary | ICD-10-CM

## 2023-08-04 DIAGNOSIS — Z79899 Other long term (current) drug therapy: Secondary | ICD-10-CM | POA: Diagnosis not present

## 2023-08-04 DIAGNOSIS — E1143 Type 2 diabetes mellitus with diabetic autonomic (poly)neuropathy: Secondary | ICD-10-CM

## 2023-08-04 DIAGNOSIS — D519 Vitamin B12 deficiency anemia, unspecified: Secondary | ICD-10-CM | POA: Diagnosis not present

## 2023-08-04 LAB — CBC WITH DIFFERENTIAL/PLATELET
Basophils Absolute: 0 10*3/uL (ref 0.0–0.1)
Basophils Relative: 1.2 % (ref 0.0–3.0)
Eosinophils Absolute: 0.2 10*3/uL (ref 0.0–0.7)
Eosinophils Relative: 5.4 % — ABNORMAL HIGH (ref 0.0–5.0)
HCT: 38.3 % (ref 36.0–46.0)
Hemoglobin: 12.5 g/dL (ref 12.0–15.0)
Lymphocytes Relative: 30.5 % (ref 12.0–46.0)
Lymphs Abs: 1.1 10*3/uL (ref 0.7–4.0)
MCHC: 32.8 g/dL (ref 30.0–36.0)
MCV: 97.9 fl (ref 78.0–100.0)
Monocytes Absolute: 0.2 10*3/uL (ref 0.1–1.0)
Monocytes Relative: 6.1 % (ref 3.0–12.0)
Neutro Abs: 2.1 10*3/uL (ref 1.4–7.7)
Neutrophils Relative %: 56.8 % (ref 43.0–77.0)
Platelets: 220 10*3/uL (ref 150.0–400.0)
RBC: 3.91 Mil/uL (ref 3.87–5.11)
RDW: 14.5 % (ref 11.5–15.5)
WBC: 3.6 10*3/uL — ABNORMAL LOW (ref 4.0–10.5)

## 2023-08-04 LAB — COMPREHENSIVE METABOLIC PANEL WITH GFR
ALT: 22 U/L (ref 0–35)
AST: 23 U/L (ref 0–37)
Albumin: 4.4 g/dL (ref 3.5–5.2)
Alkaline Phosphatase: 71 U/L (ref 39–117)
BUN: 14 mg/dL (ref 6–23)
CO2: 28 meq/L (ref 19–32)
Calcium: 8.9 mg/dL (ref 8.4–10.5)
Chloride: 103 meq/L (ref 96–112)
Creatinine, Ser: 1.01 mg/dL (ref 0.40–1.20)
GFR: 57.6 mL/min — ABNORMAL LOW (ref >60.00)
Glucose, Bld: 112 mg/dL — ABNORMAL HIGH (ref 70–99)
Potassium: 3.9 meq/L (ref 3.5–5.1)
Sodium: 139 meq/L (ref 135–145)
Total Bilirubin: 0.7 mg/dL (ref 0.2–1.2)
Total Protein: 7.1 g/dL (ref 6.0–8.3)

## 2023-08-04 NOTE — Patient Instructions (Addendum)
 Glucose Revolution by Ruthell Rummage  Try balancing proteins and carbohydrates  American Diabetes Association website has lots of ideas for foods to help avoid glucose spikes  Let me know in about 2 weeks what your blood sugars are looking like

## 2023-08-08 ENCOUNTER — Inpatient Hospital Stay: Payer: PRIVATE HEALTH INSURANCE | Attending: Hematology

## 2023-08-08 ENCOUNTER — Encounter: Payer: Self-pay | Admitting: Family Medicine

## 2023-08-08 DIAGNOSIS — K912 Postsurgical malabsorption, not elsewhere classified: Secondary | ICD-10-CM | POA: Diagnosis not present

## 2023-08-08 DIAGNOSIS — D508 Other iron deficiency anemias: Secondary | ICD-10-CM | POA: Insufficient documentation

## 2023-08-08 DIAGNOSIS — D510 Vitamin B12 deficiency anemia due to intrinsic factor deficiency: Secondary | ICD-10-CM

## 2023-08-08 DIAGNOSIS — D509 Iron deficiency anemia, unspecified: Secondary | ICD-10-CM

## 2023-08-08 LAB — FERRITIN: Ferritin: 244 ng/mL (ref 11–307)

## 2023-08-08 LAB — CBC WITH DIFFERENTIAL/PLATELET
Abs Immature Granulocytes: 0.01 10*3/uL (ref 0.00–0.07)
Basophils Absolute: 0 10*3/uL (ref 0.0–0.1)
Basophils Relative: 1 %
Eosinophils Absolute: 0.2 10*3/uL (ref 0.0–0.5)
Eosinophils Relative: 5 %
HCT: 39.5 % (ref 36.0–46.0)
Hemoglobin: 12.3 g/dL (ref 12.0–15.0)
Immature Granulocytes: 0 %
Lymphocytes Relative: 25 %
Lymphs Abs: 1 10*3/uL (ref 0.7–4.0)
MCH: 31.2 pg (ref 26.0–34.0)
MCHC: 31.1 g/dL (ref 30.0–36.0)
MCV: 100.3 fL — ABNORMAL HIGH (ref 80.0–100.0)
Monocytes Absolute: 0.2 10*3/uL (ref 0.1–1.0)
Monocytes Relative: 5 %
Neutro Abs: 2.5 10*3/uL (ref 1.7–7.7)
Neutrophils Relative %: 64 %
Platelets: 216 10*3/uL (ref 150–400)
RBC: 3.94 MIL/uL (ref 3.87–5.11)
RDW: 13.3 % (ref 11.5–15.5)
WBC: 3.9 10*3/uL — ABNORMAL LOW (ref 4.0–10.5)
nRBC: 0 % (ref 0.0–0.2)

## 2023-08-08 LAB — VITAMIN B12: Vitamin B-12: 445 pg/mL (ref 180–914)

## 2023-08-09 ENCOUNTER — Other Ambulatory Visit (HOSPITAL_COMMUNITY): Payer: Self-pay

## 2023-08-09 ENCOUNTER — Other Ambulatory Visit: Payer: Self-pay

## 2023-08-09 ENCOUNTER — Other Ambulatory Visit: Payer: Self-pay | Admitting: Family Medicine

## 2023-08-09 DIAGNOSIS — Z471 Aftercare following joint replacement surgery: Secondary | ICD-10-CM | POA: Diagnosis not present

## 2023-08-09 DIAGNOSIS — Z96641 Presence of right artificial hip joint: Secondary | ICD-10-CM | POA: Diagnosis not present

## 2023-08-09 MED ORDER — FLUTICASONE PROPIONATE 50 MCG/ACT NA SUSP
2.0000 | Freq: Two times a day (BID) | NASAL | 1 refills | Status: DC
  Filled 2023-08-09: qty 16, 15d supply, fill #0
  Filled 2023-08-21: qty 16, 30d supply, fill #0
  Filled 2023-09-16: qty 16, 30d supply, fill #1

## 2023-08-11 ENCOUNTER — Other Ambulatory Visit (HOSPITAL_COMMUNITY): Payer: Self-pay

## 2023-08-14 ENCOUNTER — Encounter: Payer: Self-pay | Admitting: Oncology

## 2023-08-18 ENCOUNTER — Encounter: Payer: Self-pay | Admitting: Hematology

## 2023-08-21 ENCOUNTER — Other Ambulatory Visit: Payer: Self-pay

## 2023-08-22 ENCOUNTER — Other Ambulatory Visit: Payer: Self-pay

## 2023-08-22 ENCOUNTER — Other Ambulatory Visit (HOSPITAL_COMMUNITY): Payer: Self-pay

## 2023-08-22 ENCOUNTER — Encounter: Payer: Self-pay | Admitting: Pharmacist

## 2023-08-22 ENCOUNTER — Encounter: Payer: Self-pay | Admitting: Oncology

## 2023-08-22 MED ORDER — CYANOCOBALAMIN 1000 MCG/ML IJ SOLN
1000.0000 ug | INTRAMUSCULAR | 1 refills | Status: DC
  Filled 2023-08-22: qty 3, 90d supply, fill #0
  Filled 2023-09-16 – 2023-11-16 (×4): qty 3, 90d supply, fill #1

## 2023-08-23 DIAGNOSIS — G894 Chronic pain syndrome: Secondary | ICD-10-CM | POA: Diagnosis not present

## 2023-08-23 DIAGNOSIS — Z79891 Long term (current) use of opiate analgesic: Secondary | ICD-10-CM | POA: Diagnosis not present

## 2023-08-28 DIAGNOSIS — E119 Type 2 diabetes mellitus without complications: Secondary | ICD-10-CM | POA: Diagnosis not present

## 2023-08-31 DIAGNOSIS — K5903 Drug induced constipation: Secondary | ICD-10-CM | POA: Diagnosis not present

## 2023-08-31 DIAGNOSIS — T402X5A Adverse effect of other opioids, initial encounter: Secondary | ICD-10-CM | POA: Diagnosis not present

## 2023-08-31 DIAGNOSIS — Z79899 Other long term (current) drug therapy: Secondary | ICD-10-CM | POA: Diagnosis not present

## 2023-08-31 DIAGNOSIS — K219 Gastro-esophageal reflux disease without esophagitis: Secondary | ICD-10-CM | POA: Diagnosis not present

## 2023-09-04 ENCOUNTER — Ambulatory Visit (INDEPENDENT_AMBULATORY_CARE_PROVIDER_SITE_OTHER)

## 2023-09-04 VITALS — Ht 65.0 in | Wt 162.0 lb

## 2023-09-04 DIAGNOSIS — E1143 Type 2 diabetes mellitus with diabetic autonomic (poly)neuropathy: Secondary | ICD-10-CM | POA: Diagnosis not present

## 2023-09-04 DIAGNOSIS — Z0000 Encounter for general adult medical examination without abnormal findings: Secondary | ICD-10-CM | POA: Diagnosis not present

## 2023-09-04 DIAGNOSIS — Z1159 Encounter for screening for other viral diseases: Secondary | ICD-10-CM

## 2023-09-04 NOTE — Patient Instructions (Signed)
 Ms. Krynski , Thank you for taking time to come for your Medicare Wellness Visit. I appreciate your ongoing commitment to your health goals. Please review the following plan we discussed and let me know if I can assist you in the future.   Referrals/Orders/Follow-Ups/Clinician Recommendations: It was nice talking with you today.    This is a list of the screening recommended for you and due dates:  Health Maintenance  Topic Date Due   Complete foot exam   Never done   Eye exam for diabetics  Never done   Yearly kidney health urinalysis for diabetes  Never done   Hepatitis C Screening  Never done   Pneumonia Vaccine (1 of 2 - PCV) Never done   Medicare Annual Wellness Visit  02/04/2022   COVID-19 Vaccine (3 - 2024-25 season) 07/24/2023   Flu Shot  12/01/2023   Hemoglobin A1C  12/12/2023   Yearly kidney function blood test for diabetes  08/03/2024   Mammogram  08/15/2024   Colon Cancer Screening  09/30/2024   DTaP/Tdap/Td vaccine (3 - Td or Tdap) 03/31/2027   DEXA scan (bone density measurement)  Completed   Zoster (Shingles) Vaccine  Completed   HPV Vaccine  Aged Out   Meningitis B Vaccine  Aged Out    Advanced directives: (In Chart) A copy of your advanced directives are scanned into your chart should your provider ever need it.  Next Medicare Annual Wellness Visit scheduled for next year: Yes  Have you seen your provider in the last 6 months (3 months if uncontrolled diabetes)? Yes

## 2023-09-04 NOTE — Progress Notes (Signed)
 Subjective:   Kayla Mayo is a 68 y.o. who presents for a Medicare Wellness preventive visit.  Visit Complete: Virtual I connected with  Kayla Mayo on 09/04/23 by a audio enabled telemedicine application and verified that I am speaking with the correct person using two identifiers.  Patient Location: Home  Provider Location: Home Office  I discussed the limitations of evaluation and management by telemedicine. The patient expressed understanding and agreed to proceed.  Vital Signs: Because this visit was a virtual/telehealth visit, some criteria may be missing or patient reported. Any vitals not documented were not able to be obtained and vitals that have been documented are patient reported.  VideoDeclined- This patient declined Librarian, academic. Therefore the visit was completed with audio only.  Persons Participating in Visit: Patient.  AWV Questionnaire: Yes: Patient Medicare AWV questionnaire was completed by the patient on 09/03/2023; I have confirmed that all information answered by patient is correct and no changes since this date.  Cardiac Risk Factors include: advanced age (>48men, >15 women);hypertension;diabetes mellitus;dyslipidemia     Objective:    Today's Vitals   09/03/23 1153 09/04/23 1314  Weight:  162 lb (73.5 kg)  Height:  5\' 5"  (1.651 m)  PainSc: 8     Body mass index is 26.96 kg/m.     09/04/2023    1:21 PM 06/27/2023   12:14 PM 06/14/2023   11:37 AM 09/26/2019   11:37 AM 05/13/2019    8:39 PM 05/07/2019    9:10 AM 03/04/2019    5:00 PM  Advanced Directives  Does Patient Have a Medical Advance Directive? Yes Yes Yes Yes Yes Yes Yes  Type of Estate agent of [place];Living will Healthcare Power of [place];Living will Healthcare Power of [place];Living will  Living will Living will Healthcare Power of [place];Living will  Does patient want to make changes to medical advance directive?  No - Patient declined No - Patient declined No - Patient declined  No - Patient declined  No - Patient declined  Copy of Healthcare Power of Attorney in Chart? Yes - validated most recent copy scanned in chart (See row information) Yes - validated most recent copy scanned in chart (See row information) Yes - validated most recent copy scanned in chart (See row information)    No - copy requested    Current Medications (verified) Outpatient Encounter Medications as of 09/04/2023  Medication Sig   ACCU-CHEK FASTCLIX LANCETS MISC Use daily as directed for Dx: 250.00   B-D INSULIN  SYRINGE 1CC/25GX1" 25G X 1" 1 ML MISC USE AS DIRECTED EVERY 30 DAYS   Continuous Glucose Sensor (FREESTYLE LIBRE 3 PLUS SENSOR) MISC Change sensor every 15 days.   cyanocobalamin  (VITAMIN B12) 1000 MCG/ML injection Inject 1 mL (1,000 mcg total) into the muscle every 30 (thirty) days.   denosumab (PROLIA) 60 MG/ML SOSY injection Inject 60 mg into the skin every 6 (six) months.   diclofenac sodium (VOLTAREN) 1 % GEL Apply 1 application topically 4 (four) times daily as needed (pain.).    dicyclomine  (BENTYL ) 20 MG tablet Take 1 tablet (20 mg total) by mouth 3 (three) times daily as needed for spasms (abdominal pain).   esomeprazole (NEXIUM) 40 MG capsule Take 40 mg by mouth daily at 12 noon.   ferrous sulfate  ER (SLOW FE) 142 (45 Fe) MG TBCR tablet Take 1 tablet by mouth every Monday, Wednesday, and Friday.   fluticasone  (FLONASE ) 50 MCG/ACT nasal spray Place 2 sprays into  both nostrils 2 (two) times daily.   gabapentin  (NEURONTIN ) 800 MG tablet Take 1 tablet (800 mg total) by mouth 4 (four) times daily.   HYDROcodone -acetaminophen  (NORCO) 10-325 MG tablet Take 1 tablet by mouth.   hydrOXYzine  (ATARAX /VISTARIL ) 25 MG tablet Take 25 mg by mouth daily as needed for anxiety.   levothyroxine  (SYNTHROID ) 25 MCG tablet Take 1 tablet (25 mcg total) by mouth at bedtime.   Linaclotide  (LINZESS ) 290 MCG CAPS capsule Take 290 mcg by  mouth daily.    meclizine  (ANTIVERT ) 12.5 MG tablet Take 12.5 mg by mouth daily.   methocarbamol  (ROBAXIN ) 500 MG tablet Take 1 tablet (500 mg total) by mouth every 6 (six) hours as needed for muscle spasms (muscle pain).   naloxone  (NARCAN ) 0.4 MG/ML injection Inject 0.4 mg into the skin as needed.   polyethylene glycol powder (GLYCOLAX /MIRALAX ) powder Take 17 g by mouth 2 (two) times daily. (Patient taking differently: Take 34 g by mouth 2 (two) times daily.)   rosuvastatin  (CRESTOR ) 40 MG tablet Take 40 mg by mouth daily.   Semaglutide ,0.25 or 0.5MG /DOS, (OZEMPIC , 0.25 OR 0.5 MG/DOSE,) 2 MG/3ML SOPN Inject 0.5 mg into the skin every Friday.   senna (SENOKOT) 8.6 MG tablet Take 4 tablets by mouth 2 (two) times daily.   Syringe/Needle, Disp, 25G X 1" 1 ML MISC 1 mL by Does not apply route every 30 (thirty) days.   Ascorbic Acid  (VITAMIN C) 1000 MG tablet Take 1,000 mg by mouth every Monday, Wednesday, and Friday.   HYDROcodone -acetaminophen  (NORCO) 7.5-325 MG tablet Take 1 tablet by mouth every 4 (four) hours as needed for severe pain (pain score 7-10).   [DISCONTINUED] traZODone  (DESYREL ) 100 MG tablet 1& 1/2 TABLETS BY MOUTH AT BEDTIME   Facility-Administered Encounter Medications as of 09/04/2023  Medication   cyanocobalamin  ((VITAMIN B-12)) injection 1,000 mcg    Allergies (verified) Morphine  and codeine, Blood-group specific substance, Codeine, Enalapril , Lactose intolerance (gi), Oxycodone , Peanut-containing drug products, and Simvastatin    History: Past Medical History:  Diagnosis Date   Anal fissure    Anemia 03/17/2011   Anxiety    Arthritis    Bursitis of hip    Chronic headaches    Colon polyps    Diabetes mellitus    type 2    Esophageal spasm    Fibromyalgia    neuropathy right arm left leg and feet   Gallstones    GERD (gastroesophageal reflux disease)    Hyperlipidemia    Hypertension    no meds in 2 years   Hypothyroidism    IBS (irritable bowel syndrome)     Neuropathy    Obesity    Osteopenia 07/2011   t score -1.5 FRAX 2.1%/0%   PONV (postoperative nausea and vomiting)    none with the most recent surgeries    Status post cardiac catheterization 2007   [place] unsure which hospital it was ; reports it was ruled panic attack as the cause    Vertigo    Occurs 6 times a month;  reports last occurrence was 04/2019   Past Surgical History:  Procedure Laterality Date   APPENDECTOMY     bilareral knee scopes     CARPAL TUNNEL RELEASE     CHOLECYSTECTOMY     GASTRIC BYPASS     left shouler surgery     lower back surgery     TOTAL HIP ARTHROPLASTY Right 06/27/2023   Procedure: TOTAL HIP ARTHROPLASTY ANTERIOR APPROACH;  Surgeon: Claiborne Crew, MD;  Location: [facility];  Service: Orthopedics;  Laterality: Right;   TOTAL KNEE ARTHROPLASTY Right 03/04/2019   Procedure: RIGH TOTAL KNEE ARTHROPLASTY;  Surgeon: Wendolyn Hamburger, MD;  Location: [facility];  Service: Orthopedics;  Laterality: Right;   TOTAL KNEE ARTHROPLASTY Left 05/13/2019   Procedure: LEFT TOTAL KNEE ARTHROPLASTY;  Surgeon: Wendolyn Hamburger, MD;  Location: [facility];  Service: Orthopedics;  Laterality: Left;   TUBAL LIGATION     Family History  Problem Relation Age of Onset   Diabetes Mother    Stroke Mother    Heart disease Mother    Hypertension Mother    Hyperlipidemia Mother    Anxiety disorder Mother    Diabetes Brother    Alcohol abuse Brother    Drug abuse Brother    Alcohol abuse Father    Cancer Father        Stomach   Diabetes Maternal Aunt    Diabetes Maternal Uncle    Alcohol abuse Maternal Uncle    Diabetes Maternal Grandmother    Dementia Maternal Grandmother    Depression Maternal Grandmother    Alcohol abuse Paternal Uncle    Cancer Paternal Uncle        Throat   Alcohol abuse Maternal Grandfather    Alcohol abuse Paternal Grandfather    Alcohol abuse Maternal Uncle    Alcohol abuse Paternal Uncle    Alcohol abuse Paternal Uncle    Cancer Paternal Aunt         Leukemia   Social History   Socioeconomic History   Marital status: Married    Spouse name: Aimee Houseman   Number of children: 2   Years of education: 14   Highest education level: Associate degree: occupational, Scientist, product/process development, or vocational program  Occupational History   Occupation: Disabled    Employer: UNEMPLOYED  Tobacco Use   Smoking status: Never   Smokeless tobacco: Never  Vaping Use   Vaping status: Never Used  Substance and Sexual Activity   Alcohol use: No   Drug use: No   Sexual activity: Not Currently    Partners: Male    Birth control/protection: Post-menopausal, Surgical    Comment: Tubal lig  Other Topics Concern   Not on file  Social History Narrative   Denies caffeine use      Lives with husband and is his caregiver.     Social Drivers of Corporate investment banker Strain: High Risk (09/04/2023)   Overall Financial Resource Strain (CARDIA)    Difficulty of Paying Living Expenses: Hard  Food Insecurity: No Food Insecurity (09/04/2023)   Hunger Vital Sign    Worried About Running Out of Food in the Last Year: Never true    Ran Out of Food in the Last Year: Never true  Recent Concern: Food Insecurity - Food Insecurity Present (08/28/2023)   Received from [facility]   Hunger Vital Sign    Worried About Running Out of Food in the Last Year: Sometimes true    Ran Out of Food in the Last Year: Sometimes true  Transportation Needs: No Transportation Needs (09/04/2023)   PRAPARE - Administrator, Civil Service (Medical): No    Lack of Transportation (Non-Medical): No  Physical Activity: Inactive (09/04/2023)   Exercise Vital Sign    Days of Exercise per Week: 0 days    Minutes of Exercise per Session: 0 min  Stress: No Stress Concern Present (09/04/2023)   [employer] of Occupational Health - Occupational Stress Questionnaire  Feeling of Stress : Not at all  Social Connections: Socially Integrated (09/04/2023)   Social Connection and Isolation Panel  [NHANES]    Frequency of Communication with Friends and Family: More than three times a week    Frequency of Social Gatherings with Friends and Family: Patient declined    Attends Religious Services: More than 4 times per year    Active Member of [employer] or Organizations: Yes    Attends Banker Meetings: Never    Marital Status: Married    Tobacco Counseling Counseling given: Not Answered    Clinical Intake:  Pre-visit preparation completed: Yes  Pain : 0-10 Pain Score: 8  Pain Type: Chronic pain Pain Location:  (all over) Pain Descriptors / Indicators: Aching, Discomfort Pain Frequency: Constant Pain Relieving Factors: Hydrocodone   Pain Relieving Factors: Hydrocodone   BMI - recorded: 26.96 Nutritional Status: BMI 25 -29 Overweight Diabetes: Yes CBG done?: Yes (90 per pt) CBG resulted in Enter/ Edit results?: No Did pt. bring in CBG monitor from home?: No  Lab Results  Component Value Date   HGBA1C 5.7 (H) 06/14/2023   HGBA1C 6.3 (H) 05/14/2019   HGBA1C 6.3 (H) 05/08/2019     How often do you need to have someone help you when you read instructions, pamphlets, or other written materials from your doctor or pharmacy?: 1 - Never  Interpreter Needed?: No  Information entered by :: Oluwaferanmi Wain,RMA   Activities of Daily Living     09/03/2023   11:53 AM 06/28/2023    7:00 AM  In your present state of health, do you have any difficulty performing the following activities:  Hearing? 0 0  Vision? 0 0  Difficulty concentrating or making decisions? 0 0  Walking or climbing stairs? 1   Dressing or bathing? 0   Doing errands, shopping? 0   Preparing Food and eating ? N   Using the Toilet? N   In the past six months, have you accidently leaked urine? Y   Do you have problems with loss of bowel control? N   Managing your Medications? N   Managing your Finances? N   Housekeeping or managing your Housekeeping? N     Patient Care Team: Wellington Half, FNP as PCP - General (Family Medicine) [place], Heather, OD (Optometry)  Indicate any recent Medical Services you may have received from other than [facility] providers in the past year (date may be approximate).     Assessment:   This is a routine wellness examination for [facility].  Hearing/Vision screen Hearing Screening - Comments:: Denies hearing difficulties   Vision Screening - Comments:: Wears eyeglasses   Goals Addressed             This Visit's Progress    Patient Stated       Not at this time.       Depression Screen     09/04/2023    1:24 PM 04/01/2016   11:50 AM 12/28/2015    2:00 PM  PHQ 2/9 Scores  PHQ - 2 Score 0    PHQ- 9 Score 0       Information is confidential and restricted. Go to Review Flowsheets to unlock data.    Fall Risk     09/03/2023   11:53 AM 03/17/2014   11:53 AM  Fall Risk   Falls in the past year? 0 No  Number falls in past yr: 0   Injury with Fall? 0   Follow up Falls evaluation  completed;Falls prevention discussed     MEDICARE RISK AT HOME:  Medicare Risk at Home Any stairs in or around the home?: (Patient-Rptd) No If so, are there any without handrails?: (Patient-Rptd) No Home free of loose throw rugs in walkways, pet beds, electrical cords, etc?: (Patient-Rptd) Yes Adequate lighting in your home to reduce risk of falls?: (Patient-Rptd) Yes Life alert?: (Patient-Rptd) Yes Use of a cane, walker or w/c?: (Patient-Rptd) Yes Grab bars in the bathroom?: (Patient-Rptd) No Shower chair or bench in shower?: (Patient-Rptd) No Elevated toilet seat or a handicapped toilet?: (Patient-Rptd) Yes  TIMED UP AND GO:  Was the test performed?  No  Cognitive Function: Declined/Normal: No cognitive concerns noted by patient or family. Patient alert, oriented, able to answer questions appropriately and recall recent events. No signs of memory loss or confusion.        Immunizations Immunization History  Administered Date(s)  Administered   Influenza-Unspecified 01/30/2014, 02/02/2015   Moderna Covid-19 Fall Seasonal Vaccine 39yrs & older 01/24/2023   Td 07/03/2008   Tdap 03/30/2017    Screening Tests Health Maintenance  Topic Date Due   FOOT EXAM  Never done   OPHTHALMOLOGY EXAM  Never done   Diabetic kidney evaluation - Urine ACR  Never done   Hepatitis C Screening  Never done   Pneumonia Vaccine 32+ Years old (1 of 2 - PCV) Never done   Medicare Annual Wellness (AWV)  02/04/2022   COVID-19 Vaccine (3 - 2024-25 season) 07/24/2023   INFLUENZA VACCINE  12/01/2023   HEMOGLOBIN A1C  12/12/2023   Diabetic kidney evaluation - eGFR measurement  08/03/2024   MAMMOGRAM  08/15/2024   Colonoscopy  09/30/2024   DTaP/Tdap/Td (3 - Td or Tdap) 03/31/2027   DEXA SCAN  Completed   Zoster Vaccines- Shingrix  Completed   HPV VACCINES  Aged Out   Meningococcal B Vaccine  Aged Out    Health Maintenance  Health Maintenance Due  Topic Date Due   FOOT EXAM  Never done   OPHTHALMOLOGY EXAM  Never done   Diabetic kidney evaluation - Urine ACR  Never done   Hepatitis C Screening  Never done   Pneumonia Vaccine 28+ Years old (1 of 2 - PCV) Never done   Medicare Annual Wellness (AWV)  02/04/2022   COVID-19 Vaccine (3 - 2024-25 season) 07/24/2023   Health Maintenance Items Addressed: Diabetic Foot Exam scheduled, Hepatitis C Screening, UACR (Urine Albumin:Creatinine Ratio), See Nurse Notes  Additional Screening:  Vision Screening: Recommended annual ophthalmology exams for early detection of glaucoma and other disorders of the eye.  Dental Screening: Recommended annual dental exams for proper oral hygiene  Community Resource Referral / Chronic Care Management: CRR required this visit?  No   CCM required this visit?  No     Plan:     I have personally reviewed and noted the following in the patient's chart:   Medical and social history Use of alcohol, tobacco or illicit drugs  Current medications and  supplements including opioid prescriptions. Patient is currently taking opioid prescriptions. Information provided to patient regarding non-opioid alternatives. Patient advised to discuss non-opioid treatment plan with their provider. Functional ability and status Nutritional status Physical activity Advanced directives List of other physicians Hospitalizations, surgeries, and ER visits in previous 12 months Vitals Screenings to include cognitive, depression, and falls Referrals and appointments  In addition, I have reviewed and discussed with patient certain preventive protocols, quality metrics, and best practice recommendations. A written personalized care plan for  preventive services as well as general preventive health recommendations were provided to patient.     Elie Leppo L Rozlyn Yerby, CMA   09/04/2023   After Visit Summary: (MyChart) Due to this being a telephonic visit, the after visit summary with patients personalized plan was offered to patient via MyChart   Notes: Please refer to Routing Comments.

## 2023-09-08 ENCOUNTER — Other Ambulatory Visit (HOSPITAL_COMMUNITY): Payer: Self-pay

## 2023-09-12 DIAGNOSIS — Z79891 Long term (current) use of opiate analgesic: Secondary | ICD-10-CM | POA: Diagnosis not present

## 2023-09-12 DIAGNOSIS — R52 Pain, unspecified: Secondary | ICD-10-CM | POA: Diagnosis not present

## 2023-09-12 DIAGNOSIS — M1990 Unspecified osteoarthritis, unspecified site: Secondary | ICD-10-CM | POA: Diagnosis not present

## 2023-09-12 DIAGNOSIS — M961 Postlaminectomy syndrome, not elsewhere classified: Secondary | ICD-10-CM | POA: Diagnosis not present

## 2023-09-12 DIAGNOSIS — M51362 Other intervertebral disc degeneration, lumbar region with discogenic back pain and lower extremity pain: Secondary | ICD-10-CM | POA: Diagnosis not present

## 2023-09-12 DIAGNOSIS — M461 Sacroiliitis, not elsewhere classified: Secondary | ICD-10-CM | POA: Diagnosis not present

## 2023-09-12 DIAGNOSIS — M6088 Other myositis, other site: Secondary | ICD-10-CM | POA: Diagnosis not present

## 2023-09-12 DIAGNOSIS — M47817 Spondylosis without myelopathy or radiculopathy, lumbosacral region: Secondary | ICD-10-CM | POA: Diagnosis not present

## 2023-09-15 ENCOUNTER — Other Ambulatory Visit: Payer: Self-pay

## 2023-09-15 ENCOUNTER — Encounter: Payer: Self-pay | Admitting: Family Medicine

## 2023-09-15 DIAGNOSIS — M8580 Other specified disorders of bone density and structure, unspecified site: Secondary | ICD-10-CM

## 2023-09-15 MED ORDER — DENOSUMAB 60 MG/ML ~~LOC~~ SOSY: 60.0000 mg | PREFILLED_SYRINGE | Freq: Once | SUBCUTANEOUS | Status: DC

## 2023-09-16 ENCOUNTER — Other Ambulatory Visit: Payer: Self-pay | Admitting: Family Medicine

## 2023-09-16 MED ORDER — OZEMPIC (0.25 OR 0.5 MG/DOSE) 2 MG/3ML ~~LOC~~ SOPN
0.5000 mg | PEN_INJECTOR | SUBCUTANEOUS | 1 refills | Status: DC
  Filled 2023-09-16 – 2023-10-04 (×2): qty 6, 56d supply, fill #0
  Filled 2023-10-27 – 2023-11-15 (×3): qty 6, 56d supply, fill #1

## 2023-09-16 MED ORDER — GABAPENTIN 800 MG PO TABS
800.0000 mg | ORAL_TABLET | Freq: Four times a day (QID) | ORAL | 1 refills | Status: DC
  Filled 2023-09-16: qty 120, 30d supply, fill #0
  Filled 2023-10-04 – 2023-10-30 (×2): qty 120, 30d supply, fill #1

## 2023-09-18 ENCOUNTER — Other Ambulatory Visit (HOSPITAL_COMMUNITY): Payer: Self-pay

## 2023-09-18 ENCOUNTER — Other Ambulatory Visit: Payer: Self-pay

## 2023-09-18 ENCOUNTER — Telehealth: Payer: Self-pay

## 2023-09-18 NOTE — Telephone Encounter (Signed)
 Prolia  VOB initiated via [email or web address]  Next Prolia  inj DUE: 10/30/23   Pharmacy benefit: $12.15

## 2023-09-19 ENCOUNTER — Other Ambulatory Visit: Payer: Self-pay

## 2023-09-19 ENCOUNTER — Other Ambulatory Visit (HOSPITAL_COMMUNITY): Payer: Self-pay

## 2023-09-21 NOTE — Telephone Encounter (Signed)
 Pt ready for scheduling for PROLIA  on or after : 10/30/23  Option# 1: Buy/Bill (Office supplied medication)  Out-of-pocket cost due at time of clinic visit: $332  Number of injection/visits approved: ---  Primary: [employer] Prolia  co-insurance: 20% Admin fee co-insurance: 0%  Secondary: --- Prolia  co-insurance:  Admin fee co-insurance:   Medical Benefit Details: Date Benefits were checked: 09/21/23 Deductible: NO/ Coinsurance: 20%/ Admin Fee: 0%  Prior Auth: N/A PA# Expiration Date:   # of doses approved: ----------------------------------------------------------------------- Option# 2- Med Obtained from pharmacy:  Pharmacy benefit: Copay $12.15 (Paid to pharmacy) Admin Fee: 0% (Pay at clinic)  Prior Auth: N/A PA# Expiration Date:   # of doses approved:   If patient wants fill through the pharmacy benefit please send prescription to: [employer]/RX ADVANCE, and include estimated need by date in rx notes. Pharmacy will ship medication directly to the office.  Patient NOT eligible for Prolia  Copay Card. Copay Card can make patient's cost as little as $25. Link to apply: [email or web address]://[email or web address]  ** This summary of benefits is an estimation of the patient's out-of-pocket cost. Exact cost may very based on individual plan coverage.

## 2023-10-03 DIAGNOSIS — R52 Pain, unspecified: Secondary | ICD-10-CM | POA: Diagnosis not present

## 2023-10-03 DIAGNOSIS — M461 Sacroiliitis, not elsewhere classified: Secondary | ICD-10-CM | POA: Diagnosis not present

## 2023-10-04 ENCOUNTER — Other Ambulatory Visit: Payer: Self-pay

## 2023-10-04 ENCOUNTER — Other Ambulatory Visit (HOSPITAL_COMMUNITY): Payer: Self-pay

## 2023-10-04 ENCOUNTER — Other Ambulatory Visit: Payer: Self-pay | Admitting: Family Medicine

## 2023-10-04 MED ORDER — LEVOTHYROXINE SODIUM 25 MCG PO TABS
25.0000 ug | ORAL_TABLET | Freq: Every day | ORAL | 0 refills | Status: DC
Start: 2023-10-04 — End: 2024-01-10
  Filled 2023-10-04 – 2023-11-28 (×2): qty 90, 90d supply, fill #0

## 2023-10-04 MED ORDER — FLUTICASONE PROPIONATE 50 MCG/ACT NA SUSP
2.0000 | Freq: Two times a day (BID) | NASAL | 1 refills | Status: DC
  Filled 2023-10-04 – 2023-10-17 (×2): qty 16, 30d supply, fill #0
  Filled 2023-10-27 – 2023-11-15 (×3): qty 16, 30d supply, fill #1

## 2023-10-16 ENCOUNTER — Other Ambulatory Visit: Payer: Self-pay

## 2023-10-16 ENCOUNTER — Encounter (HOSPITAL_COMMUNITY): Payer: Self-pay

## 2023-10-16 DIAGNOSIS — M8580 Other specified disorders of bone density and structure, unspecified site: Secondary | ICD-10-CM

## 2023-10-16 MED ORDER — DENOSUMAB 60 MG/ML ~~LOC~~ SOSY
60.0000 mg | PREFILLED_SYRINGE | Freq: Once | SUBCUTANEOUS | 0 refills | Status: DC
  Filled 2023-10-16: qty 1, 1d supply, fill #0
  Filled 2023-10-17: qty 1, 180d supply, fill #0

## 2023-10-16 NOTE — Telephone Encounter (Signed)
 Spoke with patient, due to being unable find prolia  injection after 10/2022. She stated she had one done 04/2023 at [facility]). We do not have documentation of that, I reached out to [facility] they confirmed she had a prolia  in December 2024, and will send us  the documentation. I will go ahead and order patients prolia  injection for 10/2023 as she is due the end of this month. Patient is aware of this update.

## 2023-10-17 ENCOUNTER — Other Ambulatory Visit: Payer: Self-pay

## 2023-10-17 ENCOUNTER — Other Ambulatory Visit (HOSPITAL_COMMUNITY): Payer: Self-pay

## 2023-10-17 NOTE — Progress Notes (Signed)
 Specialty Pharmacy Initial Fill Coordination Note  Kayla Mayo is a 68 y.o. female contacted today regarding initial fill of specialty medication(s) Denosumab  (PROLIA )   Patient requested Courier to Provider Office   Delivery date: 10/19/23   Verified address: [place] [facility]  [address]   Medication will be filled on 6/18.   Patient is aware of $0 copayment.

## 2023-10-17 NOTE — Progress Notes (Signed)
 Pharmacy Patient Advocate Encounter  Insurance verification completed.   The patient is insured through [facility] ADVANTAGE/RX ADVANCE   Ran test claim for Prolia . Co-pay is $0.  This test claim was processed through [facility]- copay amounts may vary at other pharmacies due to pharmacy/plan contracts, or as the patient moves through the different stages of their insurance plan.

## 2023-10-18 DIAGNOSIS — G629 Polyneuropathy, unspecified: Secondary | ICD-10-CM | POA: Diagnosis not present

## 2023-10-18 DIAGNOSIS — I70203 Unspecified atherosclerosis of native arteries of extremities, bilateral legs: Secondary | ICD-10-CM | POA: Diagnosis not present

## 2023-10-18 DIAGNOSIS — Z9181 History of falling: Secondary | ICD-10-CM | POA: Diagnosis not present

## 2023-10-18 DIAGNOSIS — E1142 Type 2 diabetes mellitus with diabetic polyneuropathy: Secondary | ICD-10-CM | POA: Diagnosis not present

## 2023-10-18 DIAGNOSIS — R269 Unspecified abnormalities of gait and mobility: Secondary | ICD-10-CM | POA: Diagnosis not present

## 2023-10-19 ENCOUNTER — Other Ambulatory Visit (HOSPITAL_COMMUNITY): Payer: Self-pay

## 2023-10-19 ENCOUNTER — Other Ambulatory Visit: Payer: Self-pay

## 2023-10-20 ENCOUNTER — Ambulatory Visit

## 2023-10-20 NOTE — Progress Notes (Signed)
 Patient visits today for their prolia  injection. Patient informed of what she had received and tolerated injection well. Patient notified to reach out to office if needed.

## 2023-10-27 ENCOUNTER — Other Ambulatory Visit (HOSPITAL_COMMUNITY): Payer: Self-pay

## 2023-10-30 ENCOUNTER — Other Ambulatory Visit: Payer: Self-pay

## 2023-10-31 ENCOUNTER — Other Ambulatory Visit: Payer: Self-pay

## 2023-10-31 ENCOUNTER — Encounter: Payer: Self-pay | Admitting: Oncology

## 2023-11-02 ENCOUNTER — Other Ambulatory Visit: Payer: Self-pay | Admitting: Family Medicine

## 2023-11-03 MED ORDER — GABAPENTIN 800 MG PO TABS
800.0000 mg | ORAL_TABLET | Freq: Four times a day (QID) | ORAL | 1 refills | Status: DC
  Filled 2023-11-03 – 2023-11-25 (×4): qty 120, 30d supply, fill #0
  Filled 2023-12-25: qty 120, 30d supply, fill #1

## 2023-11-04 ENCOUNTER — Other Ambulatory Visit (HOSPITAL_COMMUNITY): Payer: Self-pay

## 2023-11-06 ENCOUNTER — Other Ambulatory Visit (HOSPITAL_COMMUNITY): Payer: Self-pay

## 2023-11-07 ENCOUNTER — Inpatient Hospital Stay: Payer: PRIVATE HEALTH INSURANCE | Attending: Hematology

## 2023-11-07 DIAGNOSIS — D519 Vitamin B12 deficiency anemia, unspecified: Secondary | ICD-10-CM | POA: Diagnosis not present

## 2023-11-07 DIAGNOSIS — D509 Iron deficiency anemia, unspecified: Secondary | ICD-10-CM

## 2023-11-07 LAB — FERRITIN: Ferritin: 220 ng/mL (ref 11–307)

## 2023-11-07 LAB — CBC WITH DIFFERENTIAL/PLATELET
Abs Immature Granulocytes: 0.01 K/uL (ref 0.00–0.07)
Basophils Absolute: 0 K/uL (ref 0.0–0.1)
Basophils Relative: 1 %
Eosinophils Absolute: 0.1 K/uL (ref 0.0–0.5)
Eosinophils Relative: 2 %
HCT: 38.5 % (ref 36.0–46.0)
Hemoglobin: 12.6 g/dL (ref 12.0–15.0)
Immature Granulocytes: 0 %
Lymphocytes Relative: 34 %
Lymphs Abs: 1.3 K/uL (ref 0.7–4.0)
MCH: 31.4 pg (ref 26.0–34.0)
MCHC: 32.7 g/dL (ref 30.0–36.0)
MCV: 96 fL (ref 80.0–100.0)
Monocytes Absolute: 0.2 K/uL (ref 0.1–1.0)
Monocytes Relative: 6 %
Neutro Abs: 2.2 K/uL (ref 1.7–7.7)
Neutrophils Relative %: 57 %
Platelets: 233 K/uL (ref 150–400)
RBC: 4.01 MIL/uL (ref 3.87–5.11)
RDW: 13.3 % (ref 11.5–15.5)
WBC: 3.9 K/uL — ABNORMAL LOW (ref 4.0–10.5)
nRBC: 0 % (ref 0.0–0.2)

## 2023-11-15 ENCOUNTER — Other Ambulatory Visit (HOSPITAL_COMMUNITY): Payer: Self-pay

## 2023-11-15 ENCOUNTER — Other Ambulatory Visit: Payer: Self-pay

## 2023-11-16 ENCOUNTER — Other Ambulatory Visit: Payer: Self-pay

## 2023-11-16 ENCOUNTER — Encounter: Payer: Self-pay | Admitting: Oncology

## 2023-11-16 ENCOUNTER — Other Ambulatory Visit (HOSPITAL_COMMUNITY): Payer: Self-pay

## 2023-11-16 MED ORDER — LINZESS 290 MCG PO CAPS
290.0000 ug | ORAL_CAPSULE | Freq: Every day | ORAL | 3 refills | Status: DC
  Filled 2023-11-16: qty 90, 90d supply, fill #0

## 2023-11-16 MED ORDER — ESOMEPRAZOLE MAGNESIUM 40 MG PO CPDR
40.0000 mg | DELAYED_RELEASE_CAPSULE | Freq: Every day | ORAL | 3 refills | Status: DC
  Filled 2023-11-16: qty 90, 90d supply, fill #0
  Filled 2024-01-10 – 2024-01-27 (×3): qty 90, 90d supply, fill #1
  Filled 2024-01-29 – 2024-03-17 (×2): qty 90, 90d supply, fill #2

## 2023-11-17 DIAGNOSIS — M48061 Spinal stenosis, lumbar region without neurogenic claudication: Secondary | ICD-10-CM | POA: Insufficient documentation

## 2023-11-21 ENCOUNTER — Other Ambulatory Visit: Payer: Self-pay

## 2023-11-21 ENCOUNTER — Encounter: Payer: Self-pay | Admitting: Family Medicine

## 2023-11-21 ENCOUNTER — Ambulatory Visit (INDEPENDENT_AMBULATORY_CARE_PROVIDER_SITE_OTHER): Payer: PRIVATE HEALTH INSURANCE | Admitting: Family Medicine

## 2023-11-21 ENCOUNTER — Other Ambulatory Visit (HOSPITAL_COMMUNITY): Payer: Self-pay

## 2023-11-21 VITALS — BP 130/78 | HR 81 | Temp 97.8°F | Ht 65.0 in | Wt 163.2 lb

## 2023-11-21 DIAGNOSIS — D519 Vitamin B12 deficiency anemia, unspecified: Secondary | ICD-10-CM | POA: Diagnosis not present

## 2023-11-21 DIAGNOSIS — Z7985 Long-term (current) use of injectable non-insulin antidiabetic drugs: Secondary | ICD-10-CM

## 2023-11-21 DIAGNOSIS — G720 Drug-induced myopathy: Secondary | ICD-10-CM

## 2023-11-21 DIAGNOSIS — E782 Mixed hyperlipidemia: Secondary | ICD-10-CM

## 2023-11-21 DIAGNOSIS — Z79899 Other long term (current) drug therapy: Secondary | ICD-10-CM | POA: Diagnosis not present

## 2023-11-21 DIAGNOSIS — E1143 Type 2 diabetes mellitus with diabetic autonomic (poly)neuropathy: Secondary | ICD-10-CM | POA: Diagnosis not present

## 2023-11-21 DIAGNOSIS — F339 Major depressive disorder, recurrent, unspecified: Secondary | ICD-10-CM

## 2023-11-21 DIAGNOSIS — M8580 Other specified disorders of bone density and structure, unspecified site: Secondary | ICD-10-CM | POA: Diagnosis not present

## 2023-11-21 DIAGNOSIS — G894 Chronic pain syndrome: Secondary | ICD-10-CM

## 2023-11-21 DIAGNOSIS — T466X5A Adverse effect of antihyperlipidemic and antiarteriosclerotic drugs, initial encounter: Secondary | ICD-10-CM

## 2023-11-21 DIAGNOSIS — Z1231 Encounter for screening mammogram for malignant neoplasm of breast: Secondary | ICD-10-CM

## 2023-11-21 DIAGNOSIS — Z1211 Encounter for screening for malignant neoplasm of colon: Secondary | ICD-10-CM | POA: Insufficient documentation

## 2023-11-21 DIAGNOSIS — E039 Hypothyroidism, unspecified: Secondary | ICD-10-CM

## 2023-11-21 LAB — COMPREHENSIVE METABOLIC PANEL WITH GFR
ALT: 20 U/L (ref 0–35)
AST: 26 U/L (ref 0–37)
Albumin: 4.5 g/dL (ref 3.5–5.2)
Alkaline Phosphatase: 49 U/L (ref 39–117)
BUN: 16 mg/dL (ref 6–23)
CO2: 31 meq/L (ref 19–32)
Calcium: 9.6 mg/dL (ref 8.4–10.5)
Chloride: 103 meq/L (ref 96–112)
Creatinine, Ser: 0.99 mg/dL (ref 0.40–1.20)
GFR: 58.87 mL/min — ABNORMAL LOW (ref >60.00)
Glucose, Bld: 141 mg/dL — ABNORMAL HIGH (ref 70–99)
Potassium: 4.5 meq/L (ref 3.5–5.1)
Sodium: 141 meq/L (ref 135–145)
Total Bilirubin: 0.8 mg/dL (ref 0.2–1.2)
Total Protein: 7.6 g/dL (ref 6.0–8.3)

## 2023-11-21 LAB — CBC WITH DIFFERENTIAL/PLATELET
Basophils Absolute: 0 K/uL (ref 0.0–0.1)
Basophils Relative: 0.8 % (ref 0.0–3.0)
Eosinophils Absolute: 0.1 K/uL (ref 0.0–0.7)
Eosinophils Relative: 1.1 % (ref 0.0–5.0)
HCT: 40.7 % (ref 36.0–46.0)
Hemoglobin: 13.5 g/dL (ref 12.0–15.0)
Lymphocytes Relative: 25.6 % (ref 12.0–46.0)
Lymphs Abs: 1.2 K/uL (ref 0.7–4.0)
MCHC: 33.1 g/dL (ref 30.0–36.0)
MCV: 96 fl (ref 78.0–100.0)
Monocytes Absolute: 0.3 K/uL (ref 0.1–1.0)
Monocytes Relative: 6.4 % (ref 3.0–12.0)
Neutro Abs: 3.2 K/uL (ref 1.4–7.7)
Neutrophils Relative %: 66.1 % (ref 43.0–77.0)
Platelets: 278 K/uL (ref 150.0–400.0)
RBC: 4.24 Mil/uL (ref 3.87–5.11)
RDW: 14.1 % (ref 11.5–15.5)
WBC: 4.8 K/uL (ref 4.0–10.5)

## 2023-11-21 LAB — VITAMIN D 25 HYDROXY (VIT D DEFICIENCY, FRACTURES): VITD: 43.23 ng/mL (ref 30.00–100.00)

## 2023-11-21 LAB — LIPID PANEL
Cholesterol: 283 mg/dL — ABNORMAL HIGH (ref 0–200)
HDL: 87.8 mg/dL (ref >39.00)
LDL Cholesterol: 177 mg/dL — ABNORMAL HIGH (ref 0–99)
NonHDL: 194.74
Total CHOL/HDL Ratio: 3
Triglycerides: 87 mg/dL (ref 0.0–149.0)
VLDL: 17.4 mg/dL (ref 0.0–40.0)

## 2023-11-21 LAB — TSH: TSH: 1.84 u[IU]/mL (ref 0.35–5.50)

## 2023-11-21 LAB — HEMOGLOBIN A1C: Hgb A1c MFr Bld: 5.6 % (ref 4.6–6.5)

## 2023-11-21 LAB — VITAMIN B12: Vitamin B-12: >1500 pg/mL — ABNORMAL HIGH (ref 211–911)

## 2023-11-21 MED ORDER — EZETIMIBE 10 MG PO TABS
10.0000 mg | ORAL_TABLET | Freq: Every day | ORAL | 0 refills | Status: DC
  Filled 2023-11-21: qty 30, 30d supply, fill #0

## 2023-11-21 NOTE — Progress Notes (Addendum)
 Established Patient Office Visit  Subjective:     Patient ID: Kayla Mayo, female    DOB: 01-20-1956, 68 y.o.   MRN: [id number]  No chief complaint on file.   HPI  Discussed the use of AI scribe software for clinical note transcription with the patient, who gave verbal consent to proceed.  History of Present Illness Kayla Mayo is a 68 year old female with statin intolerance who presents with joint pain and a request for diabetic shoes.  Polyarthralgia - Significant joint pain in hands and knees - Symptoms associated with statin use, including Lipitor, simvastatin , and rosuvastatin  - Joint pain improved after discontinuing statins for three weeks - Pain recurred upon resuming statin therapy - Family history of similar symptoms in her son with cholesterol medication  Footwear needs in diabetes mellitus - Seeking diabetic shoes as recommended by podiatrist - Previously obtained shoes from [employer] - Exploring other options due to insurance changes - Has not yet visited [facility]  Cardiometabolic medication use - Currently taking Ozempic  for heart health   ROS Per HPI      Objective:    BP 130/78 (BP Location: Right Arm, Patient Position: Sitting, Cuff Size: Large)   Pulse 81   Temp 97.8 F (36.6 C)   Ht 5' 5 (1.651 m)   Wt 163 lb 3.2 oz (74 kg)   LMP 05/02/2006   SpO2 98%   BMI 27.16 kg/m    Physical Exam Vitals and nursing note reviewed.  Constitutional:      General: She is not in acute distress.    Appearance: Normal appearance.  HENT:     Head: Normocephalic and atraumatic.     Right Ear: External ear normal.     Left Ear: External ear normal.     Nose: Nose normal.     Mouth/Throat:     Mouth: Mucous membranes are moist.     Pharynx: Oropharynx is clear.  Eyes:     Extraocular Movements: Extraocular movements intact.     Pupils: Pupils are equal, round, and reactive to light.  Cardiovascular:     Rate and Rhythm:  Normal rate and regular rhythm.     Pulses: Normal pulses.     Heart sounds: Normal heart sounds.  Pulmonary:     Effort: Pulmonary effort is normal. No respiratory distress.     Breath sounds: Normal breath sounds. No wheezing, rhonchi or rales.  Musculoskeletal:        General: Normal range of motion.     Cervical back: Normal range of motion.     Right lower leg: No edema.     Left lower leg: No edema.  Lymphadenopathy:     Cervical: No cervical adenopathy.  Neurological:     General: No focal deficit present.     Mental Status: She is alert and oriented to person, place, and time.  Psychiatric:        Mood and Affect: Mood normal.        Thought Content: Thought content normal.    No results found for any visits on 11/21/23.  The 10-year ASCVD risk score (Arnett DK, et al., 2019) is: 20.9%  BP Readings from Last 3 Encounters:  11/21/23 130/78  08/04/23 122/80  06/28/23 102/65   Wt Readings from Last 3 Encounters:  11/21/23 163 lb 3.2 oz (74 kg)  09/04/23 162 lb (73.5 kg)  08/04/23 162 lb 6.4 oz (73.7 kg)  Last CBC Lab Results  Component Value Date   WBC 3.9 (L) 11/07/2023   HGB 12.6 11/07/2023   HCT 38.5 11/07/2023   MCV 96.0 11/07/2023   MCH 31.4 11/07/2023   RDW 13.3 11/07/2023   PLT 233 11/07/2023   Last metabolic panel Lab Results  Component Value Date   GLUCOSE 112 (H) 08/04/2023   NA 139 08/04/2023   K 3.9 08/04/2023   CL 103 08/04/2023   CO2 28 08/04/2023   BUN 14 08/04/2023   CREATININE 1.01 08/04/2023   GFR 57.60 (L) 08/04/2023   CALCIUM  8.9 08/04/2023   PHOS 3.9 11/27/2008   PROT 7.1 08/04/2023   ALBUMIN 4.4 08/04/2023   BILITOT 0.7 08/04/2023   ALKPHOS 71 08/04/2023   AST 23 08/04/2023   ALT 22 08/04/2023   ANIONGAP 8 06/28/2023   Last lipids Lab Results  Component Value Date   CHOL 183 03/23/2011   HDL 69.60 03/23/2011   LDLCALC 100 (H) 03/23/2011   TRIG 69.0 03/23/2011   CHOLHDL 3 03/23/2011   Last hemoglobin A1c Lab  Results  Component Value Date   HGBA1C 5.7 (H) 06/14/2023   Last thyroid  functions  Last vitamin D  Lab Results  Component Value Date   VD25OH 35.34 09/25/2020   Last vitamin B12 and Folate Lab Results  Component Value Date   VITAMINB12 445 08/08/2023   FOLATE >20.0 11/25/2008         Assessment & Plan:   Assessment and Plan Assessment & Plan Hyperlipidemia/Statin Myopathy She experienced joint pain with statins, improved after discontinuation. Discussed Zetia  and Repatha as alternatives. - Prescribe Zetia  for 30 days to monitor for joint pain. - Provide information on Zetia  and Repatha. - Consider Repatha if Zetia  is not tolerated.  Diabetic foot care/Type 2 Diabetes Requires diabetic shoes as per podiatrist recommendation. Clarified referral process and discussed alternative locations. - Provide referral to [facility] for diabetic shoes. - Print order for diabetic shoes. - Discuss alternative locations for obtaining diabetic shoes, such as the [employer] account executive. - A1c has been stable, checking A1c today - Continue to check blood sugars x 4 daily - Continue semaglutide  0.5mg  once weekly  MDD - Stable  Osteopenia - Stable, UTD DEXA  Hypothyroidism - TSH  Colon Cancer Screening - Cologuard ordered  Breast Cancer Screening - Mammogram ordered  Chronic Pain Syndrome - Stable  General Health Maintenance - Ensure Cologuard test is in the system.     Orders Placed This Encounter  Procedures   For Home Use Only DME Diabetic Shoe   MM 3D SCREENING MAMMOGRAM BILATERAL BREAST    Standing Status:   Future    Expiration Date:   11/20/2024    Reason for Exam (SYMPTOM  OR DIAGNOSIS REQUIRED):   screening for breast cancer    Preferred imaging location?:   [facility]   CBC with Differential/Platelet    Release to patient:   Immediate [1]   Comprehensive metabolic panel with GFR    Release to patient:   Immediate [1]   Hemoglobin  A1c   Lipid panel   Microalbumin / creatinine urine ratio    Release to patient:   Immediate   VITAMIN D  25 Hydroxy (Vit-D Deficiency, Fractures)   Cologuard   Vitamin B12   TSH   Ambulatory referral to Podiatry    Referral Priority:   Routine    Referral Type:   Consultation    Referral Reason:   Specialty  Services Required    Requested Specialty:   Podiatry    Number of Visits Requested:   1     Meds ordered this encounter  Medications   DISCONTD: ezetimibe  (ZETIA ) 10 MG tablet    Sig: Take 1 tablet (10 mg total) by mouth daily.    Dispense:  30 tablet    Refill:  0   ezetimibe  (ZETIA ) 10 MG tablet    Sig: Take 1 tablet (10 mg total) by mouth daily.    Dispense:  30 tablet    Refill:  0    Patient prefers mail delivery    Return in about 3 months (around 02/21/2024) for meds/labs.  Corean LITTIE Ku, FNP

## 2023-11-21 NOTE — Patient Instructions (Signed)
 Scientist, product/process development on [address]  [facility] [address] zetia  once daily. STOP this medication if you are having joint pain and let me know.  STOP rosuvastatin .   Call to schedule mammogram.   Continue other current medications.  We are checking labs today, will be in contact with any results that require further attention

## 2023-11-22 ENCOUNTER — Other Ambulatory Visit (HOSPITAL_COMMUNITY): Payer: Self-pay

## 2023-11-24 ENCOUNTER — Ambulatory Visit: Payer: Self-pay | Admitting: Family Medicine

## 2023-11-27 ENCOUNTER — Other Ambulatory Visit: Payer: Self-pay

## 2023-11-28 ENCOUNTER — Other Ambulatory Visit (HOSPITAL_COMMUNITY): Payer: Self-pay

## 2023-11-28 ENCOUNTER — Encounter: Payer: Self-pay | Admitting: Family Medicine

## 2023-11-28 ENCOUNTER — Other Ambulatory Visit: Payer: Self-pay

## 2023-11-29 ENCOUNTER — Encounter: Payer: Self-pay | Admitting: Family Medicine

## 2023-11-29 ENCOUNTER — Other Ambulatory Visit: Payer: Self-pay

## 2023-11-29 LAB — HM DIABETES EYE EXAM

## 2023-11-30 ENCOUNTER — Other Ambulatory Visit: Payer: Self-pay

## 2023-11-30 ENCOUNTER — Other Ambulatory Visit (HOSPITAL_BASED_OUTPATIENT_CLINIC_OR_DEPARTMENT_OTHER): Payer: Self-pay

## 2023-11-30 MED ORDER — HYDROXYZINE HCL 25 MG PO TABS
25.0000 mg | ORAL_TABLET | Freq: Three times a day (TID) | ORAL | 1 refills | Status: DC
  Filled 2023-11-30: qty 270, 90d supply, fill #0
  Filled 2024-02-24: qty 270, 90d supply, fill #1

## 2023-12-06 ENCOUNTER — Ambulatory Visit
Admission: RE | Admit: 2023-12-06 | Discharge: 2023-12-06 | Disposition: A | Discharge status: Home / Self Care | Source: Ambulatory Visit | Attending: Family Medicine

## 2023-12-06 DIAGNOSIS — Z1231 Encounter for screening mammogram for malignant neoplasm of breast: Secondary | ICD-10-CM

## 2023-12-11 ENCOUNTER — Other Ambulatory Visit (HOSPITAL_COMMUNITY): Payer: Self-pay

## 2023-12-11 ENCOUNTER — Other Ambulatory Visit: Payer: Self-pay | Admitting: Family Medicine

## 2023-12-11 ENCOUNTER — Other Ambulatory Visit: Payer: Self-pay

## 2023-12-11 ENCOUNTER — Other Ambulatory Visit (HOSPITAL_BASED_OUTPATIENT_CLINIC_OR_DEPARTMENT_OTHER): Payer: Self-pay

## 2023-12-11 DIAGNOSIS — Z79899 Other long term (current) drug therapy: Secondary | ICD-10-CM

## 2023-12-11 DIAGNOSIS — E782 Mixed hyperlipidemia: Secondary | ICD-10-CM

## 2023-12-11 MED ORDER — FLUTICASONE PROPIONATE 50 MCG/ACT NA SUSP
2.0000 | Freq: Two times a day (BID) | NASAL | 1 refills | Status: DC
  Filled 2023-12-11: qty 16, 30d supply, fill #0
  Filled 2023-12-25 – 2024-01-10 (×2): qty 16, 30d supply, fill #1

## 2023-12-11 MED ORDER — EZETIMIBE 10 MG PO TABS
10.0000 mg | ORAL_TABLET | Freq: Every day | ORAL | 0 refills | Status: DC
  Filled 2023-12-11 – 2023-12-14 (×2): qty 30, 30d supply, fill #0
  Filled ????-??-??: fill #0

## 2023-12-12 ENCOUNTER — Other Ambulatory Visit (HOSPITAL_COMMUNITY): Payer: Self-pay

## 2023-12-12 DIAGNOSIS — T402X5A Adverse effect of other opioids, initial encounter: Secondary | ICD-10-CM | POA: Diagnosis not present

## 2023-12-12 DIAGNOSIS — Z79899 Other long term (current) drug therapy: Secondary | ICD-10-CM | POA: Diagnosis not present

## 2023-12-12 DIAGNOSIS — K219 Gastro-esophageal reflux disease without esophagitis: Secondary | ICD-10-CM | POA: Diagnosis not present

## 2023-12-12 DIAGNOSIS — K5903 Drug induced constipation: Secondary | ICD-10-CM | POA: Diagnosis not present

## 2023-12-12 MED ORDER — LINZESS 290 MCG PO CAPS
290.0000 ug | ORAL_CAPSULE | Freq: Every day | ORAL | 3 refills | Status: DC
  Filled 2024-01-10 – 2024-01-23 (×3): qty 90, 90d supply, fill #0

## 2023-12-14 ENCOUNTER — Other Ambulatory Visit (HOSPITAL_COMMUNITY): Payer: Self-pay

## 2023-12-14 ENCOUNTER — Other Ambulatory Visit: Payer: Self-pay

## 2023-12-19 DIAGNOSIS — M47896 Other spondylosis, lumbar region: Secondary | ICD-10-CM | POA: Diagnosis not present

## 2023-12-19 DIAGNOSIS — M791 Myalgia, unspecified site: Secondary | ICD-10-CM | POA: Diagnosis not present

## 2023-12-19 DIAGNOSIS — M51369 Other intervertebral disc degeneration, lumbar region without mention of lumbar back pain or lower extremity pain: Secondary | ICD-10-CM | POA: Diagnosis not present

## 2023-12-19 DIAGNOSIS — M5451 Vertebrogenic low back pain: Secondary | ICD-10-CM | POA: Diagnosis not present

## 2023-12-19 DIAGNOSIS — M47816 Spondylosis without myelopathy or radiculopathy, lumbar region: Secondary | ICD-10-CM | POA: Diagnosis not present

## 2023-12-21 DIAGNOSIS — Z1211 Encounter for screening for malignant neoplasm of colon: Secondary | ICD-10-CM | POA: Diagnosis not present

## 2023-12-22 ENCOUNTER — Other Ambulatory Visit: Payer: Self-pay | Admitting: Family Medicine

## 2023-12-25 ENCOUNTER — Other Ambulatory Visit (HOSPITAL_COMMUNITY): Payer: Self-pay

## 2023-12-25 ENCOUNTER — Other Ambulatory Visit: Payer: Self-pay

## 2023-12-25 ENCOUNTER — Other Ambulatory Visit: Payer: Self-pay | Admitting: Family Medicine

## 2023-12-25 MED ORDER — OZEMPIC (0.25 OR 0.5 MG/DOSE) 2 MG/3ML ~~LOC~~ SOPN
0.5000 mg | PEN_INJECTOR | SUBCUTANEOUS | 1 refills | Status: DC
  Filled 2023-12-25 – 2023-12-27 (×2): qty 6, 56d supply, fill #0
  Filled 2024-01-10: qty 6, 56d supply, fill #1
  Filled 2024-01-10: qty 6, 56d supply, fill #0
  Filled 2024-01-29 – 2024-02-22 (×2): qty 6, 56d supply, fill #1

## 2023-12-27 ENCOUNTER — Other Ambulatory Visit (HOSPITAL_COMMUNITY): Payer: Self-pay

## 2023-12-27 DIAGNOSIS — M47817 Spondylosis without myelopathy or radiculopathy, lumbosacral region: Secondary | ICD-10-CM | POA: Diagnosis not present

## 2023-12-27 DIAGNOSIS — M1990 Unspecified osteoarthritis, unspecified site: Secondary | ICD-10-CM | POA: Diagnosis not present

## 2023-12-27 DIAGNOSIS — M6088 Other myositis, other site: Secondary | ICD-10-CM | POA: Diagnosis not present

## 2023-12-27 DIAGNOSIS — M51362 Other intervertebral disc degeneration, lumbar region with discogenic back pain and lower extremity pain: Secondary | ICD-10-CM | POA: Diagnosis not present

## 2023-12-27 DIAGNOSIS — Z79891 Long term (current) use of opiate analgesic: Secondary | ICD-10-CM | POA: Diagnosis not present

## 2023-12-27 DIAGNOSIS — M961 Postlaminectomy syndrome, not elsewhere classified: Secondary | ICD-10-CM | POA: Diagnosis not present

## 2023-12-27 DIAGNOSIS — M461 Sacroiliitis, not elsewhere classified: Secondary | ICD-10-CM | POA: Diagnosis not present

## 2023-12-27 LAB — COLOGUARD: COLOGUARD: NEGATIVE

## 2024-01-09 ENCOUNTER — Other Ambulatory Visit: Payer: Self-pay

## 2024-01-09 DIAGNOSIS — M5416 Radiculopathy, lumbar region: Secondary | ICD-10-CM | POA: Diagnosis not present

## 2024-01-09 DIAGNOSIS — M8580 Other specified disorders of bone density and structure, unspecified site: Secondary | ICD-10-CM

## 2024-01-09 MED ORDER — DENOSUMAB 60 MG/ML ~~LOC~~ SOSY
60.0000 mg | PREFILLED_SYRINGE | Freq: Once | SUBCUTANEOUS | 0 refills | Status: DC
  Filled 2024-01-09: qty 1, 1d supply, fill #0

## 2024-01-10 ENCOUNTER — Other Ambulatory Visit: Payer: Self-pay

## 2024-01-10 ENCOUNTER — Other Ambulatory Visit: Payer: Self-pay | Admitting: Family Medicine

## 2024-01-10 ENCOUNTER — Other Ambulatory Visit (HOSPITAL_COMMUNITY): Payer: Self-pay

## 2024-01-10 ENCOUNTER — Telehealth: Payer: Self-pay | Admitting: Hematology

## 2024-01-10 ENCOUNTER — Other Ambulatory Visit (HOSPITAL_BASED_OUTPATIENT_CLINIC_OR_DEPARTMENT_OTHER): Payer: Self-pay

## 2024-01-10 DIAGNOSIS — Z79899 Other long term (current) drug therapy: Secondary | ICD-10-CM

## 2024-01-10 DIAGNOSIS — E782 Mixed hyperlipidemia: Secondary | ICD-10-CM

## 2024-01-10 MED ORDER — EZETIMIBE 10 MG PO TABS
10.0000 mg | ORAL_TABLET | Freq: Every day | ORAL | 0 refills | Status: DC
Start: 2024-01-10 — End: 2024-02-06
  Filled 2024-01-10: qty 30, 30d supply, fill #0

## 2024-01-10 MED ORDER — LEVOTHYROXINE SODIUM 25 MCG PO TABS
25.0000 ug | ORAL_TABLET | Freq: Every day | ORAL | 0 refills | Status: DC
  Filled 2024-01-10 – 2024-02-06 (×5): qty 90, 90d supply, fill #0

## 2024-01-10 MED ORDER — CYANOCOBALAMIN 1000 MCG/ML IJ SOLN
1000.0000 ug | INTRAMUSCULAR | 1 refills | Status: DC
  Filled 2024-01-10 (×2): qty 3, 180d supply, fill #0
  Filled 2024-01-22: qty 1, 60d supply, fill #0
  Filled 2024-01-23: qty 3, 180d supply, fill #0

## 2024-01-10 NOTE — Progress Notes (Signed)
 Verbal order w/readback from Dr. Lanny stating to change frequency to Q84months.  Order placed and sent to [facility].  Dr. Demetra Secure Chat response - please refill her B12 inejctions and change to every 2 months. Powell, you will see her on 10/14, thx

## 2024-01-22 ENCOUNTER — Other Ambulatory Visit: Payer: Self-pay | Admitting: Family Medicine

## 2024-01-22 ENCOUNTER — Other Ambulatory Visit: Payer: Self-pay

## 2024-01-22 ENCOUNTER — Other Ambulatory Visit (HOSPITAL_COMMUNITY): Payer: Self-pay

## 2024-01-22 MED ORDER — GABAPENTIN 800 MG PO TABS
800.0000 mg | ORAL_TABLET | Freq: Four times a day (QID) | ORAL | 1 refills | Status: DC
  Filled 2024-01-22: qty 120, 30d supply, fill #0
  Filled 2024-02-06 – 2024-02-20 (×2): qty 120, 30d supply, fill #1

## 2024-01-23 ENCOUNTER — Other Ambulatory Visit: Payer: Self-pay | Admitting: Family Medicine

## 2024-01-23 ENCOUNTER — Encounter: Payer: Self-pay | Admitting: Oncology

## 2024-01-23 ENCOUNTER — Other Ambulatory Visit (HOSPITAL_COMMUNITY): Payer: Self-pay

## 2024-01-23 DIAGNOSIS — Z79899 Other long term (current) drug therapy: Secondary | ICD-10-CM

## 2024-01-23 DIAGNOSIS — E782 Mixed hyperlipidemia: Secondary | ICD-10-CM

## 2024-01-24 ENCOUNTER — Other Ambulatory Visit: Payer: Self-pay

## 2024-01-26 ENCOUNTER — Telehealth: Payer: Self-pay

## 2024-01-26 NOTE — Telephone Encounter (Signed)
 Forms have ben faxed back successfully for [employer] [facility] [employer]

## 2024-01-28 ENCOUNTER — Other Ambulatory Visit (HOSPITAL_COMMUNITY): Payer: Self-pay

## 2024-01-29 ENCOUNTER — Other Ambulatory Visit (HOSPITAL_COMMUNITY): Payer: Self-pay

## 2024-01-29 ENCOUNTER — Other Ambulatory Visit: Payer: Self-pay | Admitting: Family Medicine

## 2024-01-29 DIAGNOSIS — E782 Mixed hyperlipidemia: Secondary | ICD-10-CM

## 2024-01-29 DIAGNOSIS — Z79899 Other long term (current) drug therapy: Secondary | ICD-10-CM

## 2024-02-06 ENCOUNTER — Other Ambulatory Visit: Payer: Self-pay | Admitting: Family Medicine

## 2024-02-06 ENCOUNTER — Other Ambulatory Visit (HOSPITAL_COMMUNITY): Payer: Self-pay

## 2024-02-06 ENCOUNTER — Other Ambulatory Visit: Payer: Self-pay

## 2024-02-06 DIAGNOSIS — E782 Mixed hyperlipidemia: Secondary | ICD-10-CM

## 2024-02-06 DIAGNOSIS — Z79899 Other long term (current) drug therapy: Secondary | ICD-10-CM

## 2024-02-06 MED ORDER — EZETIMIBE 10 MG PO TABS
10.0000 mg | ORAL_TABLET | Freq: Every day | ORAL | 0 refills | Status: DC
  Filled 2024-02-06: qty 30, 30d supply, fill #0

## 2024-02-06 MED ORDER — FLUTICASONE PROPIONATE 50 MCG/ACT NA SUSP
2.0000 | Freq: Two times a day (BID) | NASAL | 1 refills | Status: DC
  Filled 2024-02-06: qty 16, 30d supply, fill #0
  Filled 2024-02-20 – 2024-03-06 (×4): qty 16, 30d supply, fill #1

## 2024-02-09 DIAGNOSIS — M47896 Other spondylosis, lumbar region: Secondary | ICD-10-CM | POA: Diagnosis not present

## 2024-02-09 DIAGNOSIS — M51369 Other intervertebral disc degeneration, lumbar region without mention of lumbar back pain or lower extremity pain: Secondary | ICD-10-CM | POA: Diagnosis not present

## 2024-02-09 DIAGNOSIS — M791 Myalgia, unspecified site: Secondary | ICD-10-CM | POA: Diagnosis not present

## 2024-02-12 ENCOUNTER — Other Ambulatory Visit: Payer: Self-pay | Admitting: Nurse Practitioner

## 2024-02-12 DIAGNOSIS — M6088 Other myositis, other site: Secondary | ICD-10-CM | POA: Diagnosis not present

## 2024-02-12 DIAGNOSIS — D500 Iron deficiency anemia secondary to blood loss (chronic): Secondary | ICD-10-CM

## 2024-02-12 DIAGNOSIS — M1990 Unspecified osteoarthritis, unspecified site: Secondary | ICD-10-CM | POA: Diagnosis not present

## 2024-02-12 DIAGNOSIS — D510 Vitamin B12 deficiency anemia due to intrinsic factor deficiency: Secondary | ICD-10-CM

## 2024-02-12 DIAGNOSIS — M961 Postlaminectomy syndrome, not elsewhere classified: Secondary | ICD-10-CM | POA: Diagnosis not present

## 2024-02-12 DIAGNOSIS — Z79891 Long term (current) use of opiate analgesic: Secondary | ICD-10-CM | POA: Diagnosis not present

## 2024-02-12 DIAGNOSIS — M51362 Other intervertebral disc degeneration, lumbar region with discogenic back pain and lower extremity pain: Secondary | ICD-10-CM | POA: Diagnosis not present

## 2024-02-12 DIAGNOSIS — M47817 Spondylosis without myelopathy or radiculopathy, lumbosacral region: Secondary | ICD-10-CM | POA: Diagnosis not present

## 2024-02-12 DIAGNOSIS — M461 Sacroiliitis, not elsewhere classified: Secondary | ICD-10-CM | POA: Diagnosis not present

## 2024-02-12 NOTE — Progress Notes (Deleted)
 Patient Care Team: Alvia Corean CROME, FNP as PCP - General (Family Medicine) [place], Lavoy Bernards, [place] (Optometry)  Clinic Day:  02/12/2024  Referring physician: Alvia Corean CROME, *  ASSESSMENT & PLAN:   Assessment & Plan: Anemia of decreased vitamin B12 absorption -She received monthly vitamin B 12 injections, now at home since 04/2018. Last IV iron infusion was in 10/2018. She is not on iron pills now due to low absorption.      The patient understands the plans discussed today and is in agreement with them.  She knows to contact our office if she develops concerns prior to her next appointment.  I provided *** minutes of face-to-face time during this encounter and > 50% was spent counseling as documented under my assessment and plan.    Powell FORBES Lessen, NP  [facility] - A DEPT OF [facility] [address] [place] [address] Dept: [phone] Dept Fax: [phone]   No orders of the defined types were placed in this encounter.     CHIEF COMPLAINT:  CC: Anemia due to vitamin B12 deficiency  Current Treatment: Oral iron; B12 injection monthly (given at home); IV iron as needed for ferritin <20  INTERVAL HISTORY:  Louann is here today for repeat clinical assessment.  She was last seen by Dr. Lanny on 05/24/2023.  She denies fevers or chills. She denies pain. Her appetite is good. Her weight {Weight change:10426}.  I have reviewed the past medical history, past surgical history, social history and family history with the patient and they are unchanged from previous note.  ALLERGIES:  is allergic to morphine  and codeine, blood-group specific substance, codeine, enalapril , lactose intolerance (gi), oxycodone , peanut-containing drug products, and simvastatin .  MEDICATIONS:  Current Outpatient Medications  Medication Sig Dispense Refill   Continuous Glucose Sensor (FREESTYLE LIBRE 3 PLUS SENSOR) MISC  Change sensor every 15 days. 6 each 3   cyanocobalamin  (VITAMIN B12) 1000 MCG/ML injection Inject 1 mL (1,000 mcg total) into the muscle every 2 (two) months. Administration frequency changed to every 2 months. 3 mL 1   denosumab  (PROLIA ) 60 MG/ML SOSY injection Inject 60 mg into the skin every 6 (six) months.     [START ON 04/19/2024] denosumab  (PROLIA ) 60 MG/ML SOSY injection Inject 60 mg into the skin once for 1 dose. 1 mL 0   dicyclomine  (BENTYL ) 20 MG tablet Take 1 tablet (20 mg total) by mouth 3 (three) times daily as needed for spasms (abdominal pain). 90 tablet 3   esomeprazole  (NEXIUM ) 40 MG capsule Take 40 mg by mouth daily at 12 noon.     esomeprazole  (NEXIUM ) 40 MG capsule Take 1 capsule (40 mg total) by mouth daily. (Patient not taking: Reported on 11/21/2023) 90 capsule 3   ezetimibe  (ZETIA ) 10 MG tablet Take 1 tablet (10 mg total) by mouth daily. 30 tablet 0   fluticasone  (FLONASE ) 50 MCG/ACT nasal spray Place 2 sprays into both nostrils 2 (two) times daily. 16 g 1   gabapentin  (NEURONTIN ) 800 MG tablet Take 1 tablet (800 mg total) by mouth 4 (four) times daily. 120 tablet 1   HYDROcodone -acetaminophen  (NORCO) 10-325 MG tablet Take 1 tablet by mouth.     hydrOXYzine  (ATARAX ) 25 MG tablet Take 1 tablet (25 mg total) by mouth 3 (three) times daily. 270 tablet 1   levothyroxine  (SYNTHROID ) 25 MCG tablet Take 1 tablet (25 mcg total) by mouth at bedtime. 90 tablet 0  Linaclotide  (LINZESS ) 290 MCG CAPS capsule Take 290 mcg by mouth daily.      linaclotide  (LINZESS ) 290 MCG CAPS capsule Take 1 capsule (290 mcg total) by mouth daily. 90 capsule 3   linaclotide  (LINZESS ) 290 MCG CAPS capsule Take 1 capsule (290 mcg total) by mouth daily. 90 capsule 3   meclizine  (ANTIVERT ) 12.5 MG tablet Take 12.5 mg by mouth daily.     naloxone  (NARCAN ) 0.4 MG/ML injection Inject 0.4 mg into the skin as needed.     polyethylene glycol powder (GLYCOLAX /MIRALAX ) powder Take 17 g by mouth 2 (two) times daily.  (Patient taking differently: Take 34 g by mouth 2 (two) times daily.) 255 g 0   Semaglutide ,0.25 or 0.5MG /DOS, (OZEMPIC , 0.25 OR 0.5 MG/DOSE,) 2 MG/3ML SOPN Inject 0.5 mg into the skin every Friday. 6 mL 1   senna (SENOKOT) 8.6 MG tablet Take 4 tablets by mouth 2 (two) times daily.     Current Facility-Administered Medications  Medication Dose Route Frequency Provider Last Rate Last Admin   denosumab  (PROLIA ) injection 60 mg  60 mg Subcutaneous Once Matthews, Stephanie L, FNP       Facility-Administered Medications Ordered in Other Visits  Medication Dose Route Frequency Provider Last Rate Last Admin   cyanocobalamin  ((VITAMIN B-12)) injection 1,000 mcg  1,000 mcg Intramuscular Q28 days Khan, Kalsoom, MD        HISTORY OF PRESENT ILLNESS:   Oncology History   No history exists.      REVIEW OF SYSTEMS:   Constitutional: Denies fevers, chills or abnormal weight loss Eyes: Denies blurriness of vision Ears, nose, mouth, throat, and face: Denies mucositis or sore throat Respiratory: Denies cough, dyspnea or wheezes Cardiovascular: Denies palpitation, chest discomfort or lower extremity swelling Gastrointestinal:  Denies nausea, heartburn or change in bowel habits Skin: Denies abnormal skin rashes Lymphatics: Denies new lymphadenopathy or easy bruising Neurological:Denies numbness, tingling or new weaknesses Behavioral/Psych: Mood is stable, no new changes  All other systems were reviewed with the patient and are negative.   VITALS:  Last menstrual period 05/02/2006.  Wt Readings from Last 3 Encounters:  11/21/23 163 lb 3.2 oz (74 kg)  09/04/23 162 lb (73.5 kg)  08/04/23 162 lb 6.4 oz (73.7 kg)    There is no height or weight on file to calculate BMI.  Performance status (ECOG): {CHL ONC [id number]  PHYSICAL EXAM:   GENERAL:alert, no distress and comfortable SKIN: skin color, texture, turgor are normal, no rashes or significant lesions EYES: normal, Conjunctiva are  pink and non-injected, sclera clear OROPHARYNX:no exudate, no erythema and lips, buccal mucosa, and tongue normal  NECK: supple, thyroid  normal size, non-tender, without nodularity LYMPH:  no palpable lymphadenopathy in the cervical, axillary or inguinal LUNGS: clear to auscultation and percussion with normal breathing effort HEART: regular rate & rhythm and no murmurs and no lower extremity edema ABDOMEN:abdomen soft, non-tender and normal bowel sounds Musculoskeletal:no cyanosis of digits and no clubbing  NEURO: alert & oriented x 3 with fluent speech, no focal motor/sensory deficits  LABORATORY DATA:  I have reviewed the data as listed    Component Value Date/Time   NA 141 11/21/2023 1200   NA 142 02/15/2017 1039   K 4.5 11/21/2023 1200   K 4.3 02/15/2017 1039   CL 103 11/21/2023 1200   CO2 31 11/21/2023 1200   CO2 27 02/15/2017 1039   GLUCOSE 141 (H) 11/21/2023 1200   GLUCOSE 180 (H) 02/15/2017 1039   BUN 16 11/21/2023 1200  BUN 13.5 02/15/2017 1039   CREATININE 0.99 11/21/2023 1200   CREATININE 1.09 (H) 09/25/2020 1026   CREATININE 0.9 02/15/2017 1039   CALCIUM  9.6 11/21/2023 1200   CALCIUM  9.2 02/15/2017 1039   PROT 7.6 11/21/2023 1200   PROT 7.0 02/15/2017 1039   ALBUMIN 4.5 11/21/2023 1200   ALBUMIN 3.9 02/15/2017 1039   AST 26 11/21/2023 1200   AST 19 09/25/2020 1026   AST 14 02/15/2017 1039   ALT 20 11/21/2023 1200   ALT 11 09/25/2020 1026   ALT 9 02/15/2017 1039   ALKPHOS 49 11/21/2023 1200   ALKPHOS 92 02/15/2017 1039   BILITOT 0.8 11/21/2023 1200   BILITOT 0.8 09/25/2020 1026   BILITOT 0.64 02/15/2017 1039   GFRNONAA >60 06/28/2023 0338   GFRNONAA 57 (L) 09/25/2020 1026   GFRAA >60 09/26/2019 1053    No results found for: SPEP, UPEP  Lab Results  Component Value Date   WBC 4.8 11/21/2023   NEUTROABS 3.2 11/21/2023   HGB 13.5 11/21/2023   HCT 40.7 11/21/2023   MCV 96.0 11/21/2023   PLT 278.0 11/21/2023      Chemistry      Component  Value Date/Time   NA 141 11/21/2023 1200   NA 142 02/15/2017 1039   K 4.5 11/21/2023 1200   K 4.3 02/15/2017 1039   CL 103 11/21/2023 1200   CO2 31 11/21/2023 1200   CO2 27 02/15/2017 1039   BUN 16 11/21/2023 1200   BUN 13.5 02/15/2017 1039   CREATININE 0.99 11/21/2023 1200   CREATININE 1.09 (H) 09/25/2020 1026   CREATININE 0.9 02/15/2017 1039      Component Value Date/Time   CALCIUM  9.6 11/21/2023 1200   CALCIUM  9.2 02/15/2017 1039   ALKPHOS 49 11/21/2023 1200   ALKPHOS 92 02/15/2017 1039   AST 26 11/21/2023 1200   AST 19 09/25/2020 1026   AST 14 02/15/2017 1039   ALT 20 11/21/2023 1200   ALT 11 09/25/2020 1026   ALT 9 02/15/2017 1039   BILITOT 0.8 11/21/2023 1200   BILITOT 0.8 09/25/2020 1026   BILITOT 0.64 02/15/2017 1039       RADIOGRAPHIC STUDIES: I have personally reviewed the radiological images as listed and agreed with the findings in the report. No results found.

## 2024-02-12 NOTE — Assessment & Plan Note (Deleted)
-  She received monthly vitamin B 12 injections, now at home since 04/2018. Last IV iron infusion was in 10/2018. She is not on iron pills now due to low absorption.

## 2024-02-13 ENCOUNTER — Inpatient Hospital Stay: Payer: PRIVATE HEALTH INSURANCE | Admitting: Nurse Practitioner

## 2024-02-13 ENCOUNTER — Telehealth: Payer: Self-pay

## 2024-02-13 ENCOUNTER — Inpatient Hospital Stay: Payer: PRIVATE HEALTH INSURANCE | Attending: Hematology

## 2024-02-13 DIAGNOSIS — M4800 Spinal stenosis, site unspecified: Secondary | ICD-10-CM | POA: Insufficient documentation

## 2024-02-13 DIAGNOSIS — D509 Iron deficiency anemia, unspecified: Secondary | ICD-10-CM | POA: Insufficient documentation

## 2024-02-13 DIAGNOSIS — D519 Vitamin B12 deficiency anemia, unspecified: Secondary | ICD-10-CM | POA: Insufficient documentation

## 2024-02-13 DIAGNOSIS — D518 Other vitamin B12 deficiency anemias: Secondary | ICD-10-CM

## 2024-02-13 NOTE — Telephone Encounter (Signed)
 Patient did not arrive to her scheduled appointments today. Contacted patient via telephone call. Patient stated she had forgotten about the appointments and requested to reschedule. Transferred patient's call to scheduler.

## 2024-02-15 ENCOUNTER — Telehealth: Payer: Self-pay | Admitting: Nurse Practitioner

## 2024-02-15 NOTE — Telephone Encounter (Signed)
 Jlyn LVM to reschedule her appointment.

## 2024-02-15 NOTE — Telephone Encounter (Signed)
 Kayla Mayo has been re-scheduled for 10/20 per her request.

## 2024-02-18 NOTE — Assessment & Plan Note (Signed)
-  She received monthly vitamin B 12 injections, now at home since 04/2018. Last IV iron infusion was in 10/2018. She is not on iron pills now due to low absorption.  -She received monthly vitamin B 12 injections, now at home since 04/2018. Last IV iron infusion was in 62/2025 after knee surgery.  Does not take oral iron due to low absorption.  No blood transfusions due to Je[employer].  Continue lab checks every 3 months and follow up in 9 months.  -adjust plan as indicated based on possible spinal surgery.

## 2024-02-18 NOTE — Progress Notes (Unsigned)
 Patient Care Team: Alvia Corean CROME, FNP as PCP - General (Family Medicine) [place], Buel Molder, [place] (Optometry)  Clinic Day:  02/19/2024  Referring physician: Alvia Corean CROME, *  ASSESSMENT & PLAN:   Assessment & Plan: Anemia of decreased vitamin B12 absorption -She received monthly vitamin B 12 injections, now at home since 04/2018. Last IV iron infusion was in 10/2018. She is not on iron pills now due to low absorption.  -She received monthly vitamin B 12 injections, now at home since 04/2018. Last IV iron infusion was in 62/2025 after knee surgery.  Does not take oral iron due to low absorption.  No blood transfusions due to Jehovah's Witness.  Continue lab checks every 3 months and follow up in 9 months.  -adjust plan as indicated based on possible spinal surgery.   Iron deficiency anemia Patient reports need for IV iron in 06/2023 after having knee surgery. She does not take blood transfusions as she is Jehovah's Witness. Today, Hgb and Hct are both normal. Iron panel is unremarkable. Ferritin is 175..   Vitamin B12 deficiency Patient takes B12 injections monthly at home. Last check B12 was high. She states that she had just had B12 injection for that month. Prior checks were all low/normal. Advised she continue to take monthly B12 injections.   Spinal stenosis Patient concerned about possible upcoming surgery on her lumbar spine. She meets with the neurosurgeon on 03/12/2024. She states they are considering putting a rod in from L3 to L5 to stabilize her spine. Advised her to update me about pending surgery date. If needed, will adjust lab checks to coincide more closely with surgery. Will arrange for IV iron as indicated.   Plan Labs reviewed. -cbc and iron study both unremarkable.  -ferritin is 175.  -B12 levels pending.  Continue b12 injections at home, monthly. Check labs every 3 months and follow up in 9 months.  Will adjust lab check schedule as indicated based on  possible surgery.   The patient understands the plans discussed today and is in agreement with them.  She knows to contact our office if she develops concerns prior to her next appointment.  I provided 20 minutes of face-to-face time during this encounter and > 50% was spent counseling as documented under my assessment and plan.    Powell FORBES Lessen, NP  [facility] - A DEPT OF [facility] [address] [place] [address] Dept: [phone] Dept Fax: [phone]   No orders of the defined types were placed in this encounter.     CHIEF COMPLAINT:  CC: Anemia due to vitamin B12 deficiency  Current Treatment: Oral iron; B12 injection monthly (given at home); IV iron as needed for ferritin <20  INTERVAL HISTORY:  Kayla Mayo is here today for repeat clinical assessment.  She was last seen by Dr. Lanny on 05/24/2023. She continues to take B12 injections monthly. She reports needing an iron infusion after knee surgery in 06/2023. She may have to have surgery on lumbar spine. Meets with the surgeon on 03/12/2024 to determine need and timing for surgery. 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 She denies chest pain, chest pressure, or shortness of breath. She denies headaches or visual disturbances. She denies abdominal pain, nausea, vomiting, or changes in bowel or bladder habits. She denies fevers or chills. She denies pain. Her appetite is good. Her weight has increased 7 pounds over last  5 months.  I have reviewed the past medical history, past surgical history, social history and family history with the patient and they are unchanged from previous note.  ALLERGIES:  is allergic to morphine  and codeine, blood-group specific substance, codeine, enalapril , lactose intolerance (gi), oxycodone , peanut-containing drug products, and simvastatin .  MEDICATIONS:  Current Outpatient Medications  Medication Sig  Dispense Refill   Continuous Glucose Sensor (FREESTYLE LIBRE 3 PLUS SENSOR) MISC Change sensor every 15 days. 6 each 3   cyanocobalamin  (VITAMIN B12) 1000 MCG/ML injection Inject 1 mL (1,000 mcg total) into the muscle every 2 (two) months. Administration frequency changed to every 2 months. 3 mL 1   denosumab  (PROLIA ) 60 MG/ML SOSY injection Inject 60 mg into the skin every 6 (six) months.     [START ON 04/19/2024] denosumab  (PROLIA ) 60 MG/ML SOSY injection Inject 60 mg into the skin once for 1 dose. 1 mL 0   dicyclomine  (BENTYL ) 20 MG tablet Take 1 tablet (20 mg total) by mouth 3 (three) times daily as needed for spasms (abdominal pain). 90 tablet 3   esomeprazole  (NEXIUM ) 40 MG capsule Take 40 mg by mouth daily at 12 noon.     esomeprazole  (NEXIUM ) 40 MG capsule Take 1 capsule (40 mg total) by mouth daily. 90 capsule 3   ezetimibe  (ZETIA ) 10 MG tablet Take 1 tablet (10 mg total) by mouth daily. 30 tablet 0   fluticasone  (FLONASE ) 50 MCG/ACT nasal spray Place 2 sprays into both nostrils 2 (two) times daily. 16 g 1   gabapentin  (NEURONTIN ) 800 MG tablet Take 1 tablet (800 mg total) by mouth 4 (four) times daily. 120 tablet 1   HYDROcodone -acetaminophen  (NORCO) 10-325 MG tablet Take 1 tablet by mouth.     hydrOXYzine  (ATARAX ) 25 MG tablet Take 1 tablet (25 mg total) by mouth 3 (three) times daily. 270 tablet 1   levothyroxine  (SYNTHROID ) 25 MCG tablet Take 1 tablet (25 mcg total) by mouth at bedtime. 90 tablet 0   Linaclotide  (LINZESS ) 290 MCG CAPS capsule Take 290 mcg by mouth daily.      linaclotide  (LINZESS ) 290 MCG CAPS capsule Take 1 capsule (290 mcg total) by mouth daily. 90 capsule 3   linaclotide  (LINZESS ) 290 MCG CAPS capsule Take 1 capsule (290 mcg total) by mouth daily. 90 capsule 3   meclizine  (ANTIVERT ) 12.5 MG tablet Take 12.5 mg by mouth daily.     meloxicam (MOBIC) 7.5 MG tablet Take 7.5 mg by mouth daily.     naloxone  (NARCAN ) 0.4 MG/ML injection Inject 0.4 mg into the skin as  needed.     polyethylene glycol powder (GLYCOLAX /MIRALAX ) powder Take 17 g by mouth 2 (two) times daily. (Patient taking differently: Take 34 g by mouth 2 (two) times daily.) 255 g 0   Semaglutide ,0.25 or 0.5MG /DOS, (OZEMPIC , 0.25 OR 0.5 MG/DOSE,) 2 MG/3ML SOPN Inject 0.5 mg into the skin every Friday. 6 mL 1   senna (SENOKOT) 8.6 MG tablet Take 4 tablets by mouth 2 (two) times daily.     Current Facility-Administered Medications  Medication Dose Route Frequency Provider Last Rate Last Admin   denosumab  (PROLIA ) injection 60 mg  60 mg Subcutaneous Once Matthews, Stephanie L, FNP       Facility-Administered Medications Ordered in Other Visits  Medication Dose Route Frequency Provider Last Rate Last Admin   cyanocobalamin  ((VITAMIN B-12)) injection 1,000 mcg  1,000 mcg Intramuscular Q28 days Khan, Kalsoom, MD         REVIEW OF SYSTEMS:  Constitutional: Denies fevers, chills or abnormal weight loss Eyes: Denies blurriness of vision Ears, nose, mouth, throat, and face: Denies mucositis or sore throat Respiratory: Denies cough, dyspnea or wheezes Cardiovascular: Denies palpitation, chest discomfort or lower extremity swelling Gastrointestinal:  Denies nausea, heartburn or change in bowel habits Skin: Denies abnormal skin rashes Lymphatics: Denies new lymphadenopathy or easy bruising Neurological:Denies numbness, tingling or new weaknesses Behavioral/Psych: Mood is stable, no new changes  All other systems were reviewed with the patient and are negative.   VITALS:   Today's Vitals   02/19/24 1311 02/19/24 1316  BP: 130/76   Pulse: 81   Resp: 17   Temp: 98.4 F (36.9 C)   SpO2: 99%   Weight: 169 lb 6.4 oz (76.8 kg)   PainSc:  8    Body mass index is 28.19 kg/m.   Wt Readings from Last 3 Encounters:  02/19/24 169 lb 6.4 oz (76.8 kg)  11/21/23 163 lb 3.2 oz (74 kg)  09/04/23 162 lb (73.5 kg)    Body mass index is 28.19 kg/m.  Performance status (ECOG): 1 - Symptomatic  but completely ambulatory  PHYSICAL EXAM:   GENERAL:alert, no distress and comfortable SKIN: skin color, texture, turgor are normal, no rashes or significant lesions EYES: normal, Conjunctiva are pink and non-injected, sclera clear OROPHARYNX:no exudate, no erythema and lips, buccal mucosa, and tongue normal  NECK: supple, thyroid  normal size, non-tender, without nodularity LYMPH:  no palpable lymphadenopathy in the cervical, axillary or inguinal LUNGS: clear to auscultation and percussion with normal breathing effort HEART: regular rate & rhythm and no murmurs and no lower extremity edema ABDOMEN:abdomen soft, non-tender and normal bowel sounds Musculoskeletal:no cyanosis of digits and no clubbing  NEURO: alert & oriented x 3 with fluent speech, no focal motor/sensory deficits  LABORATORY DATA:  I have reviewed the data as listed    Component Value Date/Time   NA 141 11/21/2023 1200   NA 142 02/15/2017 1039   K 4.5 11/21/2023 1200   K 4.3 02/15/2017 1039   CL 103 11/21/2023 1200   CO2 31 11/21/2023 1200   CO2 27 02/15/2017 1039   GLUCOSE 141 (H) 11/21/2023 1200   GLUCOSE 180 (H) 02/15/2017 1039   BUN 16 11/21/2023 1200   BUN 13.5 02/15/2017 1039   CREATININE 0.99 11/21/2023 1200   CREATININE 1.09 (H) 09/25/2020 1026   CREATININE 0.9 02/15/2017 1039   CALCIUM  9.6 11/21/2023 1200   CALCIUM  9.2 02/15/2017 1039   PROT 7.6 11/21/2023 1200   PROT 7.0 02/15/2017 1039   ALBUMIN 4.5 11/21/2023 1200   ALBUMIN 3.9 02/15/2017 1039   AST 26 11/21/2023 1200   AST 19 09/25/2020 1026   AST 14 02/15/2017 1039   ALT 20 11/21/2023 1200   ALT 11 09/25/2020 1026   ALT 9 02/15/2017 1039   ALKPHOS 49 11/21/2023 1200   ALKPHOS 92 02/15/2017 1039   BILITOT 0.8 11/21/2023 1200   BILITOT 0.8 09/25/2020 1026   BILITOT 0.64 02/15/2017 1039   GFRNONAA >60 06/28/2023 0338   GFRNONAA 57 (L) 09/25/2020 1026   GFRAA >60 09/26/2019 1053      Lab Results  Component Value Date   WBC 4.7  02/19/2024   NEUTROABS 2.8 02/19/2024   HGB 12.6 02/19/2024   HCT 38.4 02/19/2024   MCV 95.5 02/19/2024   PLT 236 02/19/2024    Iron/TIBC/Ferritin/ %Sat    Component Value Date/Time   IRON 99 02/19/2024 1254   IRON 139 02/15/2017 1038   TIBC  309 02/19/2024 1254   TIBC 269 02/15/2017 1038   FERRITIN 175 02/19/2024 1254   FERRITIN 140 02/15/2017 1038   IRONPCTSAT 32 (H) 02/19/2024 1254   IRONPCTSAT 52 02/15/2017 1038   IRONPCTSAT 33 06/29/2012 0959

## 2024-02-19 ENCOUNTER — Inpatient Hospital Stay: Admitting: Nurse Practitioner

## 2024-02-19 ENCOUNTER — Encounter: Payer: Self-pay | Admitting: Nurse Practitioner

## 2024-02-19 ENCOUNTER — Inpatient Hospital Stay

## 2024-02-19 VITALS — BP 130/76 | HR 81 | Temp 98.4°F | Resp 17 | Wt 169.4 lb

## 2024-02-19 DIAGNOSIS — D518 Other vitamin B12 deficiency anemias: Secondary | ICD-10-CM | POA: Diagnosis not present

## 2024-02-19 DIAGNOSIS — D510 Vitamin B12 deficiency anemia due to intrinsic factor deficiency: Secondary | ICD-10-CM

## 2024-02-19 DIAGNOSIS — D509 Iron deficiency anemia, unspecified: Secondary | ICD-10-CM | POA: Diagnosis not present

## 2024-02-19 DIAGNOSIS — M4800 Spinal stenosis, site unspecified: Secondary | ICD-10-CM | POA: Diagnosis not present

## 2024-02-19 DIAGNOSIS — D519 Vitamin B12 deficiency anemia, unspecified: Secondary | ICD-10-CM | POA: Diagnosis not present

## 2024-02-19 DIAGNOSIS — D500 Iron deficiency anemia secondary to blood loss (chronic): Secondary | ICD-10-CM

## 2024-02-19 LAB — FERRITIN: Ferritin: 175 ng/mL (ref 11–307)

## 2024-02-19 LAB — CBC WITH DIFFERENTIAL (CANCER CENTER ONLY)
Abs Immature Granulocytes: 0.01 K/uL (ref 0.00–0.07)
Basophils Absolute: 0.1 K/uL (ref 0.0–0.1)
Basophils Relative: 1 %
Eosinophils Absolute: 0.3 K/uL (ref 0.0–0.5)
Eosinophils Relative: 6 %
HCT: 38.4 % (ref 36.0–46.0)
Hemoglobin: 12.6 g/dL (ref 12.0–15.0)
Immature Granulocytes: 0 %
Lymphocytes Relative: 27 %
Lymphs Abs: 1.3 K/uL (ref 0.7–4.0)
MCH: 31.3 pg (ref 26.0–34.0)
MCHC: 32.8 g/dL (ref 30.0–36.0)
MCV: 95.5 fL (ref 80.0–100.0)
Monocytes Absolute: 0.3 K/uL (ref 0.1–1.0)
Monocytes Relative: 7 %
Neutro Abs: 2.8 K/uL (ref 1.7–7.7)
Neutrophils Relative %: 59 %
Platelet Count: 236 K/uL (ref 150–400)
RBC: 4.02 MIL/uL (ref 3.87–5.11)
RDW: 12.7 % (ref 11.5–15.5)
WBC Count: 4.7 K/uL (ref 4.0–10.5)
nRBC: 0 % (ref 0.0–0.2)

## 2024-02-19 LAB — IRON AND IRON BINDING CAPACITY (CC-WL,HP ONLY)
Iron: 99 ug/dL (ref 28–170)
Saturation Ratios: 32 % — ABNORMAL HIGH (ref 10.4–31.8)
TIBC: 309 ug/dL (ref 250–450)
UIBC: 210 ug/dL (ref 148–442)

## 2024-02-19 LAB — VITAMIN B12: Vitamin B-12: 538 pg/mL (ref 180–914)

## 2024-02-20 ENCOUNTER — Other Ambulatory Visit: Payer: Self-pay

## 2024-02-22 ENCOUNTER — Other Ambulatory Visit (HOSPITAL_COMMUNITY): Payer: Self-pay

## 2024-02-23 ENCOUNTER — Other Ambulatory Visit: Payer: Self-pay

## 2024-02-23 ENCOUNTER — Other Ambulatory Visit (HOSPITAL_COMMUNITY): Payer: Self-pay

## 2024-02-24 ENCOUNTER — Other Ambulatory Visit (HOSPITAL_COMMUNITY): Payer: Self-pay

## 2024-02-26 ENCOUNTER — Other Ambulatory Visit (HOSPITAL_BASED_OUTPATIENT_CLINIC_OR_DEPARTMENT_OTHER): Payer: Self-pay

## 2024-02-27 ENCOUNTER — Other Ambulatory Visit (HOSPITAL_COMMUNITY): Payer: Self-pay

## 2024-02-27 ENCOUNTER — Ambulatory Visit (INDEPENDENT_AMBULATORY_CARE_PROVIDER_SITE_OTHER): Admitting: Family Medicine

## 2024-02-27 ENCOUNTER — Encounter: Payer: Self-pay | Admitting: Family Medicine

## 2024-02-27 VITALS — BP 128/70 | HR 76 | Temp 98.2°F | Ht 65.0 in | Wt 169.0 lb

## 2024-02-27 DIAGNOSIS — G8929 Other chronic pain: Secondary | ICD-10-CM | POA: Diagnosis not present

## 2024-02-27 DIAGNOSIS — E782 Mixed hyperlipidemia: Secondary | ICD-10-CM | POA: Diagnosis not present

## 2024-02-27 DIAGNOSIS — M5442 Lumbago with sciatica, left side: Secondary | ICD-10-CM | POA: Diagnosis not present

## 2024-02-27 DIAGNOSIS — E039 Hypothyroidism, unspecified: Secondary | ICD-10-CM | POA: Diagnosis not present

## 2024-02-27 DIAGNOSIS — G720 Drug-induced myopathy: Secondary | ICD-10-CM

## 2024-02-27 DIAGNOSIS — Z7985 Long-term (current) use of injectable non-insulin antidiabetic drugs: Secondary | ICD-10-CM

## 2024-02-27 DIAGNOSIS — D519 Vitamin B12 deficiency anemia, unspecified: Secondary | ICD-10-CM | POA: Diagnosis not present

## 2024-02-27 DIAGNOSIS — Z23 Encounter for immunization: Secondary | ICD-10-CM | POA: Diagnosis not present

## 2024-02-27 DIAGNOSIS — M5441 Lumbago with sciatica, right side: Secondary | ICD-10-CM

## 2024-02-27 DIAGNOSIS — Z79899 Other long term (current) drug therapy: Secondary | ICD-10-CM

## 2024-02-27 DIAGNOSIS — E1143 Type 2 diabetes mellitus with diabetic autonomic (poly)neuropathy: Secondary | ICD-10-CM | POA: Diagnosis not present

## 2024-02-27 DIAGNOSIS — T466X5A Adverse effect of antihyperlipidemic and antiarteriosclerotic drugs, initial encounter: Secondary | ICD-10-CM | POA: Diagnosis not present

## 2024-02-27 LAB — COMPREHENSIVE METABOLIC PANEL WITH GFR
ALT: 18 U/L (ref 0–35)
AST: 22 U/L (ref 0–37)
Albumin: 4 g/dL (ref 3.5–5.2)
Alkaline Phosphatase: 40 U/L (ref 39–117)
BUN: 13 mg/dL (ref 6–23)
CO2: 31 meq/L (ref 19–32)
Calcium: 8.9 mg/dL (ref 8.4–10.5)
Chloride: 102 meq/L (ref 96–112)
Creatinine, Ser: 0.92 mg/dL (ref 0.40–1.20)
GFR: 64.17 mL/min (ref >60.00)
Glucose, Bld: 90 mg/dL (ref 70–99)
Potassium: 3.2 meq/L — ABNORMAL LOW (ref 3.5–5.1)
Sodium: 140 meq/L (ref 135–145)
Total Bilirubin: 0.6 mg/dL (ref 0.2–1.2)
Total Protein: 6.7 g/dL (ref 6.0–8.3)

## 2024-02-27 LAB — TSH: TSH: 1.24 u[IU]/mL (ref 0.35–5.50)

## 2024-02-27 LAB — MICROALBUMIN / CREATININE URINE RATIO
Creatinine,U: 50.8 mg/dL
Microalb Creat Ratio: UNDETERMINED mg/g (ref 0.0–30.0)
Microalb, Ur: <0.7 mg/dL

## 2024-02-27 LAB — LIPID PANEL
Cholesterol: 297 mg/dL — ABNORMAL HIGH (ref 0–200)
HDL: 83.2 mg/dL (ref >39.00)
LDL Cholesterol: 197 mg/dL — ABNORMAL HIGH (ref 0–99)
NonHDL: 213.75
Total CHOL/HDL Ratio: 4
Triglycerides: 86 mg/dL (ref 0.0–149.0)
VLDL: 17.2 mg/dL (ref 0.0–40.0)

## 2024-02-27 LAB — HEMOGLOBIN A1C: Hgb A1c MFr Bld: 5.7 % (ref 4.6–6.5)

## 2024-02-27 MED ORDER — EZETIMIBE 10 MG PO TABS
10.0000 mg | ORAL_TABLET | Freq: Every day | ORAL | 1 refills | Status: DC
  Filled 2024-02-27 – 2024-03-08 (×2): qty 90, 90d supply, fill #0

## 2024-02-27 MED ORDER — HYDROXYZINE HCL 25 MG PO TABS
25.0000 mg | ORAL_TABLET | Freq: Three times a day (TID) | ORAL | 1 refills | Status: DC
  Filled 2024-02-27: qty 270, 90d supply, fill #0

## 2024-02-27 NOTE — Patient Instructions (Addendum)
 We are checking labs today, will be in contact with any results that require further attention.  Refills have been sent to your pharmacy today.  We have given your flu vaccine today.   Follow-up with me in 6 mos for medication management, sooner if needed.

## 2024-02-27 NOTE — Progress Notes (Signed)
 Established Patient Office Visit  Subjective:     Patient ID: Kayla Mayo, female    DOB: [birth year], 68 y.o.   MRN: [id number]  Chief Complaint  Patient presents with   Follow-up    HPI  Discussed the use of AI scribe software for clinical note transcription with the patient, who gave verbal consent to proceed.  History of Present Illness Kayla Mayo is a 68 year old female who presents with back pain and falls.  Back pain and sciatic nerve impingement - Significant back pain attributed to vertebral movement affecting the sciatic nerve - History of back surgery 16 years ago, which was associated with significant pain - Hesitant to consider additional surgical intervention due to prior painful experience  Gait instability and falls - Frequent falls resulting from impaired balance - Balance difficulties are secondary to sciatic nerve involvement and vertebral instability  Metabolic control - Blood sugar levels are well-controlled     ROS Per HPI      Objective:    BP 128/70   Pulse 76   Temp 98.2 F (36.8 C) (Temporal)   Ht 5' 5 (1.651 m)   Wt 169 lb (76.7 kg)   LMP 05/02/2006   SpO2 99%   BMI 28.12 kg/m    Physical Exam Vitals and nursing note reviewed.  Constitutional:      General: She is not in acute distress.    Appearance: Normal appearance. She is normal weight.  HENT:     Head: Normocephalic and atraumatic.     Right Ear: External ear normal.     Left Ear: External ear normal.     Nose: Nose normal.     Mouth/Throat:     Mouth: Mucous membranes are moist.     Pharynx: Oropharynx is clear.  Eyes:     Extraocular Movements: Extraocular movements intact.     Pupils: Pupils are equal, round, and reactive to light.  Cardiovascular:     Rate and Rhythm: Normal rate and regular rhythm.     Pulses: Normal pulses.     Heart sounds: Normal heart sounds.  Pulmonary:     Effort: Pulmonary effort is normal. No respiratory distress.      Breath sounds: Normal breath sounds. No wheezing, rhonchi or rales.  Musculoskeletal:        General: Normal range of motion.     Cervical back: Normal range of motion.     Right lower leg: No edema.     Left lower leg: No edema.  Lymphadenopathy:     Cervical: No cervical adenopathy.  Neurological:     General: No focal deficit present.     Mental Status: She is alert and oriented to person, place, and time.  Psychiatric:        Mood and Affect: Mood normal.        Thought Content: Thought content normal.     Results for orders placed or performed in visit on 02/27/24  Comprehensive metabolic panel with GFR  Result Value Ref Range   Sodium 140 135 - 145 mEq/L   Potassium 3.2 (L) 3.5 - 5.1 mEq/L   Chloride 102 96 - 112 mEq/L   CO2 31 19 - 32 mEq/L   Glucose, Bld 90 70 - 99 mg/dL   BUN 13 6 - 23 mg/dL   Creatinine, Ser 9.07 0.40 - 1.20 mg/dL   Total Bilirubin 0.6 0.2 - 1.2 mg/dL   Alkaline Phosphatase 40 39 - 117 U/L  AST 22 0 - 37 U/L   ALT 18 0 - 35 U/L   Total Protein 6.7 6.0 - 8.3 g/dL   Albumin 4.0 3.5 - 5.2 g/dL   GFR 35.82 >39.99 mL/min   Calcium  8.9 8.4 - 10.5 mg/dL  Hemoglobin J8r  Result Value Ref Range   Hgb A1c MFr Bld 5.7 4.6 - 6.5 %  Lipid panel  Result Value Ref Range   Cholesterol 297 (H) 0 - 200 mg/dL   Triglycerides 13.9 0.0 - 149.0 mg/dL   HDL 16.79 >60.99 mg/dL   VLDL 82.7 0.0 - 59.9 mg/dL   LDL Cholesterol 802 (H) 0 - 99 mg/dL   Total CHOL/HDL Ratio 4    NonHDL 213.75   Microalbumin / creatinine urine ratio  Result Value Ref Range   Microalb, Ur <0.7 mg/dL   Creatinine,U 49.1 mg/dL   Microalb Creat Ratio Unable to calculate 0.0 - 30.0 mg/g  TSH  Result Value Ref Range   TSH 1.24 0.35 - 5.50 uIU/mL    The 10-year ASCVD risk score (Arnett DK, et al., 2019) is: 32.3%  BP Readings from Last 3 Encounters:  02/27/24 128/70  02/19/24 130/76  11/21/23 130/78   Wt Readings from Last 3 Encounters:  02/27/24 169 lb (76.7 kg)  02/19/24  169 lb 6.4 oz (76.8 kg)  11/21/23 163 lb 3.2 oz (74 kg)      Last CBC Lab Results  Component Value Date   WBC 4.7 02/19/2024   HGB 12.6 02/19/2024   HCT 38.4 02/19/2024   MCV 95.5 02/19/2024   MCH 31.3 02/19/2024   RDW 12.7 02/19/2024   PLT 236 02/19/2024   Last metabolic panel Lab Results  Component Value Date   GLUCOSE 90 02/27/2024   NA 140 02/27/2024   K 3.2 (L) 02/27/2024   CL 102 02/27/2024   CO2 31 02/27/2024   BUN 13 02/27/2024   CREATININE 0.92 02/27/2024   GFR 64.17 02/27/2024   CALCIUM  8.9 02/27/2024   PHOS 3.9 11/27/2008   PROT 6.7 02/27/2024   ALBUMIN 4.0 02/27/2024   BILITOT 0.6 02/27/2024   ALKPHOS 40 02/27/2024   AST 22 02/27/2024   ALT 18 02/27/2024   ANIONGAP 8 06/28/2023   Last lipids Lab Results  Component Value Date   CHOL 297 (H) 02/27/2024   HDL 83.20 02/27/2024   LDLCALC 197 (H) 02/27/2024   TRIG 86.0 02/27/2024   CHOLHDL 4 02/27/2024   Last hemoglobin A1c Lab Results  Component Value Date   HGBA1C 5.7 02/27/2024   Last thyroid  functions Lab Results  Component Value Date   TSH 1.24 02/27/2024   Last vitamin D  Lab Results  Component Value Date   VD25OH 43.23 11/21/2023   Last vitamin B12 and Folate Lab Results  Component Value Date   VITAMINB12 538 02/19/2024   FOLATE >20.0 11/25/2008         Assessment & Plan:   Assessment and Plan Assessment & Plan Chronic bilateral low back pain with sciatica, unspecified laterality 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 Considering surgery to insert a rod due to vertebral movement affecting the sciatic nerve, causing pain and balance issues. She is hesitant due to past surgical experience but is considering it if the surgeon can use a side approach instead of the previous incision site. - Discuss surgical options with the surgeon, including the possibility of a side approach. - Consider surgery if symptoms worsen or become unmanageable.  Type 2 diabetes  mellitus with  diabetic autonomic neuropathy, without current long-term use of insulin , long-term use of non-insulin  injectable antidiabetic medication Blood sugar levels are well-controlled.  Anemia due to vitamin B12 deficiency, unspecified type Discussed the need to check blood work to ensure levels are adequate before any surgical procedures. - Check blood work to assess B12 levels.  Mixed hyperlipidemia, statin myopathy Currently taking Zetia  and feels okay with it. Refill for Zetia  has been sent for a 90-day supply. - Continue Zetia  as prescribed. - Ensure 90-day refill of Zetia  is processed.  Acquired hypothyroidism -TSH today, continue levothyroxine   Medication management -Labs today, will dose adjust medications as needed  Immunization due -Flu vaccine given today       Orders Placed This Encounter  Procedures   Flu vaccine HIGH DOSE PF(Fluzone Trivalent)   Comprehensive metabolic panel with GFR    Release to patient:   Immediate [1]   Hemoglobin A1c   Lipid panel   Microalbumin / creatinine urine ratio    Release to patient:   Immediate   TSH     Meds ordered this encounter  Medications   hydrOXYzine  (ATARAX ) 25 MG tablet    Sig: Take 1 tablet (25 mg total) by mouth 3 (three) times daily.    Dispense:  270 tablet    Refill:  1    Please deliver to patient   ezetimibe  (ZETIA ) 10 MG tablet    Sig: Take 1 tablet (10 mg total) by mouth daily.    Dispense:  90 tablet    Refill:  1    Patient prefers mail delivery    Return in about 6 months (around 08/27/2024) for OV, meds.  Corean LITTIE Ku, FNP

## 2024-02-28 ENCOUNTER — Ambulatory Visit: Payer: Self-pay

## 2024-02-28 ENCOUNTER — Telehealth: Payer: Self-pay

## 2024-02-28 DIAGNOSIS — E1143 Type 2 diabetes mellitus with diabetic autonomic (poly)neuropathy: Secondary | ICD-10-CM

## 2024-02-28 DIAGNOSIS — E782 Mixed hyperlipidemia: Secondary | ICD-10-CM

## 2024-02-28 DIAGNOSIS — G720 Drug-induced myopathy: Secondary | ICD-10-CM

## 2024-02-28 NOTE — Telephone Encounter (Signed)
 Forms from [facility] were received on 02/20/2024  and fax back on 02/23/2024

## 2024-03-06 ENCOUNTER — Other Ambulatory Visit (HOSPITAL_COMMUNITY): Payer: Self-pay

## 2024-03-06 ENCOUNTER — Other Ambulatory Visit: Payer: Self-pay

## 2024-03-06 ENCOUNTER — Telehealth: Payer: Self-pay | Admitting: *Deleted

## 2024-03-06 ENCOUNTER — Other Ambulatory Visit: Payer: Self-pay | Admitting: Family Medicine

## 2024-03-06 MED ORDER — LEVOTHYROXINE SODIUM 25 MCG PO TABS
25.0000 ug | ORAL_TABLET | Freq: Every day | ORAL | 0 refills | Status: DC
  Filled 2024-03-06: qty 90, 90d supply, fill #0

## 2024-03-06 NOTE — Progress Notes (Unsigned)
 Care Guide Pharmacy Note  03/06/2024 Name: Kayla Mayo MRN: [id number] DOB: 07/24/1955  Referred By: Alvia Corean CROME, FNP Reason for referral: Call Attempt #1 and Complex Care Management (Outreach to schedule referral with pharmacist )   Kayla Mayo is a 68 y.o. year old female who is a primary care patient of Alvia Corean CROME, FNP.  Kayla Mayo was referred to the pharmacist for assistance related to: DMII  An unsuccessful telephone outreach was attempted today to contact the patient who was referred to the pharmacy team for assistance with medication management. Additional attempts will be made to contact the patient.  Thedford Franks, CMA, Care Guide [facility]  [facility], [facility] Guide Direct Dial: [phone]  Fax: [phone] Website: [email or web address]

## 2024-03-07 NOTE — Progress Notes (Unsigned)
 Care Guide Pharmacy Note  03/07/2024 Name: Kayla Mayo MRN: [id number] DOB: [birth year]  Referred By: Alvia Corean CROME, FNP Reason for referral: Call Attempt #1 and Complex Care Management (Outreach to schedule referral with pharmacist )   Kayla Mayo is a 68 y.o. year old female who is a primary care patient of Alvia Corean CROME, FNP.  Kayla Mayo was referred to the pharmacist for assistance related to: DMII  A second unsuccessful telephone outreach was attempted today to contact the patient who was referred to the pharmacy team for assistance with medication management. Additional attempts will be made to contact the patient.  Thedford Franks, CMA [place]  [facility], [facility] Guide Direct Dial: [phone]  Fax: [phone] Website: [email or web address]

## 2024-03-08 ENCOUNTER — Other Ambulatory Visit (HOSPITAL_COMMUNITY): Payer: Self-pay

## 2024-03-08 ENCOUNTER — Other Ambulatory Visit: Payer: Self-pay

## 2024-03-08 NOTE — Progress Notes (Signed)
 Care Guide Pharmacy Note  03/08/2024 Name: Kayla Mayo MRN: [id number] DOB: 19-May-1955  Referred By: Alvia Corean CROME, FNP Reason for referral: Call Attempt #1 and Complex Care Management (Outreach to schedule referral with pharmacist )   Kayla Mayo is a 68 y.o. year old female who is a primary care patient of Alvia Corean CROME, FNP.  Kayla Mayo was referred to the pharmacist for assistance related to: DMII  A third unsuccessful telephone outreach was attempted today to contact the patient who was referred to the pharmacy team for assistance with medication management. The Population Health team is pleased to engage with this patient at any time in the future upon receipt of referral and should he/she be interested in assistance from the Population Health team.  Thedford Franks, CMA [facility]  [facility], [facility] Guide Direct Dial: [phone]  Fax: [phone] Website: [email or web address]

## 2024-03-12 ENCOUNTER — Telehealth: Payer: Self-pay | Admitting: Nurse Practitioner

## 2024-03-12 NOTE — Telephone Encounter (Signed)
 Called in pt and she is aware of her appt and dates and times.

## 2024-03-17 ENCOUNTER — Other Ambulatory Visit: Payer: Self-pay | Admitting: Family Medicine

## 2024-03-18 ENCOUNTER — Other Ambulatory Visit: Payer: Self-pay

## 2024-03-18 ENCOUNTER — Other Ambulatory Visit (HOSPITAL_COMMUNITY): Payer: Self-pay

## 2024-03-19 ENCOUNTER — Other Ambulatory Visit: Payer: Self-pay

## 2024-03-19 ENCOUNTER — Other Ambulatory Visit: Payer: Self-pay | Admitting: Family Medicine

## 2024-03-19 ENCOUNTER — Other Ambulatory Visit (HOSPITAL_COMMUNITY): Payer: Self-pay

## 2024-03-19 DIAGNOSIS — E1143 Type 2 diabetes mellitus with diabetic autonomic (poly)neuropathy: Secondary | ICD-10-CM

## 2024-03-19 MED ORDER — GABAPENTIN 800 MG PO TABS
800.0000 mg | ORAL_TABLET | Freq: Four times a day (QID) | ORAL | 1 refills | Status: DC
  Filled 2024-03-19: qty 120, 30d supply, fill #0
  Filled 2024-04-17 (×2): qty 120, 30d supply, fill #1

## 2024-03-19 MED ORDER — OZEMPIC (0.25 OR 0.5 MG/DOSE) 2 MG/3ML ~~LOC~~ SOPN
0.5000 mg | PEN_INJECTOR | SUBCUTANEOUS | 1 refills | Status: DC
  Filled 2024-03-19 – 2024-04-17 (×3): qty 6, 56d supply, fill #0

## 2024-03-20 ENCOUNTER — Other Ambulatory Visit: Payer: Self-pay | Admitting: Family Medicine

## 2024-03-20 ENCOUNTER — Other Ambulatory Visit (HOSPITAL_COMMUNITY): Payer: Self-pay

## 2024-03-20 MED ORDER — FREESTYLE LIBRE 3 PLUS SENSOR MISC
3 refills | Status: DC
  Filled 2024-03-20: qty 6, 90d supply, fill #0

## 2024-03-21 ENCOUNTER — Other Ambulatory Visit (HOSPITAL_COMMUNITY): Payer: Self-pay

## 2024-03-21 MED ORDER — FLUTICASONE PROPIONATE 50 MCG/ACT NA SUSP
2.0000 | Freq: Two times a day (BID) | NASAL | 1 refills | Status: DC
  Filled 2024-03-21 – 2024-04-04 (×2): qty 16, 30d supply, fill #0
  Filled 2024-04-23: qty 16, 30d supply, fill #1

## 2024-03-25 ENCOUNTER — Other Ambulatory Visit (HOSPITAL_COMMUNITY): Payer: Self-pay

## 2024-04-02 ENCOUNTER — Other Ambulatory Visit: Payer: Self-pay | Admitting: Family Medicine

## 2024-04-02 ENCOUNTER — Telehealth: Payer: Self-pay

## 2024-04-02 ENCOUNTER — Other Ambulatory Visit: Payer: Self-pay

## 2024-04-02 ENCOUNTER — Other Ambulatory Visit (HOSPITAL_COMMUNITY): Payer: Self-pay

## 2024-04-02 DIAGNOSIS — M8580 Other specified disorders of bone density and structure, unspecified site: Secondary | ICD-10-CM

## 2024-04-02 MED ORDER — PROLIA 60 MG/ML ~~LOC~~ SOSY
60.0000 mg | PREFILLED_SYRINGE | Freq: Once | SUBCUTANEOUS | 0 refills | Status: AC
  Filled 2024-04-02: qty 1, 1d supply, fill #0

## 2024-04-02 NOTE — Telephone Encounter (Signed)
 Patient's insurance no longer covers Prolia . Preferred medication is [name]. Copay is $0. Please send a new prescription to [facility]. Thanks!

## 2024-04-02 NOTE — Progress Notes (Signed)
 Plan no longer covers Prolia . Sent message to provider to request a new prescription for [name] .

## 2024-04-03 ENCOUNTER — Other Ambulatory Visit: Payer: Self-pay

## 2024-04-04 ENCOUNTER — Other Ambulatory Visit (HOSPITAL_COMMUNITY): Payer: Self-pay

## 2024-04-04 ENCOUNTER — Other Ambulatory Visit: Payer: Self-pay

## 2024-04-04 MED ORDER — JUBBONTI 60 MG/ML ~~LOC~~ SOSY
60.0000 mg | PREFILLED_SYRINGE | SUBCUTANEOUS | 1 refills | Status: DC
  Filled 2024-04-04 – 2024-04-11 (×3): qty 1, 180d supply, fill #0

## 2024-04-04 NOTE — Progress Notes (Signed)
 New prescription for [name] on file.

## 2024-04-05 ENCOUNTER — Other Ambulatory Visit: Payer: Self-pay

## 2024-04-09 ENCOUNTER — Other Ambulatory Visit (HOSPITAL_COMMUNITY): Payer: Self-pay

## 2024-04-10 ENCOUNTER — Other Ambulatory Visit: Payer: Self-pay

## 2024-04-11 ENCOUNTER — Other Ambulatory Visit (HOSPITAL_COMMUNITY): Payer: Self-pay

## 2024-04-11 ENCOUNTER — Other Ambulatory Visit: Payer: Self-pay

## 2024-04-11 ENCOUNTER — Telehealth: Payer: Self-pay

## 2024-04-11 DIAGNOSIS — M8580 Other specified disorders of bone density and structure, unspecified site: Secondary | ICD-10-CM

## 2024-04-11 MED ORDER — JUBBONTI 60 MG/ML ~~LOC~~ SOSY
60.0000 mg | PREFILLED_SYRINGE | SUBCUTANEOUS | 0 refills | Status: AC
  Filled 2024-04-11: qty 1, 180d supply, fill #0

## 2024-04-11 NOTE — Telephone Encounter (Signed)
 Copied from CRM #[id number]. Topic: Appointments - Scheduling Inquiry for Clinic >> Apr 11, 2024 11:43 AM Anairis L wrote: Reason for CRM: Patient is calling in to schedule prolia  Injection.

## 2024-04-11 NOTE — Telephone Encounter (Signed)
 Spoke with patient, gave her an update on the medication change and we are awaiting a PA

## 2024-04-11 NOTE — Telephone Encounter (Signed)
 Prolia  BIV in separate encounter.  Please follow CAD Authorization workflow and place a CAM order to initiate Prolia  Benefits investigation. Standard turnaround time is 2 weeks.  Per test claim, [name]  was filled through pharmacy on 04/11/24

## 2024-04-11 NOTE — Telephone Encounter (Addendum)
 No PA required for [name]  through pharmacy benefit. Please see Prolia  BIV in telephone encounter on 04/11/24.

## 2024-04-11 NOTE — Telephone Encounter (Addendum)
 Pt ready for scheduling for [name]  on or after : 04/20/24  Option# 1: Buy/Bill (Office supplied medication)  Out-of-pocket cost due at time of clinic visit: $332  Number of injection/visits approved: ---  Primary: [employer] Prolia  co-insurance: 20% Admin fee co-insurance: 0%  Secondary: --- Prolia  co-insurance:  Admin fee co-insurance:   Medical Benefit Details: Date Benefits were checked: 04/11/24 Deductible: NO/ Coinsurance: 20%/ Admin Fee: 0%  Prior Auth: N/A PA# Expiration Date:   # of doses approved: ----------------------------------------------------------------------- Option# 2- Med Obtained from pharmacy:  Pharmacy benefit: Copay $--- LAST FILLED 04/11/24 (Paid to pharmacy) Admin Fee: 0% (Pay at clinic)  Prior Auth: N/A PA# Expiration Date:   # of doses approved:   If patient wants fill through the pharmacy benefit please send prescription to: [facility], and include estimated need by date in rx notes. Pharmacy will ship medication directly to the office.  Patient NOT eligible for Prolia  Copay Card. Copay Card can make patient's cost as little as $25. Link to apply: [email or web address]://www.amgensupportplus.[email or web address]/copay  ** This summary of benefits is an estimation of the patient's out-of-pocket cost. Exact cost may very based on individual plan coverage.

## 2024-04-12 ENCOUNTER — Other Ambulatory Visit: Payer: Self-pay

## 2024-04-12 ENCOUNTER — Other Ambulatory Visit (HOSPITAL_COMMUNITY): Payer: Self-pay

## 2024-04-12 NOTE — Progress Notes (Signed)
 Specialty Pharmacy Initial Fill Coordination Note  Kayla Mayo is a 69 y.o. female contacted today regarding initial fill of specialty medication(s) Denosumab -bbdz ([name] )   Patient requested Courier to Provider Office   Delivery date: 04/16/24   Verified address:  [facility]  [address]   Medication will be filled on: 04/15/24   Patient is aware of $0 copayment.

## 2024-04-12 NOTE — Telephone Encounter (Signed)
 Patient scheduled.

## 2024-04-15 ENCOUNTER — Other Ambulatory Visit: Payer: Self-pay

## 2024-04-17 ENCOUNTER — Other Ambulatory Visit: Payer: Self-pay

## 2024-04-17 ENCOUNTER — Other Ambulatory Visit (HOSPITAL_COMMUNITY): Payer: Self-pay

## 2024-04-18 ENCOUNTER — Telehealth: Payer: Self-pay

## 2024-04-18 NOTE — Telephone Encounter (Signed)
 Pts Kayla Mayo  is on site in the nurse fridge... $0 co-pay.

## 2024-04-18 NOTE — Telephone Encounter (Signed)
 Pt Kayla Mayo  is on site in the nurse fridge.  Pt has apptmnt on 04/22/24 for her injection.

## 2024-04-22 ENCOUNTER — Ambulatory Visit (INDEPENDENT_AMBULATORY_CARE_PROVIDER_SITE_OTHER)

## 2024-04-22 ENCOUNTER — Other Ambulatory Visit: Payer: Self-pay | Admitting: Medical Genetics

## 2024-04-22 DIAGNOSIS — G622 Polyneuropathy due to other toxic agents: Secondary | ICD-10-CM | POA: Insufficient documentation

## 2024-04-22 DIAGNOSIS — R269 Unspecified abnormalities of gait and mobility: Secondary | ICD-10-CM | POA: Insufficient documentation

## 2024-04-22 DIAGNOSIS — E1142 Type 2 diabetes mellitus with diabetic polyneuropathy: Secondary | ICD-10-CM | POA: Insufficient documentation

## 2024-04-22 DIAGNOSIS — I70203 Unspecified atherosclerosis of native arteries of extremities, bilateral legs: Secondary | ICD-10-CM | POA: Insufficient documentation

## 2024-04-22 DIAGNOSIS — M8580 Other specified disorders of bone density and structure, unspecified site: Secondary | ICD-10-CM

## 2024-04-22 MED ORDER — DENOSUMAB-BBDZ 60 MG/ML ~~LOC~~ SOSY
60.0000 mg | PREFILLED_SYRINGE | Freq: Once | SUBCUTANEOUS | Status: AC
  Administered 2024-04-22: 60 mg via SUBCUTANEOUS

## 2024-04-22 NOTE — Progress Notes (Signed)
 Pt was given [name]  injec given to pt w/o any complications at this time.

## 2024-04-23 ENCOUNTER — Other Ambulatory Visit: Payer: Self-pay

## 2024-04-23 ENCOUNTER — Encounter: Payer: Self-pay | Admitting: Family Medicine

## 2024-04-24 ENCOUNTER — Other Ambulatory Visit: Payer: Self-pay

## 2024-04-24 DIAGNOSIS — J302 Other seasonal allergic rhinitis: Secondary | ICD-10-CM

## 2024-04-24 DIAGNOSIS — E1143 Type 2 diabetes mellitus with diabetic autonomic (poly)neuropathy: Secondary | ICD-10-CM

## 2024-04-24 DIAGNOSIS — G894 Chronic pain syndrome: Secondary | ICD-10-CM

## 2024-04-24 MED ORDER — OZEMPIC (0.25 OR 0.5 MG/DOSE) 2 MG/3ML ~~LOC~~ SOPN: 0.5000 mg | PEN_INJECTOR | SUBCUTANEOUS | 1 refills | Status: AC

## 2024-04-24 MED ORDER — GABAPENTIN 800 MG PO TABS: 800.0000 mg | ORAL_TABLET | Freq: Four times a day (QID) | ORAL | 1 refills | Status: AC

## 2024-04-24 MED ORDER — FLUTICASONE PROPIONATE 50 MCG/ACT NA SUSP: 2.0000 | Freq: Two times a day (BID) | NASAL | 1 refills | Status: AC

## 2024-04-26 ENCOUNTER — Other Ambulatory Visit (HOSPITAL_COMMUNITY): Payer: Self-pay

## 2024-05-03 ENCOUNTER — Other Ambulatory Visit: Payer: Self-pay

## 2024-05-03 DIAGNOSIS — E1143 Type 2 diabetes mellitus with diabetic autonomic (poly)neuropathy: Secondary | ICD-10-CM

## 2024-05-03 MED ORDER — FREESTYLE LIBRE 3 PLUS SENSOR MISC: 3 refills | Status: AC

## 2024-05-10 ENCOUNTER — Telehealth: Payer: Self-pay

## 2024-05-10 NOTE — Telephone Encounter (Signed)
 Copied from CRM [id number]. Topic: Clinical - Medication Prior Auth >> May 10, 2024 10:43 AM Hadassah PARAS wrote: Reason for CRM: Joe from [facility] is calling to obtain PA for Continuous Glucose Sensor (FREESTYLE LIBRE 3 PLUS SENSOR) MISC. Please advise on [phone] reference #[id number].

## 2024-05-14 ENCOUNTER — Encounter: Payer: Self-pay | Admitting: Oncology

## 2024-05-14 ENCOUNTER — Other Ambulatory Visit: Payer: Self-pay

## 2024-05-14 ENCOUNTER — Other Ambulatory Visit (HOSPITAL_COMMUNITY): Payer: Self-pay

## 2024-05-14 ENCOUNTER — Telehealth: Payer: Self-pay

## 2024-05-14 DIAGNOSIS — E1143 Type 2 diabetes mellitus with diabetic autonomic (poly)neuropathy: Secondary | ICD-10-CM

## 2024-05-14 MED ORDER — ACCU-CHEK GUIDE W/DEVICE KIT: 1.0000 | PACK | Freq: Three times a day (TID) | 0 refills | Status: AC

## 2024-05-14 NOTE — Telephone Encounter (Signed)
 Pharmacy Patient Advocate Encounter   Received notification from Physician's Office that prior authorization for FreeStyle Libre 3 Plus Sensor is required/requested.   Insurance verification completed.   The patient is insured through [employer].   Per test claim: PA required; PA started via CoverMyMeds. KEY [id number] . Waiting for clinical questions to populate.

## 2024-05-16 ENCOUNTER — Other Ambulatory Visit: Payer: Self-pay

## 2024-05-16 DIAGNOSIS — M5441 Lumbago with sciatica, right side: Secondary | ICD-10-CM

## 2024-05-16 MED ORDER — BACLOFEN 10 MG PO TABS: 10.0000 mg | ORAL_TABLET | Freq: Two times a day (BID) | ORAL | 0 refills | Status: AC | PRN

## 2024-05-21 NOTE — Telephone Encounter (Signed)
 Stating there is an existing case pending. I will call insurance to see if I can get it resolved.

## 2024-05-22 ENCOUNTER — Encounter: Payer: Self-pay | Admitting: Oncology

## 2024-05-22 NOTE — Telephone Encounter (Signed)
Nope. Perfect. Thank you.

## 2024-05-25 ENCOUNTER — Other Ambulatory Visit: Payer: Self-pay | Admitting: Family Medicine

## 2024-05-25 DIAGNOSIS — J302 Other seasonal allergic rhinitis: Secondary | ICD-10-CM

## 2024-05-29 ENCOUNTER — Other Ambulatory Visit (HOSPITAL_COMMUNITY): Payer: Self-pay

## 2024-05-29 NOTE — Telephone Encounter (Signed)
 Addressed in previous encounter.

## 2024-06-03 ENCOUNTER — Other Ambulatory Visit: Payer: Self-pay

## 2024-06-03 ENCOUNTER — Other Ambulatory Visit: Payer: Self-pay | Admitting: Family Medicine

## 2024-06-03 ENCOUNTER — Other Ambulatory Visit (HOSPITAL_COMMUNITY): Payer: Self-pay

## 2024-06-03 DIAGNOSIS — M5442 Lumbago with sciatica, left side: Secondary | ICD-10-CM

## 2024-06-04 ENCOUNTER — Other Ambulatory Visit: Payer: Self-pay

## 2024-06-04 DIAGNOSIS — E782 Mixed hyperlipidemia: Secondary | ICD-10-CM

## 2024-06-04 DIAGNOSIS — Z79899 Other long term (current) drug therapy: Secondary | ICD-10-CM

## 2024-06-04 MED ORDER — LEVOTHYROXINE SODIUM 25 MCG PO TABS: 25.0000 ug | ORAL_TABLET | Freq: Every day | ORAL | 3 refills | Status: AC

## 2024-06-04 MED ORDER — EZETIMIBE 10 MG PO TABS: 10.0000 mg | ORAL_TABLET | Freq: Every day | ORAL | 3 refills | Status: AC

## 2024-06-07 ENCOUNTER — Other Ambulatory Visit: Payer: Self-pay | Admitting: Family Medicine

## 2024-06-11 ENCOUNTER — Other Ambulatory Visit

## 2024-06-12 ENCOUNTER — Inpatient Hospital Stay: Attending: Hematology

## 2024-09-04 ENCOUNTER — Ambulatory Visit

## 2024-09-09 ENCOUNTER — Inpatient Hospital Stay: Attending: Hematology

## 2024-12-10 ENCOUNTER — Inpatient Hospital Stay

## 2024-12-10 ENCOUNTER — Inpatient Hospital Stay: Admitting: Nurse Practitioner
